# Patient Record
Sex: Female | Born: 1970 | Race: Black or African American | Hispanic: No | Marital: Single | Smoking: Current some day smoker
Health system: Southern US, Community
[De-identification: ages and names within clinical notes are randomized; demographics above are authoritative.]

## PROBLEM LIST (undated history)

## (undated) DIAGNOSIS — Z98891 History of uterine scar from previous surgery: Secondary | ICD-10-CM

## (undated) DIAGNOSIS — Z9851 Tubal ligation status: Secondary | ICD-10-CM

## (undated) DIAGNOSIS — G589 Mononeuropathy, unspecified: Secondary | ICD-10-CM

## (undated) DIAGNOSIS — Z5189 Encounter for other specified aftercare: Secondary | ICD-10-CM

## (undated) DIAGNOSIS — D509 Iron deficiency anemia, unspecified: Secondary | ICD-10-CM

## (undated) DIAGNOSIS — F419 Anxiety disorder, unspecified: Secondary | ICD-10-CM

## (undated) DIAGNOSIS — M19011 Primary osteoarthritis, right shoulder: Secondary | ICD-10-CM

## (undated) DIAGNOSIS — F32A Depression, unspecified: Secondary | ICD-10-CM

## (undated) DIAGNOSIS — F329 Major depressive disorder, single episode, unspecified: Secondary | ICD-10-CM

## (undated) DIAGNOSIS — R51 Headache: Secondary | ICD-10-CM

## (undated) DIAGNOSIS — Z9889 Other specified postprocedural states: Secondary | ICD-10-CM

## (undated) HISTORY — DX: Other specified postprocedural states: Z98.890

## (undated) HISTORY — DX: Primary osteoarthritis, right shoulder: M19.011

## (undated) HISTORY — PX: MRI: SHX5353

## (undated) HISTORY — DX: Headache: R51

## (undated) HISTORY — PX: OTHER SURGICAL HISTORY: SHX169

## (undated) HISTORY — DX: Mononeuropathy, unspecified: G58.9

## (undated) HISTORY — DX: Iron deficiency anemia, unspecified: D50.9

## (undated) HISTORY — PX: SHOULDER SURGERY: SHX246

## (undated) HISTORY — DX: Encounter for other specified aftercare: Z51.89

## (undated) HISTORY — PX: TUBAL LIGATION: SHX77

## (undated) HISTORY — DX: History of uterine scar from previous surgery: Z98.891

## (undated) HISTORY — PX: EYE SURGERY: SHX253

## (undated) HISTORY — DX: Tubal ligation status: Z98.51

---

## 1997-12-28 ENCOUNTER — Emergency Department (HOSPITAL_COMMUNITY): Admission: EM | Admit: 1997-12-28 | Discharge: 1997-12-28 | Payer: Self-pay | Admitting: Emergency Medicine

## 1998-02-11 ENCOUNTER — Emergency Department (HOSPITAL_COMMUNITY): Admission: EM | Admit: 1998-02-11 | Discharge: 1998-02-11 | Payer: Self-pay | Admitting: Emergency Medicine

## 1998-02-12 ENCOUNTER — Inpatient Hospital Stay (HOSPITAL_COMMUNITY): Admission: AD | Admit: 1998-02-12 | Discharge: 1998-02-15 | Payer: Self-pay | Admitting: Emergency Medicine

## 1998-11-24 ENCOUNTER — Encounter: Payer: Self-pay | Admitting: Emergency Medicine

## 1998-11-24 ENCOUNTER — Emergency Department (HOSPITAL_COMMUNITY): Admission: EM | Admit: 1998-11-24 | Discharge: 1998-11-24 | Payer: Self-pay | Admitting: Emergency Medicine

## 1999-07-01 ENCOUNTER — Encounter: Payer: Self-pay | Admitting: Emergency Medicine

## 1999-07-01 ENCOUNTER — Emergency Department (HOSPITAL_COMMUNITY): Admission: EM | Admit: 1999-07-01 | Discharge: 1999-07-01 | Payer: Self-pay | Admitting: Emergency Medicine

## 2000-12-25 ENCOUNTER — Emergency Department (HOSPITAL_COMMUNITY): Admission: EM | Admit: 2000-12-25 | Discharge: 2000-12-25 | Payer: Self-pay | Admitting: Emergency Medicine

## 2000-12-26 ENCOUNTER — Encounter: Payer: Self-pay | Admitting: Emergency Medicine

## 2000-12-26 ENCOUNTER — Emergency Department (HOSPITAL_COMMUNITY): Admission: EM | Admit: 2000-12-26 | Discharge: 2000-12-26 | Payer: Self-pay | Admitting: Emergency Medicine

## 2001-09-22 ENCOUNTER — Inpatient Hospital Stay (HOSPITAL_COMMUNITY): Admission: AD | Admit: 2001-09-22 | Discharge: 2001-09-22 | Payer: Self-pay | Admitting: Obstetrics

## 2002-01-18 ENCOUNTER — Ambulatory Visit (HOSPITAL_COMMUNITY): Admission: RE | Admit: 2002-01-18 | Discharge: 2002-01-18 | Payer: Self-pay | Admitting: Obstetrics

## 2002-01-18 ENCOUNTER — Encounter: Payer: Self-pay | Admitting: Obstetrics

## 2002-03-07 ENCOUNTER — Inpatient Hospital Stay (HOSPITAL_COMMUNITY): Admission: AD | Admit: 2002-03-07 | Discharge: 2002-03-10 | Payer: Self-pay | Admitting: Obstetrics

## 2002-03-07 ENCOUNTER — Encounter (INDEPENDENT_AMBULATORY_CARE_PROVIDER_SITE_OTHER): Payer: Self-pay | Admitting: Specialist

## 2004-01-08 ENCOUNTER — Emergency Department (HOSPITAL_COMMUNITY): Admission: EM | Admit: 2004-01-08 | Discharge: 2004-01-08 | Payer: Self-pay | Admitting: Emergency Medicine

## 2005-09-20 ENCOUNTER — Ambulatory Visit: Payer: Self-pay | Admitting: Family Medicine

## 2005-12-23 ENCOUNTER — Ambulatory Visit: Payer: Self-pay | Admitting: Nurse Practitioner

## 2005-12-26 ENCOUNTER — Ambulatory Visit: Payer: Self-pay | Admitting: Nurse Practitioner

## 2006-07-09 ENCOUNTER — Emergency Department (HOSPITAL_COMMUNITY): Admission: EM | Admit: 2006-07-09 | Discharge: 2006-07-09 | Payer: Self-pay | Admitting: Emergency Medicine

## 2009-07-07 ENCOUNTER — Ambulatory Visit: Payer: Self-pay | Admitting: Internal Medicine

## 2010-02-15 ENCOUNTER — Ambulatory Visit: Payer: Self-pay | Admitting: Internal Medicine

## 2010-02-17 ENCOUNTER — Ambulatory Visit (HOSPITAL_COMMUNITY): Admission: RE | Admit: 2010-02-17 | Discharge: 2010-02-17 | Payer: Self-pay | Admitting: Internal Medicine

## 2010-07-06 ENCOUNTER — Emergency Department (HOSPITAL_COMMUNITY)
Admission: EM | Admit: 2010-07-06 | Discharge: 2010-07-06 | Payer: Self-pay | Source: Home / Self Care | Admitting: Emergency Medicine

## 2010-12-10 NOTE — Discharge Summary (Signed)
   Annette Chang, SALTSMAN                       ACCOUNT NO.:  [id number]   MEDICAL RECORD NO.:  [id number]                   PATIENT TYPE:  INP   LOCATION:  9140                                 FACILITY:  [facility]   PHYSICIAN:  Kathreen Cosier, M.D.           DATE OF BIRTH:  12-15-1970   DATE OF ADMISSION:  03/07/2002  DATE OF DISCHARGE:  03/10/2002                                 DISCHARGE SUMMARY   HISTORY:  The patient is a 40 -year-old gravida 3, para 1-1-0-1 who had a  previous classical C-section and low transverse in the past.  Estimated date  of confinement was 03/25/02. She was admitted contracting cervix 1 cm, 80%  vertex, -3.  She was also treated for depression in the past.  She had a  repeat low transverse cesarean section delivering a 5 pound 10 ounce female  with Apgars 8 and 9.  Tubal ligation was also performed.  On admission her  hemoglobin was 12.1.  Platelets 1743.  Postoperative hemoglobin 11.2.  She  did well and was discharged home on the third postoperative day ambulatory  and a regular diet.   DISCHARGE DIAGNOSIS:  1. Status post previous C-section done at term.  2. Repeat C-section.  3. Tubal ligation.                                               Kathreen Cosier, M.D.    BAM/MEDQ  D:  04/10/2002  T:  04/10/2002  Job:  [id number]

## 2010-12-10 NOTE — Op Note (Signed)
   [name], Annette Chang                         ACCOUNT NO.:  [id number]   MEDICAL RECORD NO.:  [id number]                   PATIENT TYPE:   LOCATION:                                       FACILITY:   PHYSICIAN:  Kathreen Cosier, M.D.           DATE OF BIRTH:   DATE OF PROCEDURE:  03/07/2002  DATE OF DISCHARGE:                                 OPERATIVE REPORT   PREOPERATIVE DIAGNOSES:  Previous classical cesarean section in labor for  repeat cesarean section.   ANESTHESIA:  Spinal.   SURGEON:  Kathreen Cosier, M.D.   PROCEDURE:  After spinal administered patient placed on the operating table  in a supine position.  Abdomen prepped and draped.  Bladder emptied with a  Foley catheter.  A transverse lower uterine incision made through the old  scar, carried down to the rectus fascia.  Fascia cleaned and incised the  length of the incision.  Recti muscles retracted laterally.  Peritoneum  incised longitudinally.  Transverse incision made in the visceroperitoneum  above the bladder and bladder mobilized inferiorly.  The old scar was noted  to be intact.  Transverse incision made in the lower segment.  The fluid was  clear.  The patient delivered a female Apgar 8/9 weighing 5 pounds 10  ounces.  The placenta was anterior, removed manually.  Uterine cavity  cleaned with dry laps.  The uterine incision closed in one layer with  continuous suture of number 1 chromic.  Bladder flap reattached with 2-0  chromic.  The tubes and ovaries were normal.  The right tube was grasped in  the mid portion with a Babcock clamp.  A 0 plain suture placed in the  mesosalpinx below the portion of tube within the clamp.  This was tied and  approximately 1 inch of tube transected.  Procedure done in a similar  fashion on the other side.  Hemostasis satisfactory.  Blood loss 400 cc.  Abdomen closed in layers.  Peritoneum continuous suture of 0 chromic.  Fascia continuous suture of 0 Dexon.  Skin  closed with subcuticular stitch  of 3-0 plain.                                               Kathreen Cosier, M.D.    BAM/MEDQ  D:  05/15/2002  T:  05/15/2002  Job:  [id number]

## 2011-01-07 ENCOUNTER — Other Ambulatory Visit (HOSPITAL_COMMUNITY): Payer: Self-pay | Admitting: Family Medicine

## 2011-01-07 DIAGNOSIS — Z1231 Encounter for screening mammogram for malignant neoplasm of breast: Secondary | ICD-10-CM

## 2011-01-18 ENCOUNTER — Ambulatory Visit (HOSPITAL_COMMUNITY): Payer: Medicaid Other | Attending: Family Medicine

## 2011-12-20 ENCOUNTER — Emergency Department (HOSPITAL_COMMUNITY)
Admission: EM | Admit: 2011-12-20 | Discharge: 2011-12-20 | Disposition: A | Discharge status: Home / Self Care | Payer: Medicaid Other | Attending: Emergency Medicine | Admitting: Emergency Medicine

## 2011-12-20 ENCOUNTER — Encounter (HOSPITAL_COMMUNITY): Payer: Self-pay | Admitting: *Deleted

## 2011-12-20 DIAGNOSIS — R040 Epistaxis: Secondary | ICD-10-CM | POA: Insufficient documentation

## 2011-12-20 DIAGNOSIS — R51 Headache: Secondary | ICD-10-CM

## 2011-12-20 MED ORDER — DIPHENHYDRAMINE HCL 50 MG/ML IJ SOLN
12.5000 mg | Freq: Once | INTRAMUSCULAR | Status: AC
  Administered 2011-12-20: 12.5 mg via INTRAVENOUS
  Filled 2011-12-20: qty 1

## 2011-12-20 MED ORDER — METOCLOPRAMIDE HCL 5 MG/ML IJ SOLN
5.0000 mg | Freq: Once | INTRAMUSCULAR | Status: AC
  Administered 2011-12-20: 5 mg via INTRAVENOUS
  Filled 2011-12-20: qty 2

## 2011-12-20 MED ORDER — SODIUM CHLORIDE 0.9 % IV BOLUS (SEPSIS)
1000.0000 mL | INTRAVENOUS | Status: AC
  Administered 2011-12-20: 1000 mL via INTRAVENOUS

## 2011-12-20 MED ORDER — DEXAMETHASONE SODIUM PHOSPHATE 10 MG/ML IJ SOLN
10.0000 mg | Freq: Once | INTRAMUSCULAR | Status: AC
  Administered 2011-12-20: 10 mg via INTRAVENOUS
  Filled 2011-12-20: qty 1

## 2011-12-20 MED ORDER — ONDANSETRON 8 MG PO TBDP: ORAL_TABLET | ORAL | Status: AC

## 2011-12-20 MED ORDER — BUTALBITAL-APAP-CAFFEINE 50-325-40 MG PO TABS: 1.0000 | ORAL_TABLET | Freq: Four times a day (QID) | ORAL | Status: AC | PRN

## 2011-12-20 MED ORDER — METOCLOPRAMIDE HCL 10 MG PO TABS: 10.0000 mg | ORAL_TABLET | Freq: Four times a day (QID) | ORAL | Status: DC | PRN

## 2011-12-20 NOTE — ED Notes (Signed)
Pt undressed, in gown, on continuous pulse oximetry and blood pressure cuff; family at bedside 

## 2011-12-20 NOTE — ED Provider Notes (Signed)
History     CSN: [id number]  Arrival date & time 12/20/11  1025   None     Chief Complaint  Patient presents with  . Headache  . Epistaxis    (Consider location/radiation/quality/duration/timing/severity/associated sxs/prior treatment) Patient is a 41 y.o. female presenting with headaches and nosebleeds. The history is provided by the patient.  Headache  This is a new problem. Episode onset: 2 weeks ago. The problem occurs constantly. The problem has not changed since onset.The headache is associated with nothing. The pain is located in the frontal region. The quality of the pain is described as throbbing. The pain is moderate. The pain does not radiate. Associated symptoms include vomiting (once). Pertinent negatives include no fever, no shortness of breath and no nausea. Treatments tried: motrin. The treatment provided mild relief.  Epistaxis     History reviewed. No pertinent past medical history.  Past Surgical History  Procedure Date  . Shoulder surgery     No family history on file.  History  Substance Use Topics  . Smoking status: Current Everyday Smoker  . Smokeless tobacco: Not on file  . Alcohol Use: Yes     occ    OB History    Grav Para Term Preterm Abortions TAB SAB Ect Mult Living                  Review of Systems  Constitutional: Positive for chills. Negative for fever and fatigue.       Sweats  HENT: Positive for nosebleeds. Negative for congestion, drooling and neck pain.   Eyes: Negative for pain.  Respiratory: Negative for cough and shortness of breath.   Cardiovascular: Negative for chest pain.  Gastrointestinal: Positive for vomiting (once). Negative for nausea, abdominal pain and diarrhea.  Genitourinary: Negative for dysuria and hematuria.  Musculoskeletal: Negative for back pain and gait problem.  Skin: Negative for color change.  Neurological: Positive for headaches. Negative for dizziness.  Hematological: Negative for adenopathy.    Psychiatric/Behavioral: Negative for behavioral problems.  All other systems reviewed and are negative.    Allergies  Percocet  Home Medications   Current Outpatient Rx  Name Route Sig Dispense Refill  . ACETAMINOPHEN 500 MG PO TABS Oral Take 500 mg by mouth every 6 (six) hours as needed. For headache    . IBUPROFEN 800 MG PO TABS Oral Take 800 mg by mouth every 8 (eight) hours as needed. For headache      BP 124/92  Pulse 75  Temp(Src) 98.5 F (36.9 C) (Oral)  Resp 18  SpO2 98%  Physical Exam  Constitutional: She is oriented to person, place, and time. She appears well-developed and well-nourished.  HENT:  Head: Normocephalic.  Mouth/Throat: No oropharyngeal exudate.  Eyes: Conjunctivae and EOM are normal. Pupils are equal, round, and reactive to light.  Neck: Normal range of motion. Neck supple.       No nuchal rigidity. Negative kernigs/brudzinski signs.  Cardiovascular: Normal rate, regular rhythm, normal heart sounds and intact distal pulses.  Exam reveals no gallop and no friction rub.   No murmur heard. Pulmonary/Chest: Effort normal and breath sounds normal. No respiratory distress. She has no wheezes.  Abdominal: Soft. Bowel sounds are normal. There is no tenderness.  Musculoskeletal: Normal range of motion. She exhibits no edema and no tenderness.  Neurological: She is alert and oriented to person, place, and time. She has normal strength. No cranial nerve deficit or sensory deficit. She displays a negative Romberg sign. Coordination  and gait normal.       Mild dizziness w/ ambulation.  Skin: Skin is warm and dry.  Psychiatric: She has a normal mood and affect. Her behavior is normal.    ED Course  Procedures (including critical care time)  Labs Reviewed - No data to display No results found.   No diagnosis found.    MDM  12:00 PM 41 y.o. female pw HA x 2 weeks and nosebleed 2-3x. Pt notes chills and sweats, but denies fever. Pt has had emesis x1. Pt  AFVSS here, appears well on exam, neurologically intact, no meningeal signs. Will give HA cocktail.   Pt notes that HA has dramatically decreased, now 5/10.  She continues to appear well.  I have discussed the diagnosis/risks/treatment options with the patient and believe the pt to be eligible for discharge home to follow-up with pcp for her HA's. We also discussed returning to the ED immediately if new or worsening sx occur. We discussed the sx which are most concerning (e.g., fever, worsening HA) that necessitate immediate return. Any new prescriptions provided to the patient are listed below.  New Prescriptions   BUTALBITAL-ACETAMINOPHEN-CAFFEINE (FIORICET) 50-325-40 MG PER TABLET    Take 1 tablet by mouth every 6 (six) hours as needed for headache.   METOCLOPRAMIDE (REGLAN) 10 MG TABLET    Take 1 tablet (10 mg total) by mouth every 6 (six) hours as needed (nausea/headache).   ONDANSETRON (ZOFRAN ODT) 8 MG DISINTEGRATING TABLET    8mg  ODT q4 hours prn nausea     Clinical Impression 1. Headache          Purvis Sheffield, MD 12/20/11 1640

## 2011-12-20 NOTE — ED Provider Notes (Signed)
Feels much better, no sudden onset, no fever or trauma, denies focal neuro Sxs.  I saw and evaluated the patient, reviewed the resident's note and I agree with the findings and plan.  Hurman Horn, MD 12/21/11 [phone]

## 2011-12-20 NOTE — ED Notes (Signed)
Pt used phone and reconnected back to monitor, continuous pulse ox and blood pressure.

## 2011-12-20 NOTE — ED Notes (Signed)
Got pt an extra blanket for comfort.

## 2011-12-20 NOTE — Discharge Instructions (Signed)
 RESOURCE GUIDE  Dental Problems  Patients with Medicaid: [facility]                     [facility] [address]                                           [address] Phone:  [phone]                                                  Phone:  [phone]  If unable to pay or uninsured, contact:  [facility] or [facility] to become qualified for the adult dental clinic.  Chronic Pain Problems Contact [facility]  [phone] Patients need to be referred by their primary care doctor.  Insufficient Money for Medicine Contact [employer]:  call "211" or [facility] 271-5999.  No Primary Care Doctor Call [facility]  832-8000 Other agencies that provide inexpensive medical care    [facility]  832-8035    [facility]  [phone]    [facility]  [phone]    [facility] Clinic  [phone]    [facility]  [phone]    [facility]  [phone]  Psychological Services [facility]  [phone] [facility]  [phone] [facility]   800 853-5163 (emergency services 641-4993)  Substance Abuse Resources [facility]  336-882-2125 [facility] 336-784-9470 [facility] 336-285-9073 [facility] 336-845-3988 Residential & Outpatient Substance Abuse Program  800-659-3381  Abuse/Neglect [place] Child Abuse Hotline (336) 641-3795 [place] Child Abuse Hotline 800-378-5315 (After Hours)  Emergency Shelter [facility] (336) 271-5985  M[facility] (336) 275-9566 [facility] (704) 372-4663  MRSA Hotline #:   832-7006    [place] [facility]  [facility] [place]     [employer]                          [facility] [address] [place]                       [address]      [address]                                                  [place]                            [place] Phone:  [phone]                                   Phone:  [phone]                 Phone:  [phone]  [facility] Phone:    [phone]  [place] Child Abuse Hotline (336) 342-1394 (336) 342-3537 (After Hours)   

## 2011-12-20 NOTE — ED Notes (Signed)
Pt is here with headaches for 2 weeks.  Pt having night sweats.  Pt reports nose bleeds with headaches..  Pt reports intermittent dizziness. Pt reports fatigue

## 2012-01-02 ENCOUNTER — Ambulatory Visit (INDEPENDENT_AMBULATORY_CARE_PROVIDER_SITE_OTHER): Payer: Medicaid Other | Admitting: Family Medicine

## 2012-01-02 ENCOUNTER — Encounter: Payer: Self-pay | Admitting: Family Medicine

## 2012-01-02 VITALS — BP 130/84 | HR 69 | Ht 61.0 in | Wt 95.8 lb

## 2012-01-02 DIAGNOSIS — M19011 Primary osteoarthritis, right shoulder: Secondary | ICD-10-CM

## 2012-01-02 DIAGNOSIS — Z9889 Other specified postprocedural states: Secondary | ICD-10-CM

## 2012-01-02 DIAGNOSIS — M19019 Primary osteoarthritis, unspecified shoulder: Secondary | ICD-10-CM

## 2012-01-02 DIAGNOSIS — F329 Major depressive disorder, single episode, unspecified: Secondary | ICD-10-CM

## 2012-01-02 DIAGNOSIS — Z23 Encounter for immunization: Secondary | ICD-10-CM

## 2012-01-02 DIAGNOSIS — R51 Headache: Secondary | ICD-10-CM

## 2012-01-02 DIAGNOSIS — D509 Iron deficiency anemia, unspecified: Secondary | ICD-10-CM

## 2012-01-02 DIAGNOSIS — Z9851 Tubal ligation status: Secondary | ICD-10-CM

## 2012-01-02 DIAGNOSIS — Z98891 History of uterine scar from previous surgery: Secondary | ICD-10-CM

## 2012-01-02 DIAGNOSIS — R519 Headache, unspecified: Secondary | ICD-10-CM | POA: Insufficient documentation

## 2012-01-02 DIAGNOSIS — Z1239 Encounter for other screening for malignant neoplasm of breast: Secondary | ICD-10-CM

## 2012-01-02 DIAGNOSIS — F32A Depression, unspecified: Secondary | ICD-10-CM

## 2012-01-02 DIAGNOSIS — R61 Generalized hyperhidrosis: Secondary | ICD-10-CM

## 2012-01-02 HISTORY — DX: History of uterine scar from previous surgery: Z98.891

## 2012-01-02 HISTORY — DX: Iron deficiency anemia, unspecified: D50.9

## 2012-01-02 HISTORY — DX: Other specified postprocedural states: Z98.890

## 2012-01-02 HISTORY — DX: Primary osteoarthritis, right shoulder: M19.011

## 2012-01-02 HISTORY — DX: Tubal ligation status: Z98.51

## 2012-01-02 HISTORY — DX: Headache: R51

## 2012-01-02 NOTE — Assessment & Plan Note (Signed)
Will discuss this next visit. Recently went to ER because of this and was attributed to HTN, although her pressure was not extremely elevated.

## 2012-01-02 NOTE — Assessment & Plan Note (Signed)
History of surgery, every now and then she gets pain in this area.

## 2012-01-02 NOTE — Progress Notes (Signed)
  Subjective:   Patient ID: Annette Chang, female DOB: [birth year] 41 y.o. MRN: [id number] HPI: New patient C/o Headache  1. Headaches. Course: worsening Synopsis: patient developed a headache two weeks ago that she attributed to HTN. She was seen in the ED at that time and was given pain medication that helped. She has normal pressure today.  Plan is to discuss this problem next visit.   Patient Active Problem List  Diagnoses  . Headache  . H/O rotator cuff surgery  . Arthritis of shoulder region, right  . H/O: C-section  . Iron deficiency anemia  . H/O tubal ligation  . Depression   2. Depression PHQ-9 of 7. Mainly moodiness and excess sleep. She snaps at her children for no reason.  Does not have energy.  No hx of depression.  History  Substance Use Topics  . Smoking status: Current Everyday Smoker  . Smokeless tobacco: Not on file  . Alcohol Use: Yes     occ    Review of Systems: Pertinent items are noted in HPI.  Labs Reviewed: yes Reviewed Chart Review for last notes.     Objective:   Filed Vitals:   01/02/12 1559  BP: 130/84  Pulse: 69  Height: 5\' 1"  (1.549 m)  Weight: 95 lb 12.8 oz (43.455 kg)   Physical Exam: General: aaf, skinny, smiling, pleasant Lungs:  Normal respiratory effort, chest expands symmetrically. Lungs are clear to auscultation, no crackles or wheezes. Heart - Regular rate and rhythm.  No murmurs, gallops or rubs.    Abdomen: soft and non-tender without masses, organomegaly or hernias noted.  No guarding or rebound Extremities:   Non-tender, No cyanosis, edema, or deformity noted. Back - Normal skin, Spine with normal alignment and no deformity.  No tenderness to vertebral process palpation.  Paraspinous muscles are not tender and without spasm.   Range of motion is full at neck and lumbar sacral regions Skin:  Intact without suspicious lesions or rashes Assessment & Plan:

## 2012-01-02 NOTE — Patient Instructions (Signed)
Nice to meet you today.  I will see you again in two weeks to discuss your headaches and to do a PAP smear.

## 2012-01-02 NOTE — Assessment & Plan Note (Signed)
Obtaining a CBC. Patient states she has a history of anemia.

## 2012-01-02 NOTE — Assessment & Plan Note (Signed)
7 on her PHQ - mild depression.  Will follow this. She may benefit from an SSRI

## 2012-01-03 LAB — BASIC METABOLIC PANEL
BUN: 8 mg/dL (ref 6–23)
Calcium: 9.6 mg/dL (ref 8.4–10.5)
Creat: 0.64 mg/dL (ref 0.50–1.10)
Glucose, Bld: 89 mg/dL (ref 70–99)
Potassium: 3.6 mEq/L (ref 3.5–5.3)

## 2012-01-03 LAB — CBC
Platelets: 211 10*3/uL (ref 150–400)
RBC: 4.2 MIL/uL (ref 3.87–5.11)
RDW: 13.9 % (ref 11.5–15.5)
WBC: 4.5 10*3/uL (ref 4.0–10.5)

## 2012-01-03 LAB — TSH: TSH: 1.576 u[IU]/mL (ref 0.350–4.500)

## 2012-01-18 ENCOUNTER — Ambulatory Visit: Payer: Medicaid Other | Admitting: Family Medicine

## 2012-02-06 ENCOUNTER — Ambulatory Visit: Payer: Medicaid Other | Admitting: Family Medicine

## 2012-02-07 ENCOUNTER — Ambulatory Visit: Payer: Medicaid Other

## 2012-03-05 ENCOUNTER — Encounter: Payer: Medicaid Other | Admitting: Family Medicine

## 2012-05-03 ENCOUNTER — Encounter: Payer: Self-pay | Admitting: Family Medicine

## 2012-05-03 ENCOUNTER — Other Ambulatory Visit (HOSPITAL_COMMUNITY)
Admission: RE | Admit: 2012-05-03 | Discharge: 2012-05-03 | Disposition: A | Discharge status: Home / Self Care | Payer: Medicaid Other | Source: Ambulatory Visit | Attending: Family Medicine | Admitting: Family Medicine

## 2012-05-03 ENCOUNTER — Ambulatory Visit (INDEPENDENT_AMBULATORY_CARE_PROVIDER_SITE_OTHER): Payer: Medicaid Other | Admitting: Family Medicine

## 2012-05-03 VITALS — BP 124/76 | HR 70 | Ht 61.0 in | Wt 96.2 lb

## 2012-05-03 DIAGNOSIS — Z124 Encounter for screening for malignant neoplasm of cervix: Secondary | ICD-10-CM

## 2012-05-03 DIAGNOSIS — M21619 Bunion of unspecified foot: Secondary | ICD-10-CM

## 2012-05-03 DIAGNOSIS — Z01419 Encounter for gynecological examination (general) (routine) without abnormal findings: Secondary | ICD-10-CM | POA: Insufficient documentation

## 2012-05-03 DIAGNOSIS — N920 Excessive and frequent menstruation with regular cycle: Secondary | ICD-10-CM

## 2012-05-03 NOTE — Progress Notes (Signed)
  Subjective:     Annette Chang is a 41 y.o. woman who comes in today for a  pap smear only. Her most recent annual exam was about 10 years ago.  She is a new patient and we do not have records of last pap smear.  It was performed when she was pregnant about 10 years ago.  Contraception: tubal ligation  Bunion: located on both feet.  At times, they will become red, swollen, and painful when she wears tight shoes.  Patient starting a new job and would like them shaved down.  Her mother has had the procedure done before.  She can bear weight on it.  Denies any associated fever, chills, N/V.  Review of Systems Per HPI  Objective:    BP 124/76  Pulse 70  Ht 5\' 1"  (1.549 m)  Wt 96 lb 3.2 oz (43.636 kg)  BMI 18.18 kg/m2 Pelvic Exam: cervix normal in appearance, external genitalia normal, no adnexal masses or tenderness, no cervical motion tenderness and vagina normal without discharge. Pap smear obtained.   Assessment:    Screening pap smear.   Plan:   See Problem List.   Follow up in 1 year for annual CPE, or as indicated by Pap results.

## 2012-05-03 NOTE — Assessment & Plan Note (Signed)
Will refer to podiatry for possible removal.  Advised patient to purchase comfortable shoes to keep pressure off bunion and to take NSAIDS as needed for pain/swelling.

## 2012-05-03 NOTE — Assessment & Plan Note (Signed)
Pap performed today. 

## 2012-05-07 ENCOUNTER — Encounter: Payer: Self-pay | Admitting: Obstetrics & Gynecology

## 2012-05-09 ENCOUNTER — Encounter: Payer: Self-pay | Admitting: Family Medicine

## 2012-05-10 ENCOUNTER — Telehealth: Payer: Self-pay | Admitting: Family Medicine

## 2012-05-10 MED ORDER — METRONIDAZOLE 500 MG PO TABS: 500.0000 mg | ORAL_TABLET | Freq: Three times a day (TID) | ORAL | Status: DC

## 2012-05-10 NOTE — Telephone Encounter (Signed)
Pt's pap was normal however she is positive for trichomonas. Will forward to pcp to find out plan for treatment prior to calling pt back.Laureen Ochs, Viann Shove

## 2012-05-10 NOTE — Telephone Encounter (Signed)
Patient is calling for results of Pap Smear.  She said that if she doesn't answer, it is ok to leave the results on her answering machine.

## 2012-05-10 NOTE — Telephone Encounter (Signed)
I sent Flagyl to her pharmacy.  Thanks for letting me know, Tonya.

## 2012-05-14 NOTE — Telephone Encounter (Signed)
Called to inform pt of POS Trichomonas however pt was unavailable so I left a message with Angelique Blonder her neighbors daughter for her to call our office back. (did not tell her of pt's results)    Should pt call our office back please inform her of the following:  She tested POS for Trichomonas. Dr.de Lawson Radar sent in an Rx for Flagyl to her pharmacy. She will need to take the full course of antibiotic  She will need to inform her partner so that they can get tested and treated NO SEX for 7 days.  Laureen Ochs, Viann Shove

## 2012-05-15 NOTE — Telephone Encounter (Signed)
Called pt and spoke with Alona Bene (Pt's neighbor) she stated that her daughter had given her the message to call our office back. She informed me that the pt recently had surgery and was not able to get out of her home. She told me that she will give her the message again and hopefully when someone goes by to check on her they have a cell phone so that pt can call back. I will forward this information to her pcp.Loralee Pacas [place]

## 2012-05-28 ENCOUNTER — Encounter: Payer: Medicaid Other | Admitting: Obstetrics & Gynecology

## 2012-09-03 ENCOUNTER — Ambulatory Visit: Payer: Medicaid Other | Admitting: Family Medicine

## 2012-09-12 ENCOUNTER — Ambulatory Visit: Payer: Medicaid Other | Admitting: Family Medicine

## 2012-10-10 ENCOUNTER — Ambulatory Visit (INDEPENDENT_AMBULATORY_CARE_PROVIDER_SITE_OTHER): Payer: Medicaid Other | Admitting: Family Medicine

## 2012-10-10 ENCOUNTER — Encounter: Payer: Self-pay | Admitting: Family Medicine

## 2012-10-10 VITALS — BP 109/71 | HR 60 | Temp 98.2°F | Ht 61.0 in | Wt 96.1 lb

## 2012-10-10 DIAGNOSIS — Z1239 Encounter for other screening for malignant neoplasm of breast: Secondary | ICD-10-CM

## 2012-10-10 DIAGNOSIS — N63 Unspecified lump in unspecified breast: Secondary | ICD-10-CM

## 2012-10-10 NOTE — Patient Instructions (Addendum)
Please call [facility] and schedule mammogram at your earliest convenience. Continue to do self breast exams in the shower once per month.

## 2012-10-10 NOTE — Addendum Note (Signed)
Addended by: Damita Lack on: 10/10/2012 02:53 PM   Modules accepted: Orders

## 2012-10-10 NOTE — Assessment & Plan Note (Addendum)
Needs new referral for screening mammogram.  Low risk patient.  No palpable mass found on physical exam today.  Patient to call [facility] for appointment.

## 2012-10-10 NOTE — Progress Notes (Signed)
  Subjective:    Patient ID: Annette Chang, female    DOB: 02/20/1971, 42 y.o.   MRN: [id number]  HPI  Patient here for referral for mammogram (screening). She has felt a knot on left breast in the past, but she says it comes and goes. Knot is painful intermittently, mostly when she lays on it at night.  No first degree family hx of breast cancer. She smokes cigarettes infrequently (one cigarette socially on weekends). Menarche: 42 years old; she is a mother of 2 children.  Denies any fever, chills, NS, nausea/vomiting.  Review of Systems Per HPI    Objective:   Physical Exam  Constitutional: She appears well-nourished. No distress.  Pulmonary/Chest: Right breast exhibits no mass, no nipple discharge, no skin change and no tenderness. Left breast exhibits no mass, no nipple discharge, no skin change and no tenderness. Breasts are asymmetrical.  No masses, cysts, or abscess palpated on breast exam today.     Assessment & Plan:

## 2012-10-30 ENCOUNTER — Ambulatory Visit
Admission: RE | Admit: 2012-10-30 | Discharge: 2012-10-30 | Disposition: A | Discharge status: Home / Self Care | Payer: Self-pay | Source: Ambulatory Visit | Attending: Family Medicine | Admitting: Family Medicine

## 2012-10-30 ENCOUNTER — Ambulatory Visit: Payer: Medicaid Other

## 2012-10-30 DIAGNOSIS — N63 Unspecified lump in unspecified breast: Secondary | ICD-10-CM

## 2012-10-30 DIAGNOSIS — Z1239 Encounter for other screening for malignant neoplasm of breast: Secondary | ICD-10-CM

## 2012-10-31 ENCOUNTER — Telehealth (HOSPITAL_COMMUNITY): Payer: Self-pay | Admitting: *Deleted

## 2012-10-31 NOTE — Telephone Encounter (Signed)
Telephoned patient at home # and left message to return call to [facility] 

## 2012-11-05 ENCOUNTER — Other Ambulatory Visit: Payer: Self-pay | Admitting: *Deleted

## 2012-11-05 DIAGNOSIS — N63 Unspecified lump in unspecified breast: Secondary | ICD-10-CM

## 2012-11-06 ENCOUNTER — Encounter (HOSPITAL_COMMUNITY): Payer: Self-pay

## 2012-11-06 ENCOUNTER — Ambulatory Visit (HOSPITAL_COMMUNITY)
Admission: RE | Admit: 2012-11-06 | Discharge: 2012-11-06 | Disposition: A | Discharge status: Home / Self Care | Payer: Self-pay | Source: Ambulatory Visit | Attending: Obstetrics and Gynecology | Admitting: Obstetrics and Gynecology

## 2012-11-06 VITALS — BP 106/64 | Temp 99.0°F | Ht 61.0 in | Wt 95.2 lb

## 2012-11-06 DIAGNOSIS — N644 Mastodynia: Secondary | ICD-10-CM | POA: Insufficient documentation

## 2012-11-06 DIAGNOSIS — Z1239 Encounter for other screening for malignant neoplasm of breast: Secondary | ICD-10-CM

## 2012-11-06 NOTE — Patient Instructions (Addendum)
Taught patient how to perform BSE. Patient did not need a Pap smear today due to last Pap smear was 05/03/2012. Let her know [facility] will cover Pap smears every 3 years unless has a history of abnormal Pap smears.Told patient about free cervical cancer screenings to receive a Pap smear if would like one next year. Referred patient to the [facility] for bilateral diagnostic mammogram and ultrasound. Appointment scheduled for Monday, November 12, 2012 at 1020. Patient aware of appointment and will be there. Patient verbalized understanding.

## 2012-11-06 NOTE — Progress Notes (Signed)
Complaints of bilateral outer breast pain that is great within the left breast. Patient stated pain comes and goes. Patient stated pain is sharp when touches and when lays down. Patient rated pain at a 7 out of 10. Patient complained of lumps in bilateral breasts.  Pap Smear:    Pap smear not completed today. Last Pap smear was 05/03/2012 at [facility] and normal showing trichomonas vaginalis. Per patient has a history of an abnormal Pap smear around 27 years ago that is unsure if and what follow was completed. Last Pap smear result is in EPIC.  Physical exam: Breasts Breasts symmetrical. No skin abnormalities bilateral breasts. No nipple retraction bilateral breasts. No nipple discharge bilateral breasts. No lymphadenopathy. No lumps palpated bilateral breasts.  Complaints of bilateral outer breast pain that is greater in the left breast. Referred patient to the [facility] for bilateral diagnostic mammogram and ultrasound. Appointment scheduled for Monday, November 12, 2012 at 1020.     Pelvic/Bimanual No Pap smear completed today since last Pap smear was 05/03/2012. Pap smear not indicated per [facility] guidelines.

## 2012-11-12 ENCOUNTER — Ambulatory Visit
Admission: RE | Admit: 2012-11-12 | Discharge: 2012-11-12 | Disposition: A | Discharge status: Home / Self Care | Payer: No Typology Code available for payment source | Source: Ambulatory Visit | Attending: Family Medicine | Admitting: Family Medicine

## 2012-11-12 DIAGNOSIS — Z1239 Encounter for other screening for malignant neoplasm of breast: Secondary | ICD-10-CM

## 2012-11-12 DIAGNOSIS — N63 Unspecified lump in unspecified breast: Secondary | ICD-10-CM

## 2012-11-30 ENCOUNTER — Ambulatory Visit: Payer: Self-pay | Admitting: Family Medicine

## 2013-02-04 ENCOUNTER — Telehealth: Payer: Self-pay | Admitting: Family Medicine

## 2013-02-06 NOTE — Telephone Encounter (Signed)
error 

## 2013-02-12 ENCOUNTER — Encounter: Payer: Self-pay | Admitting: Family Medicine

## 2013-02-12 ENCOUNTER — Ambulatory Visit (INDEPENDENT_AMBULATORY_CARE_PROVIDER_SITE_OTHER): Payer: Medicaid Other | Admitting: Family Medicine

## 2013-02-12 VITALS — BP 109/76 | HR 81 | Ht 61.0 in | Wt 93.0 lb

## 2013-02-12 DIAGNOSIS — N644 Mastodynia: Secondary | ICD-10-CM

## 2013-02-12 DIAGNOSIS — N920 Excessive and frequent menstruation with regular cycle: Secondary | ICD-10-CM

## 2013-02-12 NOTE — Assessment & Plan Note (Signed)
Mammogram came back normal so U/S was cancelled. The only advice was follow up with yearly mammogram and consider 3-D mammogram.

## 2013-02-12 NOTE — Progress Notes (Signed)
Patient ID: Annette Chang, female   DOB: 08/20/1970, 42 y.o.   MRN: [id number] [facility] Clare Gandy, MD Phone: [phone]  Subjective:   F/U to mammogram:  She said that she received a letter about her mammogram that stated she has a 60% chance of developing cancer. The nurse called the facility where her mammogram was done and they never sent such a letter. Her mammogram was normal so a ultrasound was cancelled. The only advice they gave was yearly mammogram and considering a 3-D mammogram.   Menorrhagia  She asked for a referral for a partial hysterectomy. She started menses at the age of 34. She has menses monthly that can last from a week to two weeks. She can go through a whole box when at its worst. She has taken ibuprofen for pain but has not tried anything else. Her mother had a partial hysterectomy and her aunts have heavy menses.    ROS--See HPI  Past Medical History Reviewed problem list.  Medications- reviewed and updated Chief complaint-noted  Objective: BP 109/76  Pulse 81  Ht 5\' 1"  (1.549 m)  Wt 42.185 kg (93 lb)  BMI 17.58 kg/m2 Gen: NAD, resting comfortably CV: RRR no murmurs rubs or gallops Lungs: CTAB no crackles, wheeze, rhonchi Skin: warm, dry Neuro: grossly normal, moves all extremities  Assessment/Plan:  Menorrhagia: She left before I came back into the room.  -CBC and Urine pregnancy test - future orders - Try naproxen three times a day, start the NSAID the day before menses is to begin and then take it the first three days of menses. I want to try this for 2-3 cycles  - I also want to her to visit Dr[facility] for possible endometrial biopsy  - If NSAID doesn't work then we can try some birth control or an IUD  - if all this fails then we can refer for surgery.

## 2013-02-12 NOTE — Patient Instructions (Addendum)
Nice to meet you.   I would like to try some NSAIDS before you immediately consider surgery. I want to try naproxen three times a day. I want you to start it the day before the onset of menstruation and take it the first three days of menstruation. I want to try this for at least 3 cycles. If this does not work then I want to try oral contraceptives or an IUD. We can also refer you to Dr. Donnetta H[facility] women's/Gyn clinic in the family medicine office where they can further investigate your heavy bleeding.   Thank you and nice to meet you.

## 2013-02-20 ENCOUNTER — Telehealth: Payer: Self-pay | Admitting: Family Medicine

## 2013-02-20 NOTE — Telephone Encounter (Signed)
Pt is requesting  Status update on her referral for GYN. JW

## 2013-02-20 NOTE — Telephone Encounter (Signed)
In my progress note from her last clinic visit, I laid out the plan in order for her to try different options before being sent directly to GYN for surgery.

## 2013-02-21 NOTE — Telephone Encounter (Signed)
Pt informed of Dr. Jordan Likes plan and scheduled 03/07/13 at 11:30 in colpo clinic.  Pt verbalized understanding.  Haddie Bruhl, Darlyne Russian, CMA

## 2013-03-07 ENCOUNTER — Ambulatory Visit: Payer: Medicaid Other

## 2013-08-09 ENCOUNTER — Ambulatory Visit (INDEPENDENT_AMBULATORY_CARE_PROVIDER_SITE_OTHER): Payer: Medicaid Other | Admitting: Family Medicine

## 2013-08-09 ENCOUNTER — Encounter: Payer: Self-pay | Admitting: Family Medicine

## 2013-08-09 VITALS — BP 110/60 | HR 76 | Temp 98.6°F | Ht 61.0 in | Wt 92.8 lb

## 2013-08-09 DIAGNOSIS — N926 Irregular menstruation, unspecified: Secondary | ICD-10-CM

## 2013-08-09 DIAGNOSIS — R5381 Other malaise: Secondary | ICD-10-CM

## 2013-08-09 DIAGNOSIS — R5383 Other fatigue: Principal | ICD-10-CM

## 2013-08-09 DIAGNOSIS — N920 Excessive and frequent menstruation with regular cycle: Secondary | ICD-10-CM

## 2013-08-09 LAB — POCT GLYCOSYLATED HEMOGLOBIN (HGB A1C): HEMOGLOBIN A1C: 5

## 2013-08-09 LAB — CBC
HEMATOCRIT: 29.8 % — AB (ref 36.0–46.0)
HEMOGLOBIN: 10.1 g/dL — AB (ref 12.0–15.0)
MCH: 28.6 pg (ref 26.0–34.0)
MCHC: 33.9 g/dL (ref 30.0–36.0)
MCV: 84.4 fL (ref 78.0–100.0)
Platelets: 223 10*3/uL (ref 150–400)
RBC: 3.53 MIL/uL — ABNORMAL LOW (ref 3.87–5.11)
RDW: 15 % (ref 11.5–15.5)
WBC: 3.8 10*3/uL — ABNORMAL LOW (ref 4.0–10.5)

## 2013-08-09 LAB — POCT URINE PREGNANCY: PREG TEST UR: NEGATIVE

## 2013-08-09 NOTE — Patient Instructions (Signed)
Thank you for coming in,   I put in for a transvaginal ultrasound and a pelvic ultrasound. These two tests will look for anything causing your irregular bleeding. I will call you with these results as well as the lab results.   We will decide the course based on the results above.    Please feel free to call with any questions or concerns at any time, at [phone]. --Dr. Raeford Razor

## 2013-08-09 NOTE — Progress Notes (Signed)
    Subjective:     Patient ID: Annette Chang, female   DOB: 03/16/1971, 43 y.o.   MRN: [id number]  HPI Ms. Schuler is presenting for an annual check up.   Reports menorrhagia all of her life. She reports having irregular menses. She just finished her last menstrual cycle the day prior to appointment.  She has cycles that last 2-4 weeks. She will use 5-6 pads per day. She has always had these types of menses. She started her menses at 43 year of age. She reports all the females in her family have experienced cycles such as this. As of late she has been feeling more tired. She hasn't tried anything to date and only takes fish oil.  She has no excessive bruising or epistaxis. She does have palpitations and feels an intolerance to heat. The other women in her family have had partial hysterectomies.   Review of Systems All other systems reviewed and otherwise normal.      Objective:   Physical Exam BP 110/60  Pulse 76  Temp(Src) 98.6 F (37 C) (Oral)  Ht 5\' 1"  (1.549 m)  Wt 92 lb 12.8 oz (42.094 kg)  BMI 17.54 kg/m2  LMP 07/25/2013 Gen: NAD, alert, thin, African American female, cooperative with exam CV: RRR, good S1/S2, no murmur Resp: CTABL, no wheezes, non-labored Abd: SNTND, BS present, no guarding or organomegaly Ext: No edema, warm, Cap refill brisk      Assessment:         Plan:

## 2013-08-10 LAB — TSH: TSH: 0.294 u[IU]/mL — ABNORMAL LOW (ref 0.350–4.500)

## 2013-08-11 ENCOUNTER — Telehealth: Payer: Self-pay | Admitting: Family Medicine

## 2013-08-11 DIAGNOSIS — E0590 Thyrotoxicosis, unspecified without thyrotoxic crisis or storm: Secondary | ICD-10-CM

## 2013-08-11 DIAGNOSIS — D649 Anemia, unspecified: Secondary | ICD-10-CM

## 2013-08-11 NOTE — Telephone Encounter (Signed)
Patient had low TSH. Will call and put future lab draws of Free T4 and T3 for further work up of hyperthyroidism.

## 2013-08-12 NOTE — Telephone Encounter (Signed)
Spoke with female. Patient is very sick and was informed of below message

## 2013-08-13 ENCOUNTER — Encounter: Payer: Self-pay | Admitting: Family Medicine

## 2013-08-13 ENCOUNTER — Ambulatory Visit (HOSPITAL_COMMUNITY)
Admission: RE | Admit: 2013-08-13 | Discharge: 2013-08-13 | Disposition: A | Discharge status: Home / Self Care | Payer: Medicaid Other | Source: Ambulatory Visit | Attending: Family Medicine | Admitting: Family Medicine

## 2013-08-13 DIAGNOSIS — N938 Other specified abnormal uterine and vaginal bleeding: Secondary | ICD-10-CM | POA: Insufficient documentation

## 2013-08-13 DIAGNOSIS — N949 Unspecified condition associated with female genital organs and menstrual cycle: Secondary | ICD-10-CM | POA: Insufficient documentation

## 2013-08-13 DIAGNOSIS — N920 Excessive and frequent menstruation with regular cycle: Secondary | ICD-10-CM | POA: Insufficient documentation

## 2013-08-13 DIAGNOSIS — N925 Other specified irregular menstruation: Secondary | ICD-10-CM | POA: Insufficient documentation

## 2013-08-13 DIAGNOSIS — R5381 Other malaise: Secondary | ICD-10-CM | POA: Insufficient documentation

## 2013-08-13 DIAGNOSIS — D251 Intramural leiomyoma of uterus: Secondary | ICD-10-CM | POA: Insufficient documentation

## 2013-08-13 DIAGNOSIS — R5383 Other fatigue: Principal | ICD-10-CM

## 2013-08-13 NOTE — Assessment & Plan Note (Signed)
Has been feeling fatigue as of late. May be due to her menorrhagia.  - CBC, TSH, work up pending lab results.

## 2013-08-13 NOTE — Assessment & Plan Note (Signed)
Will evaluate for excessive bleeding. Transvaginal u/s and pelvic u/s. Appears to have a family pattern. She would like a partial hysterectomy. Will complete evaluation until referral is warranted.

## 2013-08-16 ENCOUNTER — Telehealth: Payer: Self-pay | Admitting: *Deleted

## 2013-08-16 NOTE — Telephone Encounter (Signed)
Message copied by Johny Shears on Fri Aug 16, 2013  8:49 AM ------      Message from: Clearance Coots E      Created: Thu Aug 15, 2013  9:51 PM       Please call Ms. Annette Chang and let her know that her transvaginal ultrasound and pelvic ultrasound were normal. There were no findings to explain her heavy periods.  I will refer her to an OB for further evaluation. Is there anyone that she prefers to go to? ------

## 2013-08-16 NOTE — Telephone Encounter (Signed)
LVM for patient to call back. ?

## 2013-08-19 NOTE — Telephone Encounter (Signed)
Spoke with patient's mother and gave her information. ok'ed by patient in office visit

## 2013-08-22 ENCOUNTER — Ambulatory Visit: Payer: Medicaid Other | Admitting: Family Medicine

## 2013-11-02 ENCOUNTER — Encounter (HOSPITAL_COMMUNITY): Payer: Self-pay | Admitting: *Deleted

## 2013-11-02 ENCOUNTER — Inpatient Hospital Stay (HOSPITAL_COMMUNITY)
Admission: AD | Admit: 2013-11-02 | Discharge: 2013-11-02 | Disposition: A | Discharge status: Home / Self Care | Payer: Medicaid Other | Source: Ambulatory Visit | Attending: Obstetrics & Gynecology | Admitting: Obstetrics & Gynecology

## 2013-11-02 DIAGNOSIS — N390 Urinary tract infection, site not specified: Secondary | ICD-10-CM | POA: Insufficient documentation

## 2013-11-02 DIAGNOSIS — Z9851 Tubal ligation status: Secondary | ICD-10-CM

## 2013-11-02 DIAGNOSIS — N920 Excessive and frequent menstruation with regular cycle: Secondary | ICD-10-CM | POA: Insufficient documentation

## 2013-11-02 HISTORY — DX: Depression, unspecified: F32.A

## 2013-11-02 HISTORY — DX: Major depressive disorder, single episode, unspecified: F32.9

## 2013-11-02 LAB — CBC
HCT: 29.2 % — ABNORMAL LOW (ref 36.0–46.0)
HEMOGLOBIN: 9.9 g/dL — AB (ref 12.0–15.0)
MCH: 28.9 pg (ref 26.0–34.0)
MCHC: 33.9 g/dL (ref 30.0–36.0)
MCV: 85.4 fL (ref 78.0–100.0)
Platelets: 210 10*3/uL (ref 150–400)
RBC: 3.42 MIL/uL — AB (ref 3.87–5.11)
RDW: 14.2 % (ref 11.5–15.5)
WBC: 3.8 10*3/uL — ABNORMAL LOW (ref 4.0–10.5)

## 2013-11-02 LAB — URINE MICROSCOPIC-ADD ON

## 2013-11-02 LAB — URINALYSIS, ROUTINE W REFLEX MICROSCOPIC
BILIRUBIN URINE: NEGATIVE
Glucose, UA: 100 mg/dL — AB
Ketones, ur: 40 mg/dL — AB
NITRITE: POSITIVE — AB
PH: 5 (ref 5.0–8.0)
Protein, ur: >300 mg/dL — AB
SPECIFIC GRAVITY, URINE: 1.02 (ref 1.005–1.030)
Urobilinogen, UA: >8 mg/dL — ABNORMAL HIGH (ref 0.0–1.0)

## 2013-11-02 MED ORDER — MEGESTROL ACETATE 40 MG PO TABS: ORAL_TABLET | ORAL | Status: DC

## 2013-11-02 MED ORDER — SULFAMETHOXAZOLE-TMP DS 800-160 MG PO TABS: 1.0000 | ORAL_TABLET | Freq: Two times a day (BID) | ORAL | Status: DC

## 2013-11-02 NOTE — MAU Note (Addendum)
States she has been bleeding heavily with clots for 2 weeks. States she has a hx of abnormal bleeding and has received blood transfusions. Has appointment with [facility] Tuesday but didn't feel she could wait. States she has not had good follow-up with new doctor. States she was told she had something small on one of her ovaries.

## 2013-11-02 NOTE — MAU Provider Note (Signed)
Chief Complaint: Vaginal Bleeding    SUBJECTIVE HPI: Annette Chang is a 43 y.o. [id number] who presents with heavy vaginal bleeding with clots for 2 weeks. Denies dysuria.  States she has a hx of abnormal bleeding and has received blood transfusions. Patient has an appointment with [facility] Tuesday at 4pm, but didn't feel she could wait. States she has not had good follow-up with new doctor (states did not get a call to go over her results).  Wants a hysterectomy.  Previous ultrasound (1/15) shows a small fibroid (<1cm).   Past Medical History  Diagnosis Date  . 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 H/O rotator cuff surgery 01/02/2012  . H/O tubal ligation 01/02/2012  . H/O: C-section 01/02/2012    3   . Headache(784.0) 01/02/2012  . Iron deficiency anemia 01/02/2012  . H/O: C-section 01/02/2012    3   . Depression    OB History  Gravida Para Term Preterm AB SAB TAB Ectopic Multiple Living  3 3 3       2     # Outcome Date GA Lbr Len/2nd Weight Sex Delivery Anes PTL Lv  3 TRM           2 TRM           1 TRM              Past Surgical History  Procedure Laterality Date  . Shoulder surgery    . Cesarean section      3 previous c sections  . Rotator cuff surgery    . Tubal ligation     History   Social History  . Marital Status: Single    Spouse Name: N/A    Number of Children: N/A  . Years of Education: N/A   Occupational History  . Not on file.   Social History Main Topics  . Smoking status: Never Smoker   . Smokeless tobacco: Never Used  . Alcohol Use: Yes     Comment: weekends  . Drug Use: No  . Sexual Activity: Not Currently    Birth Control/ Protection: Surgical   Other Topics Concern  . Not on file   Social History Narrative  . No narrative on file   No current facility-administered medications on file prior to encounter.   Current Outpatient Prescriptions on File Prior to Encounter  Medication Sig Dispense Refill  . acetaminophen (TYLENOL) 500  MG tablet Take 500 mg by mouth every 6 (six) hours as needed. For headache       Allergies  Allergen Reactions  . Percocet [Oxycodone-Acetaminophen] Itching    ROS: Pertinent items in HPI  OBJECTIVE Blood pressure 119/74, pulse 78, temperature 98.3 F (36.8 C), temperature source Oral, resp. rate 18, height 5\' 1"  (1.549 m), weight 42.094 kg (92 lb 12.8 oz), last menstrual period 10/27/2013. GENERAL: Well-developed, well-nourished female in no acute distress.  HEENT: Normocephalic HEART: normal rate RESP: normal effort ABDOMEN: Soft, non-tender EXTREMITIES: Nontender, no edema NEURO: Alert and oriented SPECULUM EXAM: Vaginal bleeding with tiny visible clot at os,, no active bleeding now; cervix is nulliparous and swollen, NEFG  BIMANUAL:Cervix is non-tender; uterus feels enlarged, no adnexal tenderness or masses  LAB RESULTS Results for orders placed during the hospital encounter of 11/02/13 (from the past 24 hour(s))  URINALYSIS, ROUTINE W REFLEX MICROSCOPIC     Status: Abnormal   Collection Time    11/02/13 10:48 AM      Result Value Ref Range  Color, Urine RED (*) YELLOW   APPearance TURBID (*) CLEAR   Specific Gravity, Urine 1.020  1.005 - 1.030   pH 5.0  5.0 - 8.0   Glucose, UA 100 (*) NEGATIVE mg/dL   Hgb urine dipstick LARGE (*) NEGATIVE   Bilirubin Urine NEGATIVE  NEGATIVE   Ketones, ur 40 (*) NEGATIVE mg/dL   Protein, ur >300 (*) NEGATIVE mg/dL   Urobilinogen, UA >8.0 (*) 0.0 - 1.0 mg/dL   Nitrite POSITIVE (*) NEGATIVE   Leukocytes, UA MODERATE (*) NEGATIVE  URINE MICROSCOPIC-ADD ON     Status: Abnormal   Collection Time    11/02/13 10:48 AM      Result Value Ref Range   Squamous Epithelial / LPF FEW (*) RARE   WBC, UA 3-6  <3 WBC/hpf   RBC / HPF TOO NUMEROUS TO COUNT  <3 RBC/hpf   Bacteria, UA RARE  RARE   Urine-Other MUCOUS PRESENT    CBC     Status: Abnormal   Collection Time    11/02/13 11:12 AM      Result Value Ref Range   WBC 3.8 (*) 4.0 - 10.5  K/uL   RBC 3.42 (*) 3.87 - 5.11 MIL/uL   Hemoglobin 9.9 (*) 12.0 - 15.0 g/dL   HCT 29.2 (*) 36.0 - 46.0 %   MCV 85.4  78.0 - 100.0 fL   MCH 28.9  26.0 - 34.0 pg   MCHC 33.9  30.0 - 36.0 g/dL   RDW 14.2  11.5 - 15.5 %   Platelets 210  150 - 400 K/uL    IMAGING No results found.  MAU COURSE Speculum exam  Bimanual exam  ASSESSMENT:   Menorrhagia UTI  PLAN Discharge home in stable condition Keep next scheduled visit with [facility] on Tuesday     Follow-up Information   Follow up with [facility] In 2 days. (Keep scheduled appt for 5/13 at 4pm)    Contact information:   [address] [place] [address] [phone]       Medication List         acetaminophen 500 MG tablet  Commonly known as:  TYLENOL  Take 500 mg by mouth every 6 (six) hours as needed. For headache     calcium-vitamin D 500-200 MG-UNIT per tablet  Take 1 tablet by mouth daily.     FISH OIL PO  Take 1 capsule by mouth daily.     megestrol 40 MG tablet  Commonly known as:  MEGACE  Take 3/day (at the same time) for 5 days; 2/day for 5 days, then 1/day PO prn bleeding     multivitamin-iron-minerals-folic acid chewable tablet  Chew 1 tablet by mouth daily.     sulfamethoxazole-trimethoprim 800-160 MG per tablet  Commonly known as:  BACTRIM DS  Take 1 tablet by mouth 2 (two) times daily.     vitamin C 500 MG tablet  Commonly known as:  ASCORBIC ACID  Take 500 mg by mouth daily.         Christin Fudge, CNM 11/02/2013  2:44 PM

## 2013-11-02 NOTE — MAU Provider Note (Signed)
Attestation of Attending Supervision of Advanced Practitioner (PA/CNM/NP): Evaluation and management procedures were performed by the Advanced Practitioner under my supervision and collaboration.  I have reviewed the Advanced Practitioner's note and chart, and I agree with the management and plan.  Jarrah Babich, MD, FACOG Attending Obstetrician & Gynecologist Faculty Practice, [facility]  

## 2013-11-04 LAB — POCT PREGNANCY, URINE: Preg Test, Ur: NEGATIVE

## 2013-11-05 ENCOUNTER — Encounter: Payer: Self-pay | Admitting: Family Medicine

## 2013-11-05 ENCOUNTER — Ambulatory Visit (INDEPENDENT_AMBULATORY_CARE_PROVIDER_SITE_OTHER): Payer: Medicaid Other | Admitting: Family Medicine

## 2013-11-05 VITALS — BP 111/63 | HR 81 | Ht 61.0 in | Wt 95.7 lb

## 2013-11-05 DIAGNOSIS — E0590 Thyrotoxicosis, unspecified without thyrotoxic crisis or storm: Secondary | ICD-10-CM

## 2013-11-05 DIAGNOSIS — D649 Anemia, unspecified: Secondary | ICD-10-CM

## 2013-11-05 DIAGNOSIS — N920 Excessive and frequent menstruation with regular cycle: Secondary | ICD-10-CM

## 2013-11-05 NOTE — Patient Instructions (Signed)
Thank you for coming in,   I put in for a referral to OB/Gyn. You should get a call within a week to set up a time.   I will call you with the results of your lab tests today.    Please feel free to call with any questions or concerns at any time, at [phone]. --Dr. Raeford Razor

## 2013-11-06 ENCOUNTER — Encounter: Payer: Self-pay | Admitting: Family Medicine

## 2013-11-06 LAB — T4, FREE: FREE T4: 1.08 ng/dL (ref 0.80–1.80)

## 2013-11-06 LAB — ANEMIA PANEL
%SAT: 3 % — AB (ref 20–55)
ABS Retic: 57.3 10*3/uL (ref 19.0–186.0)
FERRITIN: 8 ng/mL — AB (ref 10–291)
FOLATE: 17.1 ng/mL
Iron: 13 ug/dL — ABNORMAL LOW (ref 42–145)
RBC.: 3.37 MIL/uL — ABNORMAL LOW (ref 3.87–5.11)
Retic Ct Pct: 1.7 % (ref 0.4–2.3)
TIBC: 445 ug/dL (ref 250–470)
UIBC: 432 ug/dL — ABNORMAL HIGH (ref 125–400)
Vitamin B-12: 427 pg/mL (ref 211–911)

## 2013-11-06 LAB — CBC
HCT: 28.1 % — ABNORMAL LOW (ref 36.0–46.0)
Hemoglobin: 9.7 g/dL — ABNORMAL LOW (ref 12.0–15.0)
MCH: 28.6 pg (ref 26.0–34.0)
MCHC: 34.5 g/dL (ref 30.0–36.0)
MCV: 82.9 fL (ref 78.0–100.0)
Platelets: 267 10*3/uL (ref 150–400)
RBC: 3.39 MIL/uL — ABNORMAL LOW (ref 3.87–5.11)
RDW: 14.8 % (ref 11.5–15.5)
WBC: 5.5 10*3/uL (ref 4.0–10.5)

## 2013-11-06 LAB — T3, FREE: T3, Free: 3.2 pg/mL (ref 2.3–4.2)

## 2013-11-06 NOTE — Assessment & Plan Note (Signed)
Currently controlled with megace. Imaging has not shown an indication for her bleeding  - Will refer to [facility]  - Discussed with Dr. McDiarmid.

## 2013-11-06 NOTE — Progress Notes (Signed)
   Subjective:    Patient ID: Annette Chang, female    DOB: [birth year], 43 y.o.   MRN: [id number]  HPI Annette Chang is here for follow up for her excessive bleeding and recently been seen in the MAU at [facility] hospital.  Patient has been followed for her excessive bleeding. She had a transvaginal u/s that showed a small fibroid but no other indication for her bleeding. She was having clots forming and wearing a pad. She went to the MAU and was prescribed Megace. Since starting this medication she hasn't bled. She would still like to have the option of an endometrial ablation or a partial hysterectomy.    Current Outpatient Prescriptions on File Prior to Visit  Medication Sig Dispense Refill  . acetaminophen (TYLENOL) 500 MG tablet Take 500 mg by mouth every 6 (six) hours as needed. For headache      . Calcium Carbonate-Vitamin D (CALCIUM-VITAMIN D) 500-200 MG-UNIT per tablet Take 1 tablet by mouth daily.      . megestrol (MEGACE) 40 MG tablet Take 3/day (at the same time) for 5 days; 2/day for 5 days, then 1/day PO prn bleeding  60 tablet  3  . multivitamin-iron-minerals-folic acid (CENTRUM) chewable tablet Chew 1 tablet by mouth daily.      . Omega-3 Fatty Acids (FISH OIL PO) Take 1 capsule by mouth daily.      Marland Kitchen sulfamethoxazole-trimethoprim (BACTRIM DS) 800-160 MG per tablet Take 1 tablet by mouth 2 (two) times daily.  10 tablet  0  . vitamin C (ASCORBIC ACID) 500 MG tablet Take 500 mg by mouth daily.       No current facility-administered medications on file prior to visit.    Review of Systems See HPI     Objective:   Physical Exam BP 111/63  Pulse 81  Ht 5\' 1"  (1.549 m)  Wt 95 lb 11.2 oz (43.409 kg)  BMI 18.09 kg/m2  LMP 10/27/2013 Gen: NAD, alert, cooperative with exam, well-appearing, African American female  Skin: no rashes, normal turgor  Neuro: no gross deficits.  Psych: good insight, alert and oriented      Assessment & Plan:

## 2013-12-12 ENCOUNTER — Other Ambulatory Visit (HOSPITAL_COMMUNITY)
Admission: RE | Admit: 2013-12-12 | Discharge: 2013-12-12 | Disposition: A | Discharge status: Home / Self Care | Payer: Medicaid Other | Source: Ambulatory Visit | Attending: Obstetrics and Gynecology | Admitting: Obstetrics and Gynecology

## 2013-12-12 ENCOUNTER — Encounter: Payer: Self-pay | Admitting: Obstetrics and Gynecology

## 2013-12-12 ENCOUNTER — Other Ambulatory Visit (HOSPITAL_COMMUNITY)
Admission: RE | Admit: 2013-12-12 | Discharge: 2013-12-12 | Disposition: A | Discharge status: Home / Self Care | Payer: Medicaid Other | Source: Ambulatory Visit | Attending: Family Medicine | Admitting: Family Medicine

## 2013-12-12 ENCOUNTER — Ambulatory Visit (INDEPENDENT_AMBULATORY_CARE_PROVIDER_SITE_OTHER): Payer: Medicaid Other | Admitting: Obstetrics and Gynecology

## 2013-12-12 VITALS — BP 97/64 | HR 90 | Temp 98.4°F | Ht 61.0 in | Wt 95.9 lb

## 2013-12-12 DIAGNOSIS — N920 Excessive and frequent menstruation with regular cycle: Secondary | ICD-10-CM | POA: Insufficient documentation

## 2013-12-12 DIAGNOSIS — Z124 Encounter for screening for malignant neoplasm of cervix: Secondary | ICD-10-CM

## 2013-12-12 DIAGNOSIS — Z01812 Encounter for preprocedural laboratory examination: Secondary | ICD-10-CM

## 2013-12-12 DIAGNOSIS — Z1151 Encounter for screening for human papillomavirus (HPV): Secondary | ICD-10-CM | POA: Insufficient documentation

## 2013-12-12 DIAGNOSIS — Z01419 Encounter for gynecological examination (general) (routine) without abnormal findings: Secondary | ICD-10-CM | POA: Insufficient documentation

## 2013-12-12 LAB — POCT PREGNANCY, URINE: PREG TEST UR: NEGATIVE

## 2013-12-12 NOTE — Progress Notes (Signed)
CC: Menorrhagia    HPI Annette Chang is a 43 y.o. [id number]  who presents with longstanding menorrhagia. She reports regular monthly menses and  heavy flow with clots lasting 2 wks. She has been bleeding more heavily and nearly continuously for the past 4 months. Bleeding stopped after taking Megace prescribed in MAU 11/02/2013. Hgb was 9.9 then. She had pelvic US 08/09/2013 revealing small <1cm fibroid and ES 3 mm. TSH .29, T3 and T4 normal. She would like definitive tx: ablation or preferably hysterectomy.  Last Pap normal 1 year ago. Yearly mammograms normal. PCP at [facility].    Past Medical History  Diagnosis Date  . 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 H/O rotator cuff surgery 01/02/2012  . H/O tubal ligation 01/02/2012  . H/O: C-section 01/02/2012    3   . Headache(784.0) 01/02/2012  . Iron deficiency anemia 01/02/2012  . H/O: C-section 01/02/2012    3   . Depression   Gives hx transfusion for severe anemia  OB History  Gravida Para Term Preterm AB SAB TAB Ectopic Multiple Living  3 3 3       2     # Outcome Date GA Lbr Len/2nd Weight Sex Delivery Anes PTL Lv  3 TRM           2 TRM           1 TRM             C/S x3  Past Surgical History  Procedure Laterality Date  . Shoulder surgery    . Cesarean section      3 previous c sections  . Rotator cuff surgery    . Tubal ligation      History   Social History  . Marital Status: Single    Spouse Name: N/A    Number of Children: N/A  . Years of Education: N/A   Occupational History  . Not on file.   Social History Main Topics  . Smoking status: Never Smoker   . Smokeless tobacco: Never Used  . Alcohol Use: Yes     Comment: weekends  . Drug Use: No  . Sexual Activity: Not Currently    Birth Control/ Protection: Surgical   Other Topics Concern  . Not on file   Social History Narrative  . No narrative on file    Current Outpatient Prescriptions on File Prior to Visit  Medication Sig Dispense Refill   . acetaminophen (TYLENOL) 500 MG tablet Take 500 mg by mouth every 6 (six) hours as needed. For headache      . Calcium Carbonate-Vitamin D (CALCIUM-VITAMIN D) 500-200 MG-UNIT per tablet Take 1 tablet by mouth daily.      . multivitamin-iron-minerals-folic acid (CENTRUM) chewable tablet Chew 1 tablet by mouth daily.      . Omega-3 Fatty Acids (FISH OIL PO) Take 1 capsule by mouth daily.      . vitamin C (ASCORBIC ACID) 500 MG tablet Take 500 mg by mouth daily.       No current facility-administered medications on file prior to visit.    Allergies  Allergen Reactions  . Percocet [Oxycodone-Acetaminophen] Itching    ROS Pertinent items in HPI  PHYSICAL EXAM Filed Vitals:   12/12/13 1256  BP: 97/64  Pulse: 90  Temp: 98.4 F (36.9 C)   General: Well nourished, well developed female in no acute distress Cardiovascular: Normal rate Respiratory: Normal effort Abdomen: Soft, nontender Back: No CVAT Extremities: No edema Neurologic:  Alert and oriented Speculum exam: NEFG; vagina with physiologic discharge, no blood; cervix clean Bimanual exam: cervix closed, no CMT; uterus ULNS; no adnexal tenderness or masses   LAB RESULTS Results for orders placed in visit on 12/12/13 (from the past 24 hour(s))  POCT PREGNANCY, URINE     Status: None   Collection Time    12/12/13  1:13 PM      Result Value Ref Range   Preg Test, Ur NEGATIVE  NEGATIVE     ASSESSMENT  Menorrhaghia with anemia  PLAN Pap and endometrial biopsy done  Procedure note Patient given informed consent, signed copy in the chart, time out was performed. Appropriate time out taken.  The patient was placed in the lithotomy position and the cervix brought into view with sterile speculum.  Portio of cervix cleansed x 2 with betadine swabs. The uterus was sounded for depth of 9cm. A pipelle was introduced to into the uterus, suction created, and an endometrial sample was obtained. All equipment was removed and  accounted for. The patient tolerated the procedure well.    Patient given post procedure instructions. The patient will return in 2 weeks for results.   Lorene Dy, CNM 12/12/2013 1:30 PM

## 2013-12-12 NOTE — Progress Notes (Signed)
Patient reports heavy bleeding and clots for the last 4 months; continuous. Reports soaking 6-7 pads per day and needing to sleep with a pad on the bed at night. Was given megace last month and states it did not stop bleeding. Bleeding stopped last week.  Has not had a pap since 2013. UPT obtained for possible endometrial biopsy.

## 2013-12-12 NOTE — Patient Instructions (Signed)
Dysfunctional Uterine Bleeding Normally, menstrual periods begin between ages 11 to 17 in young women. A normal menstrual cycle/period may begin every 23 days up to 35 days and lasts from 1 to 7 days. Around 12 to 14 days before your menstrual period starts, ovulation (ovary produces an egg) occurs. When counting the time between menstrual periods, count from the first day of bleeding of the previous period to the first day of bleeding of the next period. Dysfunctional (abnormal) uterine bleeding is bleeding that is different from a normal menstrual period. Your periods may come earlier or later than usual. They may be lighter, have blood clots or be heavier. You may have bleeding between periods, or you may skip one period or more. You may have bleeding after sexual intercourse, bleeding after menopause, or no menstrual period. CAUSES   Pregnancy (normal, miscarriage, tubal).  IUDs (intrauterine device, birth control).  Birth control pills.  Hormone treatment.  Menopause.  Infection of the cervix.  Blood clotting problems.  Infection of the inside lining of the uterus.  Endometriosis, inside lining of the uterus growing in the pelvis and other female organs.  Adhesions (scar tissue) inside the uterus.  Obesity or severe weight loss.  Uterine polyps inside the uterus.  Cancer of the vagina, cervix, or uterus.  Ovarian cysts or polycystic ovary syndrome.  Medical problems (diabetes, thyroid disease).  Uterine fibroids (noncancerous tumor).  Problems with your female hormones.  Endometrial hyperplasia, very thick lining and enlarged cells inside of the uterus.  Medicines that interfere with ovulation.  Radiation to the pelvis or abdomen.  Chemotherapy. DIAGNOSIS   Your doctor will discuss the history of your menstrual periods, medicines you are taking, changes in your weight, stress in your life, and any medical problems you may have.  Your doctor will do a physical  and pelvic examination.  Your doctor may want to perform certain tests to make a diagnosis, such as:  Pap test.  Blood tests.  Cultures for infection.  CT scan.  Ultrasound.  Hysteroscopy.  Laparoscopy.  MRI.  Hysterosalpingography.  D and C.  Endometrial biopsy. TREATMENT  Treatment will depend on the cause of the dysfunctional uterine bleeding (DUB). Treatment may include:  Observing your menstrual periods for a couple of months.  Prescribing medicines for medical problems, including:  Antibiotics.  Hormones.  Birth control pills.  Removing an IUD (intrauterine device, birth control).  Surgery:  D and C (scrape and remove tissue from inside the uterus).  Laparoscopy (examine inside the abdomen with a lighted tube).  Uterine ablation (destroy lining of the uterus with electrical current, laser, heat, or freezing).  Hysteroscopy (examine cervix and uterus with a lighted tube).  Hysterectomy (remove the uterus). HOME CARE INSTRUCTIONS   If medicines were prescribed, take exactly as directed. Do not change or switch medicines without consulting your caregiver.  Long term heavy bleeding may result in iron deficiency. Your caregiver may have prescribed iron pills. They help replace the iron that your body lost from heavy bleeding. Take exactly as directed.  Do not take aspirin or medicines that contain aspirin one week before or during your menstrual period. Aspirin may make the bleeding worse.  If you need to change your sanitary pad or tampon more than once every 2 hours, stay in bed with your feet elevated and a cold pack on your lower abdomen. Rest as much as possible, until the bleeding stops or slows down.  Eat well-balanced meals. Eat foods high in iron. Examples   are: °· Leafy green vegetables. °· Whole-grain breads and cereals. °· Eggs. °· Meat. °· Liver. °· Do not try to lose weight until the abnormal bleeding has stopped and your blood iron level is  back to normal. Do not lift more than ten pounds or do strenuous activities when you are bleeding. °· For a couple of months, make note on your calendar, marking the start and ending of your period, and the type of bleeding (light, medium, heavy, spotting, clots or missed periods). This is for your caregiver to better evaluate your problem. °SEEK MEDICAL CARE IF:  °· You develop nausea (feeling sick to your stomach) and vomiting, dizziness, or diarrhea while you are taking your medicine. °· You are getting lightheaded or weak. °· You have any problems that may be related to the medicine you are taking. °· You develop pain with your DUB. °· You want to remove your IUD. °· You want to stop or change your birth control pills or hormones. °· You have any type of abnormal bleeding mentioned above. °· You are over 16 years old and have not had a menstrual period yet. °· You are 43 years old and you are still having menstrual periods. °· You have any of the symptoms mentioned above. °· You develop a rash. °SEEK IMMEDIATE MEDICAL CARE IF:  °· An oral temperature above 102° F (38.9° C) develops. °· You develop chills. °· You are changing your sanitary pad or tampon more than once an hour. °· You develop abdominal pain. °· You pass out or faint. °Document Released: 07/08/2000 Document Revised: 10/03/2011 Document Reviewed: 06/09/2009 °ExitCare® Patient Information ©2014 [employer]. °Endometrial Biopsy °Endometrial biopsy is a procedure in which a tissue sample is taken from inside the uterus. The tissue sample is then looked at under a microscope to see if the tissue is normal or abnormal. The endometrium is the lining of the uterus. This procedure helps determine where you are in your menstrual cycle and how hormone levels are affecting the lining of the uterus. This procedure may also be used to evaluate uterine bleeding or to diagnose endometrial cancer, tuberculosis, polyps, or inflammatory conditions.  °LET YOUR  HEALTH CARE PROVIDER KNOW ABOUT: °· Any allergies you have. °· All medicines you are taking, including vitamins, herbs, eye drops, creams, and over-the-counter medicines. °· Previous problems you or members of your family have had with the use of anesthetics. °· Any blood disorders you have. °· Previous surgeries you have had. °· Medical conditions you have. °· Possibility of pregnancy. °RISKS AND COMPLICATIONS °Generally, this is a safe procedure. However, as with any procedure, complications can occur. Possible complications include: °· Bleeding. °· Pelvic infection. °· Puncture of the uterine wall with the biopsy device (rare). °BEFORE THE PROCEDURE  °· Keep a record of your menstrual cycles as directed by your health care provider. You may need to schedule your procedure for a specific time in your cycle. °· You may want to bring a sanitary pad to wear home after the procedure. °· Arrange for someone to drive you home after the procedure if you will be given a medicine to help you relax (sedative).  °PROCEDURE  °· You may be given a sedative to relax you. °· You will lie on an exam table with your feet and legs supported as in a pelvic exam. °· Your health care provider will insert an instrument (speculum) into your vagina to see your cervix. °· Your cervix will be cleansed with an antiseptic solution.   A medicine (local anesthetic) will be used to numb the cervix. °· A forceps instrument (tenaculum) will be used to hold your cervix steady for the biopsy. °· A thin, rodlike instrument (uterine sound) will be inserted through your cervix to determine the length of your uterus and the location where the biopsy sample will be removed. °· A thin, flexible tube (catheter) will be inserted through your cervix and into the uterus. The catheter is used to collect the biopsy sample from your endometrial tissue. °· The catheter and speculum will then be removed, and the tissue sample will be sent to a lab for  examination. °AFTER THE PROCEDURE °· You will rest in a recovery area until you are ready to go home. °· You may have mild cramping and a small amount of vaginal bleeding for a few days after the procedure. This is normal. °· Make sure you find out how to get your test results. °Document Released: 11/11/2004 Document Revised: 03/13/2013 Document Reviewed: 12/26/2012 °ExitCare® Patient Information ©2014 [employer]. ° °

## 2013-12-19 ENCOUNTER — Encounter: Payer: Self-pay | Admitting: *Deleted

## 2013-12-30 ENCOUNTER — Ambulatory Visit: Payer: Medicaid Other | Admitting: Obstetrics and Gynecology

## 2013-12-31 ENCOUNTER — Telehealth: Payer: Self-pay | Admitting: *Deleted

## 2013-12-31 NOTE — Telephone Encounter (Signed)
Patient walked in to clinic today stating that she didn't come to her appointment yesterday because she felt sick. I gave her the biopsy results and told her to reschedule the appointment so that she can discuss further management. Patient agrees.

## 2014-01-16 ENCOUNTER — Ambulatory Visit (INDEPENDENT_AMBULATORY_CARE_PROVIDER_SITE_OTHER): Payer: Medicaid Other | Admitting: Obstetrics & Gynecology

## 2014-01-16 ENCOUNTER — Encounter: Payer: Self-pay | Admitting: Obstetrics & Gynecology

## 2014-01-16 VITALS — BP 120/81 | HR 75 | Temp 98.9°F | Ht 61.0 in | Wt 95.9 lb

## 2014-01-16 DIAGNOSIS — N920 Excessive and frequent menstruation with regular cycle: Secondary | ICD-10-CM

## 2014-01-16 DIAGNOSIS — N921 Excessive and frequent menstruation with irregular cycle: Secondary | ICD-10-CM

## 2014-01-16 NOTE — Progress Notes (Signed)
   Subjective:    Patient ID: Annette Chang, female    DOB: March 26, 1971, 43 y.o.   MRN: [id number]  HPI  43 yo S AA P2 (64 and 21 yo kids) here to discuss treatment for her menorrhagia which has led to anemia with hbg of 9.7. U/s, EMBX, pap, TSH were all negative. I have offered Novasure and Mirena. We had a thorough discussion with pictures of the two options. She opts for Novasure. I will send [place] an email to schedule this asap.   Review of Systems She has had a BTL.    Objective:   Physical Exam        Assessment & Plan:  As above

## 2014-03-11 ENCOUNTER — Encounter (HOSPITAL_COMMUNITY)
Admission: RE | Disposition: A | Discharge status: Home / Self Care | Payer: Self-pay | Source: Ambulatory Visit | Attending: Obstetrics & Gynecology

## 2014-03-11 ENCOUNTER — Encounter (HOSPITAL_COMMUNITY): Payer: Self-pay | Admitting: Anesthesiology

## 2014-03-11 ENCOUNTER — Encounter (HOSPITAL_COMMUNITY): Payer: Medicaid Other | Admitting: Certified Registered Nurse Anesthetist

## 2014-03-11 ENCOUNTER — Ambulatory Visit (HOSPITAL_COMMUNITY)
Admission: RE | Admit: 2014-03-11 | Discharge: 2014-03-11 | Disposition: A | Discharge status: Home / Self Care | Payer: Medicaid Other | Source: Ambulatory Visit | Attending: Obstetrics & Gynecology | Admitting: Obstetrics & Gynecology

## 2014-03-11 ENCOUNTER — Ambulatory Visit (HOSPITAL_COMMUNITY): Payer: Medicaid Other | Admitting: Certified Registered Nurse Anesthetist

## 2014-03-11 DIAGNOSIS — D509 Iron deficiency anemia, unspecified: Secondary | ICD-10-CM | POA: Insufficient documentation

## 2014-03-11 DIAGNOSIS — F3289 Other specified depressive episodes: Secondary | ICD-10-CM | POA: Diagnosis not present

## 2014-03-11 DIAGNOSIS — N938 Other specified abnormal uterine and vaginal bleeding: Secondary | ICD-10-CM | POA: Insufficient documentation

## 2014-03-11 DIAGNOSIS — F329 Major depressive disorder, single episode, unspecified: Secondary | ICD-10-CM | POA: Insufficient documentation

## 2014-03-11 DIAGNOSIS — D251 Intramural leiomyoma of uterus: Secondary | ICD-10-CM | POA: Diagnosis not present

## 2014-03-11 DIAGNOSIS — N949 Unspecified condition associated with female genital organs and menstrual cycle: Secondary | ICD-10-CM | POA: Diagnosis not present

## 2014-03-11 DIAGNOSIS — Z885 Allergy status to narcotic agent status: Secondary | ICD-10-CM | POA: Insufficient documentation

## 2014-03-11 DIAGNOSIS — N925 Other specified irregular menstruation: Secondary | ICD-10-CM | POA: Diagnosis present

## 2014-03-11 HISTORY — PX: NOVASURE ABLATION: SHX5394

## 2014-03-11 LAB — CBC
HCT: 31.9 % — ABNORMAL LOW (ref 36.0–46.0)
Hemoglobin: 10.1 g/dL — ABNORMAL LOW (ref 12.0–15.0)
MCH: 24.5 pg — ABNORMAL LOW (ref 26.0–34.0)
MCHC: 31.7 g/dL (ref 30.0–36.0)
MCV: 77.4 fL — ABNORMAL LOW (ref 78.0–100.0)
Platelets: 165 K/uL (ref 150–400)
RBC: 4.12 MIL/uL (ref 3.87–5.11)
RDW: 16.6 % — ABNORMAL HIGH (ref 11.5–15.5)
WBC: 3.1 K/uL — ABNORMAL LOW (ref 4.0–10.5)

## 2014-03-11 LAB — PREGNANCY, URINE: PREG TEST UR: NEGATIVE

## 2014-03-11 SURGERY — NOVASURE ABLATION
Anesthesia: General | Site: Vagina

## 2014-03-11 MED ORDER — FENTANYL CITRATE 0.05 MG/ML IJ SOLN
INTRAMUSCULAR | Status: AC
  Filled 2014-03-11: qty 5

## 2014-03-11 MED ORDER — DEXAMETHASONE SODIUM PHOSPHATE 4 MG/ML IJ SOLN
INTRAMUSCULAR | Status: AC
  Filled 2014-03-11: qty 1

## 2014-03-11 MED ORDER — PROPOFOL 10 MG/ML IV EMUL
INTRAVENOUS | Status: AC
Start: 2014-03-11 — End: 2014-03-11
  Filled 2014-03-11: qty 20

## 2014-03-11 MED ORDER — BUPIVACAINE HCL (PF) 0.5 % IJ SOLN
INTRAMUSCULAR | Status: AC
  Filled 2014-03-11: qty 30

## 2014-03-11 MED ORDER — LIDOCAINE HCL (CARDIAC) 20 MG/ML IV SOLN
INTRAVENOUS | Status: AC
  Filled 2014-03-11: qty 5

## 2014-03-11 MED ORDER — DEXAMETHASONE SODIUM PHOSPHATE 10 MG/ML IJ SOLN
INTRAMUSCULAR | Status: DC | PRN
  Administered 2014-03-11: 4 mg via INTRAVENOUS

## 2014-03-11 MED ORDER — MIDAZOLAM HCL 2 MG/2ML IJ SOLN
INTRAMUSCULAR | Status: DC | PRN
  Administered 2014-03-11: 1 mg via INTRAVENOUS

## 2014-03-11 MED ORDER — SCOPOLAMINE 1 MG/3DAYS TD PT72
MEDICATED_PATCH | TRANSDERMAL | Status: AC
  Administered 2014-03-11: 1.5 mg via TRANSDERMAL
  Filled 2014-03-11: qty 1

## 2014-03-11 MED ORDER — MIDAZOLAM HCL 2 MG/2ML IJ SOLN: 1.0000 mg | Freq: Once | INTRAMUSCULAR | Status: DC

## 2014-03-11 MED ORDER — MIDAZOLAM HCL 2 MG/2ML IJ SOLN
INTRAMUSCULAR | Status: AC
  Filled 2014-03-11: qty 2

## 2014-03-11 MED ORDER — PROPOFOL 10 MG/ML IV BOLUS
INTRAVENOUS | Status: DC | PRN
  Administered 2014-03-11: 120 mg via INTRAVENOUS

## 2014-03-11 MED ORDER — KETOROLAC TROMETHAMINE 30 MG/ML IJ SOLN
INTRAMUSCULAR | Status: DC | PRN
  Administered 2014-03-11: 30 mg via INTRAVENOUS

## 2014-03-11 MED ORDER — MIDAZOLAM HCL 5 MG/5ML IJ SOLN
1.0000 mg | Freq: Once | INTRAMUSCULAR | Status: AC
  Administered 2014-03-11: 1 mg via INTRAVENOUS

## 2014-03-11 MED ORDER — IBUPROFEN 600 MG PO TABS: 600.0000 mg | ORAL_TABLET | Freq: Four times a day (QID) | ORAL | Status: DC | PRN

## 2014-03-11 MED ORDER — SCOPOLAMINE 1 MG/3DAYS TD PT72
1.0000 | MEDICATED_PATCH | Freq: Once | TRANSDERMAL | Status: AC
  Administered 2014-03-11: 1 via TRANSDERMAL
  Administered 2014-03-11: 1.5 mg via TRANSDERMAL

## 2014-03-11 MED ORDER — ONDANSETRON HCL 4 MG/2ML IJ SOLN
INTRAMUSCULAR | Status: DC | PRN
  Administered 2014-03-11: 4 mg via INTRAVENOUS

## 2014-03-11 MED ORDER — KETOROLAC TROMETHAMINE 30 MG/ML IJ SOLN
INTRAMUSCULAR | Status: AC
  Filled 2014-03-11: qty 1

## 2014-03-11 MED ORDER — ONDANSETRON HCL 4 MG/2ML IJ SOLN
INTRAMUSCULAR | Status: AC
  Filled 2014-03-11: qty 2

## 2014-03-11 MED ORDER — LIDOCAINE HCL (CARDIAC) 20 MG/ML IV SOLN
INTRAVENOUS | Status: DC | PRN
  Administered 2014-03-11: 50 mg via INTRAVENOUS

## 2014-03-11 MED ORDER — LACTATED RINGERS IV SOLN
INTRAVENOUS | Status: DC
  Administered 2014-03-11 (×2): via INTRAVENOUS

## 2014-03-11 MED ORDER — BUPIVACAINE HCL (PF) 0.5 % IJ SOLN
INTRAMUSCULAR | Status: DC | PRN
  Administered 2014-03-11: 30 mL

## 2014-03-11 MED ORDER — FENTANYL CITRATE 0.05 MG/ML IJ SOLN
INTRAMUSCULAR | Status: DC | PRN
Start: 2014-03-11 — End: 2014-03-11
  Administered 2014-03-11: 100 ug via INTRAVENOUS

## 2014-03-11 SURGICAL SUPPLY — 13 items
ABLATOR ENDOMETRIAL BIPOLAR (ABLATOR) ×3 IMPLANT
CATH ROBINSON RED A/P 16FR (CATHETERS) ×3 IMPLANT
CLOTH BEACON ORANGE TIMEOUT ST (SAFETY) ×3 IMPLANT
GLOVE BIO SURGEON STRL SZ 6.5 (GLOVE) ×2 IMPLANT
GLOVE BIO SURGEONS STRL SZ 6.5 (GLOVE) ×1
GOWN STRL REUS W/TWL LRG LVL3 (GOWN DISPOSABLE) ×6 IMPLANT
NDL SPNL 22GX3.5 QUINCKE BK (NEEDLE) ×1 IMPLANT
NEEDLE SPNL 22GX3.5 QUINCKE BK (NEEDLE) ×3 IMPLANT
PACK VAGINAL MINOR WOMEN LF (CUSTOM PROCEDURE TRAY) ×3 IMPLANT
SYRINGE CONTROL L 12CC (SYRINGE) ×3 IMPLANT
SYRINGE CONTROL LL 12CC (SYRINGE) ×1 IMPLANT
TOWEL OR 17X24 6PK STRL BLUE (TOWEL DISPOSABLE) ×6 IMPLANT
WATER STERILE IRR 1000ML POUR (IV SOLUTION) ×3 IMPLANT

## 2014-03-11 NOTE — Op Note (Signed)
03/11/2014  10:40 AM  PATIENT:  Annette Chang  43 y.o. female  PRE-OPERATIVE DIAGNOSIS:  DUB, anemia  POST-OPERATIVE DIAGNOSIS:  DUB, anemia  PROCEDURE:  Procedure(s): NOVASURE ABLATION (N/A)  SURGEON:  Surgeon(s) and Role:    * Emily Filbert, MD - Primary  PHYSICIAN ASSISTANT:   ASSISTANTS: none   ANESTHESIA:   local and general  EBL:  Total I/O In: 1000 [I.V.:1000] Out: 60 [Urine:60]  BLOOD ADMINISTERED:none  DRAINS: none   LOCAL MEDICATIONS USED:  MARCAINE     SPECIMEN:  No Specimen  DISPOSITION OF SPECIMEN:  N/A  COUNTS:  YES  TOURNIQUET:  * No tourniquets in log *  DICTATION: .Dragon Dictation  PLAN OF CARE: Discharge to home after PACU  PATIENT DISPOSITION:  PACU - hemodynamically stable.   Delay start of Pharmacological VTE agent (>24hrs) due to surgical blood loss or risk of bleeding: not applicable    The risks, benefits, and alternatives of surgery were explained, understood, and accepted. I quoted her a 90% satisfaction rate for the NovaSure endometrial ablation. All questions were answered. She was taken to the operating room and placed in the dorsal lithotomy position. General anesthesia was applied without complication. Her vagina was prepped and draped in the usual sterile fashion.  A bimanual exam revealed a normal size and shaped mobile uterus is anteverted. Her adnexa were non-enlarged. A speculum was placed and the anterior lip of her cervix was grasped with a single-tooth tenaculum. A paracervical block was performed using 30 mL of 0.5% Marcaine. Her uterus sounded to 9 cm. Her cervical length measured 4 cm. This gives her uterine cavity length of 5 cm. The cervix was gently dilated with Hegar dilators to accommodate the NovaSure device. The arms of the device was deployed and the uterine cavity width measured 3.3 cm. The device passed its test. An it ran for 87 seconds. I removed the tenaculum and no bleeding was noted. The NovaSure device was  removed and no bleeding was noted from the endocervix. She was extubated and taken to the recovery room in stable condition. She tolerated the procedure well.

## 2014-03-11 NOTE — Transfer of Care (Signed)
Immediate Anesthesia Transfer of Care Note  Patient: Annette Chang  Procedure(s) Performed: Procedure(s): NOVASURE ABLATION (N/A)  Patient Location: PACU  Anesthesia Type:General  Level of Consciousness: awake, alert  and oriented  Airway & Oxygen Therapy: Patient Spontanous Breathing and Patient connected to nasal cannula oxygen  Post-op Assessment: Report given to PACU RN, Post -op Vital signs reviewed and stable and Patient moving all extremities X 4  Post vital signs: Reviewed and stable  Complications: No apparent anesthesia complications

## 2014-03-11 NOTE — Anesthesia Preprocedure Evaluation (Signed)
Anesthesia Evaluation  Patient identified by MRN, date of birth, ID band Patient awake    Reviewed: Allergy & Precautions, H&P , NPO status , Patient's Chart, lab work & pertinent test results  Airway Mallampati: I TM Distance: >3 FB Neck ROM: Full    Dental no notable dental hx. (+) Teeth Intact   Pulmonary neg pulmonary ROS,  breath sounds clear to auscultation  Pulmonary exam normal       Cardiovascular negative cardio ROS  Rhythm:Regular Rate:Normal     Neuro/Psych  Headaches, PSYCHIATRIC DISORDERS Depression    GI/Hepatic negative GI ROS, Neg liver ROS,   Endo/Other  negative endocrine ROS  Renal/GU negative Renal ROS  negative genitourinary   Musculoskeletal   Abdominal   Peds  Hematology  (+) anemia ,   Anesthesia Other Findings   Reproductive/Obstetrics Menorrhagia                           Anesthesia Physical Anesthesia Plan  ASA: II  Anesthesia Plan: General   Post-op Pain Management:    Induction: Intravenous  Airway Management Planned: LMA  Additional Equipment:   Intra-op Plan:   Post-operative Plan: Extubation in OR  Informed Consent: I have reviewed the patients History and Physical, chart, labs and discussed the procedure including the risks, benefits and alternatives for the proposed anesthesia with the patient or authorized representative who has indicated his/her understanding and acceptance.   Dental advisory given  Plan Discussed with: CRNA, Anesthesiologist and Surgeon  Anesthesia Plan Comments:         Anesthesia Quick Evaluation

## 2014-03-11 NOTE — Anesthesia Postprocedure Evaluation (Signed)
  Anesthesia Post-op Note  Patient: Annette Chang  Procedure(s) Performed: Procedure(s): NOVASURE ABLATION (N/A)  Patient Location: PACU  Anesthesia Type:General  Level of Consciousness: awake, alert  and oriented  Airway and Oxygen Therapy: Patient Spontanous Breathing  Post-op Pain: none  Post-op Assessment: Post-op Vital signs reviewed, Patient's Cardiovascular Status Stable, Respiratory Function Stable, Patent Airway, No signs of Nausea or vomiting and Pain level controlled  Post-op Vital Signs: Reviewed and stable  Last Vitals:  Filed Vitals:   03/11/14 1130  BP: 147/101  Pulse: 50  Temp:   Resp: 12    Complications: No apparent anesthesia complications

## 2014-03-11 NOTE — H&P (Signed)
Annette Chang is an 43 y.o. S AA G3P3 (11,24, and 41 yo deceased child)  female. She is here today for a endometrial ablation due to her symptomatic DUB. Sometimes she will bleed for 2-3 months straight. An EMBX was normal. Her u/s showed a small intramural fibroid.  Pertinent Gynecological History: Menses: flow is excessive with use of 10+ pads or tampons on heaviest days Bleeding: dysfunctional uterine bleeding Contraception: none. She has had a BTL. DES exposure: denies Blood transfusions: "several" Sexually transmitted diseases: no past history Previous GYN Procedures: none  Last mammogram: normal Date: 2015 Last pap: normal Date: 2015    Menstrual History: Menarche age: 5 Patient's last menstrual period was 02/21/2014.    Past Medical History  Diagnosis Date  . 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 H/O rotator cuff surgery 01/02/2012  . H/O tubal ligation 01/02/2012  . H/O: C-section 01/02/2012    3   . Headache(784.0) 01/02/2012  . Iron deficiency anemia 01/02/2012  . H/O: C-section 01/02/2012    3   . Depression     Past Surgical History  Procedure Laterality Date  . Shoulder surgery    . Cesarean section      3 previous c sections  . Rotator cuff surgery    . Tubal ligation      Family History  Problem Relation Age of Onset  . Diabetes Maternal Grandmother   . Diabetes Paternal Grandmother   . Diabetes Paternal Grandfather     Social History:  reports that she has never smoked. She has never used smokeless tobacco. She reports that she drinks alcohol. She reports that she does not use illicit drugs.  Allergies:  Allergies  Allergen Reactions  . Percocet [Oxycodone-Acetaminophen] Itching    Prescriptions prior to admission  Medication Sig Dispense Refill  . Calcium Carbonate-Vitamin D (CALCIUM-VITAMIN D) 500-200 MG-UNIT per tablet Take 1 tablet by mouth daily.      . multivitamin-iron-minerals-folic acid (CENTRUM) chewable tablet Chew 1  tablet by mouth daily.      . naproxen sodium (ANAPROX) 220 MG tablet Take 220 mg by mouth 2 (two) times daily as needed.      . Omega-3 Fatty Acids (FISH OIL PO) Take 1 capsule by mouth daily.      . vitamin C (ASCORBIC ACID) 500 MG tablet Take 500 mg by mouth daily.        ROS Some post coital discomfort She works at [employer]  Blood pressure 118/84, pulse 67, temperature 98.1 F (36.7 C), temperature source Oral, resp. rate 16, height 5\' 1"  (1.549 m), weight 43.092 kg (95 lb), last menstrual period 02/21/2014, SpO2 99.00%. Physical Exam Heart- rrr Lungs- CTAB Abd- benign  Results for orders placed during the hospital encounter of 03/11/14 (from the past 24 hour(s))  PREGNANCY, URINE     Status: None   Collection Time    03/11/14  8:42 AM      Result Value Ref Range   Preg Test, Ur NEGATIVE  NEGATIVE  CBC     Status: Abnormal   Collection Time    03/11/14  8:43 AM      Result Value Ref Range   WBC 3.1 (*) 4.0 - 10.5 K/uL   RBC 4.12  3.87 - 5.11 MIL/uL   Hemoglobin 10.1 (*) 12.0 - 15.0 g/dL   HCT 31.9 (*) 36.0 - 46.0 %   MCV 77.4 (*) 78.0 - 100.0 fL   MCH 24.5 (*) 26.0 - 34.0 pg  MCHC 31.7  30.0 - 36.0 g/dL   RDW 16.6 (*) 11.5 - 15.5 %   Platelets 165  150 - 400 K/uL    No results found.  Assessment/Plan: DUB- plan for endometrial ablation. I quoted her a 90% satisfaction rate.  She understands the risks of surgery, including, but not to infection, bleeding, DVTs, damage to bowel, bladder, ureters. She wishes to proceed.     Carman Essick C. 03/11/2014, 9:23 AM

## 2014-03-11 NOTE — Discharge Instructions (Signed)
Endometrial Ablation Endometrial ablation removes the lining of the uterus (endometrium). It is usually a same-day, outpatient treatment. Ablation helps avoid major surgery, such as surgery to remove the cervix and uterus (hysterectomy). After endometrial ablation, you will have little or no menstrual bleeding and may not be able to have children. However, if you are premenopausal, you will need to use a reliable method of birth control following the procedure because of the small chance that pregnancy can occur. There are different reasons to have this procedure, which include:  Heavy periods.  Bleeding that is causing anemia.  Irregular bleeding.  Bleeding fibroids on the lining inside the uterus if they are smaller than 3 centimeters. This procedure should not be done if:  You want children in the future.  You have severe cramps with your menstrual period.  You have precancerous or cancerous cells in your uterus.  You were recently pregnant.  You have gone through menopause.  You have had major surgery on the uterus, such as a cesarean delivery. LET [facility] CARE PROVIDER KNOW ABOUT:  Any allergies you have.  All medicines you are taking, including vitamins, herbs, eye drops, creams, and over-the-counter medicines.  Previous problems you or members of your family have had with the use of anesthetics.  Any blood disorders you have.  Previous surgeries you have had.  Medical conditions you have. RISKS AND COMPLICATIONS  Generally, this is a safe procedure. However, as with any procedure, complications can occur. Possible complications include:  Perforation of the uterus.  Bleeding.  Infection of the uterus, bladder, or vagina.  Injury to surrounding organs.  An air bubble to the lung (air embolus).  Pregnancy following the procedure.  Failure of the procedure to help the problem, requiring hysterectomy.  Decreased ability to diagnose cancer in the lining of  the uterus. BEFORE THE PROCEDURE  The lining of the uterus must be tested to make sure there is no pre-cancerous or cancer cells present.  An ultrasound may be performed to look at the size of the uterus and to check for abnormalities.  Medicines may be given to thin the lining of the uterus. PROCEDURE  During the procedure, your health care provider will use a tool called a resectoscope to help see inside your uterus. There are different ways to remove the lining of your uterus.   Radiofrequency - This method uses a radiofrequency-alternating electric current to remove the lining of the uterus.  Cryotherapy - This method uses extreme cold to freeze the lining of the uterus.  Heated-Free Liquid - This method uses heated salt (saline) solution to remove the lining of the uterus.  Microwave - This method uses high-energy microwaves to heat up the lining of the uterus to remove it.  Thermal balloon - This method involves inserting a catheter with a balloon tip into the uterus. The balloon tip is filled with heated fluid to remove the lining of the uterus. AFTER THE PROCEDURE  After your procedure, do not have sexual intercourse or insert anything into your vagina until permitted by your health care provider. After the procedure, you may experience:  Cramps.  Vaginal discharge.  Frequent urination. Document Released: 05/20/2004 Document Revised: 03/13/2013 Document Reviewed: 12/12/2012 [facility] Patient Information 2015 [place], [place]. This information is not intended to replace advice given to you by your health care provider. Make sure you discuss any questions you have with your health care provider.  Post Anesthesia Home Care Instructions  Activity: Get plenty of rest for  the remainder of the day. A responsible adult should stay with you for 24 hours following the procedure.  For the next 24 hours, DO NOT: -Drive a car -Paediatric nurse -Drink alcoholic beverages -Take any  medication unless instructed by your physician -Make any legal decisions or sign important papers.  Meals: Start with liquid foods such as gelatin or soup. Progress to regular foods as tolerated. Avoid greasy, spicy, heavy foods. If nausea and/or vomiting occur, drink only clear liquids until the nausea and/or vomiting subsides. Call your physician if vomiting continues.  Special Instructions/Symptoms: Your throat may feel dry or sore from the anesthesia or the breathing tube placed in your throat during surgery. If this causes discomfort, gargle with warm salt water. The discomfort should disappear within 24 hours.

## 2014-03-12 ENCOUNTER — Encounter (HOSPITAL_COMMUNITY): Payer: Self-pay | Admitting: Obstetrics & Gynecology

## 2014-04-29 ENCOUNTER — Ambulatory Visit: Payer: Medicaid Other | Admitting: Family Medicine

## 2014-05-05 ENCOUNTER — Encounter: Payer: Self-pay | Admitting: Obstetrics & Gynecology

## 2014-05-05 ENCOUNTER — Ambulatory Visit (INDEPENDENT_AMBULATORY_CARE_PROVIDER_SITE_OTHER): Payer: Medicaid Other | Admitting: Obstetrics & Gynecology

## 2014-05-05 VITALS — BP 125/88 | HR 73 | Ht 61.0 in | Wt 92.6 lb

## 2014-05-05 DIAGNOSIS — Z9889 Other specified postprocedural states: Secondary | ICD-10-CM

## 2014-05-05 MED ORDER — SULFAMETHOXAZOLE-TMP DS 800-160 MG PO TABS: 1.0000 | ORAL_TABLET | Freq: Two times a day (BID) | ORAL | Status: DC

## 2014-05-05 NOTE — Progress Notes (Signed)
   Subjective:    Patient ID: Annette Chang, female    DOB: [birth year], 43 y.o.   MRN: [id number]  HPI  43 yo lady now about 6 weeks po s/p Novasure endometrial ablation. She has had 1 period since her procedure and it only lasted 3 days and did not require such heavy pads as prior to surgery. She has some suprapubic discomfort which is relieved by Aleve.   Review of Systems     Objective:   Physical Exam        Assessment & Plan:  Suprapubic pain- check u c&s. Bactrim DS for a week RTC 1 year

## 2014-05-26 ENCOUNTER — Encounter: Payer: Self-pay | Admitting: Obstetrics & Gynecology

## 2014-06-17 ENCOUNTER — Ambulatory Visit (HOSPITAL_COMMUNITY): Payer: Medicaid Other

## 2014-06-24 ENCOUNTER — Ambulatory Visit (HOSPITAL_COMMUNITY): Payer: Medicaid Other | Attending: Obstetrics & Gynecology

## 2014-08-02 ENCOUNTER — Emergency Department (HOSPITAL_COMMUNITY)
Admission: EM | Admit: 2014-08-02 | Discharge: 2014-08-02 | Disposition: A | Discharge status: Home / Self Care | Payer: Medicaid Other | Attending: Emergency Medicine | Admitting: Emergency Medicine

## 2014-08-02 ENCOUNTER — Encounter (HOSPITAL_COMMUNITY): Payer: Self-pay | Admitting: Emergency Medicine

## 2014-08-02 ENCOUNTER — Emergency Department (HOSPITAL_COMMUNITY): Payer: Medicaid Other

## 2014-08-02 DIAGNOSIS — Z8659 Personal history of other mental and behavioral disorders: Secondary | ICD-10-CM | POA: Insufficient documentation

## 2014-08-02 DIAGNOSIS — M25519 Pain in unspecified shoulder: Secondary | ICD-10-CM

## 2014-08-02 DIAGNOSIS — Z792 Long term (current) use of antibiotics: Secondary | ICD-10-CM | POA: Diagnosis not present

## 2014-08-02 DIAGNOSIS — Z79899 Other long term (current) drug therapy: Secondary | ICD-10-CM | POA: Diagnosis not present

## 2014-08-02 DIAGNOSIS — M25511 Pain in right shoulder: Secondary | ICD-10-CM | POA: Insufficient documentation

## 2014-08-02 DIAGNOSIS — Z862 Personal history of diseases of the blood and blood-forming organs and certain disorders involving the immune mechanism: Secondary | ICD-10-CM | POA: Insufficient documentation

## 2014-08-02 DIAGNOSIS — Z9851 Tubal ligation status: Secondary | ICD-10-CM | POA: Diagnosis not present

## 2014-08-02 MED ORDER — NAPROXEN 500 MG PO TABS: 500.0000 mg | ORAL_TABLET | Freq: Two times a day (BID) | ORAL | Status: DC

## 2014-08-02 MED ORDER — TRAMADOL HCL 50 MG PO TABS: 50.0000 mg | ORAL_TABLET | Freq: Four times a day (QID) | ORAL | Status: DC | PRN

## 2014-08-02 MED ORDER — OXYCODONE-ACETAMINOPHEN 5-325 MG PO TABS
1.0000 | ORAL_TABLET | Freq: Once | ORAL | Status: AC
  Administered 2014-08-02: 1 via ORAL
  Filled 2014-08-02: qty 1

## 2014-08-02 NOTE — Discharge Instructions (Signed)
Naprosyn for pain. Ultram for severe pain. Keep arm in the sling. Ice. Stretch. If pain continues in several days, follow up with orthopedics specialist.   Shoulder Pain The shoulder is the joint that connects your arms to your body. The bones that form the shoulder joint include the upper arm bone (humerus), the shoulder blade (scapula), and the collarbone (clavicle). The top of the humerus is shaped like a ball and fits into a rather flat socket on the scapula (glenoid cavity). A combination of muscles and strong, fibrous tissues that connect muscles to bones (tendons) support your shoulder joint and hold the ball in the socket. Small, fluid-filled sacs (bursae) are located in different areas of the joint. They act as cushions between the bones and the overlying soft tissues and help reduce friction between the gliding tendons and the bone as you move your arm. Your shoulder joint allows a wide range of motion in your arm. This range of motion allows you to do things like scratch your back or throw a ball. However, this range of motion also makes your shoulder more prone to pain from overuse and injury. Causes of shoulder pain can originate from both injury and overuse and usually can be grouped in the following four categories:  Redness, swelling, and pain (inflammation) of the tendon (tendinitis) or the bursae (bursitis).  Instability, such as a dislocation of the joint.  Inflammation of the joint (arthritis).  Broken bone (fracture). HOME CARE INSTRUCTIONS   Apply ice to the sore area.  Put ice in a plastic bag.  Place a towel between your skin and the bag.  Leave the ice on for 15-20 minutes, 3-4 times per day for the first 2 days, or as directed by your health care provider.  Stop using cold packs if they do not help with the pain.  If you have a shoulder sling or immobilizer, wear it as long as your caregiver instructs. Only remove it to shower or bathe. Move your arm as little as  possible, but keep your hand moving to prevent swelling.  Squeeze a soft ball or foam pad as much as possible to help prevent swelling.  Only take over-the-counter or prescription medicines for pain, discomfort, or fever as directed by your caregiver. SEEK MEDICAL CARE IF:   Your shoulder pain increases, or new pain develops in your arm, hand, or fingers.  Your hand or fingers become cold and numb.  Your pain is not relieved with medicines. SEEK IMMEDIATE MEDICAL CARE IF:   Your arm, hand, or fingers are numb or tingling.  Your arm, hand, or fingers are significantly swollen or turn white or blue. MAKE SURE YOU:   Understand these instructions.  Will watch your condition.  Will get help right away if you are not doing well or get worse. Document Released: 04/20/2005 Document Revised: 11/25/2013 Document Reviewed: 06/25/2011 [facility] Patient Information 2015 [place], [place]. This information is not intended to replace advice given to you by your health care provider. Make sure you discuss any questions you have with your health care provider.

## 2014-08-02 NOTE — ED Notes (Addendum)
Pt reports that rolled over last night and thinks shoulder went out of joint. Pain down neck to finger tips. Fingers and hand tingling. Has ROM with pain. Had shoulder surgery "bone scraping" 15 years ago on right shoulder. Chart shows rotator cuff surgery 2013.

## 2014-08-02 NOTE — ED Notes (Signed)
PA at bedside.

## 2014-08-02 NOTE — ED Notes (Signed)
Pt in xray

## 2014-08-02 NOTE — ED Provider Notes (Signed)
CSN: [id number]     Arrival date & time 08/02/14  0944 History   First MD Initiated Contact with Patient 08/02/14 [phone]     Chief Complaint  Patient presents with  . Shoulder Pain     (Consider location/radiation/quality/duration/timing/severity/associated sxs/prior Treatment) HPI Annette Chang is a 44 y.o. female who presents to emergency department complaining of right shoulder pain. Patient states she rolled over last night while asleep and felt shoulder popped a few times. States this morning when she got up she tried to pick up her grandchild and states she couldn't move her arm. She reports pain in the shoulder. She denies weakness or numbness in her hand. She states pain radiates all the way down her arm. She reports prior shoulder surgery 15 years ago, states "they scraped some bones." Patient states she thinks that the shoulder looks different from the left one and "sinks down" when she stands. She did not take anything for the pain prior to coming in. No other complaints.  Past Medical History  Diagnosis Date  . 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 H/O rotator cuff surgery 01/02/2012  . H/O tubal ligation 01/02/2012  . H/O: C-section 01/02/2012    3   . Headache(784.0) 01/02/2012  . Iron deficiency anemia 01/02/2012  . H/O: C-section 01/02/2012    3   . Depression    Past Surgical History  Procedure Laterality Date  . Shoulder surgery    . Cesarean section      3 previous c sections  . Rotator cuff surgery    . Tubal ligation    . Novasure ablation N/A 03/11/2014    Procedure: NOVASURE ABLATION;  Surgeon: Emily Filbert, MD;  Location: [facility];  Service: Gynecology;  Laterality: N/A;   Family History  Problem Relation Age of Onset  . Diabetes Maternal Grandmother   . Diabetes Paternal Grandmother   . Diabetes Paternal Grandfather    History  Substance Use Topics  . Smoking status: Never Smoker   . Smokeless tobacco: Never Used  . Alcohol Use: Yes   Comment: weekends   OB History    Gravida Para Term Preterm AB TAB SAB Ectopic Multiple Living   3 3 3       2      Review of Systems  Constitutional: Negative for fever and chills.  Respiratory: Negative for cough, chest tightness and shortness of breath.   Cardiovascular: Negative for chest pain, palpitations and leg swelling.  Musculoskeletal: Positive for arthralgias.  Skin: Negative for rash.  Neurological: Negative for dizziness, weakness and headaches.  All other systems reviewed and are negative.     Allergies  Percocet  Home Medications   Prior to Admission medications   Medication Sig Start Date End Date Taking? Authorizing Provider  Calcium Carbonate-Vitamin D (CALCIUM-VITAMIN D) 500-200 MG-UNIT per tablet Take 1 tablet by mouth daily.    Historical Provider, MD  ibuprofen (ADVIL,MOTRIN) 600 MG tablet Take 1 tablet (600 mg total) by mouth every 6 (six) hours as needed. 03/11/14   Emily Filbert, MD  multivitamin-iron-minerals-folic acid (CENTRUM) chewable tablet Chew 1 tablet by mouth daily.    Historical Provider, MD  naproxen sodium (ANAPROX) 220 MG tablet Take 220 mg by mouth 2 (two) times daily as needed.    Historical Provider, MD  Omega-3 Fatty Acids (FISH OIL PO) Take 1 capsule by mouth daily.    Historical Provider, MD  sulfamethoxazole-trimethoprim (BACTRIM DS) 800-160 MG per tablet Take 1 tablet  by mouth 2 (two) times daily. 05/05/14   Emily Filbert, MD  vitamin C (ASCORBIC ACID) 500 MG tablet Take 500 mg by mouth daily.    Historical Provider, MD   BP 135/79 mmHg  Pulse 78  Temp(Src) 98.2 F (36.8 C) (Oral)  Resp 18  SpO2 99% Physical Exam  Constitutional: She appears well-developed and well-nourished. No distress.  HENT:  Head: Normocephalic.  Eyes: Conjunctivae are normal.  Neck: Neck supple.  Cardiovascular: Normal rate, regular rhythm and normal heart sounds.   Pulmonary/Chest: Effort normal and breath sounds normal. No respiratory distress. She has  no wheezes. She has no rales.  Musculoskeletal: She exhibits no edema.  Normal appearing right shoulder. Tender over posterior joint. Pain with shoulder flexion, abduction, internal or external rotation. Grip strength, bicep and trice strength is 5/5. Distal radial pulse intact. Hand is warm.   Neurological: She is alert.  Skin: Skin is warm and dry.  Psychiatric: She has a normal mood and affect. Her behavior is normal.  Nursing note and vitals reviewed.   ED Course  Procedures (including critical care time) Labs Review Labs Reviewed - No data to display  Imaging Review Dg Shoulder Right  08/02/2014   CLINICAL DATA:  Right shoulder pain and limited range of motion.  EXAM: RIGHT SHOULDER - 2+ VIEW  COMPARISON:  07/06/2010.  FINDINGS: Multiple views of the right shoulder demonstrate no acute displaced fracture, subluxation, dislocation, or soft tissue abnormality.  IMPRESSION: No acute radiographic abnormality of the right shoulder.   Electronically Signed   By: Vinnie Langton M.D.   On: 08/02/2014 11:41     EKG Interpretation None      MDM   Final diagnoses:  Shoulder pain    Recent with right shoulder pain after rolling on it at night, now pain with movement. She is neurovascularly intact. X-ray obtained is negative. Will place in sling, home with naproxen and Ultram. Follow up with orthopedic specialist.  Filed Vitals:   08/02/14 0957 08/02/14 1015 08/02/14 1045 08/02/14 1200  BP: 135/79 124/79 108/76 118/89  Pulse: 78 79 72 70  Temp: 98.2 F (36.8 C)     TempSrc: Oral     Resp: 18     SpO2: 99% 99% 98% 99%     Renold Genta, PA-C 08/02/14 [place], MD 08/02/14 1222

## 2014-08-02 NOTE — Progress Notes (Signed)
ED CM spoke with pataient regarding PCP f/u. Patient states, she is a patient at [facility] , She has a f/u appt.1/29 at 3p. Patient has [place] denies any issue obtaining medications. No further ED CM needs identified.

## 2014-08-02 NOTE — ED Notes (Signed)
Pt reports that he pain did improve but has returned since xray moved it. PA aware.

## 2014-08-02 NOTE — ED Notes (Signed)
Pt reports doing heavy lifting with grandson lately.

## 2014-08-02 NOTE — ED Notes (Signed)
PT ambulated with baseline gait; VSS; A&Ox3; no signs of distress; respirations even and unlabored; skin warm and dry; no questions upon discharge.  

## 2014-08-22 ENCOUNTER — Ambulatory Visit (INDEPENDENT_AMBULATORY_CARE_PROVIDER_SITE_OTHER): Payer: Medicaid Other | Admitting: Family Medicine

## 2014-08-22 ENCOUNTER — Encounter: Payer: Self-pay | Admitting: Family Medicine

## 2014-08-22 VITALS — BP 102/67 | HR 87 | Temp 98.1°F | Ht 61.0 in | Wt 91.1 lb

## 2014-08-22 DIAGNOSIS — E0590 Thyrotoxicosis, unspecified without thyrotoxic crisis or storm: Secondary | ICD-10-CM

## 2014-08-22 DIAGNOSIS — M25511 Pain in right shoulder: Secondary | ICD-10-CM

## 2014-08-22 NOTE — Assessment & Plan Note (Addendum)
Has components of rotator cuff syndrome but also abnormal movement of scapula. Hx of rotator cuff surgery that she reports occurred in her 40's.  - naproxen 500 mg BID for 2 weeks then PRN  - tramadol PRN for severe pain  - given home modalities and thera ban  - referral to sports medicine for scapula evaluation and ultrasound evaluation of rotator cuff

## 2014-08-22 NOTE — Patient Instructions (Signed)
Thank you for coming in,   Take two of the 220 mg naproxen two times a day for the next two weeks.   You can tramadol if the pain gets to be too severe.   I will give you some exercises for your shoulder. You can ice your shoulder for 20 minutes, about 4 times a day.    Please feel free to call with any questions or concerns at any time, at [phone]. --Dr. Raeford Razor

## 2014-08-22 NOTE — Progress Notes (Signed)
   Subjective:    Patient ID: Annette Chang, female    DOB: May 01, 1971, 44 y.o.   MRN: [id number]  HPI  Annette Chang is here for right shoulder pain and surgical f/u.  Right shoulder pain: prior shoulder surgery when she was in her 20's. She went to the ED for the right shoulder pain. The pain started 3 weeks ago. She woke up with the pain. Pain radiates down the anterior aspect of the arm. Pain is sharp and 8/10.  Pain is improved with medications. Constant every day and worse with lifting anything. No numbness or tingling in her shoulder. No history of trauma.   Endometrial ablation f/u: she tolerated the procedure well. She isn't bleeding like she previously was. The pain is well controlled with medications. She does has some spotting but no bleeding like she previously had.   Current Outpatient Prescriptions on File Prior to Visit  Medication Sig Dispense Refill  . Calcium Carbonate-Vitamin D (CALCIUM-VITAMIN D) 500-200 MG-UNIT per tablet Take 1 tablet by mouth daily.    Marland Kitchen ibuprofen (ADVIL,MOTRIN) 600 MG tablet Take 1 tablet (600 mg total) by mouth every 6 (six) hours as needed. 30 tablet 1  . multivitamin-iron-minerals-folic acid (CENTRUM) chewable tablet Chew 1 tablet by mouth daily.    . naproxen sodium (ANAPROX) 220 MG tablet Take 220 mg by mouth 2 (two) times daily as needed.    . Omega-3 Fatty Acids (FISH OIL PO) Take 1 capsule by mouth daily.    . traMADol (ULTRAM) 50 MG tablet Take 1 tablet (50 mg total) by mouth every 6 (six) hours as needed. 15 tablet 0  . vitamin C (ASCORBIC ACID) 500 MG tablet Take 500 mg by mouth daily.    . naproxen (NAPROSYN) 500 MG tablet Take 1 tablet (500 mg total) by mouth 2 (two) times daily. (Patient not taking: Reported on 08/22/2014) 30 tablet 0  . sulfamethoxazole-trimethoprim (BACTRIM DS) 800-160 MG per tablet Take 1 tablet by mouth 2 (two) times daily. (Patient not taking: Reported on 08/02/2014) 14 tablet 0   No current facility-administered  medications on file prior to visit.    Health Maintenance: Needs mammogram     Review of Systems See HPI     Objective:   Physical Exam BP 102/67 mmHg  Pulse 87  Temp(Src) 98.1 F (36.7 C) (Oral)  Ht 5\' 1"  (1.549 m)  Wt 91 lb 1.6 oz (41.323 kg)  BMI 17.22 kg/m2 Gen: NAD, alert, cooperative with exam, well-appearing Shoulder: Laterality: right Appearance: right shoulder hangs lower as compared to left.  Tenderness: yes  lateral Range of Motion: Passive abduction: normal Flexion:normal Active abduction: limited to 90 degrees Flexion: limited to 90 degrees Maneuvers: Empty can:pos on right  Internal rotation: normal  External rotation:normal  Hawkin's test: pos Abnormal scapular function observed with passive abduction of arm  No winging of scapula  No painful arc and no drop arm sign. Neurovascularly intact  Strength:  Bicep: 5/5 Grip: 5/5    Assessment & Plan:

## 2014-08-23 LAB — TSH: TSH: 0.416 u[IU]/mL (ref 0.350–4.500)

## 2014-08-25 ENCOUNTER — Encounter: Payer: Self-pay | Admitting: Family Medicine

## 2014-08-26 ENCOUNTER — Other Ambulatory Visit: Payer: Self-pay | Admitting: Family Medicine

## 2014-08-26 DIAGNOSIS — M25511 Pain in right shoulder: Secondary | ICD-10-CM

## 2014-08-26 NOTE — Telephone Encounter (Signed)
Needs refill on tramadol and naproxen

## 2014-08-27 MED ORDER — TRAMADOL HCL 50 MG PO TABS: 50.0000 mg | ORAL_TABLET | Freq: Four times a day (QID) | ORAL | Status: DC | PRN

## 2014-08-27 MED ORDER — NAPROXEN 500 MG PO TABS: 500.0000 mg | ORAL_TABLET | Freq: Two times a day (BID) | ORAL | Status: DC

## 2014-08-27 NOTE — Telephone Encounter (Signed)
LVM for pt to return call to let her know that her Rx's would be up front for her to pick up. Annette Chang, Annette Chang

## 2014-09-01 ENCOUNTER — Ambulatory Visit: Payer: Medicaid Other | Admitting: Sports Medicine

## 2014-10-06 ENCOUNTER — Ambulatory Visit (INDEPENDENT_AMBULATORY_CARE_PROVIDER_SITE_OTHER): Payer: Medicaid Other | Admitting: Sports Medicine

## 2014-10-06 ENCOUNTER — Encounter: Payer: Self-pay | Admitting: Sports Medicine

## 2014-10-06 VITALS — BP 111/75 | HR 75 | Ht 61.0 in | Wt 98.0 lb

## 2014-10-06 DIAGNOSIS — M958 Other specified acquired deformities of musculoskeletal system: Secondary | ICD-10-CM

## 2014-10-06 DIAGNOSIS — M25511 Pain in right shoulder: Secondary | ICD-10-CM

## 2014-10-06 NOTE — Progress Notes (Signed)
  Annette Chang - 44 y.o. female MRN [id number]  Date of birth: May 24, 1971  SUBJECTIVE:  Including CC & ROS.  Annette Chang is a 44 y.o. female presenting as a new patient for right shoulder pain and weakness; she is right handed. Prior history of right shoulder surgery about 25 years ago, unable to state exactly what surgery was for but describes "shaving" and "cleaning bone". She reports good recovery from that surgery and did not have any issues for years. She is unable to specify when this started happening, but has had issue with raising her right arm and pain in her shoulder that will radiate down the arm for at least the past several years, likely longer. She can use her left arm to raise her right arm above her shoulder for tasks like brushing her hair. She denies any history of trauma. She gets pain about every other day that limits her ability to use the arm, primarily in the posterior shoulder but has some in her neck and radiating down into all fingers (no specific ones worse). She says the only thing that makes it worse is continued activity, such as cooking/stirring a pot, where she says after a while her arm "gives out" and she has to rest it. 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   HISTORY: Past Medical, Surgical, Social, and Family History Reviewed & Updated per EMR.   Pertinent Historical Findings include: Distant right shoulder surgery 20-25 years ago  DATA REVIEWED: Right shoulder x-ray Jan 2016  PHYSICAL EXAM:  VS: BP:111/75 mmHg  HR:75bpm  TEMP: ( )  RESP:   HT:5\' 1"  (154.9 cm)   WT:98 lb (44.453 kg)  BMI:18.6 PHYSICAL EXAM: General: NAD, thin Rt shoulder:  INSPECTION: significant winged scapula on inspection. Rt shoulder droop with arms by side at rest.  PALPATION: tender posterior RANGE OF MOTION: limited abduction to about 70 degrees. Rt Scapula does not move well during ROM, during abduction actually rotates inward/medially opposite of  normal STRENGTH: Biceps 5/5 bilaterally, triceps 4+/5 on right, 5/5 on left. Grip strength 5/5 b/l.  SPECIAL TESTS: Empty can +, Hawkins +.  NEUROVASCULAR STATUS: intact. Symmetric biceps reflexes 2+, triceps 1+ bilateral, brachioradialis 1+ bilateral Lt shoulder:  INSPECTION: slight winging of scapula  ASSESSMENT & PLAN: See problem based charting & AVS for pt instructions.

## 2014-10-06 NOTE — Assessment & Plan Note (Addendum)
Concern for nerve injury, unable to determine if c-spine nerve root or more distal. Has winged scapula but additional issues that would not necessarily be explained only by long thoracic nerve injury including lack of mobility of the scapula. No reported history of trauma to explain this. Will get RUE EMG/NCS to eval for nerve damage, further workup pending this result. Follow up to be discussed after EMG result.

## 2014-10-13 ENCOUNTER — Telehealth: Payer: Self-pay | Admitting: *Deleted

## 2014-10-13 DIAGNOSIS — M958 Other specified acquired deformities of musculoskeletal system: Secondary | ICD-10-CM

## 2014-10-13 NOTE — Telephone Encounter (Signed)
-----   Message from Cyd Silence, [place] sent at 10/13/2014 10:36 AM EDT ----- Regarding: FW: phone message Contact: [phone] This is the one that needed the EMG study that draper was speaking about that you were working on ----- Message -----    From: Carolyne Littles    Sent: 10/13/2014   8:41 AM      To: Cyd Silence, CMA Subject: phone message                                  Pt states when she called [facility] they said Dr.Wang cant do anything for her. She wants you to call her.  This is the pt I  sent you a message about early from [facility].

## 2014-10-13 NOTE — Telephone Encounter (Signed)
Pt needs to be seen by a neurologist either Dr. Melton Alar [phone] or Dr. Krista Blue [phone], for her EMG study (see Dr. Alinda Money note in last visit).  Since this pt is [place] [facility] this referral needs to come from her PCP. I will call pt so she is aware of this change and will wait to hear from [facility] about her appt or from the neurologist.

## 2014-10-21 ENCOUNTER — Telehealth: Payer: Self-pay | Admitting: *Deleted

## 2014-10-21 NOTE — Telephone Encounter (Signed)
Spoke with pt to let her know that her referral had been put in and she should hear from [facility] for her appt. Katharina Caper, April D

## 2014-11-06 ENCOUNTER — Encounter: Payer: Self-pay | Admitting: Neurology

## 2014-11-06 ENCOUNTER — Ambulatory Visit (INDEPENDENT_AMBULATORY_CARE_PROVIDER_SITE_OTHER): Payer: Medicaid Other | Admitting: Neurology

## 2014-11-06 ENCOUNTER — Ambulatory Visit (INDEPENDENT_AMBULATORY_CARE_PROVIDER_SITE_OTHER): Payer: Self-pay | Admitting: Neurology

## 2014-11-06 DIAGNOSIS — M958 Other specified acquired deformities of musculoskeletal system: Secondary | ICD-10-CM

## 2014-11-06 DIAGNOSIS — R202 Paresthesia of skin: Secondary | ICD-10-CM

## 2014-11-06 DIAGNOSIS — M25511 Pain in right shoulder: Secondary | ICD-10-CM | POA: Diagnosis not present

## 2014-11-06 NOTE — Progress Notes (Signed)
Please refer to EMG and NCV note.

## 2014-11-06 NOTE — Procedures (Signed)
     HISTORY:  Annette Chang is a 44 year old patient with a two-month history of pain around the left shoulder and scapular area, with some paresthesias into the thumb and index finger on the right hand. The patient is being evaluated for the shoulder weakness.  NERVE CONDUCTION STUDIES:  Nerve conduction studies were performed on the right upper extremity. The distal motor latencies and motor amplitudes for the median and ulnar nerves were within normal limits. The F wave latencies and nerve conduction velocities for these nerves were also normal. The sensory latencies for the median, radial, and ulnar nerves were normal. The lateral antebrachial cutaneous sensory latencies were within normal limits and symmetric one side to the next.   EMG STUDIES:  EMG study was performed on the right upper extremity:  The first dorsal interosseous muscle reveals 2 to 4 K units with full recruitment. No fibrillations or positive waves were noted. The abductor pollicis brevis muscle reveals 2 to 4 K units with full recruitment. No fibrillations or positive waves were noted. The extensor indicis proprius muscle reveals 1 to 3 K units with full recruitment. No fibrillations or positive waves were noted. The pronator teres muscle reveals 2 to 3 K units with full recruitment. No fibrillations or positive waves were noted. The biceps muscle reveals 1 to 2 K units with full recruitment. No fibrillations or positive waves were noted. The triceps muscle reveals 2 to 4 K units with full recruitment. No fibrillations or positive waves were noted. The anterior deltoid muscle reveals 2 to 3 K units with full recruitment. No fibrillations or positive waves were noted. The upper, mid, and lower trapezius muscle was tested revealing 1 to 3 K motor units, with full recruitment. No fibrillations or positive waves were seen. The serratus anterior muscle was tested and revealed 1 to 2 K motor units, decreased recruitment.  1+ fibrillations were seen. The cervical paraspinal muscles were tested at 2 levels. No abnormalities of insertional activity were seen at either level tested. There was fair relaxation.   IMPRESSION:  Nerve conduction studies done on the right upper extremity were unremarkable, without evidence of a peripheral neuropathy. 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 No evidence of a cervical radiculopathy was seen.  Jill Alexanders MD 11/06/2014 11:09 AM  [facility] [address] [place], [place] [address]  Phone [phone] Fax [phone]

## 2014-11-19 ENCOUNTER — Ambulatory Visit: Payer: Medicaid Other | Admitting: Obstetrics & Gynecology

## 2014-12-24 ENCOUNTER — Telehealth: Payer: Self-pay | Admitting: Family Medicine

## 2014-12-24 NOTE — Telephone Encounter (Signed)
Had some tests done in April (electric nerve shock) in April Has never received any word about theim Was told her PCP would be the one to tell her the results

## 2014-12-25 NOTE — Telephone Encounter (Signed)
Spoke with patient about her nerve conduction studies. She isn't having problems with pain but she is a still having her scapula problems. When she is active she feels better and is worse when she isn't moving.   Spoke with Dr. Micheline Chapman and recommended continuing home modalities and f/u with him in 2 weeks.   Spoke with patient this information and she understood. She has f/u schedule for the 13th at 11 am.   Rosemarie Ax, MD PGY-2, [facility] 12/25/2014, 9:02 AM

## 2015-01-05 ENCOUNTER — Ambulatory Visit (INDEPENDENT_AMBULATORY_CARE_PROVIDER_SITE_OTHER): Payer: Medicaid Other | Admitting: Sports Medicine

## 2015-01-05 ENCOUNTER — Encounter: Payer: Self-pay | Admitting: Sports Medicine

## 2015-01-05 VITALS — BP 132/88 | HR 66 | Ht 61.0 in | Wt 95.0 lb

## 2015-01-05 DIAGNOSIS — M958 Other specified acquired deformities of musculoskeletal system: Secondary | ICD-10-CM

## 2015-01-05 NOTE — Progress Notes (Addendum)
Subjective: Annette Chang is a 44 y.o. right-hand dominant female with a PMH of iron deficiency anemia, depression, and right shoulder arthritis, seen today for followup of her right  winged scapula. She was last seen in this clinic on 10/06/2014. 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 She states that she is unable to raise her right arm above her shoulder except by lifting it with the opposite hand, and states that she has occasional shooting pain down her right arm. Moreover, she still notes a visible deformity of her right shoulder when she looks over her shoulder in the mirror; this is unchanged since her last visit. She reports that she has seen one physical therapist but was advised that there was "nothing they could do" for her. Despite her pain and disability she has been able to continue working.  Objective: BP 132/88 mmHg  Pulse 66  Ht 5' 1"  (1.549 m)  Wt 95 lb (43.092 kg)  BMI 17.96 kg/m2  Right shoulder -- Inspection: significant winged scapula on inspection. Right shoulder droop with arms by side at rest.  Palpation: No real tenderness today on palpation. Range of motion: Very limited abduction; unable to abduct actively past about 70 degrees. Scapula does not move well throughout ROM; rotates inward/medially during abduction. Passive range of motion is significant for pain on abduction past 80 degrees. Strength: profoundly diminished strength for abduction. Intact strength through biceps, triceps.  Special tests: Empty can+, Internal rotation lag test -, drop arm test - (although limited to ROM)  We also have her EMG results - EMG was significant for some denervation of the serratus anterior muscle consistent with long thoracic neuropathy, but no cervical radiculopathy and and nerve conduction through the right upper extremity was unremarkable.   Assessment/Plan: Annette Chang is a 44 y.o. female with a PMH of iron deficiency anemia, depression, and right shoulder arthritis, seen for followup of her winged scapula on the right side, with resulting weakness and pain on abduction. Her exam and symptoms are basically unchanged today versus her last visit in March. We emphasized the need for close followup with this clinic so that we can execute a good diagnostic and therapeutic plan. We have referred her to an orthopaedist at [facility] for an appointment later this week and have asked her to follow-up with this clinic after that time. In light of her reported family history of neuropathies, we have ordered CBC, CMP, ESR, CRP, ANA, RF, and Anti-CCP labs to as a basic rheumatologic panel to assess for possible lupus or other diseases as an etiology for her neuropathy. Will follow-up with her after she seens the orthopaedic surgeon at [facility].  Note dictated by Yvone Neu, MS-4  Patient seen and evaluated with the medical student. I agree with the above plan of care. I would like to get the input of Dr. Mardelle Matte in this interesting case. Although she definitely has long thoracic nerve involvement her degree of disability is quite profound. I want to ensure that she has no operative pathology. If Dr. Mardelle Matte agrees that I would consider referral to neurology. I did provide the patient with an AVS today with her diagnosis on it and I've encouraged her to educate herself about this process.

## 2015-01-05 NOTE — Patient Instructions (Signed)
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

## 2015-01-06 ENCOUNTER — Other Ambulatory Visit: Payer: Self-pay | Admitting: Family Medicine

## 2015-01-06 DIAGNOSIS — M25511 Pain in right shoulder: Secondary | ICD-10-CM

## 2015-02-20 ENCOUNTER — Other Ambulatory Visit: Payer: Self-pay | Admitting: *Deleted

## 2015-02-20 ENCOUNTER — Ambulatory Visit (INDEPENDENT_AMBULATORY_CARE_PROVIDER_SITE_OTHER): Payer: Medicaid Other | Admitting: Family Medicine

## 2015-02-20 ENCOUNTER — Encounter: Payer: Self-pay | Admitting: Family Medicine

## 2015-02-20 VITALS — BP 114/91 | HR 65 | Temp 98.1°F | Wt 92.0 lb

## 2015-02-20 DIAGNOSIS — R51 Headache: Secondary | ICD-10-CM

## 2015-02-20 DIAGNOSIS — G4484 Primary exertional headache: Secondary | ICD-10-CM

## 2015-02-20 DIAGNOSIS — M958 Other specified acquired deformities of musculoskeletal system: Secondary | ICD-10-CM

## 2015-02-20 DIAGNOSIS — R519 Headache, unspecified: Secondary | ICD-10-CM | POA: Insufficient documentation

## 2015-02-20 LAB — CBC
HEMATOCRIT: 39.1 % (ref 36.0–46.0)
Hemoglobin: 13.3 g/dL (ref 12.0–15.0)
MCH: 30.7 pg (ref 26.0–34.0)
MCHC: 34 g/dL (ref 30.0–36.0)
MCV: 90.3 fL (ref 78.0–100.0)
MPV: 10 fL (ref 8.6–12.4)
PLATELETS: 197 10*3/uL (ref 150–400)
RBC: 4.33 MIL/uL (ref 3.87–5.11)
RDW: 13.1 % (ref 11.5–15.5)
WBC: 3.1 10*3/uL — ABNORMAL LOW (ref 4.0–10.5)

## 2015-02-20 LAB — RHEUMATOID FACTOR: Rhuematoid fact SerPl-aCnc: <10 IU/mL (ref <=14)

## 2015-02-20 MED ORDER — NAPROXEN SODIUM 220 MG PO TABS: 220.0000 mg | ORAL_TABLET | Freq: Two times a day (BID) | ORAL | Status: DC | PRN

## 2015-02-20 NOTE — Patient Instructions (Signed)
Take frequent breaks to get into the cool and drink water while being outside Return if you notice blurred vision, problems with speaking, change in headaches, or weakness.  General Headache Without Cause A headache is pain or discomfort felt around the head or neck area. The specific cause of a headache may not be found. There are many causes and types of headaches. A few common ones are:  Tension headaches.  Migraine headaches.  Cluster headaches.  Chronic daily headaches. HOME CARE INSTRUCTIONS   Keep all follow-up appointments with your caregiver or any specialist referral.  Only take over-the-counter or prescription medicines for pain or discomfort as directed by your caregiver.  Stay well hydrated!  Lie down in a dark, quiet room when you have a headache.  Keep a headache journal to find out what may trigger your migraine headaches. For example, write down:  What you eat and drink.  How much sleep you get.  Any change to your diet or medicines.  Try massage or other relaxation techniques.  Put ice packs or heat on the head and neck. Use these 3 to 4 times per day for 15 to 20 minutes each time, or as needed.  Limit stress.  Sit up straight, and do not tense your muscles.  Quit smoking if you smoke.  Limit alcohol use.  Decrease the amount of caffeine you drink, or stop drinking caffeine.  Eat and sleep on a regular schedule.  Get 7 to 9 hours of sleep, or as recommended by your caregiver.  Keep lights dim if bright lights bother you and make your headaches worse. SEEK MEDICAL CARE IF:   You have problems with the medicines you were prescribed.  Your medicines are not working.  You have a change from the usual headache.  You have nausea or vomiting. SEEK IMMEDIATE MEDICAL CARE IF:   Your headache becomes severe.  You have a fever.  You have a stiff neck.  You have loss of vision.  You have muscular weakness or loss of muscle control.  You  start losing your balance or have trouble walking.  You feel faint or pass out.  You have severe symptoms that are different from your first symptoms. MAKE SURE YOU:   Understand these instructions.  Will watch your condition.  Will get help right away if you are not doing well or get worse. Document Released: 07/11/2005 Document Revised: 10/03/2011 Document Reviewed: 07/27/2011 [facility] Patient Information 2015 [place], [place]. This information is not intended to replace advice given to you by your health care provider. Make sure you discuss any questions you have with your health care provider.

## 2015-02-20 NOTE — Progress Notes (Signed)
Patient ID: Annette Chang, female   DOB: 03/15/1971, 43 y.o.   MRN: [id number]    Subjective: [id number] HPI: Patient is a 44 y.o. female presenting to [facility] clinic today for headaches.  HEADACHE Headache started 14 days ago, normally occurs around 11-12 (wakes up at 9am, sometimes goes outside in the AM to water the plants and spends about 1 hr outside). When she goes back inside the headache occurs.   Pain is throbbing in nature.  Severity:  7/10  Location: frontal  Medications tried: Sometimes resolves spontaneously, othertimes alleviatde with Ibuprofen or Aleve.  Head trauma: No  Sudden onset: No, gradually comes one when coming inside Previous similar headaches: none Taking blood thinners: none  History of cancer: none  Symptoms Nose congestion stuffiness:  None  Nausea vomiting: nausea but not vomiting Photophobia: No Noise sensitivity: no Double vision or loss of vision: no  Fever: no  Neck Stiffness: no  Trouble walking or speaking: no   Patient thinks cause of headache might be: heat as her headaches occur almost immediate after coming inside. Also notes a headaches "minutes after eating biscuits" or fried eggs- worried her cholesterol is high which could be causing her headaches.   Social History: smokes 1 cig/day.    ROS: All other systems reviewed and are negative.  Past Medical History Patient Active Problem List   Diagnosis Date Noted  . Headache 02/20/2015  . 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 Right shoulder pain 08/22/2014  . DUB (dysfunctional uterine bleeding) 03/11/2014  . Breast cancer screening 10/10/2012  . Screening for cervical cancer 05/03/2012  . H/O rotator cuff surgery 01/02/2012  . 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 H/O: C-section 01/02/2012  . Iron deficiency anemia 01/02/2012  . H/O tubal ligation 01/02/2012  . Depression 01/02/2012    Medications- reviewed and updated Current Outpatient Prescriptions    Medication Sig Dispense Refill  . Calcium Carbonate-Vitamin D (CALCIUM-VITAMIN D) 500-200 MG-UNIT per tablet Take 1 tablet by mouth daily.    Marland Kitchen ibuprofen (ADVIL,MOTRIN) 600 MG tablet Take 1 tablet (600 mg total) by mouth every 6 (six) hours as needed. 30 tablet 1  . multivitamin-iron-minerals-folic acid (CENTRUM) chewable tablet Chew 1 tablet by mouth daily.    . naproxen (NAPROSYN) 500 MG tablet Take 1 tablet (500 mg total) by mouth 2 (two) times daily. 30 tablet 0  . naproxen sodium (ANAPROX) 220 MG tablet Take 1 tablet (220 mg total) by mouth 2 (two) times daily as needed. 30 tablet 0  . Omega-3 Fatty Acids (FISH OIL PO) Take 1 capsule by mouth daily.    Marland Kitchen sulfamethoxazole-trimethoprim (BACTRIM DS) 800-160 MG per tablet Take 1 tablet by mouth 2 (two) times daily. (Patient not taking: Reported on 08/02/2014) 14 tablet 0  . traMADol (ULTRAM) 50 MG tablet Take 1 tablet (50 mg total) by mouth every 6 (six) hours as needed. 15 tablet 0  . vitamin C (ASCORBIC ACID) 500 MG tablet Take 500 mg by mouth daily.     No current facility-administered medications for this visit.    Objective: Office vital signs reviewed. BP 114/91 mmHg  Pulse 65  Temp(Src) 98.1 F (36.7 C) (Oral)  Wt 92 lb (41.731 kg)  LMP 02/13/2015   Physical Examination:  General: Awake, alert, well- nourished, NAD ENMT:  TMs intact, normal light reflex, no erythema, no bulging. Nasal turbinates moist. MMM, Oropharynx clear without erythema or tonsillar exudate/hypertrophy Eyes: Conjunctiva non-injected. PERRL.  Cardio: RRR, no m/r/g noted.  No LE edema.  Pulm: No increased WOB.  CTAB, without wheezes, rhonchi or crackles noted.  MSK: Normal gait and station Skin: dry, intact, no rashes or lesions Neuro: Speech clear without slurring. Smile symmetric, forehead movements symmetric. Tongue/uvula midline. PERRL. EOMI without nystagmus.Symmetric shoulder shrug. Normal/symmetric grip. Unable to assess UE strength due to winged  scapula on R. 5/5 strength in LE b/l. Normal finger to nose. Sensation intact grossly.   Assessment/Plan: Headache Patient presenting with headaches that occur after working outside in the heat most likely 2/2 exertion and dehydration. No allergic type symptoms. No h/o trauma. Often resolves spontaneously and no neurologic deficits noted on exam therefore less concerning for increased ICP (mass vs bleed). No lacrimation concerning for cluster type headaches. Does not sound like migraines as it is bilateral without photophobia. - Dicussed staying hydrated with frequent breaks while outside. -NSAIDs PRN headaches - RTC precautions dicussed: worsening/changing headaches, slurred speech, balance issues, weakness, vision changes. Pt voiced understanding.      No orders of the defined types were placed in this encounter.    Meds ordered this encounter  Medications  . naproxen sodium (ANAPROX) 220 MG tablet    Sig: Take 1 tablet (220 mg total) by mouth 2 (two) times daily as needed.    Dispense:  30 tablet    Refill:  [facility] PGY-2, [place]

## 2015-02-20 NOTE — Assessment & Plan Note (Addendum)
Patient presenting with headaches that occur after working outside in the heat most likely 2/2 exertion and dehydration. No allergic type symptoms. No h/o trauma. Often resolves spontaneously and no neurologic deficits noted on exam therefore less concerning for increased ICP (mass vs bleed). No lacrimation concerning for cluster type headaches. Does not sound like migraines as it is bilateral without photophobia. - Dicussed staying hydrated with frequent breaks while outside. -NSAIDs PRN headaches - RTC precautions dicussed: worsening/changing headaches, slurred speech, balance issues, weakness, vision changes. Pt voiced understanding.

## 2015-02-21 LAB — SEDIMENTATION RATE: Sed Rate: 1 mm/hr (ref 0–20)

## 2015-02-23 LAB — CYCLIC CITRUL PEPTIDE ANTIBODY, IGG: Cyclic Citrullin Peptide Ab: <2 U/mL (ref 0.0–5.0)

## 2015-04-13 ENCOUNTER — Ambulatory Visit (INDEPENDENT_AMBULATORY_CARE_PROVIDER_SITE_OTHER): Payer: Medicaid Other | Admitting: Family Medicine

## 2015-04-13 ENCOUNTER — Other Ambulatory Visit: Payer: Self-pay | Admitting: Family Medicine

## 2015-04-13 ENCOUNTER — Encounter: Payer: Self-pay | Admitting: Family Medicine

## 2015-04-13 ENCOUNTER — Ambulatory Visit (HOSPITAL_COMMUNITY)
Admission: RE | Admit: 2015-04-13 | Discharge: 2015-04-13 | Disposition: A | Discharge status: Home / Self Care | Payer: Medicaid Other | Source: Ambulatory Visit | Attending: Family Medicine | Admitting: Family Medicine

## 2015-04-13 VITALS — BP 122/88 | HR 77 | Temp 98.3°F | Ht 61.0 in | Wt 93.4 lb

## 2015-04-13 DIAGNOSIS — R202 Paresthesia of skin: Secondary | ICD-10-CM

## 2015-04-13 DIAGNOSIS — M958 Other specified acquired deformities of musculoskeletal system: Secondary | ICD-10-CM | POA: Diagnosis not present

## 2015-04-13 DIAGNOSIS — M25511 Pain in right shoulder: Secondary | ICD-10-CM

## 2015-04-13 MED ORDER — TRAMADOL HCL 50 MG PO TABS: 50.0000 mg | ORAL_TABLET | Freq: Four times a day (QID) | ORAL | Status: DC | PRN

## 2015-04-13 NOTE — Progress Notes (Signed)
Subjective:    AYSE MCCARTIN - 44 y.o. female MRN [id number]  Date of birth: 10/16/1970  HPI  Annette Chang is here for winging scapula.  Winging scapula/Paresthesia down her right arm:  Scapula is sticking out even worse now.  She couldn't raise her right arm.  Picked up sausage and dropped it.  Has been doing home exercises but seems to have plateaued .  Feels like her right arm is getting weaker.  Hx or right shoulder surgery.   Not doing anything for work but couldn't keep working due to her symptoms.  Having pain that radiates from her right shoulder girdle to her right hand.  Pain is getting worse  Numbness and tingling started a few weeks ago. This is occurring on the medial aspect of her right arm  She is right handed and these symptoms are causing her distress with daily activities.  Having pain while she is sleeping on it.    Health Maintenance:  Health Maintenance Due  Topic Date Due  . HIV Screening  07/28/1984  . MAMMOGRAM  11/13/2014    -  reports that she has been smoking.  She has never used smokeless tobacco. - Review of Systems: Per HPI. - Past Medical History: Patient Active Problem List   Diagnosis Date Noted  . Paresthesia 04/15/2015  . Headache 02/20/2015  . 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 Right shoulder pain 08/22/2014  . DUB (dysfunctional uterine bleeding) 03/11/2014  . Breast cancer screening 10/10/2012  . Screening for cervical cancer 05/03/2012  . H/O rotator cuff surgery 01/02/2012  . 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 H/O: C-section 01/02/2012  . Iron deficiency anemia 01/02/2012  . H/O tubal ligation 01/02/2012  . Depression 01/02/2012   - Medications: reviewed and updated Current Outpatient Prescriptions  Medication Sig Dispense Refill  . Calcium Carbonate-Vitamin D (CALCIUM-VITAMIN D) 500-200 MG-UNIT per tablet Take 1 tablet by mouth daily.    Marland Kitchen ibuprofen (ADVIL,MOTRIN) 600 MG tablet Take 1 tablet  (600 mg total) by mouth every 6 (six) hours as needed. 30 tablet 1  . multivitamin-iron-minerals-folic acid (CENTRUM) chewable tablet Chew 1 tablet by mouth daily.    . naproxen (NAPROSYN) 500 MG tablet Take 1 tablet (500 mg total) by mouth 2 (two) times daily. 30 tablet 0  . naproxen sodium (ANAPROX) 220 MG tablet Take 1 tablet (220 mg total) by mouth 2 (two) times daily as needed. 30 tablet 0  . Omega-3 Fatty Acids (FISH OIL PO) Take 1 capsule by mouth daily.    Marland Kitchen sulfamethoxazole-trimethoprim (BACTRIM DS) 800-160 MG per tablet Take 1 tablet by mouth 2 (two) times daily. (Patient not taking: Reported on 08/02/2014) 14 tablet 0  . traMADol (ULTRAM) 50 MG tablet Take 1 tablet (50 mg total) by mouth every 6 (six) hours as needed. 30 tablet 0  . vitamin C (ASCORBIC ACID) 500 MG tablet Take 500 mg by mouth daily.     No current facility-administered medications for this visit.     Review of Systems See HPI     Objective:   Physical Exam BP 122/88 mmHg  Pulse 77  Temp(Src) 98.3 F (36.8 C) (Oral)  Ht 5\' 1"  (1.549 m)  Wt 93 lb 6.4 oz (42.366 kg)  BMI 17.66 kg/m2 Gen: NAD, alert, cooperative with exam,  Shoulder: Laterality: right Appearance: scapula with significant winging, no deformities observed in right shoulder  Tenderness: no TTP along bony prominences or of the RTC. No TTP  along trapezius  Range of Motion: Active abduction to 100 degrees. Active flexion to 100 degrees. Passive ROM to same degree as active 2/2 to pain. Normal external rotation b/l. Normal internal rotation  Maneuvers: Empty can: weaker compared to left  Internal rotation: neg External rotation:neg O'Brien's test: neg  Speeds: neg  No painful arc and no drop arm sign. Strength:  Bicep: 5/5 Grip: 5/5 Right hand:  Normal pincer grasp  Normal finger adduction strength  Normal thumb up strength to resistance.     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  Reviewed Dr. Luanna Cole notes and he doesn't believe there is any surgical intervention  He advised to f/u with him in 7 weeks after his initial appt in July  - advised to continue to f/u with Dr. Mardelle Matte for continued monitoring.   Paresthesia Unilateral occuring on the medial aspect of the right arm in the C8-T2 range  Nerve testing in hand was normal and no atrophy observed  Unsure if this is related to her winged scapula or not  EMG done previously only showed long thoracic involvement.  - cervical spine x-ray showing some spurring  - Could consider epidural injections but would need MRI prior to. She could have this discussion with Dr. Mardelle Matte at [facility]  - discussed with Dr. Micheline Chapman and will refer to neurology as to assess any further nerve involvement.

## 2015-04-13 NOTE — Patient Instructions (Addendum)
Thank you for coming in,   I would take tylenol for your pain most of the time and only take the tramadol when your pain is severe.   I will call you with the results from today.   Please bring all of your medications with you to each visit.   Sign up for My Chart to have easy access to your labs results, and communication with your Primary care physician   Please feel free to call with any questions or concerns at any time, at [phone]. --Dr. Raeford Razor

## 2015-04-14 ENCOUNTER — Telehealth: Payer: Self-pay | Admitting: Family Medicine

## 2015-04-14 NOTE — Telephone Encounter (Signed)
Left VM for patient. If she calls back please have her speak with a nurse/CMA and inform that her x-rays showed some degenerative changes. I spoke with Dr. Micheline Chapman and if she would like we can send her to a Neurologist for her continued nerve problems or if she would like to pursue epidurals injections into her back to possibly try to alleviate any paresthesia then we would have to get an MRI first.   If any questions then please take the best time and phone number to call and I will try to call her back.   Rosemarie Ax, MD PGY-3, [facility] 04/14/2015, 1:44 PM

## 2015-04-14 NOTE — Telephone Encounter (Signed)
Spoke with patient and informed her of message from PCP. She would like to proceed with neurology referral but would still like to speak with MD.

## 2015-04-15 DIAGNOSIS — R202 Paresthesia of skin: Secondary | ICD-10-CM | POA: Insufficient documentation

## 2015-04-15 NOTE — Assessment & Plan Note (Signed)
Unilateral occuring on the medial aspect of the right arm in the C8-T2 range  Nerve testing in hand was normal and no atrophy observed  Unsure if this is related to her winged scapula or not  EMG done previously only showed long thoracic involvement.  - cervical spine x-ray showing some spurring  - Could consider epidural injections but would need MRI prior to. She could have this discussion with Dr. Mardelle Matte at [facility]  - discussed with Dr. Micheline Chapman and will refer to neurology as to assess any further nerve involvement.

## 2015-04-15 NOTE — Telephone Encounter (Signed)
Pt calling to check on the status of this request as she is awaiting the provider's call. She states that she can be reached all day today at the number I have provided. If the phone call is made tomorrow, there is a possibility that she won't be available but she says he can leave a message. Please contact pt at the earliest convenience. Thank you, Fonda Kinder, ASA

## 2015-04-15 NOTE — Telephone Encounter (Signed)
Spoke with patient about treatment plan.   Rosemarie Ax, MD PGY-3, [facility] 04/15/2015, 9:07 AM

## 2015-04-15 NOTE — Assessment & Plan Note (Signed)
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  Reviewed Dr. Luanna Cole notes and he doesn't believe there is any surgical intervention  He advised to f/u with him in 7 weeks after his initial appt in July  - advised to continue to f/u with Dr. Mardelle Matte for continued monitoring.

## 2015-04-20 ENCOUNTER — Telehealth: Payer: Self-pay | Admitting: Neurology

## 2015-04-20 NOTE — Telephone Encounter (Signed)
I called patient. The patient is still having a lot of shoulder pain. She has a right long thoracic neuropathy. This generally does not get better, but it is not clear why she is still having some much pain. She is going to get her primary care physician to refer her to our office.

## 2015-04-20 NOTE — Telephone Encounter (Signed)
Patient request call back after 2:00pm if calling back tomorrow Tuesday 04/21/15.

## 2015-04-20 NOTE — Telephone Encounter (Signed)
Phone call from patient [phone]. Has only seen for EMG/Nerve Conduction Study. Patient states her Right Shoulder "has been giving her trouble for a minute now"." Bone sticks out in back", "can't hold arm up to cook and stir". Is aware Dr. Jannifer Franklin is not in the office today, will be back in the office tomorrow.

## 2015-05-04 ENCOUNTER — Ambulatory Visit: Payer: Medicaid Other | Admitting: Neurology

## 2015-05-13 ENCOUNTER — Encounter: Payer: Self-pay | Admitting: Neurology

## 2015-05-13 ENCOUNTER — Ambulatory Visit (INDEPENDENT_AMBULATORY_CARE_PROVIDER_SITE_OTHER): Payer: Medicaid Other | Admitting: Neurology

## 2015-05-13 ENCOUNTER — Other Ambulatory Visit: Payer: Self-pay | Admitting: Family Medicine

## 2015-05-13 VITALS — BP 142/95 | HR 63 | Ht 61.0 in | Wt 93.5 lb

## 2015-05-13 DIAGNOSIS — M958 Other specified acquired deformities of musculoskeletal system: Secondary | ICD-10-CM | POA: Diagnosis not present

## 2015-05-13 DIAGNOSIS — M25511 Pain in right shoulder: Secondary | ICD-10-CM

## 2015-05-13 DIAGNOSIS — R202 Paresthesia of skin: Secondary | ICD-10-CM | POA: Diagnosis not present

## 2015-05-13 NOTE — Patient Instructions (Addendum)
   We will check MRI of the neck.   Paresthesia Paresthesia is an abnormal burning or prickling sensation. This sensation is generally felt in the hands, arms, legs, or feet. However, it may occur in any part of the body. Usually, it is not painful. The feeling may be described as:  Tingling or numbness.  Pins and needles.  Skin crawling.  Buzzing.  Limbs falling asleep.  Itching. Most people experience temporary (transient) paresthesia at some time in their lives. Paresthesia may occur when you breathe too quickly (hyperventilation). It can also occur without any apparent cause. Commonly, paresthesia occurs when pressure is placed on a nerve. The sensation quickly goes away after the pressure is removed. For some people, however, paresthesia is a long-lasting (chronic) condition that is caused by an underlying disorder. If you continue to have paresthesia, you may need further medical evaluation. HOME CARE INSTRUCTIONS Watch your condition for any changes. Taking the following actions may help to lessen any discomfort that you are feeling:  Avoid drinking alcohol.  Try acupuncture or massage to help relieve your symptoms.  Keep all follow-up visits as directed by your health care provider. This is important. SEEK MEDICAL CARE IF:  You continue to have episodes of paresthesia.  Your burning or prickling feeling gets worse when you walk.  You have pain, cramps, or dizziness.  You develop a rash. SEEK IMMEDIATE MEDICAL CARE IF:  You feel weak.  You have trouble walking or moving.  You have problems with speech, understanding, or vision.  You feel confused.  You cannot control your bladder or bowel movements.  You have numbness after an injury.  You faint.   This information is not intended to replace advice given to you by your health care provider. Make sure you discuss any questions you have with your health care provider.   Document Released: 07/01/2002 Document  Revised: 11/25/2014 Document Reviewed: 07/07/2014 Elsevier Interactive Patient Education [employer].

## 2015-05-13 NOTE — Telephone Encounter (Signed)
Pt called and would like a refill on her Tramadol left up front for pick up. Please call and let patient when ready to pick up. jw

## 2015-05-13 NOTE — Progress Notes (Signed)
Reason for visit: Right arm pain and paresthesias  Referring physician: Dr. Duffy Bruce is a 44 y.o. female  History of present illness:  Annette Chang is a 44 year old right-handed black female with a history of onset of winging of the right scapular that began in the spring of 2015. The patient has had persistent winging, and EMG and nerve conduction study evaluation done on 11/06/2014 through this office showed some denervation of the serratus anterior muscle consistent with a long thoracic neuropathy. 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 The patient indicates that she has been seen by orthopedic surgery, she was not felt to be a surgical candidate. The patient indicates that she went through physical therapy in the summer 2016, the pain and discomfort in the right arm worsened significantly. The patient has developed onset of paresthesias going down the right arm over the last 2 months, and the paresthesias worsen when she turns her head to the left. She indicates that she has right-sided neck pain down the right shoulder and down the arm. She denies any symptoms on the left arm or in the lower extremities. She denies any change in balance or difficulty controlling the bowels or the bladder. The patient has not been able to retain gainful employment because she is unable to use her right arm. She works in custodial work. She is sent to this office for an evaluation.  Past Medical History  Diagnosis Date  . 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 H/O rotator cuff surgery 01/02/2012  . H/O tubal ligation 01/02/2012  . H/O: C-section 01/02/2012    3   . Headache(784.0) 01/02/2012  . Iron deficiency anemia 01/02/2012  . H/O: C-section 01/02/2012    3   . Depression     Past Surgical History  Procedure  Laterality Date  . Shoulder surgery    . Cesarean section      3 previous c sections  . Rotator cuff surgery    . Tubal ligation    . Novasure ablation N/A 03/11/2014    Procedure: NOVASURE ABLATION;  Surgeon: Emily Filbert, MD;  Location: [facility];  Service: Gynecology;  Laterality: N/A;    Family History  Problem Relation Age of Onset  . Diabetes Maternal Grandmother   . Diabetes Paternal Grandmother   . Diabetes Paternal Grandfather     Social history:  reports that she has been smoking.  She has never used smokeless tobacco. She reports that she drinks about 8.4 oz of alcohol per week. She reports that she does not use illicit drugs.  Medications:  Prior to Admission medications   Medication Sig Start Date End Date Taking? Authorizing Provider  Calcium Carbonate-Vitamin D (CALCIUM-VITAMIN D) 500-200 MG-UNIT per tablet Take 1 tablet by mouth daily.   Yes Historical Provider, MD  ibuprofen (ADVIL,MOTRIN) 600 MG tablet Take 1 tablet (600 mg total) by mouth every 6 (six) hours as needed. 03/11/14  Yes Emily Filbert, MD  multivitamin-iron-minerals-folic acid (CENTRUM) chewable tablet Chew 1 tablet by mouth daily.   Yes Historical Provider, MD  naproxen (NAPROSYN) 500 MG tablet Take 1 tablet (500 mg total) by mouth 2 (two) times daily. 08/27/14  Yes Rosemarie Ax, MD  naproxen sodium (ANAPROX) 220 MG tablet Take 1 tablet (220 mg total) by mouth 2 (two) times  daily as needed. 02/20/15  Yes Archie Patten, MD  Omega-3 Fatty Acids (FISH OIL PO) Take 1 capsule by mouth daily.   Yes Historical Provider, MD  sulfamethoxazole-trimethoprim (BACTRIM DS) 800-160 MG per tablet Take 1 tablet by mouth 2 (two) times daily. 05/05/14  Yes Myra Marijo Sanes, MD  traMADol (ULTRAM) 50 MG tablet Take 1 tablet (50 mg total) by mouth every 6 (six) hours as needed. 04/13/15  Yes Rosemarie Ax, MD  vitamin C (ASCORBIC ACID) 500 MG tablet Take 500 mg by mouth daily.   Yes Historical Provider, MD      Allergies    Allergen Reactions  . Percocet [Oxycodone-Acetaminophen] Itching    ROS:  Out of a complete 14 system review of symptoms, the patient complains only of the following symptoms, and all other reviewed systems are negative.  Blurred vision Muscle cramps, aching muscles Allergies  Blood pressure 142/95, pulse 63, height 5\' 1"  (1.549 m), weight 93 lb 8 oz (42.411 kg).  Physical Exam  General: The patient is alert and cooperative at the time of the examination.  Eyes: Pupils are equal, round, and reactive to light. Discs are flat bilaterally.  Neck: The neck is supple, no carotid bruits are noted.  Respiratory: The respiratory examination is clear.  Cardiovascular: The cardiovascular examination reveals a regular rate and rhythm, no obvious murmurs or rubs are noted.  Neuromuscular: Range of movement of the cervical spine lacks about 15 of lateral rotation to the left, 10 to the right.  Skin: Extremities are without significant edema.  Neurologic Exam  Mental status: The patient is alert and oriented x 3 at the time of the examination. The patient has apparent normal recent and remote memory, with an apparently normal attention span and concentration ability.  Cranial nerves: Facial symmetry is present. There is good sensation of the face to pinprick and soft touch bilaterally. The strength of the facial muscles and the muscles to head turning and shoulder shrug are normal bilaterally. Speech is well enunciated, no aphasia or dysarthria is noted. Extraocular movements are full, but there is exotropia of the left eye with primary gaze. Visual fields are full. The tongue is midline, and the patient has symmetric elevation of the soft palate. No obvious hearing deficits are noted.  Motor: The motor testing reveals 5 over 5 strength of all 4 extremities, with exception that the patient is unable to abduct the right arm past 80. There is a lot of giveaway weakness with motor testing of  the upper arm strength secondary to pain. Good symmetric motor tone is noted throughout.  Sensory: Sensory testing is intact to pinprick, soft touch, vibration sensation, and position sense on all 4 extremities. No evidence of extinction is noted.  Coordination: Cerebellar testing reveals good finger-nose-finger and heel-to-shin bilaterally.  Gait and station: Gait is normal. Tandem gait is normal. Romberg is negative. No drift is seen.  Reflexes: Deep tendon reflexes are symmetric and normal bilaterally. Toes are downgoing bilaterally.   Assessment/Plan:  1. History of right rotator cuff disease  2. Right long thoracic neuropathy  3. New onset right arm pain, paresthesias  The patient reports onset of significant pain, and paresthesias down the right arm. The patient is able to increase the discomfort by turning her head to the left, suggestive of a cervical radiculopathy. The patient was sent for MRI evaluation of the cervical spine, and therapy will be initiated depending upon the results of the above. Otherwise, the patient will follow-up  in 3 months.  Jill Alexanders MD 05/13/2015 11:36 AM  [facility] [address] [place], [place] [address]  Phone [phone] Fax [phone]

## 2015-05-15 ENCOUNTER — Other Ambulatory Visit: Payer: Medicaid Other

## 2015-05-15 MED ORDER — TRAMADOL HCL 50 MG PO TABS: 50.0000 mg | ORAL_TABLET | Freq: Four times a day (QID) | ORAL | Status: DC | PRN

## 2015-05-15 NOTE — Telephone Encounter (Signed)
Refilled tramadol.   Rosemarie Ax, MD PGY-3, [facility] 05/15/2015, 10:19 AM

## 2015-05-18 NOTE — Telephone Encounter (Signed)
Attempted to call twice, busy signal.  Will try again later. Ailine Hefferan, Salome Spotted

## 2015-05-19 NOTE — Telephone Encounter (Signed)
Called number below. Received a message saying the number has been disconnected. Annette Chang, CMA

## 2015-05-20 ENCOUNTER — Inpatient Hospital Stay: Admission: RE | Admit: 2015-05-20 | Payer: Medicaid Other | Source: Ambulatory Visit

## 2015-05-21 NOTE — Telephone Encounter (Signed)
Attempted to call number below, fast busy signal. Annette Chang, [place]

## 2015-05-22 NOTE — Telephone Encounter (Signed)
Attempted to contact pt to inform her of below. Annette Chang, Annette Chang, Annette Chang

## 2015-05-25 ENCOUNTER — Telehealth: Payer: Self-pay | Admitting: Neurology

## 2015-05-25 MED ORDER — METHOCARBAMOL 500 MG PO TABS: 500.0000 mg | ORAL_TABLET | Freq: Three times a day (TID) | ORAL | Status: DC

## 2015-05-25 MED ORDER — PREDNISONE 10 MG PO TABS: ORAL_TABLET | ORAL | Status: DC

## 2015-05-25 NOTE — Telephone Encounter (Signed)
I called the patient. The pain is worsening over time. I will call in prednisone and robaxin, we will reorder the MRI of the cervical spine in early December.

## 2015-05-25 NOTE — Telephone Encounter (Addendum)
Patient is calling and states her Medicaid will not pay for the MRI she has scheduled.  She is wanting to know if we have been able to get authoration for this. Also, patient states she is having a lot of pain in her arm and neck and needs a medication for pain.  Please call.  Thanks! FAITH, THIS MESSAGE WAS SENT TO YOU IN ERROR AND HAS BEEN REROUTED TO KELBY.  THANKS!

## 2015-05-25 NOTE — Telephone Encounter (Signed)
I called and spoke to the patient's daughter. She will have the patient call me back.

## 2015-05-25 NOTE — Telephone Encounter (Signed)
I called the patient. The pain that she is having is the same type of pain as when she was here. She states her right arm/shoulder hurts too bad for her to lift it or use it. She cannot do typical chores around the house. I explained that according to Dr. Jannifer Franklin' note, therapy would be initiated based on MRI results. I advised the patient that I have sent a message to Seth Bake to check on the status of the MRI request. The patient asked if she could have any pain medication in the meantime. I advised that I would ask Dr. Jannifer Franklin.

## 2015-05-25 NOTE — Telephone Encounter (Signed)
Will await callback from patient (several attempts). Fleeger, Salome Spotted

## 2015-05-25 NOTE — Telephone Encounter (Signed)
Patient returned call

## 2015-06-02 NOTE — Telephone Encounter (Signed)
Patient returned call. Please call back to [phone].

## 2015-06-02 NOTE — Telephone Encounter (Signed)
Patient called to advise that since taking prednisone and robaxin she has broken out with red areas like she's been burned, itches, "has me scratching like crazy". Not sure which of the 2 medications is causing it.

## 2015-06-02 NOTE — Telephone Encounter (Signed)
I called patient. The patient has a rash on prednisone and Robaxin. I would stop the Robaxin at this time. If the rash continues, she is to contact our office.

## 2015-06-30 ENCOUNTER — Telehealth: Payer: Self-pay | Admitting: Neurology

## 2015-06-30 DIAGNOSIS — M958 Other specified acquired deformities of musculoskeletal system: Secondary | ICD-10-CM

## 2015-06-30 NOTE — Telephone Encounter (Signed)
I called patient. We will reorder the MRI of the cervical spine at this time. The patient now indicates that she is having difficulty with both sides

## 2015-07-10 ENCOUNTER — Other Ambulatory Visit: Payer: Self-pay | Admitting: Neurology

## 2015-07-14 ENCOUNTER — Other Ambulatory Visit: Payer: Medicaid Other

## 2015-08-06 ENCOUNTER — Ambulatory Visit (INDEPENDENT_AMBULATORY_CARE_PROVIDER_SITE_OTHER): Payer: Medicaid Other | Admitting: Family Medicine

## 2015-08-06 ENCOUNTER — Encounter: Payer: Self-pay | Admitting: Family Medicine

## 2015-08-06 VITALS — BP 116/78 | HR 86 | Temp 98.6°F | Wt 90.2 lb

## 2015-08-06 DIAGNOSIS — L508 Other urticaria: Secondary | ICD-10-CM | POA: Diagnosis not present

## 2015-08-06 DIAGNOSIS — Z23 Encounter for immunization: Secondary | ICD-10-CM

## 2015-08-06 MED ORDER — CETIRIZINE HCL 10 MG PO TABS: 10.0000 mg | ORAL_TABLET | Freq: Every day | ORAL | Status: DC

## 2015-08-06 NOTE — Patient Instructions (Addendum)
I am sorry you are so itchy.  This problem may go on for months.  Keep thinking about what you might be allergic to.   I will send in a prescription for something for the itching that is safe long term. Avoid scratching, hot and cold. Keep your skin moisturized with non scented lotions.  My favorite is Eucerin. Try a baking soda bath.  1/4 box in a bathtub. Lukewarm bath. Pat the skin dry.

## 2015-08-06 NOTE — Progress Notes (Signed)
   Subjective:    Patient ID: Annette Chang, female    DOB: 01-May-1971, 45 y.o.   MRN: [id number]  HPI Patient has had pruritic, evanescent rash (whelps) for two months.  Originally thought to be due to robaxin Rx, which she had started days before.  She has now tried switching detergents and skin moisturizers without success.  Not taking anything right now.  Skin is dry and she scratches a lot.  No fever.  No family members with the rash.    Review of Systems     Objective:   Physical Exam Widespread urticarial rash.        Assessment & Plan:

## 2015-08-06 NOTE — Assessment & Plan Note (Signed)
Fits best with chronic urticaria.  Daily non sedating antihistamine.  Stop scratching.  No harsh soaps or perfumed moisturizers.  Educated.

## 2015-08-07 ENCOUNTER — Ambulatory Visit
Admission: RE | Admit: 2015-08-07 | Discharge: 2015-08-07 | Disposition: A | Discharge status: Home / Self Care | Payer: Medicaid Other | Source: Ambulatory Visit | Attending: Neurology | Admitting: Neurology

## 2015-08-07 DIAGNOSIS — M958 Other specified acquired deformities of musculoskeletal system: Secondary | ICD-10-CM

## 2015-08-09 ENCOUNTER — Telehealth: Payer: Self-pay | Admitting: Neurology

## 2015-08-09 DIAGNOSIS — M25511 Pain in right shoulder: Secondary | ICD-10-CM

## 2015-08-09 NOTE — Telephone Encounter (Signed)
I called the patient. The MRI of the cervical spine is unremarkable, ? Some straightening of the spine C/W spasm. I will get the patient into PT.  MRI cervical 08/09/15:  IMPRESSION: This MRI of the cervical spine shows prominent anterior osteophytesat C4-C5, C5-C6 and C6-C7. Additionally there is disc bulging at C5-C6. There is no nerve root impingement. The spinal cord appears normal.

## 2015-08-13 ENCOUNTER — Ambulatory Visit: Payer: Medicaid Other | Admitting: Adult Health

## 2015-08-14 ENCOUNTER — Encounter: Payer: Self-pay | Admitting: Adult Health

## 2015-08-19 ENCOUNTER — Ambulatory Visit: Payer: Medicaid Other | Attending: Neurology | Admitting: Rehabilitative and Restorative Service Providers"

## 2015-08-19 DIAGNOSIS — R29898 Other symptoms and signs involving the musculoskeletal system: Secondary | ICD-10-CM | POA: Diagnosis not present

## 2015-08-19 DIAGNOSIS — G609 Hereditary and idiopathic neuropathy, unspecified: Secondary | ICD-10-CM | POA: Insufficient documentation

## 2015-08-19 DIAGNOSIS — M25511 Pain in right shoulder: Secondary | ICD-10-CM | POA: Diagnosis present

## 2015-08-19 NOTE — Patient Instructions (Signed)
FLEXION: Supine (Manual Resistance)    Lie on back, right arm at side. Bend elbow to 90 degrees and then lower it down. *Try not to lift shoulder off the mat* Complete _2__ sets of _5__ repetitions. Perform __2_ sessions per day.  Copyright  VHI. All rights reserved.  Pendulum Circular    Bend forward gently at the waist, leaning on table for support. Rock body in a circular pattern to move arm clockwise _5___ times then counterclockwise _5___ times. Do __2__ sessions per day.  Copyright  VHI. All rights reserved.   Extension (Isometric)    Keep elbow at side. Press into arm rest of chair with 50% of your force.  Hold for 5 seconds and then relax.  Repeat 5 times x 2 sets.  Do 2 sessions per day.  Copyright  VHI. All rights reserved.

## 2015-08-20 ENCOUNTER — Ambulatory Visit (INDEPENDENT_AMBULATORY_CARE_PROVIDER_SITE_OTHER): Payer: Medicaid Other | Admitting: Adult Health

## 2015-08-20 ENCOUNTER — Encounter: Payer: Self-pay | Admitting: Adult Health

## 2015-08-20 VITALS — BP 126/82 | HR 60 | Ht 61.0 in | Wt 100.5 lb

## 2015-08-20 DIAGNOSIS — M958 Other specified acquired deformities of musculoskeletal system: Secondary | ICD-10-CM | POA: Diagnosis not present

## 2015-08-20 NOTE — Progress Notes (Signed)
PATIENT: Annette Chang DOB: 22-Mar-1971  REASON FOR VISIT: follow up HISTORY FROM: patient  HISTORY OF PRESENT ILLNESS: Annette Chang is a 45 year old female with a history of winging of the right scapula. She returns today for follow-up. The patient continues to have difficulty lifting the right arm. She states that she is unable to do basic things at home such as comb her hair. She states that she is right handed and therefore can't complete certain tasks. She states that when she is cooking she has to use 2 hands to hold pots. The patient states that she's not been able to work because she is unable to lift objects or use the right arm much. She states that she continues to have discomfort in the right arm. She describes it as an achy pain. Denies any burning or tingling down the arm. She will occasionally have numbness in the forearm. She's had an MRI of the cervical spine that was relatively unremarkable. Nerve conduction studies with EMG was relatively unremarkable with the exception of denervation of the serratus anterior muscle consistent with a long thoracic neuropathy. The patient has completed physical therapy with sports medicine. She started physical therapy with neuro rehabilitation yesterday. She returns today for an evaluation.  HISTORY 05/13/15: Ms. Zusman is a 45 year old right-handed black female with a history of onset of winging of the right scapular that began in the spring of 2015. The patient has had persistent winging, and EMG and nerve conduction study evaluation done on 11/06/2014 through this office showed some denervation of the serratus anterior muscle consistent with a long thoracic neuropathy. 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 The patient indicates that she has been seen by orthopedic surgery, she was not  felt to be a surgical candidate. The patient indicates that she went through physical therapy in the summer 2016, the pain and discomfort in the right arm worsened significantly. The patient has developed onset of paresthesias going down the right arm over the last 2 months, and the paresthesias worsen when she turns her head to the left. She indicates that she has right-sided neck pain down the right shoulder and down the arm. She denies any symptoms on the left arm or in the lower extremities. She denies any change in balance or difficulty controlling the bowels or the bladder. The patient has not been able to retain gainful employment because she is unable to use her right arm. She works in custodial work. She is sent to this office for an evaluation.   REVIEW OF SYSTEMS: Out of a complete 14 system review of symptoms, the patient complains only of the following symptoms, and all other reviewed systems are negative.  Blurred vision, depression, itching  ALLERGIES: Allergies  Allergen Reactions  . Percocet [Oxycodone-Acetaminophen] Itching  . Robaxin [Methocarbamol]     Rash    HOME MEDICATIONS: Outpatient Prescriptions Prior to Visit  Medication Sig Dispense Refill  . Calcium Carbonate-Vitamin D (CALCIUM-VITAMIN D) 500-200 MG-UNIT per tablet Take 1 tablet by mouth daily.    . cetirizine (ZYRTEC) 10 MG tablet Take 1 tablet (10 mg total) by mouth daily. 30 tablet 11  . multivitamin-iron-minerals-folic acid (CENTRUM) chewable tablet Chew 1 tablet by mouth daily.    . Omega-3 Fatty Acids (FISH OIL PO) Take 1 capsule by mouth daily.    . traMADol (  ULTRAM) 50 MG tablet Take 1 tablet (50 mg total) by mouth every 6 (six) hours as needed. 30 tablet 0  . vitamin C (ASCORBIC ACID) 500 MG tablet Take 500 mg by mouth daily.     No facility-administered medications prior to visit.    PAST MEDICAL HISTORY: Past Medical History  Diagnosis Date  . 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  H/O rotator cuff surgery 01/02/2012  . H/O tubal ligation 01/02/2012  . H/O: C-section 01/02/2012    3   . Headache(784.0) 01/02/2012  . Iron deficiency anemia 01/02/2012  . H/O: C-section 01/02/2012    3   . Depression     PAST SURGICAL HISTORY: Past Surgical History  Procedure Laterality Date  . Shoulder surgery    . Cesarean section      3 previous c sections  . Rotator cuff surgery    . Tubal ligation    . Novasure ablation N/A 03/11/2014    Procedure: NOVASURE ABLATION;  Surgeon: Emily Filbert, MD;  Location: [facility];  Service: Gynecology;  Laterality: N/A;    FAMILY HISTORY: Family History  Problem Relation Age of Onset  . Diabetes Maternal Grandmother   . Diabetes Paternal Grandmother   . Diabetes Paternal Grandfather     SOCIAL HISTORY: Social History   Social History  . Marital Status: Single    Spouse Name: N/A  . Number of Children: 2  . Years of Education: 12   Occupational History  . unemployed    Social History Main Topics  . Smoking status: Current Some Day Smoker  . Smokeless tobacco: Never Used  . Alcohol Use: 8.4 oz/week    14 Glasses of wine, 0 Standard drinks or equivalent per week     Comment: weekends  . Drug Use: No  . Sexual Activity: Not Currently    Birth Control/ Protection: Surgical   Other Topics Concern  . Not on file   Social History Narrative   Patient does not drink caffeine.   Patient is right handed.       PHYSICAL EXAM  Filed Vitals:   08/20/15 1452  BP: 126/82  Pulse: 60  Height: 5\' 1"  (1.549 m)  Weight: 100 lb 8 oz (45.587 kg)   Body mass index is 19 kg/(m^2).  Generalized: Well developed, in no acute distress   Neurological examination  Mentation: Alert oriented to time, place, history taking. Follows all commands speech and language fluent Cranial nerve II-XII: Pupils were equal round reactive to light. Extraocular movements were full, visual field were full on confrontational test. Facial sensation and  strength were normal. Uvula tongue midline. Head turning and shoulder shrug  were normal and symmetric. Motor: The motor testing reveals 5 over 5 strength of all 4 extremities. Patient cannot abduct the arm on the right further than 90. She has a hard time with strength testing due to pain. Good symmetric motor tone is noted throughout.  Sensory: Sensory testing is intact to soft touch on all 4 extremities. No evidence of extinction is noted.  Coordination: Cerebellar testing reveals good finger-nose-finger and heel-to-shin bilaterally.  Gait and station: Gait is normal. Tandem gait is normal. Romberg is negative. No drift is seen.  Reflexes: Deep tendon reflexes are symmetric and normal bilaterally.   DIAGNOSTIC DATA (LABS, IMAGING, TESTING) - I reviewed patient records, labs, notes, testing and imaging myself where available.  Lab Results  Component Value Date   WBC 3.1* 02/20/2015   HGB 13.3 02/20/2015  HCT 39.1 02/20/2015   MCV 90.3 02/20/2015   PLT 197 02/20/2015        ASSESSMENT AND PLAN 45 y.o. year old female  has a past medical history of Arthritis of shoulder region, right (01/02/2012); H/O rotator cuff surgery (01/02/2012); H/O tubal ligation (01/02/2012); H/O: C-section (01/02/2012); [id number]) (01/02/2012); Iron deficiency anemia (01/02/2012); H/O: C-section (01/02/2012); and Depression. here with;  1. Winging of the scapula on the right  The patient continues to have pain and limited range of motion in the right arm. She just recently started physical therapy. I have encouraged the patient to continue with physical therapy. she will continue taking Aleve as needed for discomfort. She will keep her follow-up visit in April with Dr. Jannifer Franklin.    Ward Givens, MSN, NP-C 08/20/2015, 3:12 PM [facility] [address], [place], [place] [address] [phone]

## 2015-08-20 NOTE — Therapy (Signed)
[name] [address] [place], [place], [address] Phone: [phone]   Fax:  [phone]  Physical Therapy Evaluation  Patient Details  Name: Annette Chang MRN: [id number] Date of Birth: [birth year] Referring Provider: Lenor Coffin, MD  Encounter Date: 08/19/2015      PT End of Session - 08/20/15 1823    Visit Number 1   Number of Visits 4   Date for PT Re-Evaluation 10/19/15   Authorization Type m-caid (PT to obtain authorization if able for 3 treatment visits/calendar year)   PT Start Time 46   PT Stop Time 1100   PT Time Calculation (min) 40 min   Activity Tolerance Other (comment)  patient emotionally labile and expresses frustration t/o evaluation due to hindered R UE use      Past Medical History  Diagnosis Date  . 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 H/O rotator cuff surgery 01/02/2012  . H/O tubal ligation 01/02/2012  . H/O: C-section 01/02/2012    3   . Headache(784.0) 01/02/2012  . Iron deficiency anemia 01/02/2012  . H/O: C-section 01/02/2012    3   . Depression     Past Surgical History  Procedure Laterality Date  . Shoulder surgery    . Cesarean section      3 previous c sections  . Rotator cuff surgery    . Tubal ligation    . Novasure ablation N/A 03/11/2014    Procedure: NOVASURE ABLATION;  Surgeon: Emily Filbert, MD;  Location: [facility];  Service: Gynecology;  Laterality: N/A;    There were no vitals filed for this visit.  Visit Diagnosis:  Weakness of right arm  Hereditary and idiopathic peripheral neuropathy  Pain in joint of right shoulder          [facility] PT Assessment - 08/20/15 1742    Assessment   Medical Diagnosis R shoulder and neck pain;R long thoracic neuropathy   Referring Provider Lenor Coffin, MD   Onset Date/Surgical Date 08/09/15   Hand Dominance Right   Prior Therapy summer 2016- without relief   Precautions   Precaution  Comments Poor biomechanics - monitor movement   [place] residence   Living Arrangements Spouse/significant other;Children   Additional Comments Patient uses R arm for cooking tasks like stirring and props her R arm on R knee in order to brush her hair. She uses R arm for stabilizing during ADLs.  She folds clothes and stirls cooking with R arm and demonstrates with poor mechanics using momentum to swing her arm at times.   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  The spine of the scapula is palpated in typical location of upper trap.  She has made postural compensations for R shoulder weakness and uses momentum to swing her arm into positions at times.   ROM / Strength   AROM /  PROM / Strength AROM;PROM;Strength   AROM   Overall AROM  Deficits   Overall AROM Comments Patient can isolate muscle contraction for A/ROM wrist and elbow. She cannot isolate shoulder A/ROM and uses dysfunctional biomechanics that may be leading to worsening muscle guarding.   PROM   Overall PROM  Deficits   Overall PROM Comments Patient painful with right shoulder abduction to 90 degrees with tightness noted proximally at pec major insertion.     Strength   Overall Strength Deficits   Overall Strength Comments R hand patient can open/close into fist, she has at least 3/5 strength in R wrist for flexion/extension and elbow  flexion as she can move against gravity.  She cannot isolate pure shoulder muscle activation and uses her trunk rotation to initiate movement and uses active assist from her L UE.  In sidelying, she cannot depress or adduct her R scapula.  She has severe pain with palpation of parascapular muscles.                    [facility] Adult PT Treatment/Exercise - 08/20/15 1742    Exercises   Exercises Other Exercises   Other Exercises  PT observed R UE in functional activities.  She uses L arm to place R arm for laundry tasks, but shakes out clothes (which puts stress on R shoulder), She uses R arm for stirring tasks and reports she is so fatigued afterwards that she cannot eat for hours.  PT demonstrated strengthening elbow muscles in shoulder supported position.  Demonstrated pendulum (gentle) to allow shoulder girdle muscles to stretch using gravity to relieve tightness.  Also demonstrated elbow flexion strengthening and isometric tricep strengthening.                PT Education - 08/20/15 1821    Education provided Yes   Education Details HEP: isometric triceps, supine elbow flexion isolated, pendulum;  education on using kitchen table for folding laundry to minimize injury to right shoulder.   Person(s) Educated Patient   Methods Explanation;Demonstration;Handout   Comprehension Verbalized understanding;Returned demonstration          PT Short Term Goals - 08/20/15 1824    PT SHORT TERM GOAL #1   Title STGs=LTGs= distributed over 8 weeks due to financial limitations for visits.           PT Long Term Goals - 08/20/15 1825    PT LONG TERM GOAL #1   Title The patient will return demo HEP for R wrist, elbow strengthening, and shoulder stretching.  Target date : 10/19/2015   Baseline No current home program   Time 8   Period Weeks   PT LONG TERM GOAL #2   Title The patient will have 4/5 R elbow flexion for improved use of R UE to stabilize objects during ADLs.    Baseline 3/5 R elbow strength   Time 8   Period Weeks   PT LONG TERM GOAL #3   Title The patient will report pain in R shoulder < or equal to 5/10.   Baseline Pain level severe 10/10- patient emotionally labile due to severe pain each day   Time 8   Period Weeks   PT LONG TERM GOAL #4   Title The patient will return demonstrate proper mechanics during ADLs of cooking and folding laundry to reduce poor mechanical use of shoulder during function.   Baseline Patient has poor mechanics and is swinging her R arm into positions, which may be  causing further shoulder pain/damage.   Time 8   Period Weeks               Plan - 08/20/15 1832    Clinical Impression Statement The patient is a 45 year old female presenting with idiopathic peripheral neuropathy (G60.9).  Today's evaluation emphasized functional use of R UE with home program instruction and ADL modification discussion.  The patient will benefit from PT services due to significant R UE weakness, pain and immobility.  PT treatment for R arm weakness (R29.898) and pain in R shoulder joint (M25.511).     Pt will benefit from skilled therapeutic intervention in order to improve on the following deficits Decreased mobility;Decreased strength;Postural dysfunction;Improper body mechanics;Impaired flexibility;Decreased range of motion;Impaired tone;Pain   Rehab Potential Fair   Clinical Impairments Affecting Rehab Potential chronic nature of long nerve neuropathy   PT Frequency 1x / week   PT Duration 4 weeks  distributed over 8 week timeframe   PT Treatment/Interventions ADLs/Self Care Home Management;Neuromuscular re-education;Therapeutic exercise;Manual techniques;Therapeutic activities;Passive range of motion   PT Next Visit Plan Check HEP, educate on ADL modifications to protect R shoulder, add HEP for neck stretching, strengthening supporting structures   Consulted and Agree with Plan of Care Patient         Problem  List Patient Active Problem List   Diagnosis Date Noted  . Chronic urticaria 08/06/2015  . Paresthesia 04/15/2015  . Headache 02/20/2015  . 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 Right shoulder pain 08/22/2014  . DUB (dysfunctional uterine bleeding) 03/11/2014  . Breast cancer screening 10/10/2012  . Screening for cervical cancer 05/03/2012  . H/O rotator cuff surgery 01/02/2012  . 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 H/O: C-section 01/02/2012  . Iron deficiency anemia 01/02/2012  . H/O tubal ligation 01/02/2012  . Depression 01/02/2012    [place], PT 08/20/2015, 6:39 PM  [facility] [address] [place], [place], [address] Phone: [phone]   Fax:  [phone]  Name: RAHINI STIFTER MRN: [id number] Date of Birth: 1971-05-23

## 2015-08-20 NOTE — Patient Instructions (Signed)
Continue Rehab Keep follow-up in April

## 2015-08-20 NOTE — Progress Notes (Signed)
I have read the note, and I agree with the clinical assessment and plan.  Annette Chang   

## 2015-08-31 ENCOUNTER — Ambulatory Visit: Payer: Medicaid Other | Attending: Neurology | Admitting: Rehabilitative and Restorative Service Providers"

## 2015-08-31 DIAGNOSIS — M25511 Pain in right shoulder: Secondary | ICD-10-CM | POA: Diagnosis present

## 2015-08-31 DIAGNOSIS — R29898 Other symptoms and signs involving the musculoskeletal system: Secondary | ICD-10-CM | POA: Insufficient documentation

## 2015-08-31 DIAGNOSIS — G609 Hereditary and idiopathic neuropathy, unspecified: Secondary | ICD-10-CM

## 2015-08-31 NOTE — Therapy (Signed)
[facility] [address] [place], [place], [address] Phone: [phone]   Fax:  [phone]  Physical Therapy Treatment  Patient Details  Name: Annette Chang MRN: [id number] Date of Birth: [birth year] Referring Provider: Lenor Coffin, MD  Encounter Date: 08/31/2015      PT End of Session - 08/31/15 1025    Visit Number 2   Number of Visits 4   Date for PT Re-Evaluation 10/19/15   Authorization Type m-caid (PT to obtain authorization if able for 3 treatment visits/calendar year)   Authorization Time Period authorization ends 09/17/2015   PT Start Time 0804   PT Stop Time 0846   PT Time Calculation (min) 42 min   Activity Tolerance Patient tolerated treatment well   Behavior During Therapy [facility] for tasks assessed/performed      Past Medical History  Diagnosis Date  . 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 H/O rotator cuff surgery 01/02/2012  . H/O tubal ligation 01/02/2012  . H/O: C-section 01/02/2012    3   . Headache(784.0) 01/02/2012  . Iron deficiency anemia 01/02/2012  . H/O: C-section 01/02/2012    3   . Depression     Past Surgical History  Procedure Laterality Date  . Shoulder surgery    . Cesarean section      3 previous c sections  . Rotator cuff surgery    . Tubal ligation    . Novasure ablation N/A 03/11/2014    Procedure: NOVASURE ABLATION;  Surgeon: Emily Filbert, MD;  Location: [facility];  Service: Gynecology;  Laterality: N/A;    There were no vitals filed for this visit.  Visit Diagnosis:  Pain in joint of right shoulder  Weakness of right arm  Hereditary and idiopathic peripheral neuropathy      Subjective Assessment - 08/31/15 0804    Subjective The patient reports pendulum exercise is going well and helps relieve tightness.  The patient reports she took pain meds prior to therapy.   Patient Stated Goals Reduce pain.   Currently in Pain? Yes   Pain Score 7    Pain  Location Neck   Pain Orientation Right   Pain Descriptors / Indicators Aching   Pain Type Chronic pain            [facility] Adult PT Treatment/Exercise - 08/31/15 1029    Self-Care   Self-Care Other Self-Care Comments   Other Self-Care Comments  Patient demonstrated stirring a pot on the stove and PT made recommendations on keeping elbow at neutral vs abducted during stirring.  Patient and PT discussed folding laundry and patient was able to demonstrate folding technique with shoulder at neutral and elbow flexed.     Exercises   Exercises Shoulder;Elbow   Elbow Exercises   Elbow Flexion AROM;Right;Left;10 reps  seated with elbow supported   Theraband Level (Elbow Extension) Level 1 (Yellow)  Standing with band anchored above door with cues on techniqu   Shoulder Exercises: Seated   External Rotation AROM;Strengthening;Right;5 reps  With elbow supported on arm rest (using towel)   Shoulder Exercises: ROM/Strengthening   Other ROM/Strengthening Exercises Standing IR/ER with cues on keeping elbow against side x 5 reps   Shoulder Exercises: Isometric Strengthening   ADduction --  standing squeezing towel roll into side with elbow flexed      also emphasized performing exercises on both sides to strengthen the left UE in order to have support from left side during ADLs.  PT Education - 08/31/15 1023    Education provided Yes   Education Details Added further HEP: kept pendulum, isometric triceps, elbow flexion, shoulder external rotation, tricep press with theraband.   Educated on minimizing excessive shoulder swinging and discussed ways to modify ADLs.   Person(s) Educated Patient   Methods Explanation;Demonstration;Handout   Comprehension Verbalized understanding;Returned demonstration          PT Short Term Goals - 08/20/15 1824    PT SHORT TERM GOAL #1   Title STGs=LTGs= distributed over 8 weeks due to financial limitations for visits.           PT Long Term  Goals - 08/20/15 1825    PT LONG TERM GOAL #1   Title The patient will return demo HEP for R wrist, elbow strengthening, and shoulder stretching.  Target date : 10/19/2015   Baseline No current home program   Time 8   Period Weeks   PT LONG TERM GOAL #2   Title The patient will have 4/5 R elbow flexion for improved use of R UE to stabilize objects during ADLs.   Baseline 3/5 R elbow strength   Time 8   Period Weeks   PT LONG TERM GOAL #3   Title The patient will report pain in R shoulder < or equal to 5/10.   Baseline Pain level severe 10/10- patient emotionally labile due to severe pain each day   Time 8   Period Weeks   PT LONG TERM GOAL #4   Title The patient will return demonstrate proper mechanics during ADLs of cooking and folding laundry to reduce poor mechanical use of shoulder during function.   Baseline Patient has poor mechanics and is swinging her R arm into positions, which may be causing further shoulder pain/damage.   Time 8   Period Weeks               Plan - 08/31/15 1026    Clinical Impression Statement The patient returns today reporting improved pain as compared to initial evaluation.  Our session today focused on activity and behavioral modification to reduce using momentumm to accomplish movements and working during ADLs with elbow/shoulder at neutral vs abducted.  PT provided HEP update.   PT Next Visit Plan Check HEP/ answer questions, add HEP for neck stretching, strengthening supporting structures   Consulted and Agree with Plan of Care Patient        Problem List Patient Active Problem List   Diagnosis Date Noted  . Chronic urticaria 08/06/2015  . Paresthesia 04/15/2015  . Headache 02/20/2015  . 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 Right shoulder pain 08/22/2014  . DUB (dysfunctional uterine bleeding) 03/11/2014  . Breast cancer screening 10/10/2012  . Screening for cervical cancer 05/03/2012  . H/O rotator cuff surgery 01/02/2012  .  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 H/O: C-section 01/02/2012  . Iron deficiency anemia 01/02/2012  . H/O tubal ligation 01/02/2012  . Depression 01/02/2012    Zaydee Aina, PT 08/31/2015, 10:36 AM  [facility] [address] [place], [place], [address] Phone: [phone]   Fax:  [phone]  Name: Annette Chang MRN: [id number] Date of Birth: 05/02/1971

## 2015-08-31 NOTE — Patient Instructions (Signed)
Pendulum Circular    Bend forward gently at the waist, leaning on table for support. Rock body in a circular pattern to move arm clockwise _5___ times then counterclockwise _5___ times. Do __2__ sessions per day.  Copyright  VHI. All rights reserved.   Extension (Isometric)    Keep elbow at side. Press into arm rest of chair with 50% of your force. Hold for 5 seconds and then relax. Repeat 5 times x 2 sets. Do 2 sessions per day.  Copyright  VHI. All rights reserved.   External Rotation (Resistive Band)    NO BAND!  Only perform this motion. Elbow bent at right angle, and held firmly against side. Pull right arm outward.  Repeat _5-10___ times. Do _2___ sessions per day.  Copyright  VHI. All rights reserved.   AROM: Elbow Flexion / Extension    With right hand palm up, gently bend elbow as far as possible. Then straighten arm as far as possible. Repeat __5-10__ times per set. Do _1___ sets per session. Do _1-2___ sessions per day. BOTH SIDES FOR STRENGTHENING* Copyright  VHI. All rights reserved.   (Home) External Rotation: With Abduction - Sitting    *No weight or pulley* Prop R elbow on arm of the couch and rotate shoulder within a tolerable range of motion. Repeat __5-10__ times per set.  Do __5__ sessions per week.  Copyright  VHI. All rights reserved.  Extension (Resistive Band)    ANCHOR BAND AT TOP OF DOOR use elbow movements only. Straighten elbow, pushing down. Hold __2-3__ seconds. Repeat _5-10___ times. Do __1__ sessions per day.  Copyright  VHI. All rights reserved.   TIPS FOR DAY TO DAY ACTIVITIES: 1) Try to keep your elbow at your side when using it for kitchen activities or laundry.   2) Avoid swinging your arm/shoulder to accomplish movement.  It is much better to be controlled through motion. 3) During exercise, listen to your body for pain.  If it hurts, wait and try again. 4) Work on strengthening both sides during  exercise.  [place], PT1/26/2017 6:43 PM [facility] [address] [place], [place], [address] Phone: [phone] Fax: [phone]

## 2015-09-02 ENCOUNTER — Ambulatory Visit: Payer: Medicaid Other | Admitting: Family Medicine

## 2015-09-10 ENCOUNTER — Ambulatory Visit: Payer: Medicaid Other | Admitting: Family Medicine

## 2015-09-10 ENCOUNTER — Ambulatory Visit: Payer: Medicaid Other | Admitting: Rehabilitative and Restorative Service Providers"

## 2015-09-17 ENCOUNTER — Ambulatory Visit: Payer: Medicaid Other | Admitting: Rehabilitative and Restorative Service Providers"

## 2015-09-17 ENCOUNTER — Encounter: Payer: Self-pay | Admitting: Rehabilitative and Restorative Service Providers"

## 2015-09-17 DIAGNOSIS — R29898 Other symptoms and signs involving the musculoskeletal system: Secondary | ICD-10-CM

## 2015-09-17 DIAGNOSIS — M25511 Pain in right shoulder: Secondary | ICD-10-CM

## 2015-09-17 NOTE — Therapy (Signed)
[facility] [address] [place], [place], [address] Phone: [phone]   Fax:  [phone]  Physical Therapy Treatment  Patient Details  Name: Annette Chang MRN: [id number] Date of Birth: 11-Jul-1971 Referring Provider: Lenor Coffin, MD  Encounter Date: 09/17/2015      PT End of Session - 09/17/15 0906    Visit Number 3   Number of Visits 4   Date for PT Re-Evaluation 10/19/15   Authorization Type m-caid (PT to obtain authorization if able for 3 treatment visits/calendar year)   Authorization Time Period authorization ends 09/17/2015   PT Start Time 0915   PT Stop Time 0955   PT Time Calculation (min) 40 min   Activity Tolerance Patient tolerated treatment well   Behavior During Therapy [facility] for tasks assessed/performed      Past Medical History  Diagnosis Date  . 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 H/O rotator cuff surgery 01/02/2012  . H/O tubal ligation 01/02/2012  . H/O: C-section 01/02/2012    3   . Headache(784.0) 01/02/2012  . Iron deficiency anemia 01/02/2012  . H/O: C-section 01/02/2012    3   . Depression     Past Surgical History  Procedure Laterality Date  . Shoulder surgery    . Cesarean section      3 previous c sections  . Rotator cuff surgery    . Tubal ligation    . Novasure ablation N/A 03/11/2014    Procedure: NOVASURE ABLATION;  Surgeon: Emily Filbert, MD;  Location: [facility];  Service: Gynecology;  Laterality: N/A;    There were no vitals filed for this visit.  Visit Diagnosis:  Weakness of right arm  Pain in joint of right shoulder      Subjective Assessment - 09/17/15 0937    Subjective The patient reports she is doing exercises in the home.  She initially reports that her pain is the same, but later states that she has noted a decrease in pain with performing the prescribed exercises.  She also notes feeling that she is getting stronger in some musculature.   Patient Stated Goals Reduce pain.   Currently in Pain? Yes   Pain Score 7    Pain Location Neck   Pain Orientation Right   Pain Descriptors / Indicators Aching   Pain Type Chronic pain   Pain Onset More than a month ago   Pain Frequency Constant   Aggravating Factors  fatigue, poor use with ADLs   Pain Relieving Factors stretching, rest      THERAPEUTIC EXERCISE: Supine therapeutic exercise emphasizing stabilizing the R scapula using mat + supine for greater proprioception during exercises working on elbow flexion, shoulder IR/ER with minimal manual resistance, tricep press supine, scapular depression supine. Attempted sidelying scapular mobility, which was increased pain and was stopped. Modified current HEP based on patient feedback:  tricep pull down more tolerable in supine where scapula is stabilized against mat surface, performed supine ER (elbow flexed at 90 and shoulder in neutral position).   SELF CARE/HOME MANAGEMENT: PT and patient discussed sleeping positions for comfort, supporting UE during sleeping, positioning for comfort, and use of extremity in ADLs that would not increase pain.        PT Education - 09/17/15 0958    Education provided Yes   Education Details Reviewed HEP, modified to do more in supine for scapular stabilization.   Person(s) Educated Patient   Methods Explanation;Demonstration;Handout   Comprehension  Verbalized understanding;Returned demonstration          PT Short Term Goals - 08/20/15 1824    PT SHORT TERM GOAL #1   Title STGs=LTGs= distributed over 8 weeks due to financial limitations for visits.           PT Long Term Goals - 09/17/15 0959    PT LONG TERM GOAL #1   Title The patient will return demo HEP for R wrist, elbow strengthening, and shoulder stretching.  Target date : 10/19/2015   Baseline Updated HEP.   Time 8   Period Weeks   Status Achieved   PT LONG TERM GOAL #2   Title The patient will have 4/5 R elbow flexion for  improved use of R UE to stabilize objects during ADLs.   Baseline Met with improved biceps strength.   Time 8   Period Weeks   Status Achieved   PT LONG TERM GOAL #3   Title The patient will report pain in R shoulder < or equal to 5/10.   Baseline Varies and creates increased stress related to pain.   Time 8   Period Weeks   Status Not Met   PT LONG TERM GOAL #4   Title The patient will return demonstrate proper mechanics during ADLs of cooking and folding laundry to reduce poor mechanical use of shoulder during function.   Baseline Patient has poor mechanics and is swinging her R arm into positions, which may be causing further shoulder pain/damage.   Time 8   Period Weeks   Status Achieved               Plan - 09/17/15 1000    Clinical Impression Statement The patient met 3/4 LTGs.  She has insurance limitations with m-caid for attending PT and was approved from 2/3-2/23/17 for 3 visits in PT.  She is demonstrating improvement in self management of condition at home with PT's main focus to be on decreasing maladaptive strategies of slinging R UE during ADLs, home strengthening program with scapular stabilized (in supine or sitting with emphasis on depression), and stretching/pendulum to manage tightness.  The patient was provided educational resources/contact information for [place] [facility] at [facility] for continued PT (they offer free clinic Tuesday evenings).  Patient provided verbal consent for PT to contact [facility] clinic supervisor to discuss beneficial interventions for this patient if she chooses to pursue this route.     PT Next Visit Plan Discharge visit today.   Consulted and Agree with Plan of Care Patient        Problem List Patient Active Problem List   Diagnosis Date Noted  . Chronic urticaria 08/06/2015  . Paresthesia 04/15/2015  . Headache 02/20/2015  . 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 Right shoulder pain 08/22/2014  . DUB (dysfunctional uterine  bleeding) 03/11/2014  . Breast cancer screening 10/10/2012  . Screening for cervical cancer 05/03/2012  . H/O rotator cuff surgery 01/02/2012  . 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 H/O: C-section 01/02/2012  . Iron deficiency anemia 01/02/2012  . H/O tubal ligation 01/02/2012  . Depression 01/02/2012    Annette Chang, PT 09/17/2015, 2:19 PM  [facility] [address] [place], [place], [address] Phone: [phone]   Fax:  [phone]  Name: Annette Chang MRN: [id number] Date of Birth: [birth year]

## 2015-09-17 NOTE — Therapy (Signed)
[facility] [address] [place], [place], [address] Phone: [phone]   Fax:  [phone]  Patient Details  Name: Annette Chang MRN: [id number] Date of Birth: Dec 28, 1970 Referring Provider:  No ref. provider found  Encounter Date: 09/17/2015  PHYSICAL THERAPY DISCHARGE SUMMARY  Visits from Start of Care: 3  Current functional level related to goals / functional outcomes:     PT Long Term Goals - 09/17/15 0959    PT LONG TERM GOAL #1   Title The patient will return demo HEP for R wrist, elbow strengthening, and shoulder stretching.  Target date : 10/19/2015   Baseline Updated HEP.   Time 8   Period Weeks   Status Achieved   PT LONG TERM GOAL #2   Title The patient will have 4/5 R elbow flexion for improved use of R UE to stabilize objects during ADLs.   Baseline Met with improved biceps strength.   Time 8   Period Weeks   Status Achieved   PT LONG TERM GOAL #3   Title The patient will report pain in R shoulder < or equal to 5/10.   Baseline Varies and creates increased stress related to pain.   Time 8   Period Weeks   Status Not Met   PT LONG TERM GOAL #4   Title The patient will return demonstrate proper mechanics during ADLs of cooking and folding laundry to reduce poor mechanical use of shoulder during function.   Baseline Patient has poor mechanics and is swinging her R arm into positions, which may be causing further shoulder pain/damage.   Time 8   Period Weeks   Status Achieved        Remaining deficits: Chronic R UE deficits with pain   Education / Equipment: Home program, improved mechanical use of R shoulder during ADLs and functional tasks, resources for community PT clinic at [facility].   Plan: Patient agrees to discharge.  Patient goals were partially met. Patient is being discharged due to financial reasons.  ?????         Lerry Cordrey, PT 09/17/2015, 2:36 PM  [facility] [address] [place], [place], [address] Phone: [phone]   Fax:  [phone]

## 2015-09-17 NOTE — Patient Instructions (Signed)
Depression: ROM (Supine / Side-Lying)    Position (A) Helper: Support left arm. Place hand on top edge of shoulder blade. Motion (B) - Glide shoulder blade toward feet. Repeat __10_ times. Repeat with other arm. Do _2__ sessions per day. Copyright  VHI. All rights reserved.  External Rotation: Isometric (Supine / Sitting)    Position Patient: Keep left elbow at side, bent to 90. Helper: Stabilize elbow. Place hand on outside of forearm. Motion -Cue patient to push hand out to side, wrist straight. -Helper blocks movement at forearm. -Elbow remains in place. Hold _5__ seconds. Relax. Repeat __10_ times. Repeat with other arm. Do _2__ sessions per day.  Copyright  VHI. All rights reserved.  FLEXION: Supine (Manual Resistance)    Lie on back, right arm at side. Bend elbow to 90 degrees and then lower it down. *Try not to lift shoulder off the mat* Complete _2__ sets of _5__ repetitions. Perform __2_ sessions per day.  Copyright  VHI. All rights reserved.  Pendulum Circular    Bend forward gently at the waist, leaning on table for support. Rock body in a circular pattern to move arm clockwise _5___ times then counterclockwise _5___ times. Do __2__ sessions per day.  Copyright  VHI. All rights reserved.   Extension (Isometric)    Do this lying down *with helper to place their hand under your elbow. Keep elbow at side. Press into arm rest of chair with 50% of your force. Hold for 5 seconds and then relax. Repeat 5 times x 2 sets. Do 2 sessions per day.  Copyright  VHI. All rights reserved.     Bend forward gently at the waist, leaning on table for support. Rock body in a circular pattern to move arm clockwise _5___ times then counterclockwise _5___ times. Do __2__ sessions per day.  Copyright  VHI. All rights reserved.    External Rotation (Resistive Band)    NO BAND! Only perform this motion.  LYING DOWN ON YOUR BACK. Elbow bent at right angle, and  held firmly against side. Pull right arm outward.  Repeat _5-10___ times. Do _2___ sessions per day.  Copyright  VHI. All rights reserved.   AROM: Elbow Flexion / Extension    With right hand palm up, gently bend elbow as far as possible. Then straighten arm as far as possible.  DO THIS LYING DOWN. Repeat __5-10__ times per set. Do _1___ sets per session. Do _1-2___ sessions per day. BOTH SIDES FOR STRENGTHENING* Copyright  VHI. All rights reserved.   Extension (Resistive Band)    DO THIS LYING DOWN. If it still hurts, stop doing. use elbow movements only. Straighten elbow, pushing down. Hold __2-3__ seconds. Repeat _5-10___ times. Do __1__ sessions per day.  Copyright  VHI. All rights reserved.   TIPS FOR DAY TO DAY ACTIVITIES: 1) Try to keep your elbow at your side when using it for kitchen activities or laundry.  2) Avoid swinging your arm/shoulder to accomplish movement. It is much better to be controlled through motion. 3) During exercise, listen to your body for pain. If it hurts, wait and try again. 4) Work on strengthening both sides during exercise.   Hasty, PT1/26/2017 6:43 PM [facility] [address] [place], [place], [address] Phone: [phone] Fax: [phone]

## 2015-11-12 ENCOUNTER — Encounter: Payer: Self-pay | Admitting: Neurology

## 2015-11-12 ENCOUNTER — Ambulatory Visit (INDEPENDENT_AMBULATORY_CARE_PROVIDER_SITE_OTHER): Payer: Medicaid Other | Admitting: Neurology

## 2015-11-12 VITALS — BP 105/70 | HR 60 | Ht 61.0 in | Wt 98.0 lb

## 2015-11-12 DIAGNOSIS — M958 Other specified acquired deformities of musculoskeletal system: Secondary | ICD-10-CM | POA: Diagnosis not present

## 2015-11-12 DIAGNOSIS — M5414 Radiculopathy, thoracic region: Secondary | ICD-10-CM | POA: Diagnosis not present

## 2015-11-12 DIAGNOSIS — G589 Mononeuropathy, unspecified: Secondary | ICD-10-CM

## 2015-11-12 HISTORY — DX: Mononeuropathy, unspecified: G58.9

## 2015-11-12 MED ORDER — GABAPENTIN 100 MG PO CAPS: ORAL_CAPSULE | ORAL | Status: DC

## 2015-11-12 NOTE — Progress Notes (Signed)
Reason for visit: Right long thoracic neuropathy  Annette Chang is an 45 y.o. 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 The patient continues to have ongoing discomfort on the shoulders, right greater than left. The patient has a rotator cuff tear on the right, she was not felt to be a surgical candidate. She has discomfort when she tries to lift the arm up, she has neuromuscular discomfort in the right shoulder and crossing over into the left shoulder. MRI of the cervical spine did not show evidence of nerve root or spinal cord compression. The patient has not regained the ability to get the right arm past 90 of abduction. She has difficulty performing day-to-day tasks with the arm because of pain with use of the arm. She has undergone some physical therapy that has been transiently beneficial, she is trying to get therapy set up for a location closer to her home in [place], [place]. The patient has intermittent tingling of the right hand when the spasm in the shoulder on the right gets bad. The patient has developed a chronic rash, etiology is unclear, initially was thought secondary to the Robaxin. She is on Zyrtec on a daily basis for this.  Past Medical History  Diagnosis Date  . 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 H/O rotator cuff surgery 01/02/2012  . H/O tubal ligation 01/02/2012  . H/O: C-section 01/02/2012    3   . Headache(784.0) 01/02/2012  . Iron deficiency anemia 01/02/2012  . H/O: C-section 01/02/2012    3   . Depression   . 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    Right    Past Surgical History  Procedure Laterality Date  . Shoulder surgery    . Cesarean section      3 previous c sections  . Rotator cuff surgery    . Tubal ligation    . Novasure  ablation N/A 03/11/2014    Procedure: NOVASURE ABLATION;  Surgeon: Emily Filbert, MD;  Location: [facility];  Service: Gynecology;  Laterality: N/A;    Family History  Problem Relation Age of Onset  . Diabetes Maternal Grandmother   . Diabetes Paternal Grandmother   . Diabetes Paternal Grandfather     Social history:  reports that she has quit smoking. She has never used smokeless tobacco. She reports that she drinks about 8.4 oz of alcohol per week. She reports that she does not use illicit drugs.    Allergies  Allergen Reactions  . Percocet [Oxycodone-Acetaminophen] Itching  . Robaxin [Methocarbamol]     Rash    Medications:  Prior to Admission medications   Medication Sig Start Date End Date Taking? Authorizing Provider  Calcium Carbonate-Vitamin D (CALCIUM-VITAMIN D) 500-200 MG-UNIT per tablet Take 1 tablet by mouth daily.   Yes Historical Provider, MD  cetirizine (ZYRTEC) 10 MG tablet Take 1 tablet (10 mg total) by mouth daily. 08/06/15  Yes Zenia Resides, MD  multivitamin-iron-minerals-folic acid (CENTRUM) chewable tablet Chew 1 tablet by mouth daily.   Yes Historical Provider, MD  Omega-3 Fatty Acids (FISH OIL PO) Take 1 capsule by mouth daily.   Yes Historical Provider, MD  traMADol (ULTRAM) 50 MG tablet Take 1 tablet (50 mg total) by mouth every 6 (six) hours as needed. 05/15/15  Yes Ysidro Evert  Hinton Dyer, MD  vitamin C (ASCORBIC ACID) 500 MG tablet Take 500 mg by mouth daily.   Yes Historical Provider, MD    ROS:  Out of a complete 14 system review of symptoms, the patient complains only of the following symptoms, and all other reviewed systems are negative.  Achy muscles, neck pain, neck stiffness Depression  Blood pressure 105/70, pulse 60, height 5\' 1"  (1.549 m), weight 98 lb (44.453 kg).  Physical Exam  General: The patient is alert and cooperative at the time of the examination. The patient is quite thin.  Neuromuscular: Range of mood the cervical spine is  full.  Skin: No significant peripheral edema is noted.   Neurologic Exam  Mental status: The patient is alert and oriented x 3 at the time of the examination. The patient has apparent normal recent and remote memory, with an apparently normal attention span and concentration ability.   Cranial nerves: Facial symmetry is present. Speech is normal, no aphasia or dysarthria is noted. Extraocular movements are full. Visual fields are full. The patient has good strength with head turning from one side to the next, and with shoulder shrug bilaterally.  Motor: The patient has good strength in all 4 extremities, with exception that the patient cannot abduct the right arm past 90. With passive movement, she has discomfort when elevating arm past 90.  Sensory examination: Soft touch sensation is symmetric on the face, arms, and legs.  Coordination: The patient has good finger-nose-finger and heel-to-shin bilaterally.  Gait and station: The patient has a normal gait. Tandem gait is normal. Romberg is negative. No drift is seen.  Reflexes: Deep tendon reflexes are symmetric.   Assessment/Plan:  1. Right long thoracic neuropathy  2. Right rotator cuff tear  The patient is having a lot of neuromuscular discomfort in part associated with the long thoracic neuropathy, and with a rotator cuff tear. The patient has not regained good function of the right arm. She will continue physical therapy, I will add gabapentin taking 100 mg 3 times daily for 2 weeks, then 200 mg 3 times daily. She will follow-up in 4 months, sooner if needed.  Jill Alexanders MD 11/12/2015 4:55 PM  [facility] [address] [place], [place] [address]  Phone [phone] Fax [phone]

## 2016-01-01 ENCOUNTER — Ambulatory Visit: Payer: Medicaid Other | Admitting: Family Medicine

## 2016-01-18 ENCOUNTER — Ambulatory Visit (INDEPENDENT_AMBULATORY_CARE_PROVIDER_SITE_OTHER): Payer: Medicaid Other | Admitting: Family Medicine

## 2016-01-18 ENCOUNTER — Encounter: Payer: Self-pay | Admitting: Family Medicine

## 2016-01-18 VITALS — BP 107/86 | HR 84 | Temp 98.4°F | Ht 61.0 in | Wt 93.4 lb

## 2016-01-18 DIAGNOSIS — N644 Mastodynia: Secondary | ICD-10-CM | POA: Diagnosis present

## 2016-01-18 NOTE — Assessment & Plan Note (Signed)
Possible to be related to cyclic breast pain.  Is s/p ablation and it seems to correlate to her cycle.  Recent mammogram was normal.  She does have a winged scapula on the right but this doesn't appear to be MSK in nature based on exam.  - advised tight fitting brassiere - ibuprofen for pain PRN

## 2016-01-18 NOTE — Patient Instructions (Signed)
Thank you for coming in,   You can take ibuprofen for pain or wear a tight fitting bra.    Please bring all of your medications with you to each visit.   Health maintenance items that are due.  Health Maintenance  Topic Date Due  . HIV Screening  07/28/1984  . Flu Shot  02/23/2016  . Pap Smear  12/12/2016  . Mammogram  08/24/2017  . Tetanus Vaccine  01/01/2022     Sign up for My Chart to have easy access to your labs results, and communication with your Primary care physician   Please feel free to call with any questions or concerns at any time, at [phone]. --Dr. Raeford Razor

## 2016-01-18 NOTE — Progress Notes (Signed)
   Subjective:    Annette Chang - 45 y.o. female MRN [id number]  Date of birth: 20-Jun-1971  CC breast pain   HPI  Annette Chang is here for breast pain.  Breast pain:  Having pain when her breasts are touched.  Intermittent in nature.  Mammogram has shown no breast cancer performed in January  She doesn't have a cycle anymore.  Occurs more around the end of the month when her cycle may occur.  She takes pain medication from her neurologist for her winged scapula.  There isn't discharge.    PMH: Winged scapula, iron deficiency anemia,  SH: no alcohol or tobacco  FH: DM2,   Health Maintenance:  Health Maintenance Due  Topic Date Due  . HIV Screening  07/28/1984    Review of Systems See HPI     Objective:   Physical Exam BP 107/86 mmHg  Pulse 84  Temp(Src) 98.4 F (36.9 C) (Oral)  Ht 5\' 1"  (1.549 m)  Wt 93 lb 6.4 oz (42.366 kg)  BMI 17.66 kg/m2 Gen: NAD, alert, cooperative with exam,  Breast: normal inspection bilaterally, no nipple discharge with expression bilaterally, mildly tender to palpation in all quadrants bilaterally, no LAD, no pain with chest wall palpation  Skin: no rashes, normal turgor   Psych: alert and oriented    Assessment & Plan:   Breast pain Possible to be related to cyclic breast pain.  Is s/p ablation and it seems to correlate to her cycle.  Recent mammogram was normal.  She does have a winged scapula on the right but this doesn't appear to be MSK in nature based on exam.  - advised tight fitting brassiere - ibuprofen for pain PRN

## 2016-02-09 ENCOUNTER — Emergency Department (HOSPITAL_COMMUNITY)
Admission: EM | Admit: 2016-02-09 | Discharge: 2016-02-09 | Disposition: A | Discharge status: Home / Self Care | Payer: Medicaid Other | Attending: Emergency Medicine | Admitting: Emergency Medicine

## 2016-02-09 ENCOUNTER — Encounter (HOSPITAL_COMMUNITY): Payer: Self-pay | Admitting: Emergency Medicine

## 2016-02-09 DIAGNOSIS — R519 Headache, unspecified: Secondary | ICD-10-CM

## 2016-02-09 DIAGNOSIS — Z87891 Personal history of nicotine dependence: Secondary | ICD-10-CM | POA: Diagnosis not present

## 2016-02-09 DIAGNOSIS — Z79899 Other long term (current) drug therapy: Secondary | ICD-10-CM | POA: Insufficient documentation

## 2016-02-09 DIAGNOSIS — R51 Headache: Secondary | ICD-10-CM | POA: Diagnosis present

## 2016-02-09 LAB — I-STAT CHEM 8, ED
BUN: 7 mg/dL (ref 6–20)
CALCIUM ION: 1.13 mmol/L (ref 1.13–1.30)
CREATININE: 0.6 mg/dL (ref 0.44–1.00)
Chloride: 105 mmol/L (ref 101–111)
Glucose, Bld: 83 mg/dL (ref 65–99)
HEMATOCRIT: 39 % (ref 36.0–46.0)
HEMOGLOBIN: 13.3 g/dL (ref 12.0–15.0)
Potassium: 3.8 mmol/L (ref 3.5–5.1)
SODIUM: 138 mmol/L (ref 135–145)
TCO2: 22 mmol/L (ref 0–100)

## 2016-02-09 MED ORDER — NAPROXEN 500 MG PO TABS: ORAL_TABLET | ORAL | Status: DC

## 2016-02-09 MED ORDER — KETOROLAC TROMETHAMINE 60 MG/2ML IM SOLN
60.0000 mg | Freq: Once | INTRAMUSCULAR | Status: AC
  Administered 2016-02-09: 60 mg via INTRAMUSCULAR
  Filled 2016-02-09: qty 2

## 2016-02-09 NOTE — Discharge Instructions (Signed)
Annette Chang,  Your exam was reassuring. It is also reassuring that pain improved with medications. Non-steroidal anti-inflammatory medications (like naproxen that I have prescribed) can cause rebound headaches if taken regularly. Please take naproxen only as needed for headache.   General Headache Without Cause A headache is pain or discomfort felt around the head or neck area. There are many causes and types of headaches. In some cases, the cause may not be found.  HOME CARE  Managing Pain  Take over-the-counter and prescription medicines only as told by your doctor.  Lie down in a dark, quiet room when you have a headache.  If directed, apply ice to the head and neck area:  Put ice in a plastic bag.  Place a towel between your skin and the bag.  Leave the ice on for 20 minutes, 2-3 times per day.  Use a heating pad or hot shower to apply heat to the head and neck area as told by your doctor.  Keep lights dim if bright lights bother you or make your headaches worse. Eating and Drinking  Eat meals on a regular schedule.  Lessen how much alcohol you drink.  Lessen how much caffeine you drink, or stop drinking caffeine. General Instructions  Keep all follow-up visits as told by your doctor. This is important.  Keep a journal to find out if certain things bring on headaches. For example, write down:  What you eat and drink.  How much sleep you get.  Any change to your diet or medicines.  Relax by getting a massage or doing other relaxing activities.  Lessen stress.  Sit up straight. Do not tighten (tense) your muscles.  Do not use tobacco products. This includes cigarettes, chewing tobacco, or e-cigarettes. If you need help quitting, ask your doctor.  Exercise regularly as told by your doctor.  Get enough sleep. This often means 7-9 hours of sleep. GET HELP IF:  Your symptoms are not helped by medicine.  You have a headache that feels different than the other  headaches.  You feel sick to your stomach (nauseous) or you throw up (vomit).  You have a fever. GET HELP RIGHT AWAY IF:   Your headache becomes really bad.  You keep throwing up.  You have a stiff neck.  You have trouble seeing.  You have trouble speaking.  You have pain in the eye or ear.  Your muscles are weak or you lose muscle control.  You lose your balance or have trouble walking.  You feel like you will pass out (faint) or you pass out.  You have confusion.   This information is not intended to replace advice given to you by your health care provider. Make sure you discuss any questions you have with your health care provider.   Document Released: 04/19/2008 Document Revised: 04/01/2015 Document Reviewed: 11/03/2014 Elsevier Interactive Patient Education [employer].

## 2016-02-09 NOTE — ED Notes (Signed)
Pt sts hit head on dash several days ago; pt sts HA and htn noted; pt sts taking meds

## 2016-02-09 NOTE — ED Provider Notes (Signed)
CSN: [id number]     Arrival date & time 02/09/16  [id number] History   First MD Initiated Contact with Patient 02/09/16 1013     Chief Complaint  Patient presents with  . Headache   (Consider location/radiation/quality/duration/timing/severity/associated sxs/prior Treatment) HPI   Annette Chang is a 45-y/o female who presents with headache x 3 days. It started Saturday night and has been fairly constant since then. She says she hit her head on a car dashboard Saturday when a friend started the car accidentally before she was all the way in. It started in the front of her head but moved to "all over." She took ibuprofen last night and pain improved but was still present when she woke up this morning. She notes R maxillary facial tenderness and occasional R blurry vision. Of note, she usually wears glasses but has not been able to afford to get a new pair.   Past Medical History  Diagnosis Date  . 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 H/O rotator cuff surgery 01/02/2012  . H/O tubal ligation 01/02/2012  . H/O: C-section 01/02/2012    3   . Headache(784.0) 01/02/2012  . Iron deficiency anemia 01/02/2012  . H/O: C-section 01/02/2012    3   . Depression   . 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 Shoulder surgery    . Cesarean section      3 previous c sections  . Rotator cuff surgery    . Tubal ligation    . Novasure ablation N/A 03/11/2014    Procedure: NOVASURE ABLATION;  Surgeon: Emily Filbert, MD;  Location: [facility];  Service: Gynecology;  Laterality: N/A;   Family History  Problem Relation Age of Onset  . Diabetes Maternal Grandmother   . Diabetes Paternal Grandmother   . Diabetes Paternal Grandfather    Social History  Substance Use Topics  . Smoking status: Former Research scientist (life sciences)  . Smokeless tobacco: Never Used  . Alcohol Use: 8.4 oz/week    14 Glasses of wine, 0 Standard drinks or equivalent per week     Comment:  weekends   OB History    Gravida Para Term Preterm AB TAB SAB Ectopic Multiple Living   3 3 3       2      Review of Systems  Constitutional: Negative for fever and chills.  HENT: Negative for congestion.   Eyes: Negative for pain.  Respiratory: Negative for cough.   Gastrointestinal: Negative for nausea and vomiting.  Musculoskeletal: Negative for neck pain.  Skin: Negative for rash and wound.  Neurological: Positive for headaches. Negative for speech difficulty and weakness.    Allergies  Percocet and Robaxin  Home Medications   Prior to Admission medications   Medication Sig Start Date End Date Taking? Authorizing Provider  acetaminophen (TYLENOL) 325 MG tablet Take 650 mg by mouth every 6 (six) hours as needed for moderate pain or headache.   Yes Historical Provider, MD  cetirizine (ZYRTEC) 10 MG tablet Take 1 tablet (10 mg total) by mouth daily. 08/06/15  Yes Zenia Resides, MD  gabapentin (NEURONTIN) 100 MG capsule One capsule three times a day for 2 weeks, then take 2 capsules three times a day Patient taking differently: Take 200 mg by mouth 3 (three) times daily. One capsule three times a day for 2 weeks, then take 2 capsules three times a day 11/12/15  Yes Kathrynn Ducking,  MD  naproxen sodium (ANAPROX) 220 MG tablet Take 220 mg by mouth 2 (two) times daily with a meal.   Yes Historical Provider, MD  naproxen (NAPROSYN) 500 MG tablet Take 1 tablet with meal up to 2 times daily as needed for headache. 02/09/16   Rogue Bussing, MD   BP 128/88 mmHg  Pulse 66  Temp(Src) 98.5 F (36.9 C) (Oral)  Resp 18  SpO2 100% Physical Exam  Constitutional: She is oriented to person, place, and time. She appears well-developed and well-nourished.  HENT:  Head: Normocephalic and atraumatic.  Nose: Nose normal.  Mouth/Throat: Oropharynx is clear and moist.  No maxillary or frontal sinus tenderness to percussion.  Eyes: Conjunctivae and EOM are normal. Pupils are equal,  round, and reactive to light.  Neck: Normal range of motion. Neck supple.  Cardiovascular: Normal rate, regular rhythm and normal heart sounds.   No murmur heard. Pulmonary/Chest: Effort normal and breath sounds normal.  Abdominal: Soft. Bowel sounds are normal. She exhibits no distension. There is no tenderness. There is no rebound and no guarding.  Musculoskeletal: Normal range of motion.  Tenderness to palpation over left AC joint. Winging of right scapula (chronic).   Neurological: She is alert and oriented to person, place, and time. No cranial nerve deficit. Coordination normal.  Visual acuity 20/25 on L and 20/30 on R.  Skin: Skin is warm and dry.  Nursing note and vitals reviewed.   ED Course  Procedures (including critical care time) Labs Review Labs Reviewed  I-STAT CHEM 8, ED   Imaging Review No results found. I have personally reviewed and evaluated these images and lab results as part of my medical decision-making.   EKG Interpretation None      MDM   Final diagnoses:  Acute nonintractable headache, unspecified headache type   Pain improved with IM toradol. Patient was prescribed naproxen to use as needed and advised to follow-up with PCP if symptoms continued. Information on NSAID rebound headaches given.  Olene Floss, MD [facility], PGY-2    [facility], MD 02/10/16 2135  Elnora Morrison, MD 02/12/16 2351

## 2016-02-09 NOTE — ED Notes (Signed)
MD at bedside. 

## 2016-03-15 ENCOUNTER — Ambulatory Visit: Payer: Medicaid Other | Admitting: Neurology

## 2016-03-24 ENCOUNTER — Telehealth: Payer: Self-pay | Admitting: Neurology

## 2016-03-24 ENCOUNTER — Telehealth: Payer: Self-pay | Admitting: *Deleted

## 2016-03-24 ENCOUNTER — Ambulatory Visit: Payer: Medicaid Other | Admitting: Neurology

## 2016-03-24 NOTE — Telephone Encounter (Signed)
This patient has no showed 3 times within the last 12 months, one on 05/04/2015, 19th of January 2017, and on 03/24/2016.  If you go by office policy, this patient should be discharged from the office.

## 2016-03-24 NOTE — Telephone Encounter (Signed)
No showed follow up appointment. 

## 2016-03-24 NOTE — Telephone Encounter (Signed)
This patient did not show for a revisit appointment today. 

## 2016-03-24 NOTE — Telephone Encounter (Signed)
Pt called at 9:12 today to advise she just woke up and missed her appt this morning at 7:30. I advised her this was the 3rd no show or c/a within 24hrs and the RN would need to call her back to let her know if the appt can be r/s. The pt understood

## 2016-03-25 ENCOUNTER — Encounter: Payer: Self-pay | Admitting: Neurology

## 2016-03-29 ENCOUNTER — Inpatient Hospital Stay (HOSPITAL_COMMUNITY)
Admission: AD | Admit: 2016-03-29 | Discharge: 2016-04-01 | DRG: 885 | Disposition: A | Discharge status: Home / Self Care | Payer: Medicaid Other | Source: Intra-hospital | Attending: Psychiatry | Admitting: Psychiatry

## 2016-03-29 ENCOUNTER — Emergency Department (HOSPITAL_COMMUNITY)
Admission: EM | Admit: 2016-03-29 | Discharge: 2016-03-29 | Disposition: A | Discharge status: Other Acute Inpatient Hospital | Payer: Medicaid Other | Attending: Emergency Medicine | Admitting: Emergency Medicine

## 2016-03-29 ENCOUNTER — Encounter (HOSPITAL_COMMUNITY): Payer: Self-pay | Admitting: Emergency Medicine

## 2016-03-29 ENCOUNTER — Encounter (HOSPITAL_COMMUNITY): Payer: Self-pay | Admitting: *Deleted

## 2016-03-29 DIAGNOSIS — Z87891 Personal history of nicotine dependence: Secondary | ICD-10-CM | POA: Diagnosis not present

## 2016-03-29 DIAGNOSIS — G4700 Insomnia, unspecified: Secondary | ICD-10-CM | POA: Diagnosis present

## 2016-03-29 DIAGNOSIS — F329 Major depressive disorder, single episode, unspecified: Secondary | ICD-10-CM | POA: Diagnosis not present

## 2016-03-29 DIAGNOSIS — F4323 Adjustment disorder with mixed anxiety and depressed mood: Secondary | ICD-10-CM | POA: Diagnosis not present

## 2016-03-29 DIAGNOSIS — R45851 Suicidal ideations: Secondary | ICD-10-CM

## 2016-03-29 DIAGNOSIS — Z79899 Other long term (current) drug therapy: Secondary | ICD-10-CM | POA: Diagnosis not present

## 2016-03-29 DIAGNOSIS — F339 Major depressive disorder, recurrent, unspecified: Principal | ICD-10-CM | POA: Diagnosis present

## 2016-03-29 DIAGNOSIS — F32A Depression, unspecified: Secondary | ICD-10-CM

## 2016-03-29 DIAGNOSIS — R51 Headache: Secondary | ICD-10-CM

## 2016-03-29 DIAGNOSIS — F419 Anxiety disorder, unspecified: Secondary | ICD-10-CM

## 2016-03-29 DIAGNOSIS — R519 Headache, unspecified: Secondary | ICD-10-CM

## 2016-03-29 DIAGNOSIS — F332 Major depressive disorder, recurrent severe without psychotic features: Secondary | ICD-10-CM | POA: Diagnosis not present

## 2016-03-29 DIAGNOSIS — F418 Other specified anxiety disorders: Secondary | ICD-10-CM | POA: Insufficient documentation

## 2016-03-29 LAB — COMPREHENSIVE METABOLIC PANEL
ALBUMIN: 4.1 g/dL (ref 3.5–5.0)
ALK PHOS: 48 U/L (ref 38–126)
ALT: 18 U/L (ref 14–54)
ANION GAP: 7 (ref 5–15)
AST: 25 U/L (ref 15–41)
BILIRUBIN TOTAL: 0.5 mg/dL (ref 0.3–1.2)
BUN: <5 mg/dL — ABNORMAL LOW (ref 6–20)
CALCIUM: 8.9 mg/dL (ref 8.9–10.3)
CO2: 23 mmol/L (ref 22–32)
Chloride: 107 mmol/L (ref 101–111)
Creatinine, Ser: 0.62 mg/dL (ref 0.44–1.00)
GLUCOSE: 90 mg/dL (ref 65–99)
POTASSIUM: 3.5 mmol/L (ref 3.5–5.1)
Sodium: 137 mmol/L (ref 135–145)
TOTAL PROTEIN: 7.1 g/dL (ref 6.5–8.1)

## 2016-03-29 LAB — ETHANOL

## 2016-03-29 LAB — CBC
HEMATOCRIT: 39.1 % (ref 36.0–46.0)
Hemoglobin: 13.1 g/dL (ref 12.0–15.0)
MCH: 30.3 pg (ref 26.0–34.0)
MCHC: 33.5 g/dL (ref 30.0–36.0)
MCV: 90.5 fL (ref 78.0–100.0)
PLATELETS: 158 10*3/uL (ref 150–400)
RBC: 4.32 MIL/uL (ref 3.87–5.11)
RDW: 12.7 % (ref 11.5–15.5)
WBC: 4 10*3/uL (ref 4.0–10.5)

## 2016-03-29 LAB — SALICYLATE LEVEL

## 2016-03-29 LAB — RAPID URINE DRUG SCREEN, HOSP PERFORMED
Amphetamines: NOT DETECTED
BARBITURATES: NOT DETECTED
BENZODIAZEPINES: NOT DETECTED
COCAINE: POSITIVE — AB
OPIATES: NOT DETECTED
Tetrahydrocannabinol: POSITIVE — AB

## 2016-03-29 LAB — ACETAMINOPHEN LEVEL

## 2016-03-29 MED ORDER — LORAZEPAM 1 MG PO TABS
1.0000 mg | ORAL_TABLET | Freq: Once | ORAL | Status: AC
  Administered 2016-03-29: 1 mg via ORAL
  Filled 2016-03-29: qty 1

## 2016-03-29 MED ORDER — MAGNESIUM HYDROXIDE 400 MG/5ML PO SUSP: 30.0000 mL | Freq: Every day | ORAL | Status: DC | PRN

## 2016-03-29 MED ORDER — ALUM & MAG HYDROXIDE-SIMETH 200-200-20 MG/5ML PO SUSP: 30.0000 mL | ORAL | Status: DC | PRN

## 2016-03-29 MED ORDER — HYDROXYZINE HCL 25 MG PO TABS
25.0000 mg | ORAL_TABLET | Freq: Four times a day (QID) | ORAL | Status: DC | PRN
  Administered 2016-03-29: 25 mg via ORAL
  Filled 2016-03-29: qty 1

## 2016-03-29 MED ORDER — TRAZODONE HCL 50 MG PO TABS: 50.0000 mg | ORAL_TABLET | Freq: Every evening | ORAL | Status: DC | PRN

## 2016-03-29 MED ORDER — KETOROLAC TROMETHAMINE 30 MG/ML IJ SOLN: 30.0000 mg | Freq: Once | INTRAMUSCULAR | Status: DC

## 2016-03-29 MED ORDER — KETOROLAC TROMETHAMINE 60 MG/2ML IM SOLN
30.0000 mg | Freq: Once | INTRAMUSCULAR | Status: AC
  Administered 2016-03-29: 30 mg via INTRAMUSCULAR
  Filled 2016-03-29: qty 2

## 2016-03-29 NOTE — ED Notes (Signed)
[facility] Called for Transport.

## 2016-03-29 NOTE — ED Provider Notes (Signed)
[facility] DEPT Provider Note   CSN: [id number] Arrival date & time: 03/29/16  [id number]     History   Chief Complaint Chief Complaint  Patient presents with  . Hypertension  . Headache  . Facial Pain  . Medical Clearance    HPI   Annette Chang is a 45 y.o. female hx of depression presenting with headache. Per patient she has been under a lot of stress lately and has recently been having a lot of headaches. States she has had a headache which started last night around 9 pm, indicates headache is bilateral frontal headache. She took Ibuprofen and vinegar and then fell asleep. Patient then woke up this morning at 5 am with the same headache again.States headache is severe( 9/10).Denies this being a thunderclap headache/worse headache of her life. Endorses Photophobia. Denies phonophobia. Patient with previous hx of elevated blood pressure and headache. No hx of migraines. No inciting event. No vision changes. No hx of trauma.  No birth control changes, recent tubal ligation. Indicates having a couple of glasses of wine last night. Denies any other drug use.  Patient indicates being very depressed lately from all the life stressors, specifically financial. She endorse passive suicidal ideations, denies any active suicidal ideation. She denies having a plan to hurt herself. She denies taking any medications to try to hurt herself.       Past Medical History:  Diagnosis Date  . 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 Depression   . H/O rotator cuff surgery 01/02/2012  . H/O tubal ligation 01/02/2012  . H/O: C-section 01/02/2012   3   . H/O: C-section 01/02/2012   3   . Headache(784.0) 01/02/2012  . Iron deficiency anemia 01/02/2012  . 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 Breast pain 01/18/2016  . 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 Chronic urticaria 08/06/2015  . Paresthesia 04/15/2015  . Headache  02/20/2015  . 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 Right shoulder pain 08/22/2014  . DUB (dysfunctional uterine bleeding) 03/11/2014  . Breast cancer screening 10/10/2012  . Screening for cervical cancer 05/03/2012  . H/O rotator cuff surgery 01/02/2012  . 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 H/O: C-section 01/02/2012  . Iron deficiency anemia 01/02/2012  . H/O tubal ligation 01/02/2012  . Depression 01/02/2012    Past Surgical History:  Procedure Laterality Date  . CESAREAN SECTION     3 previous c sections  . NOVASURE ABLATION N/A 03/11/2014   Procedure: NOVASURE ABLATION;  Surgeon: Emily Filbert, MD;  Location: [facility];  Service: Gynecology;  Laterality: N/A;  . rotator cuff surgery    . SHOULDER SURGERY    . TUBAL LIGATION      OB History    Gravida Para Term Preterm AB Living   3 3 3     2    SAB TAB Ectopic Multiple Live Births                   Home Medications    Prior to Admission medications   Medication Sig Start Date End Date Taking? Authorizing Provider  acetaminophen (TYLENOL) 325 MG tablet Take 650 mg by mouth every 6 (six) hours as needed for moderate pain or headache.   Yes Historical Provider, MD  cetirizine (ZYRTEC) 10 MG tablet Take 1 tablet (10 mg total) by mouth daily. 08/06/15  Yes Gwyndolyn Saxon  A Hensel, MD  gabapentin (NEURONTIN) 100 MG capsule One capsule three times a day for 2 weeks, then take 2 capsules three times a day Patient taking differently: Take 200 mg by mouth 3 (three) times daily. One capsule three times a day for 2 weeks, then take 2 capsules three times a day 11/12/15  Yes Kathrynn Ducking, MD  naproxen (NAPROSYN) 500 MG tablet Take 1 tablet with meal up to 2 times daily as needed for headache. Patient taking differently: Take 500 mg by mouth 2 (two) times daily with a meal. Take 1 tablet with meal up to 2 times daily as needed for headache. 02/09/16  Yes Hillary Corinda Gubler, MD    Family History Family History    Problem Relation Age of Onset  . Diabetes Maternal Grandmother   . Diabetes Paternal Grandmother   . Diabetes Paternal Grandfather     Social History Social History  Substance Use Topics  . Smoking status: Former Research scientist (life sciences)  . Smokeless tobacco: Never Used  . Alcohol use 8.4 oz/week    14 Glasses of wine per week     Comment: weekends     Allergies   Percocet [oxycodone-acetaminophen] and Robaxin [methocarbamol]   Review of Systems Review of Systems  HENT: Negative for congestion and sinus pressure.   Eyes: Negative for visual disturbance.  Respiratory: Negative for shortness of breath.   Cardiovascular: Negative for chest pain and palpitations.  Gastrointestinal: Negative for nausea and vomiting.  Musculoskeletal: Negative for neck pain and neck stiffness.  Neurological: Positive for headaches. Negative for weakness and numbness.  Psychiatric/Behavioral: Positive for agitation and dysphoric mood.     Physical Exam Updated Vital Signs BP 121/92 (BP Location: Right Arm)   Pulse 74   Temp 98.1 F (36.7 C) (Oral)   Resp 16   SpO2 100%   Physical Exam   ED Treatments / Results  Labs (all labs ordered are listed, but only abnormal results are displayed) Labs Reviewed  COMPREHENSIVE METABOLIC PANEL - Abnormal; Notable for the following:       Result Value   BUN <5 (*)    All other components within normal limits  ACETAMINOPHEN LEVEL - Abnormal; Notable for the following:    Acetaminophen (Tylenol), Serum <10 (*)    All other components within normal limits  URINE RAPID DRUG SCREEN, HOSP PERFORMED - Abnormal; Notable for the following:    Cocaine POSITIVE (*)    Tetrahydrocannabinol POSITIVE (*)    All other components within normal limits  ETHANOL  SALICYLATE LEVEL  CBC    EKG  EKG Interpretation None       Radiology No results found.  Procedures Procedures (including critical care time)  Medications Ordered in ED Medications  ketorolac  (TORADOL) injection 30 mg (30 mg Intramuscular Given 03/29/16 0925)  LORazepam (ATIVAN) tablet 1 mg (1 mg Oral Given 03/29/16 0925)    Initial Impression / Assessment and Plan / ED Course  I have reviewed the triage vital signs and the nursing notes.  Pertinent labs & imaging results that were available during my care of the patient were reviewed by me and considered in my medical decision making (see chart for details). Presenting with tension headache of less than 24 hour duration. No red flags for headache. Provide Toradol for headache.  Patient positive for cocaine, likely possibly source of headache.Patient indicates severe depression, anxiety,  and passive suicidal ideation. Behavioral health consulted. Ativan for anxiety. Behavioral medicine indicated inpatient psychiatry. Patient was  transferred to POD C.   Clinical Course     Final Clinical Impressions(s) / ED Diagnoses   Final diagnoses:  None    New Prescriptions New Prescriptions   No medications on file     Verla Bryngelson Cletis Media, MD 03/29/16 Fellows, MD 03/29/16 [phone]

## 2016-03-29 NOTE — ED Notes (Signed)
Pt changed to purple scrubs. Room secured. Wires removed. Pt belongings bagged and labeled. This tech explained to Pt hospital procedure regarding SI.

## 2016-03-29 NOTE — ED Triage Notes (Signed)
Pt from home with c/o high blood pressure last night after neighbor took it and told her it was "high."  Pt states she's had a headache since last night as well.  Pt in NAD, A&O.

## 2016-03-29 NOTE — ED Notes (Signed)
MD at bedside. 

## 2016-03-29 NOTE — ED Notes (Signed)
Attempted Report BH.

## 2016-03-29 NOTE — BH Assessment (Addendum)
Tele Assessment Note   Annette Chang is a 45 y.o. female who presents voluntarily to [facility] for SI w/ no plan. Pt indicates that she's been having SI "for a minute". Pt states that she has been "stressed to the max" and doesn't feel safe at home alone with these suicidal thoughts. Pt shares that she's been having visions of herself in a casket. Pt identifies many triggers leading to her stress and subsequent SI, including her and SO being laid off, not having money to pay the bills and not being able to get assistance. Pt reports minimal sleep and admits to overdosing on her rx meds (given after rotator cuff surgery) to go to sleep. Pt indicates supportive family, but expresses discomfort in seeking help from them as to not be a burden to them. Pt reports AH of a "really low voice" telling her "don't go". Pt reports experiencing these AH @ 3xs/month. Pt did not appear to be responding to any internal stimuli during assessment. Pt was not confident that she could contract for safety outside of the hospital. Pt denies ever having an IP mental health hospitalization.  Diagnosis: MDD, single episode, severe, with psychotic features  Past Medical History:  Past Medical History:  Diagnosis Date  . 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 Depression   . H/O rotator cuff surgery 01/02/2012  . H/O tubal ligation 01/02/2012  . H/O: C-section 01/02/2012   3   . H/O: C-section 01/02/2012   3   . Headache(784.0) 01/02/2012  . Iron deficiency anemia 01/02/2012  . 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 CESAREAN SECTION     3 previous c sections  . NOVASURE ABLATION N/A 03/11/2014   Procedure: NOVASURE ABLATION;  Surgeon: Emily Filbert, MD;  Location: [facility];  Service: Gynecology;  Laterality: N/A;  . rotator cuff surgery    . SHOULDER SURGERY    . TUBAL LIGATION      Family History:  Family History  Problem Relation Age of Onset  .  Diabetes Maternal Grandmother   . Diabetes Paternal Grandmother   . Diabetes Paternal Grandfather     Social History:  reports that she has quit smoking. She has never used smokeless tobacco. She reports that she drinks about 8.4 oz of alcohol per week . She reports that she does not use drugs.  Additional Social History:  Alcohol / Drug Use Pain Medications: see PTA meds Prescriptions: see PTA meds Over the Counter: see PTA meds History of alcohol / drug use?: Yes (pt admits to ongoing use of marijuana only, but also tested positive for cocaine.) Substance #1 Name of Substance 1: Marijuana 1 - Age of First Use: been using "forever" 1 - Frequency: 2-3xs/week 1 - Duration: ongoing  CIWA: CIWA-Ar BP: 121/92 Pulse Rate: 74 COWS:    PATIENT STRENGTHS: (choose at least two) Average or above average intelligence Capable of independent living Motivation for treatment/growth Supportive family/friends  Allergies:  Allergies  Allergen Reactions  . Percocet [Oxycodone-Acetaminophen] Itching  . Robaxin [Methocarbamol]     Rash    Home Medications:  (Not in a hospital admission)  OB/GYN Status:  No LMP recorded. Patient has had an ablation.  General Assessment Data Location of Assessment: [facility] ED TTS Assessment: In system Is this a Tele or Face-to-Face Assessment?: Tele Assessment Is this an Initial Assessment or a Re-assessment for this encounter?: Initial Assessment Marital status: Single  Is patient pregnant?: No Pregnancy Status: No Living Arrangements: Spouse/significant other, Children Can pt return to current living arrangement?: Yes Admission Status: Voluntary Is patient capable of signing voluntary admission?: Yes Referral Source: Self/Family/Friend Insurance type: Medicaid     Crisis Care Plan Living Arrangements: Spouse/significant other, Children Name of Psychiatrist: none Name of Therapist: none  Education Status Is patient currently in school?:  No  Risk to self with the past 6 months Suicidal Ideation: Yes-Currently Present Has patient been a risk to self within the past 6 months prior to admission? : No Suicidal Intent: No Has patient had any suicidal intent within the past 6 months prior to admission? : No Is patient at risk for suicide?: Yes Suicidal Plan?: No Has patient had any suicidal plan within the past 6 months prior to admission? : No Access to Means: No What has been your use of drugs/alcohol within the last 12 months?: see above Previous Attempts/Gestures: Yes How many times?: 1 Triggers for Past Attempts: Unknown Intentional Self Injurious Behavior: None Family Suicide History: No Recent stressful life event(s): Financial Problems, Job Loss Persecutory voices/beliefs?: No Depression: Yes Depression Symptoms: Insomnia, Fatigue, Feeling worthless/self pity Substance abuse history and/or treatment for substance abuse?: No Suicide prevention information given to non-admitted patients: Not applicable  Risk to Others within the past 6 months Homicidal Ideation: No Does patient have any lifetime risk of violence toward others beyond the six months prior to admission? : No Thoughts of Harm to Others: No Current Homicidal Intent: No Current Homicidal Plan: No Access to Homicidal Means: No History of harm to others?: No Assessment of Violence: None Noted Does patient have access to weapons?: No Criminal Charges Pending?: No Does patient have a court date: No Is patient on probation?: No  Psychosis Hallucinations: Auditory Delusions: None noted  Mental Status Report Appearance/Hygiene: Unremarkable Eye Contact: Good Motor Activity: Unremarkable Speech: Logical/coherent Level of Consciousness: Alert Mood: Depressed Affect: Appropriate to circumstance Anxiety Level: None Thought Processes: Coherent, Relevant Judgement: Partial Orientation: Person, Time, Place, Situation Obsessive Compulsive  Thoughts/Behaviors: None  Cognitive Functioning Concentration: Normal Memory: Recent Intact, Remote Intact IQ: Average Insight: see judgement above Impulse Control: Unable to Assess Appetite: Good Sleep: Decreased Total Hours of Sleep: 2 Vegetative Symptoms: None  ADLScreening [facility] Assessment Services) Patient's cognitive ability adequate to safely complete daily activities?: Yes Patient able to express need for assistance with ADLs?: Yes Independently performs ADLs?: Yes (appropriate for developmental age)  Prior Inpatient Therapy Prior Inpatient Therapy: No  Prior Outpatient Therapy Prior Outpatient Therapy: Yes Prior Therapy Dates: when patient was 5-66 years old Prior Therapy Facilty/Provider(s): unknown Reason for Treatment: SI Does patient have an ACCT team?: No Does patient have Intensive In-House Services?  : No Does patient have [facility] services? : No Does patient have P4CC services?: No  ADL Screening (condition at time of admission) Patient's cognitive ability adequate to safely complete daily activities?: Yes Is the patient deaf or have difficulty hearing?: No Does the patient have difficulty seeing, even when wearing glasses/contacts?: No Does the patient have difficulty concentrating, remembering, or making decisions?: No Patient able to express need for assistance with ADLs?: Yes Does the patient have difficulty dressing or bathing?: No Independently performs ADLs?: Yes (appropriate for developmental age) Does the patient have difficulty walking or climbing stairs?: No Weakness of Legs: None Weakness of Arms/Hands: Right (rotator cuff tear)  Home Assistive Devices/Equipment Home Assistive Devices/Equipment: None    Abuse/Neglect Assessment (Assessment to be complete while patient is alone) Physical  Abuse: Denies Verbal Abuse: Denies Sexual Abuse: Denies Exploitation of patient/patient's resources: Denies Self-Neglect: Denies Values /  Beliefs Cultural Requests During Hospitalization: None Spiritual Requests During Hospitalization: None Consults Spiritual Care Consult Needed: No Social Work Consult Needed: No Regulatory affairs officer (For Healthcare) Does patient have an advance directive?: No Would patient like information on creating an advanced directive?: No - patient declined information    Additional Information 1:1 In Past 12 Months?: No CIRT Risk: No Elopement Risk: No Does patient have medical clearance?: Yes     Disposition:  Disposition Initial Assessment Completed for this Encounter: Yes (consulted with Elmarie Shiley, NP) Disposition of Patient: Inpatient treatment program Type of inpatient treatment program: Adult (TTS to seek placement)  Rexene Edison 03/29/2016 11:14 AM

## 2016-03-29 NOTE — Progress Notes (Signed)
Patient did not attend wrap up group. 

## 2016-03-29 NOTE — Progress Notes (Signed)
Pt accepted to [facility] 407-2, attending Dr. Parke Poisson. Report can be called at [phone], and pt can arrive 13:00 per [facility]. Voluntary consent once signed by pt can be faxed to [phone].   Sharren Bridge, MSW, LCSW Clinical Social Work, Disposition  03/29/2016 [phone]

## 2016-03-29 NOTE — Tx Team (Signed)
Initial Treatment Plan 03/29/2016 4:48 PM Genni Renato Shin [id number]    PATIENT STRESSORS: Financial difficulties Occupational concerns Substance abuse   PATIENT STRENGTHS: Average or above average intelligence Communication skills Religious Affiliation Supportive family/friends   PATIENT IDENTIFIED PROBLEMS: At risk for suicide  Depression  "Trying to stay calm"  "Cope with stress"               DISCHARGE CRITERIA:  Ability to meet basic life and health needs Improved stabilization in mood, thinking, and/or behavior Motivation to continue treatment in a less acute level of care Need for constant or close observation no longer present Reduction of life-threatening or endangering symptoms to within safe limits  PRELIMINARY DISCHARGE PLAN: Outpatient therapy Return to previous living arrangement  PATIENT/FAMILY INVOLVEMENT: This treatment plan has been presented to and reviewed with the patient, Annette Chang.  The patient and family have been given the opportunity to ask questions and make suggestions.  Dustin Flock, RN 03/29/2016, 4:48 PM

## 2016-03-29 NOTE — Progress Notes (Signed)
Admission Note:  45 year old female who presents voluntary, in no acute distress, for the treatment of SI and Depression. Patient appears flat, depressed, and anxious. Patient was calm and cooperative with admission process. Patient presents with passive SI "Sometimes. Yes".  Patient contracts for safety upon admission. Patient denies AVH.  Prior to admission, patient was SI with a plan to "take pills".  Patient reports multiple stressors to include "recently got laid off, bills piling up, it's school time and my daughter needs things".  Patient verbalizes "I'm just tired and ready to give up". "I'm stressed out".  Patient has complaints of a headache with sensitivity to light.  Patient reports poor sleep stating "I toss and turn in the middle of the night".  Patient currently lives with her fiance and daughter (17 years old) and identifies "family" as her support system.  While at [facility], patient would like to work on "trying to stay calm" and "cope with stress".  Skin was assessed and found to be clear of any abnormal marks.   Patient searched and no contraband found, POC and unit policies explained and understanding verbalized. Consents obtained. Food and fluids offered and accepted. Patient had no additional questions or concerns.

## 2016-03-30 DIAGNOSIS — R45851 Suicidal ideations: Secondary | ICD-10-CM

## 2016-03-30 DIAGNOSIS — F4323 Adjustment disorder with mixed anxiety and depressed mood: Secondary | ICD-10-CM

## 2016-03-30 MED ORDER — ENSURE ENLIVE PO LIQD
237.0000 mL | Freq: Two times a day (BID) | ORAL | Status: DC
  Administered 2016-03-30 – 2016-04-01 (×4): 237 mL via ORAL

## 2016-03-30 MED ORDER — MIRTAZAPINE 7.5 MG PO TABS
7.5000 mg | ORAL_TABLET | Freq: Every day | ORAL | Status: DC
  Administered 2016-03-30 – 2016-03-31 (×2): 7.5 mg via ORAL
  Filled 2016-03-30 (×5): qty 1

## 2016-03-30 NOTE — BHH Suicide Risk Assessment (Signed)
[facility] Admission Suicide Risk Assessment   Nursing information obtained from:  Patient Demographic factors:  Unemployed Current Mental Status:  Suicidal ideation indicated by patient, Self-harm thoughts Loss Factors:  Financial problems / change in socioeconomic status Historical Factors:  NA Risk Reduction Factors:  Responsible for children under 46 years of age, Sense of responsibility to family, Living with another person, especially a relative, Positive social support  Total Time spent with patient: 45 minutes Principal Problem: Suicidal ideations Diagnosis:   Patient Active Problem List   Diagnosis Date Noted  . Suicidal ideations [R45.851] 03/30/2016  . MDD (major depressive disorder) ([place]) [F32.9] 03/29/2016  . Breast pain [N64.4] 01/18/2016  . 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 Chronic urticaria [L50.8] 08/06/2015  . Paresthesia [R20.2] 04/15/2015  . Headache [R51] 02/20/2015  . 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 Right shoulder pain [M25.511] 08/22/2014  . DUB (dysfunctional uterine bleeding) [N93.8] 03/11/2014  . Breast cancer screening [Z12.39] 10/10/2012  . Screening for cervical cancer [Z12.4] 05/03/2012  . H/O rotator cuff surgery [Z98.890] 01/02/2012  . 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 H/O: C-section [id number] 01/02/2012  . Iron deficiency anemia [D50.9] 01/02/2012  . H/O tubal ligation [Z98.51] 01/02/2012  . Depression [F32.9] 01/02/2012     Continued Clinical Symptoms:  Alcohol Use Disorder Identification Test Final Score (AUDIT): 0 The "Alcohol Use Disorders Identification Test", Guidelines for Use in Primary Care, Second Edition.  World Pharmacologist [facility]). Score between 0-7:  no or low risk or alcohol related problems. Score between 8-15:  moderate risk of alcohol related problems. Score between 16-19:  high risk of alcohol related problems. Score 20 or above:  warrants further diagnostic  evaluation for alcohol dependence and treatment.   CLINICAL FACTORS:  45 year old female, recently overwhelmed by financial difficulties and after her employer closed the business and moved away without paying her . Presented to ED with headache, now resolved, and made vague suicidal statements of seeing herself in a casket . Today feeling better , denies active SI at this time.      Psychiatric Specialty Exam: Physical Exam  ROS  Blood pressure 119/86, pulse 87, temperature 98.5 F (36.9 C), temperature source Oral, resp. rate 16, height 5\' 1"  (1.549 m), weight 90 lb (40.8 kg), SpO2 100 %.Body mass index is 17.01 kg/m.   see admit note MSE    COGNITIVE FEATURES THAT CONTRIBUTE TO RISK:  Closed-mindedness and Loss of executive function    SUICIDE RISK:   Mild:  Suicidal ideation of limited frequency, intensity, duration, and specificity.  There are no identifiable plans, no associated intent, mild dysphoria and related symptoms, good self-control (both objective and subjective assessment), few other risk factors, and identifiable protective factors, including available and accessible social support.   PLAN OF CARE:  Inpatient admission as above   I certify that inpatient services furnished can reasonably be expected to improve the patient's condition.  Neita Garnet, MD 03/30/2016, 4:37 PM

## 2016-03-30 NOTE — Progress Notes (Signed)
Adult Psychoeducational Group Note  Date:  03/30/2016 Time:  11:14 PM  Group Topic/Focus:  Wrap-Up Group:   The focus of this group is to help patients review their daily goal of treatment and discuss progress on daily workbooks.   Participation Level:  Active  Participation Quality:  Appropriate  Affect:  Appropriate  Cognitive:  Alert, Appropriate and Oriented  Insight: Appropriate  Engagement in Group:  Engaged  Modes of Intervention:  Discussion  Additional Comments:  Patient attended wrap-up group and said her day was a 8. Her goal for today was to attended all groups and participate in them and she did.  Her coping skills are medication and reading.  Mollie Rossano W Anayely Constantine 0000000, 11:14 PM
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
## 2016-03-30 NOTE — Progress Notes (Signed)
Recreation Therapy Notes  Date: 03/30/16 Time: 0930 Location: 300 Hall Group Room  Group Topic: Stress Management  Goal Area(s) Addresses:  Patient will verbalize importance of using healthy stress management.  Patient will identify positive emotions associated with healthy stress management.   Behavioral Response: Engaged  Intervention: Stress Management  Activity :  Progressive Muscle Relaxation.  LRT introduced the technique of progressive muscle relaxation to patients.  LRT read script so patients to engage in technique.  Patients were to sit in a comfortable position and follow along as LRT read script.  Education:  Stress Management, Discharge Planning.   Education Outcome: Acknowledges edcuation/In group clarification offered/Needs additional education  Clinical Observations/Feedback: Pt attended group.    Victorino Sparrow, LRT/CTRS         Victorino Sparrow A 03/30/2016 12:22 PM

## 2016-03-30 NOTE — BHH Group Notes (Signed)
[place] LCSW Group Therapy 03/30/2016 1:15 PM  Type of Therapy: Group Therapy- Emotion Regulation  Pt was present for part of the group discussion but did not participate. Pt left early and did not return.   Peri Maris, [place] 03/30/2016 2:52 PM
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
## 2016-03-30 NOTE — Progress Notes (Signed)
Pt has spent most of the evening in her room.  She reports that she is still feeling depressed and helpless with her life situation, but pt denies SI/HI/AVH at this time.  She presents as sad and depressed.  She says that she has difficulty sleeping, so writer informed pt of the prn medications available to her tonight for anxiety and sleep.  Pt chose to take the Vistaril as she says she is not used to taking much medication.  Support and encouragement offered.  Pt did not attend evening group.  She did come to the dayroom for a short time before receiving her prn med.  Discharge plans are in process.  Safety maintained with q15 minute checks.

## 2016-03-30 NOTE — Progress Notes (Signed)
Nursing Note 03/30/2016 [id number]  Data Patient being monitored inpatient for anxiety and SI with no plan.  Reports sleeping fair with PRN sleep med.  Rates depression 7/10, hopelessness 7/10, and anxiety 6/10. Affect bright mood "happy."  Denies HI, SI, AVH.  Patient spoke about how she has a job lined up and she is feeling good. Was requesting to know when she would discharge because she said the supervisor could hold the position as long as the supervisor knew when the patient could start.  Attending groups, spending free time in day area, interacting appropriately with staff and peers.   Action Spoke with patient 1:1, nurse offered support to patient throughout shift.  Spoke with treatment team about patient's concerns.  Continues to be monitored on 15 minute checks for safety.  Response Remains safe and appropriate on unit.

## 2016-03-30 NOTE — Tx Team (Signed)
Interdisciplinary Treatment and Diagnostic Plan Update  03/30/2016 Time of Session: 8:41 AM  Annette Chang MRN: [id number]  Principal Diagnosis: Suicidal ideations  Secondary Diagnoses: Active Problems:   MDD (major depressive disorder) (HCC)   Current Medications:  Current Facility-Administered Medications  Medication Dose Route Frequency Provider Last Rate Last Dose  . alum & mag hydroxide-simeth (MAALOX/MYLANTA) 200-200-20 MG/5ML suspension 30 mL  30 mL Oral Q4H PRN Myer Peer Cobos, MD      . feeding supplement (ENSURE ENLIVE) (ENSURE ENLIVE) liquid 237 mL  237 mL Oral BID BM Myer Peer Cobos, MD   237 mL at 03/31/16 0930  . hydrOXYzine (ATARAX/VISTARIL) tablet 25 mg  25 mg Oral Q6H PRN Jenne Campus, MD   25 mg at 03/29/16 2215  . magnesium hydroxide (MILK OF MAGNESIA) suspension 30 mL  30 mL Oral Daily PRN Jenne Campus, MD      . mirtazapine (REMERON) tablet 7.5 mg  7.5 mg Oral QHS Myer Peer Cobos, MD   7.5 mg at 03/30/16 2126    PTA Medications: Prescriptions Prior to Admission  Medication Sig Dispense Refill Last Dose  . acetaminophen (TYLENOL) 325 MG tablet Take 650 mg by mouth every 6 (six) hours as needed for moderate pain or headache.   PRN  . cetirizine (ZYRTEC) 10 MG tablet Take 1 tablet (10 mg total) by mouth daily. 30 tablet 11 03/28/2016 at Unknown time  . gabapentin (NEURONTIN) 100 MG capsule One capsule three times a day for 2 weeks, then take 2 capsules three times a day (Patient taking differently: Take 200 mg by mouth 3 (three) times daily. One capsule three times a day for 2 weeks, then take 2 capsules three times a day) 180 capsule 4 03/28/2016 at Unknown time  . naproxen (NAPROSYN) 500 MG tablet Take 1 tablet with meal up to 2 times daily as needed for headache. (Patient taking differently: Take 500 mg by mouth 2 (two) times daily with a meal. Take 1 tablet with meal up to 2 times daily as needed for headache.) 20 tablet 0 03/28/2016 at Unknown time     Treatment Modalities: Medication Management, Group therapy, Case management,  1 to 1 session with clinician, Psychoeducation, Recreational therapy.   Physician Treatment Plan for Primary Diagnosis: Suicidal ideations Long Term Goal(s): Improvement in symptoms so as ready for discharge  Short Term Goals: Ability to verbalize feelings will improve and Ability to identify and develop effective coping behaviors will improve  Medication Management: Evaluate patient's response, side effects, and tolerance of medication regimen.  Therapeutic Interventions: 1 to 1 sessions, Unit Group sessions and Medication administration.  Evaluation of Outcomes: Not Met  Physician Treatment Plan for Secondary Diagnosis: Active Problems:   MDD (major depressive disorder) ([place])   Long Term Goal(s): Improvement in symptoms so as ready for discharge  Short Term Goals: Ability to identify and develop effective coping behaviors will improve  Medication Management: Evaluate patient's response, side effects, and tolerance of medication regimen.  Therapeutic Interventions: 1 to 1 sessions, Unit Group sessions and Medication administration.  Evaluation of Outcomes: Not Met   RN Treatment Plan for Primary Diagnosis: Suicidal ideations Long Term Goal(s): Knowledge of disease and therapeutic regimen to maintain health will improve  Short Term Goals: Ability to verbalize feelings will improve and Ability to identify and develop effective coping behaviors will improve  Medication Management: RN will administer medications as ordered by provider, will assess and evaluate patient's response and provide education to patient for  prescribed medication. RN will report any adverse and/or side effects to prescribing provider.  Therapeutic Interventions: 1 on 1 counseling sessions, Psychoeducation, Medication administration, Evaluate responses to treatment, Monitor vital signs and CBGs as ordered, Perform/monitor CIWA,  COWS, AIMS and Fall Risk screenings as ordered, Perform wound care treatments as ordered.  Evaluation of Outcomes: Not Met   LCSW Treatment Plan for Primary Diagnosis: Suicidal ideations Long Term Goal(s): Safe transition to appropriate next level of care at discharge, Engage patient in therapeutic group addressing interpersonal concerns.  Short Term Goals: Engage patient in aftercare planning with referrals and resources, Increase emotional regulation, Identify triggers associated with mental health/substance abuse issues and Increase skills for wellness and recovery  Therapeutic Interventions: Assess for all discharge needs, 1 to 1 time with Social worker, Explore available resources and support systems, Assess for adequacy in community support network, Educate family and significant other(s) on suicide prevention, Complete Psychosocial Assessment, Interpersonal group therapy.  Evaluation of Outcomes: Not Met   Progress in Treatment: Attending groups: Pt is new to milieu, continuing to assess  Participating in groups: Pt is new to milieu, continuing to assess  Taking medication as prescribed: Yes, MD continues to assess for medication changes as needed Toleration medication: Yes, no side effects reported at this time Family/Significant other contact made: No, CSW assessing for appropriate contact Patient understands diagnosis: Continuing to assess Discussing patient identified problems/goals with staff: Yes Medical problems stabilized or resolved: Yes Denies suicidal/homicidal ideation: Yes, currently Issues/concerns per patient self-inventory: None Other: N/A  New problem(s) identified: None identified at this time.   New Short Term/Long Term Goal(s): None identified at this time.   Discharge Plan or Barriers: Pt will return home and follow-up with outpatient services.   Reason for Continuation of Hospitalization: Depression Hallucinations Medication stabilization Suicidal  ideation   Estimated Length of Stay: 2-3 days  Attendees: Patient: 03/30/2016  8:41 AM  Physician: Dr. Parke Poisson 03/30/2016  8:41 AM  Nursing: Eulogio Bear, Gaylan Gerold 03/30/2016  8:41 AM  RN Care Manager: Lars Pinks, RN 03/30/2016  8:41 AM  Social Worker: Peri Maris, LCSW; Erasmo Downer Drinkard, LCSW 03/30/2016  8:41 AM  Recreational Therapist:  03/30/2016  8:41 AM  Other: Lindell Spar, NP; Samuel Jester, NP 03/30/2016  8:41 AM  Other:  03/30/2016  8:41 AM  Other: 03/30/2016  8:41 AM    Scribe for Treatment Team: Bo Mcclintock, LCSW 03/30/2016 8:41 AM

## 2016-03-31 DIAGNOSIS — R45851 Suicidal ideations: Secondary | ICD-10-CM

## 2016-03-31 DIAGNOSIS — F329 Major depressive disorder, single episode, unspecified: Secondary | ICD-10-CM

## 2016-03-31 LAB — TSH: TSH: 0.87 u[IU]/mL (ref 0.350–4.500)

## 2016-03-31 NOTE — BHH Group Notes (Signed)
Adult Psychoeducational Group Note  Date:  03/31/2016 Time:  0900-0930  Group Topic/Focus:  Nurse education group: Leisure and lifestyle changes.  First patient's identified one gift, one stressor, and one leisure activity.  We discussed the importance of leisure and scheduling leisure activities to provide work/life balance.  Participation Level:  Active  Participation Quality:  Appropriate and Attentive  Affect:  Bright  Cognitive:  Alert and Appropriate  Insight: Appropriate  Engagement in Group:  Engaged  Modes of Intervention:  Activity, Clarification, Education and Rapport Building  Additional Comments:  Patient attended group for entire time, listed a gift, stressor, and a leisure activity.

## 2016-03-31 NOTE — Progress Notes (Signed)
Nursing Note 03/31/2016 [id number]  Data Patient being monitored inpatient for increased anxiety and SI with no plan. Reports sleeping good without PRN sleep med.  Rates depression 0/10, hopelessness 0/10, and anxiety 0/10. Affect bright mood euthymic.  Denies HI, SI, AVH.   Reports good appetite, eating well, taking ensure supplement.  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   Action Spoke with patient 1:1, nurse offered support to patient throughout shift.  Continues to be monitored on 15 minute checks for safety.  Response Remains safe and appropriate on unit.

## 2016-03-31 NOTE — Progress Notes (Signed)
Nutrition Education Note  Pt attended group focusing on general, healthful nutrition education.  RD emphasized the importance of eating regular meals and snacks throughout the day. Consuming sugar-free beverages and incorporating fruits and vegetables into diet when possible. Provided examples of healthy snacks. Patient encouraged to leave group with a goal to improve nutrition/healthy eating.   Diet Order: Diet regular Room service appropriate? Yes; Fluid consistency: Thin Pt is also offered choice of unit snacks mid-morning and mid-afternoon.  Pt is eating as desired.   If additional nutrition issues arise, please consult RD.    Jarome Matin, MS, RD, LDN Inpatient Clinical Dietitian Pager # [phone] After hours/weekend pager # [phone]

## 2016-03-31 NOTE — BHH Group Notes (Signed)
[facility] Mental Health Association Group Therapy 03/31/2016 1:15pm  Type of Therapy: Mental Health Association Presentation  Participation Level: Active  Participation Quality: Attentive  Affect: Appropriate  Cognitive: Oriented  Insight: Developing/Improving  Engagement in Therapy: Engaged  Modes of Intervention: Discussion, Education and Socialization  Summary of Progress/Problems: Mental Health Association ([place]) Speaker came to talk about his personal journey with substance abuse and addiction. The pt processed ways by which to relate to the speaker. [place] speaker provided handouts and educational information pertaining to groups and services offered by the [facility]. Pt was engaged in speaker's presentation and was receptive to resources provided.    Peri Maris, LCSWA 03/31/2016 1:42 PM

## 2016-03-31 NOTE — Progress Notes (Signed)
[facility] MD Progress Note  03/31/2016 3:14 PM Annette Chang  MRN:  [id number] Subjective:  "I lost my job.  I may have a job.  God is good."  Objective:  Patient sates that she came in after having suicidal ideations from losing a job.  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  She denied SI HI and AVH.  She reports that she is tolerating her anti depressants.  She is looking forward to a job that her father help to line up of or her.  No disruptive behaviors on the unit.    Principal Problem: Suicidal ideations Diagnosis:   Patient Active Problem List   Diagnosis Date Noted  . Suicidal ideations [R45.851] 03/30/2016  . MDD (major depressive disorder) ([place]) [F32.9] 03/29/2016  . Breast pain [N64.4] 01/18/2016  . 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 Chronic urticaria [L50.8] 08/06/2015  . Paresthesia [R20.2] 04/15/2015  . Headache [R51] 02/20/2015  . 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 Right shoulder pain [M25.511] 08/22/2014  . DUB (dysfunctional uterine bleeding) [N93.8] 03/11/2014  . Breast cancer screening [Z12.39] 10/10/2012  . Screening for cervical cancer [Z12.4] 05/03/2012  . H/O rotator cuff surgery [Z98.890] 01/02/2012  . 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 H/O: C-section [id number] 01/02/2012  . Iron deficiency anemia [D50.9] 01/02/2012  . H/O tubal ligation [Z98.51] 01/02/2012  . Depression [F32.9] 01/02/2012   Total Time spent with patient: 30 minutes  Past Psychiatric History: see HPI  Past Medical History:  Past Medical History:  Diagnosis Date  . 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 Depression   . H/O rotator cuff surgery 01/02/2012  . H/O tubal ligation 01/02/2012  . H/O: C-section 01/02/2012   3   . H/O: C-section 01/02/2012   3   . Headache(784.0) 01/02/2012  . Iron deficiency anemia 01/02/2012  . 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 CESAREAN SECTION     3 previous c sections  . NOVASURE ABLATION N/A 03/11/2014   Procedure: NOVASURE ABLATION;  Surgeon: Emily Filbert, MD;  Location: [facility];  Service: Gynecology;  Laterality: N/A;  . rotator cuff surgery    . SHOULDER SURGERY    . TUBAL LIGATION     Family History:  Family History  Problem Relation Age of Onset  . Diabetes Maternal Grandmother   . Diabetes Paternal Grandmother   . Diabetes Paternal Grandfather    Family Psychiatric  History: see HPI Social History:  History  Alcohol Use  . 8.4 oz/week  . 14 Glasses of wine per week    Comment: weekends     History  Drug Use No    Social History   Social History  . Marital status: Single    Spouse name: N/A  . Number of children: 2  . Years of education: 12   Occupational History  . unemployed    Social History Main Topics  . Smoking status: Former Research scientist (life sciences)  . Smokeless tobacco: Never Used  . Alcohol use 8.4 oz/week    14 Glasses of wine per week     Comment: weekends  . Drug use: No  . Sexual activity: Not Currently    Birth control/ protection: Surgical   Other Topics Concern  . None   Social History Narrative   Patient does not drink caffeine.   Patient is right  handed.    Additional Social History:   Sleep: Good  Appetite:  Good  Current Medications: Current Facility-Administered Medications  Medication Dose Route Frequency Provider Last Rate Last Dose  . alum & mag hydroxide-simeth (MAALOX/MYLANTA) 200-200-20 MG/5ML suspension 30 mL  30 mL Oral Q4H PRN Myer Peer Cobos, MD      . feeding supplement (ENSURE ENLIVE) (ENSURE ENLIVE) liquid 237 mL  237 mL Oral BID BM Myer Peer Cobos, MD   237 mL at 03/31/16 1430  . hydrOXYzine (ATARAX/VISTARIL) tablet 25 mg  25 mg Oral Q6H PRN Jenne Campus, MD   25 mg at 03/29/16 2215  . magnesium hydroxide (MILK OF MAGNESIA) suspension 30 mL  30 mL Oral Daily PRN Jenne Campus, MD      . mirtazapine (REMERON)  tablet 7.5 mg  7.5 mg Oral QHS Jenne Campus, MD   7.5 mg at 03/30/16 2126    Lab Results:  Results for orders placed or performed during the hospital encounter of 03/29/16 (from the past 48 hour(s))  TSH     Status: None   Collection Time: 03/31/16  6:12 AM  Result Value Ref Range   TSH 0.870 0.350 - 4.500 uIU/mL    Comment: Performed at [facility]    Blood Alcohol level:  Lab Results  Component Value Date   [facility] <5 0000000    Metabolic Disorder Labs: Lab Results  Component Value Date   HGBA1C 5.0 08/09/2013   No results found for: PROLACTIN No results found for: CHOL, TRIG, HDL, CHOLHDL, VLDL, LDLCALC  Physical Findings: AIMS: Facial and Oral Movements Muscles of Facial Expression: None, normal Lips and Perioral Area: None, normal Jaw: None, normal Tongue: None, normal,Extremity Movements Upper (arms, wrists, hands, fingers): None, normal Lower (legs, knees, ankles, toes): None, normal, Trunk Movements Neck, shoulders, hips: None, normal, Overall Severity Severity of abnormal movements (highest score from questions above): None, normal Incapacitation due to abnormal movements: None, normal Patient's awareness of abnormal movements (rate only patient's report): No Awareness, Dental Status Current problems with teeth and/or dentures?: No Does patient usually wear dentures?: No  CIWA:    COWS:     Musculoskeletal: Strength & Muscle Tone: within normal limits Gait & Station: normal Patient leans: N/A  Psychiatric Specialty Exam: Physical Exam  Nursing note and vitals reviewed.   ROS  Blood pressure 106/82, pulse 73, temperature 98.2 F (36.8 C), temperature source Oral, resp. rate 16, height 5\' 1"  (1.549 m), weight 40.8 kg (90 lb), SpO2 100 %.Body mass index is 17.01 kg/m.  General Appearance: Neat  Eye Contact:  Good  Speech:  Clear and Coherent  Volume:  Normal  Mood:  Euthymic  Affect:  Appropriate and Congruent  Thought Process:   Coherent  Orientation:  Full (Time, Place, and Person)  Thought Content:  Rumination  Suicidal Thoughts:  No  Homicidal Thoughts:  No  Memory:  Immediate;   Good Recent;   Good Remote;   Good  Judgement:  Good  Insight:  Good  Psychomotor Activity:  Normal  Concentration:  Concentration: Fair and Attention Span: Fair  Recall:  [employer] of Knowledge:  Fair  Language:  Fair  Akathisia:  Negative  Handed:  Right  AIMS (if indicated):     Assets:  Desire for Improvement Physical Health Resilience Social Support  ADL's:  Intact  Cognition:  WNL  Sleep:  Number of Hours: 6.5    Treatment Plan Summary: Review of chart, vital  signs, medications, and notes.  1-Individual and group therapy  2-Medication management for depression and anxiety: Medications reviewed with patient.  3-Coping skills for depression, anxiety  4-Continue crisis stabilization and management  5-Address health issues--monitoring vital signs, stable  6-Treatment plan in progress to prevent relapse of depression and anxiety  Janett Labella, NP [facility] 03/31/2016, 3:14 PM   Agree with NP progress note as above  Gabriel Earing, MD

## 2016-03-31 NOTE — Progress Notes (Signed)
Patient did attend the evening karaoke group. Pt was engaged, supportive, and participated by singing a song.

## 2016-03-31 NOTE — Progress Notes (Signed)
   D: Pt was appeared to be excited as she informed the writer that she's scheduled for discharge tomorrow. Stated, she's to report to work at [employer] tomorrow evening. Pt states she plans to go to groups during the day. Pt has no questions or concerns.    A:  Support and encouragement was offered. 15 min checks continued for safety.  R: Pt remains safe.

## 2016-04-01 DIAGNOSIS — F332 Major depressive disorder, recurrent severe without psychotic features: Secondary | ICD-10-CM

## 2016-04-01 MED ORDER — MIRTAZAPINE 7.5 MG PO TABS: 7.5000 mg | ORAL_TABLET | Freq: Every day | ORAL | 0 refills | Status: DC

## 2016-04-01 MED ORDER — HYDROXYZINE HCL 25 MG PO TABS: 25.0000 mg | ORAL_TABLET | Freq: Four times a day (QID) | ORAL | 0 refills | Status: DC | PRN

## 2016-04-01 NOTE — BHH Suicide Risk Assessment (Signed)
[facility] Discharge Suicide Risk Assessment   Principal Problem: Suicidal ideations Discharge Diagnoses:  Patient Active Problem List   Diagnosis Date Noted  . Suicidal ideations [R45.851] 03/30/2016  . MDD (major depressive disorder) ([place]) [F32.9] 03/29/2016  . Breast pain [N64.4] 01/18/2016  . 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 Chronic urticaria [L50.8] 08/06/2015  . Paresthesia [R20.2] 04/15/2015  . Headache [R51] 02/20/2015  . 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 Right shoulder pain [M25.511] 08/22/2014  . DUB (dysfunctional uterine bleeding) [N93.8] 03/11/2014  . Breast cancer screening [Z12.39] 10/10/2012  . Screening for cervical cancer [Z12.4] 05/03/2012  . H/O rotator cuff surgery [Z98.890] 01/02/2012  . 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 H/O: C-section [id number] 01/02/2012  . Iron deficiency anemia [D50.9] 01/02/2012  . H/O tubal ligation [Z98.51] 01/02/2012  . Depression [F32.9] 01/02/2012    Total Time spent with patient: 30 minutes  Musculoskeletal: Strength & Muscle Tone: within normal limits Gait & Station: normal Patient leans: N/A  Psychiatric Specialty Exam: ROS  Blood pressure 118/84, pulse 66, temperature 98.1 F (36.7 C), temperature source Oral, resp. rate 16, height 5\' 1"  (1.549 m), weight 90 lb (40.8 kg), SpO2 100 %.Body mass index is 17.01 kg/m.  General Appearance: Well Groomed  Eye Contact::  Good  Speech:  Normal Rate409  Volume:  Normal  Mood:  improved, denies depression today, presents euthymic   Affect:  Appropriate and Full Range  Thought Process:  Linear  Orientation:  Full (Time, Place, and Person)  Thought Content:  denies hallucinations, no delusions,not internally preoccupied   Suicidal Thoughts:  No denies any suicidal or self injurious ideations   Homicidal Thoughts:  No denies any homicidal or violent ideations   Memory:  recent and remote grossly intact  Judgement:  Other:   improved   Insight:  improved  Psychomotor Activity:  Normal  Concentration:  Good  Recall:  Good  Fund of Knowledge:Good  Language: Good  Akathisia:  Negative  Handed:  Right  AIMS (if indicated):     Assets:  Communication Skills Desire for Improvement Social Support  Sleep:  Number of Hours: 5  Cognition: WNL  ADL's:  Intact   Mental Status Per Nursing Assessment::   On Admission:  Suicidal ideation indicated by patient, Self-harm thoughts  Demographic Factors:  45 year old female , lives with boyfriend and child   Loss Factors: States 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 without paying her what was owed, financial difficulties   Historical Factors: Depression, no prior psychiatric admissions, no history of suicide attempts   Risk Reduction Factors:   Responsible for children under 75 years of age, Sense of responsibility to family, Living with another person, especially a relative, Positive social support and Positive coping skills or problem solving skills  Continued Clinical Symptoms:  At this time patient is improved compared to admission - presents alert, attentive, well related, mood improved ,and currently euthymic, with a full range of affect, no thought disorder, no suicidal or self injurious ideations, no homicidal or violent ideations, no hallucinations, no delusions, future oriented, plans to start a new job this weekend. Denies medication side effects   Cognitive Features That Contribute To Risk:  No gross cognitive deficits noted upon discharge. Is alert , attentive, and oriented x 3   Suicide Risk:  Mild:  Suicidal ideation of limited frequency, intensity, duration, and specificity.  There are no identifiable plans, no associated intent, mild dysphoria and related  symptoms, good self-control (both objective and subjective assessment), few other risk factors, and identifiable protective factors, including available and accessible social  support.  Follow-up Information    [facility] .   Specialty:  Professional Counselor Why:  Please use the walk-in clinic for your initial assessment for medication management and therapy: Monday-Friday 8am-12pm Contact information: [place] [address] [phone]           Plan Of Care/Follow-up recommendations:  Activity:  as tolerated  Diet:  Regular  Tests:  NA Other:  See below   Patient is leaving in good spirits, returning home. Follow up as above .   Neita Garnet, MD 04/01/2016, 11:10 AM

## 2016-04-01 NOTE — Progress Notes (Signed)
  [facility] Adult Case Management Discharge Plan :  Will you be returning to the same living situation after discharge:  Yes,  Pt returning home At discharge, do you have transportation home?: Yes,  Pt husband to pick up Do you have the ability to pay for your medications: Yes,  Pt provided with prescriptions  Release of information consent forms completed and in the chart;  Patient's signature needed at discharge.  Patient to Follow up at: Follow-up Information    [facility] .   Specialty:  Professional Counselor Why:  Please use the walk-in clinic for your initial assessment for medication management and therapy: Monday-Friday 8am-12pm Contact information: [place] [address] [phone]           Next level of care provider has access to [place] and Suicide Prevention discussed: Yes,  with friend; see SPE note  Have you used any form of tobacco in the last 30 days? (Cigarettes, Smokeless Tobacco, Cigars, and/or Pipes): No  Has patient been referred to the Quitline?: N/A patient is not a smoker  Patient has been referred for addiction treatment: Yes  Annette Chang Annette Chang 04/01/2016, 10:04 AM

## 2016-04-01 NOTE — Progress Notes (Addendum)
Recreation Therapy Notes  Date: 04/01/16 Time: 0930 Location: [address] Dayroom  Group Topic: Stress Management  Goal Area(s) Addresses:  Patient will verbalize importance of using healthy stress management.  Patient will identify positive emotions associated with healthy stress management.   Intervention: Stress Management  Activity :  Guided Imagery.  LRT introduced patients to the technique of guided imagery.  LRT read a script to allow patients to participate in the technique.  Patients were to listen and follow along as LRT read script.  Education:  Stress Management, Discharge Planning.   Education Outcome: Acknowledges edcuation/In group clarification offered/Needs additional education  Clinical Observations/Feedback:  Pt did not attend group.   Victorino Sparrow, LRT/CTRS         Ria Comment, Jonice Cerra A 04/01/2016 11:59 AM

## 2016-04-01 NOTE — [facility] Suicide Risk Assessment (Signed)
[facility] INPATIENT:  Family/Significant Other Suicide Prevention Education  Suicide Prevention Education:  Education Completed; Dub Amis, Pt's friend [phone], has been identified by the patient as the family member/significant other with whom the patient will be residing, and identified as the person(s) who will aid the patient in the event of a mental health crisis (suicidal ideations/suicide attempt).  With written consent from the patient, the family member/significant other has been provided the following suicide prevention education, prior to the and/or following the discharge of the patient.  The suicide prevention education provided includes the following:  Suicide risk factors  Suicide prevention and interventions  National Suicide Hotline telephone number  [facility] assessment telephone number  [facility] Emergency Assistance Ward and/or Residential Mobile Crisis Unit telephone number  Request made of family/significant other to:  Remove weapons (e.g., guns, rifles, knives), all items previously/currently identified as safety concern.    Remove drugs/medications (over-the-counter, prescriptions, illicit drugs), all items previously/currently identified as a safety concern.  The family member/significant other verbalizes understanding of the suicide prevention education information provided.  The family member/significant other agrees to remove the items of safety concern listed above.  Bo Mcclintock 04/01/2016, 10:03 AM

## 2016-04-01 NOTE — Tx Team (Signed)
Interdisciplinary Treatment and Diagnostic Plan Update  04/01/2016 Time of Session: 9:59 AM  Annette Chang MRN: [id number]  Principal Diagnosis: Suicidal ideations  Secondary Diagnoses: Principal Problem:   Suicidal ideations Active Problems:   MDD (major depressive disorder) (HCC)   Current Medications:  Current Facility-Administered Medications  Medication Dose Route Frequency Provider Last Rate Last Dose  . alum & mag hydroxide-simeth (MAALOX/MYLANTA) 200-200-20 MG/5ML suspension 30 mL  30 mL Oral Q4H PRN Myer Peer Cobos, MD      . feeding supplement (ENSURE ENLIVE) (ENSURE ENLIVE) liquid 237 mL  237 mL Oral BID BM Myer Peer Cobos, MD   237 mL at 03/31/16 1430  . hydrOXYzine (ATARAX/VISTARIL) tablet 25 mg  25 mg Oral Q6H PRN Jenne Campus, MD   25 mg at 03/29/16 2215  . magnesium hydroxide (MILK OF MAGNESIA) suspension 30 mL  30 mL Oral Daily PRN Jenne Campus, MD      . mirtazapine (REMERON) tablet 7.5 mg  7.5 mg Oral QHS Myer Peer Cobos, MD   7.5 mg at 03/31/16 2137    PTA Medications: Prescriptions Prior to Admission  Medication Sig Dispense Refill Last Dose  . acetaminophen (TYLENOL) 325 MG tablet Take 650 mg by mouth every 6 (six) hours as needed for moderate pain or headache.   PRN  . cetirizine (ZYRTEC) 10 MG tablet Take 1 tablet (10 mg total) by mouth daily. 30 tablet 11 03/28/2016 at Unknown time  . gabapentin (NEURONTIN) 100 MG capsule One capsule three times a day for 2 weeks, then take 2 capsules three times a day (Patient taking differently: Take 200 mg by mouth 3 (three) times daily. One capsule three times a day for 2 weeks, then take 2 capsules three times a day) 180 capsule 4 03/28/2016 at Unknown time  . naproxen (NAPROSYN) 500 MG tablet Take 1 tablet with meal up to 2 times daily as needed for headache. (Patient taking differently: Take 500 mg by mouth 2 (two) times daily with a meal. Take 1 tablet with meal up to 2 times daily as needed for headache.) 20  tablet 0 03/28/2016 at Unknown time    Treatment Modalities: Medication Management, Group therapy, Case management,  1 to 1 session with clinician, Psychoeducation, Recreational therapy.   Physician Treatment Plan for Primary Diagnosis: Suicidal ideations Long Term Goal(s): Improvement in symptoms so as ready for discharge  Short Term Goals: Ability to verbalize feelings will improve and Ability to identify and develop effective coping behaviors will improve  Medication Management: Evaluate patient's response, side effects, and tolerance of medication regimen.  Therapeutic Interventions: 1 to 1 sessions, Unit Group sessions and Medication administration.  Evaluation of Outcomes: Adequate for Discharge  Physician Treatment Plan for Secondary Diagnosis: Principal Problem:   Suicidal ideations Active Problems:   MDD (major depressive disorder) ([place])   Long Term Goal(s): Improvement in symptoms so as ready for discharge  Short Term Goals: Ability to identify and develop effective coping behaviors will improve  Medication Management: Evaluate patient's response, side effects, and tolerance of medication regimen.  Therapeutic Interventions: 1 to 1 sessions, Unit Group sessions and Medication administration.  Evaluation of Outcomes: Adequate for Discharge   RN Treatment Plan for Primary Diagnosis: Suicidal ideations Long Term Goal(s): Knowledge of disease and therapeutic regimen to maintain health will improve  Short Term Goals: Ability to verbalize feelings will improve and Ability to identify and develop effective coping behaviors will improve  Medication Management: RN will administer medications as ordered  by provider, will assess and evaluate patient's response and provide education to patient for prescribed medication. RN will report any adverse and/or side effects to prescribing provider.  Therapeutic Interventions: 1 on 1 counseling sessions, Psychoeducation, Medication  administration, Evaluate responses to treatment, Monitor vital signs and CBGs as ordered, Perform/monitor CIWA, COWS, AIMS and Fall Risk screenings as ordered, Perform wound care treatments as ordered.  Evaluation of Outcomes: Adequate for Discharge   LCSW Treatment Plan for Primary Diagnosis: Suicidal ideations Long Term Goal(s): Safe transition to appropriate next level of care at discharge, Engage patient in therapeutic group addressing interpersonal concerns.  Short Term Goals: Engage patient in aftercare planning with referrals and resources, Increase emotional regulation, Identify triggers associated with mental health/substance abuse issues and Increase skills for wellness and recovery  Therapeutic Interventions: Assess for all discharge needs, 1 to 1 time with Social worker, Explore available resources and support systems, Assess for adequacy in community support network, Educate family and significant other(s) on suicide prevention, Complete Psychosocial Assessment, Interpersonal group therapy.  Evaluation of Outcomes: Adequate for Discharge   Progress in Treatment: Attending groups: Yes Participating in groups: Minimally Taking medication as prescribed: Yes, MD continues to assess for medication changes as needed Toleration medication: Yes, no side effects reported at this time Family/Significant other contact made: Yes with friend Patient understands diagnosis: Yes AEB by seeking help with depression Discussing patient identified problems/goals with staff: Yes Medical problems stabilized or resolved: Yes Denies suicidal/homicidal ideation: Yes, currently Issues/concerns per patient self-inventory: None Other: N/A  New problem(s) identified: None identified at this time.   New Short Term/Long Term Goal(s): None identified at this time.   Discharge Plan or Barriers: Pt will return home and follow-up with outpatient services.   Reason for Continuation of  Hospitalization: Depression Hallucinations Medication stabilization Suicidal ideation   Estimated Length of Stay: 0 days  Attendees: Patient: 04/01/2016  9:59 AM  Physician: Dr. Parke Poisson 04/01/2016  9:59 AM  Nursing: Eulogio Bear, Gaylan Gerold 04/01/2016  9:59 AM  RN Care Manager: Lars Pinks, RN 04/01/2016  9:59 AM  Social Worker: Peri Maris, LCSW 04/01/2016  9:59 AM  Recreational Therapist:  04/01/2016  9:59 AM  Other: Lindell [facility], NP; Samuel Jester, NP 04/01/2016  9:59 AM  Other:  04/01/2016  9:59 AM  Other: 04/01/2016  9:59 AM    Scribe for Treatment Team: Bo Mcclintock, LCSW 04/01/2016 9:59 AM

## 2016-04-01 NOTE — Progress Notes (Signed)
Annette Chang is readied for disharge now " and to begin work".Marland Kitchenaortic stenosis soon as she gets discharged.. She explains that she has to go to one side of town.[place] Kitchen an ee badge and then go to another side of town  and begin her work!She is very excited to have a new job! She completes her daily assessment and on it she writes she deneis SI today and she rates her depression, hopelessness and anxiety " 00/0/0", repsectively. She is given DC instructions ( AVS, SRA and transition form) and then all belongings are returned to her as well. She is escorted to the building entrance and dc'd. c of suicde risk assessment included.

## 2016-04-01 NOTE — Discharge Summary (Signed)
Physician Discharge Summary Note  Patient:  Annette Chang is an 45 y.o., female MRN:  [id number] DOB:  1971/06/12 Patient phone:  [phone] (home)  Patient address:   [address] [place] [address],  Total Time spent with patient: Greater than 30 minutes  Date of Admission:  03/29/2016  Date of Discharge: 04-01-16  Reason for Admission: Suicidal ideations.  Principal Problem: Major depressive disorder, recurrent episodes.  Discharge Diagnoses: Patient Active Problem List   Diagnosis Date Noted  . MDD (major depressive disorder) ([place]) [F32.9] 03/29/2016    Priority: High  . Suicidal ideations [R45.851] 03/30/2016  . Breast pain [N64.4] 01/18/2016  . 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 Chronic urticaria [L50.8] 08/06/2015  . Paresthesia [R20.2] 04/15/2015  . Headache [R51] 02/20/2015  . 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 Right shoulder pain [M25.511] 08/22/2014  . DUB (dysfunctional uterine bleeding) [N93.8] 03/11/2014  . Breast cancer screening [Z12.39] 10/10/2012  . Screening for cervical cancer [Z12.4] 05/03/2012  . H/O rotator cuff surgery [Z98.890] 01/02/2012  . 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 H/O: C-section [id number] 01/02/2012  . Iron deficiency anemia [D50.9] 01/02/2012  . H/O tubal ligation [Z98.51] 01/02/2012  . Depression [F32.9] 01/02/2012   Past Psychiatric History: Major depressive disorder, recurrent episodes.  Past Medical History:  Past Medical History:  Diagnosis Date  . 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 Depression   . H/O rotator cuff surgery 01/02/2012  . H/O tubal ligation 01/02/2012  . H/O: C-section 01/02/2012   3   . H/O: C-section 01/02/2012   3   . Headache(784.0) 01/02/2012  . Iron deficiency anemia 01/02/2012  . 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 CESAREAN SECTION     3 previous c sections   . NOVASURE ABLATION N/A 03/11/2014   Procedure: NOVASURE ABLATION;  Surgeon: Emily Filbert, MD;  Location: [facility];  Service: Gynecology;  Laterality: N/A;  . rotator cuff surgery    . SHOULDER SURGERY    . TUBAL LIGATION     Family History:  Family History  Problem Relation Age of Onset  . Diabetes Maternal Grandmother   . Diabetes Paternal Grandmother   . Diabetes Paternal Grandfather    Family Psychiatric  History: See H&P  Social History:  History  Alcohol Use  . 8.4 oz/week  . 14 Glasses of wine per week    Comment: weekends     History  Drug Use No    Social History   Social History  . Marital status: Single    Spouse name: N/A  . Number of children: 2  . Years of education: 12   Occupational History  . unemployed    Social History Main Topics  . Smoking status: Former Research scientist (life sciences)  . Smokeless tobacco: Never Used  . Alcohol use 8.4 oz/week    14 Glasses of wine per week     Comment: weekends  . Drug use: No  . Sexual activity: Not Currently    Birth control/ protection: Surgical   Other Topics Concern  . None   Social History Narrative   Patient does not drink caffeine.   Patient is right handed.    Hospital Course: 45 year old female, presented to ED due to depression, feeling severely stressed and overwhelmed. She developed a severe headache, and initially went to ED for headache , but also reported  worsening mood symptoms. At this time denies suicidal ideations but in ED reported she was having visions of herself in a casket. Of note, states her headache is much better today. She states she has been under a lot of stress recently - states that she had been working at a [employer] and the man who employed her closed the business and left without paying her. States she has been facing significant financial stressors as a result of this .  Annette Chang was admitted to the hospital with her UDS test reports showing positive THC & Cocaine. However, her  reason for admission was worsening symptoms of depression requiring mood stabilization treatment. She reported on admission how she was envisioning herself in a casket. After evaluation of her symptoms, Annette Chang was started on medication regimen for her presenting symptoms. Her medication regimen included; Hydroxyzine 25 mg prn for anxiety & Mirtazapine 7.5 mg for insomnia. She was enrolled & participated in the group counseling sessions being offered and held on this unit, she learned coping skills that should help him cope better and maintain mood stability after discharge. She presented no other significant pre-existing health issues that required treatment and or monitoring. Annette Chang tolerated her treatment regimen without any significant adverse effects and or reactions reported.  During the course of her treatment, Annette Chang's symptoms were evaluated on daily basis by a clinical provider to ascertain her symptoms are responding to her treatment regimen. This is evidenced by her reports of decreasing symptoms, improved mood & presentation of good affect. She is currently being discharged to continue psychiatric treatment & medication on outpatient basis as noted below. She is provided with all the pertinent information required to make this appointment without problems.   On this day of hospital discharge, Annette Chang is in much improved condition than upon admission. She contracted for her safety & felt more in control of her mood. Her symptoms were reported as significantly decreased or resolved completely. Upon discharge, she denied SI/HI and voiced no AVH. She is instructed & motivated to continue taking medications with a goal of continued improvement in mental health. She was picked up by her husband. She left [facility] in no apparent distress with all belongings.  Physical Findings: AIMS: Facial and Oral Movements Muscles of Facial Expression: None, normal Lips and Perioral Area: None, normal Jaw: None,  normal Tongue: None, normal,Extremity Movements Upper (arms, wrists, hands, fingers): None, normal Lower (legs, knees, ankles, toes): None, normal, Trunk Movements Neck, shoulders, hips: None, normal, Overall Severity Severity of abnormal movements (highest score from questions above): None, normal Incapacitation due to abnormal movements: None, normal Patient's awareness of abnormal movements (rate only patient's report): No Awareness, Dental Status Current problems with teeth and/or dentures?: No Does patient usually wear dentures?: No  CIWA:    COWS:     Musculoskeletal: Strength & Muscle Tone: within normal limits Gait & Station: normal Patient leans: N/A  Psychiatric Specialty Exam: Physical Exam  Constitutional: She appears well-developed.  HENT:  Head: Normocephalic.  Eyes: Pupils are equal, round, and reactive to light.  Neck: Normal range of motion.  Cardiovascular: Normal rate.   Respiratory: Effort normal.  GI: Soft.  Genitourinary:  Genitourinary Comments: Denies any issues in this area  Musculoskeletal: Normal range of motion.  Neurological: She is alert.  Skin: Skin is warm and dry.    Review of Systems  Constitutional: Negative.   HENT: Negative.   Eyes: Negative.   Respiratory: Negative.   Cardiovascular: Negative.   Gastrointestinal: Negative.  Genitourinary: Negative.   Musculoskeletal: Negative.   Skin: Negative.   Neurological: Negative.   Endo/Heme/Allergies: Negative.   Psychiatric/Behavioral: Positive for depression (Stable). Negative for hallucinations, memory loss, substance abuse and suicidal ideas. The patient has insomnia (Stable). The patient is not nervous/anxious.     Blood pressure 118/84, pulse 66, temperature 98.1 F (36.7 C), temperature source Oral, resp. rate 16, height 5\' 1"  (1.549 m), weight 40.8 kg (90 lb), SpO2 100 %.Body mass index is 17.01 kg/m.  See Md's SRA   Have you used any form of tobacco in the last 30 days?  (Cigarettes, Smokeless Tobacco, Cigars, and/or Pipes): No  Has this patient used any form of tobacco in the last 30 days? (Cigarettes, Smokeless Tobacco, Cigars, and/or Pipes): No  Blood Alcohol level:  Lab Results  Component Value Date   ETH <5 0000000   Metabolic Disorder Labs:  Lab Results  Component Value Date   HGBA1C 5.0 08/09/2013   No results found for: PROLACTIN No results found for: CHOL, TRIG, HDL, CHOLHDL, VLDL, LDLCALC  See Psychiatric Specialty Exam and Suicide Risk Assessment completed by Attending Physician prior to discharge.  Discharge destination:  Home  Is patient on multiple antipsychotic therapies at discharge:  No   Has Patient had three or more failed trials of antipsychotic monotherapy by history:  No  Recommended Plan for Multiple Antipsychotic Therapies: NA    Medication List    STOP taking these medications   acetaminophen 325 MG tablet Commonly known as:  TYLENOL   cetirizine 10 MG tablet Commonly known as:  ZYRTEC   gabapentin 100 MG capsule Commonly known as:  NEURONTIN   naproxen 500 MG tablet Commonly known as:  NAPROSYN     TAKE these medications     Indication  hydrOXYzine 25 MG tablet Commonly known as:  ATARAX/VISTARIL Take 1 tablet (25 mg total) by mouth every 6 (six) hours as needed for anxiety.  Indication:  Anxiety   mirtazapine 7.5 MG tablet Commonly known as:  REMERON Take 1 tablet (7.5 mg total) by mouth at bedtime. For sleep  Indication:  Trouble Sleeping      Follow-up Information    [facility] .   Specialty:  Professional Counselor Why:  Please use the walk-in clinic for your initial assessment for medication management and therapy: Monday-Friday 8am-12pm Contact information: [place] [phone]          Follow-up recommendations:  Activity:  As tolerated Diet: As recommended by your primary care doctor. Keep all scheduled follow-up  appointments as recommended.  Comments: Patient is instructed prior to discharge to: Take all medications as prescribed by his/her mental healthcare provider. Report any adverse effects and or reactions from the medicines to his/her outpatient provider promptly. Patient has been instructed & cautioned: To not engage in alcohol and or illegal drug use while on prescription medicines. In the event of worsening symptoms, patient is instructed to call the crisis hotline, 911 and or go to the nearest ED for appropriate evaluation and treatment of symptoms. To follow-up with his/her primary care provider for your other medical issues, concerns and or health care needs.   Signed: Encarnacion Slates, NP, PMHNP, FNP-BC 04/01/2016, 10:21 AM   Patient seen, Suicide Assessment Completed.  Disposition Plan Reviewed

## 2016-05-29 NOTE — Progress Notes (Signed)
Subjective:    Patient ID: Annette Chang , female   DOB: 1971/04/03 , 45 y.o..   MRN: [id number]  HPI  Annette Chang is here for  Chief Complaint  Patient presents with  . Follow-up   Right shoulder numbness:  Patient states that her shoulder is still bothering her. She has numbness that starts in her shoulder and radiates down her arm. Hx of rotator cuff surgery that she reports occurred in her 80's.  She has a hx of right winged scapula. She has been previously seen by neurology and sports medicine. Patient would like another referral to neurology at this time. She saw them in January 2017 but has not been back. Her right shoulder also has pain, mostly when she stirs a pot with her right hand. She does feel like her right arm is weaker than the left.   Review of Systems: Per HPI.   Past Medical History: Patient Active Problem List   Diagnosis Date Noted  . Suicidal ideations 03/30/2016  . MDD (major depressive disorder) 03/29/2016  . Breast pain 01/18/2016  . 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 Chronic urticaria 08/06/2015  . Paresthesia 04/15/2015  . Headache 02/20/2015  . 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 Right shoulder pain 08/22/2014  . DUB (dysfunctional uterine bleeding) 03/11/2014  . H/O rotator cuff surgery 01/02/2012  . 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 Iron deficiency anemia 01/02/2012  . Depression 01/02/2012    Medications: reviewed and updated Current Outpatient Prescriptions  Medication Sig Dispense Refill  . gabapentin (NEURONTIN) 100 MG capsule Take 100 mg by mouth 3 (three) times daily.    . naproxen (NAPROSYN) 500 MG tablet Take 500 mg by mouth 2 (two) times daily as needed.    . hydrOXYzine (ATARAX/VISTARIL) 25 MG tablet Take 1 tablet (25 mg total) by mouth every 6 (six) hours as needed for anxiety. 60 tablet 0  . mirtazapine (REMERON) 7.5 MG tablet Take 1 tablet (7.5 mg total) by mouth at bedtime. For sleep 30  tablet 0   No current facility-administered medications for this visit.     Social Hx:  reports that she has quit smoking. She has never used smokeless tobacco.   Objective:   BP 118/90   Pulse 63   Temp 97.9 F (36.6 C) (Oral)   Ht 5\' 1"  (1.549 m)   Wt 97 lb 6.4 oz (44.2 kg)   BMI 18.40 kg/m  Physical Exam  Gen: NAD, alert, cooperative with exam, thin appearing HEENT: NCAT, PERRL, clear conjunctiva, oropharynx clear, supple neck Cardiac: Regular rate and rhythm, normal S1/S2, no murmur, no edema, capillary refill brisk  Respiratory: Clear to auscultation bilaterally, no wheezes, non-labored breathing Gastrointestinal: soft, non tender, non distended, bowel sounds present Skin: no rashes, normal turgor  Neurological: Alert and oriented x 3, sensation intact throughout, 2+ reflexes in bilateral bicepts, 5/5 grip strength bilaterally  MSK:  Right Shoulder: Inspection revealing right winged scapula posterior to shoulder Palpation is normal with no tenderness over AC joint or bicipital groove. ROM restricted, unable to actively abduct past 90 degrees. Difficulty putting hand behind back due to pain. Normal internal and external rotation.  Rotator cuff strength limited assessment due to pain  Pain with empty can testing Speeds normal  Left Shoulder: Inspection reveals no abnormalities, atrophy or asymmetry. Palpation is normal with no tenderness over AC joint or bicipital groove. ROM is full in all planes. Rotator cuff strength normal throughout.  Psych: good insight, normal mood and affect   Assessment & Plan:  Paresthesia Paresthesia and pain of right shoulder with radiation down arm (C5-T1 distribution) with grossly normal sensation on exam. 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 Had previous EMG showing long thoracic nerve involvement. Patient would like to see neurology again, last saw in January.  -Will place referral  to neurology again -Continue Gabapentin and Tylenol -Would likely benefit from more physical therapy in the future   Smitty Cords, MD [facility], PGY-2

## 2016-05-30 ENCOUNTER — Ambulatory Visit (INDEPENDENT_AMBULATORY_CARE_PROVIDER_SITE_OTHER): Payer: Medicaid Other | Admitting: Family Medicine

## 2016-05-30 ENCOUNTER — Encounter: Payer: Self-pay | Admitting: Family Medicine

## 2016-05-30 VITALS — BP 118/90 | HR 63 | Temp 97.9°F | Ht 61.0 in | Wt 97.4 lb

## 2016-05-30 DIAGNOSIS — R202 Paresthesia of skin: Secondary | ICD-10-CM

## 2016-05-30 DIAGNOSIS — G8929 Other chronic pain: Secondary | ICD-10-CM

## 2016-05-30 DIAGNOSIS — M958 Other specified acquired deformities of musculoskeletal system: Secondary | ICD-10-CM | POA: Diagnosis not present

## 2016-05-30 DIAGNOSIS — M25511 Pain in right shoulder: Secondary | ICD-10-CM

## 2016-05-30 NOTE — Patient Instructions (Signed)
Thank you for coming in today, it was so nice to see you! Today we talked about:    Shoulder numbness and pain: I have placed another referral to neurology for you. Someone will call you to schedule that appointment.   Bring in all your medications or supplements to each appointment for review.   If you have any questions or concerns, please do not hesitate to call the office at [phone]. You can also message me directly via MyChart.   Sincerely,  Smitty Cords, MD

## 2016-05-30 NOTE — Assessment & Plan Note (Signed)
Paresthesia and pain of right shoulder with radiation down arm (C5-T1 distribution) with grossly normal sensation on exam. 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 Had previous EMG showing long thoracic nerve involvement. Patient would like to see neurology again, last saw in January.  -Will place referral to neurology again -Continue Gabapentin and Tylenol -Would likely benefit from more physical therapy in the future

## 2016-06-02 ENCOUNTER — Ambulatory Visit: Payer: Self-pay | Admitting: Obstetrics & Gynecology

## 2016-07-07 ENCOUNTER — Ambulatory Visit: Payer: Self-pay | Admitting: Obstetrics & Gynecology

## 2016-07-12 ENCOUNTER — Ambulatory Visit: Payer: Self-pay | Admitting: Family Medicine

## 2016-07-15 ENCOUNTER — Ambulatory Visit (INDEPENDENT_AMBULATORY_CARE_PROVIDER_SITE_OTHER): Payer: Medicaid Other | Admitting: Obstetrics & Gynecology

## 2016-07-15 VITALS — BP 122/88 | HR 63 | Ht 61.0 in | Wt 92.0 lb

## 2016-07-15 DIAGNOSIS — R6882 Decreased libido: Secondary | ICD-10-CM

## 2016-07-15 DIAGNOSIS — N898 Other specified noninflammatory disorders of vagina: Secondary | ICD-10-CM | POA: Diagnosis not present

## 2016-07-15 NOTE — Progress Notes (Signed)
   Subjective:    Patient ID: Annette Chang, female    DOB: 09-06-1970, 45 y.o.   MRN: [id number]  HPI 45 yo M AA here to discuss her decreased libido. She has sex only about 1 time per month. She is not taking any meds that would affect her libido, she reports that her relationship is ok with her husband. She is being seen at [facility].  She reports some occasional dryness with sex and sometimes uses a lubricant.   Review of Systems She had an ablation several years ago and has not bled since then.    Objective:   Physical Exam Pleasant [id number] Breathing, conversing, and ambulating normally Vulva/vagina- well- estrogenized Discharge may have some yeast (wet prep sent)       Assessment & Plan:  Decreased libido- trial of testosterone ointment 3 times per week Await wet prep RTC 3 months

## 2016-07-16 LAB — WET PREP, GENITAL
TRICH WET PREP: NONE SEEN
WBC, Wet Prep HPF POC: NONE SEEN
YEAST WET PREP: NONE SEEN

## 2016-08-09 ENCOUNTER — Telehealth: Payer: Self-pay | Admitting: Family Medicine

## 2016-08-09 DIAGNOSIS — G589 Mononeuropathy, unspecified: Secondary | ICD-10-CM

## 2016-08-09 DIAGNOSIS — Z0000 Encounter for general adult medical examination without abnormal findings: Secondary | ICD-10-CM

## 2016-08-09 NOTE — Telephone Encounter (Signed)
Needs new referral sent to neurologist.  Was dismissed from [facility].  [facility].  Please advise

## 2016-08-09 NOTE — Telephone Encounter (Signed)
Referral placed.

## 2016-08-11 ENCOUNTER — Ambulatory Visit: Payer: Self-pay

## 2016-08-16 ENCOUNTER — Ambulatory Visit: Payer: Self-pay | Admitting: Family Medicine

## 2016-08-16 ENCOUNTER — Telehealth: Payer: Self-pay | Admitting: Family Medicine

## 2016-08-16 NOTE — Telephone Encounter (Signed)
Patient came in today requesting referral to have mammogram done and new referral to neurologist. Stated she was terminated from Dr Jannifer Franklin office.  Please call patient to follow up : [phone]

## 2016-08-17 NOTE — Telephone Encounter (Signed)
Referral for mammogram and neurology already placed on 08/09/16. Called patient, no answer. Left voicemail.   Smitty Cords, MD [place], PGY-2

## 2016-08-24 ENCOUNTER — Ambulatory Visit (INDEPENDENT_AMBULATORY_CARE_PROVIDER_SITE_OTHER): Payer: Medicaid Other | Admitting: Licensed Clinical Social Worker

## 2016-08-24 ENCOUNTER — Ambulatory Visit (INDEPENDENT_AMBULATORY_CARE_PROVIDER_SITE_OTHER): Payer: Medicaid Other | Admitting: Family Medicine

## 2016-08-24 ENCOUNTER — Encounter: Payer: Self-pay | Admitting: Licensed Clinical Social Worker

## 2016-08-24 VITALS — BP 121/82 | HR 62 | Temp 97.8°F | Ht 61.0 in | Wt 97.8 lb

## 2016-08-24 DIAGNOSIS — F3341 Major depressive disorder, recurrent, in partial remission: Secondary | ICD-10-CM

## 2016-08-24 DIAGNOSIS — M75101 Unspecified rotator cuff tear or rupture of right shoulder, not specified as traumatic: Secondary | ICD-10-CM

## 2016-08-24 DIAGNOSIS — F320 Major depressive disorder, single episode, mild: Secondary | ICD-10-CM

## 2016-08-24 DIAGNOSIS — M25511 Pain in right shoulder: Secondary | ICD-10-CM

## 2016-08-24 DIAGNOSIS — G8929 Other chronic pain: Secondary | ICD-10-CM

## 2016-08-24 MED ORDER — MIRTAZAPINE 7.5 MG PO TABS: 7.5000 mg | ORAL_TABLET | Freq: Every day | ORAL | 3 refills | Status: DC

## 2016-08-24 MED ORDER — HYDROXYZINE HCL 25 MG PO TABS: 25.0000 mg | ORAL_TABLET | Freq: Four times a day (QID) | ORAL | 1 refills | Status: DC | PRN

## 2016-08-24 NOTE — Progress Notes (Signed)
  SUBJECTIVE: Annette Chang is a 46 y.o. female  referred by Dr. Gerlean Ren for:  anxiety and depression. Discussed services offered by [facility], patient appreciative of support offered, verbal consent received.  Pt. reports the following symptoms/concerns depression, fatigue and negative thouhts and crying.:    Duration of problem: September of 2017 Previous treatment/HX: [facility] inpatient Sept 2017 passive SI with no plan, and major depressive disorder.  Per patient no history of therapy in the past.  Assessments administered: PHQ-9 and GAD score of 4 and 5 indication of mild anxiety and depression Depression screen [facility] 2/9 08/24/2016 01/18/2016 08/06/2015  Decreased Interest 1 0 0  Down, Depressed, Hopeless 1 0 0  PHQ - 2 Score 2 0 0  Altered sleeping 0 - -  Tired, decreased energy 1 - -  Change in appetite 0 - -  Feeling bad or failure about yourself  1 - -  Trouble concentrating 0 - -  Moving slowly or fidgety/restless 0 - -  Suicidal thoughts (No Data) - -  PHQ-9 Score 4 - -  Difficult doing work/chores Extremely dIfficult - -  patient indicated sometime on Suicidal thoughts but no thoughts today and no plan, review of safety plan, no prior attempt.  Has support system GAD 7 : Generalized Anxiety Score 08/24/2016  Nervous, Anxious, on Edge 1  Control/stop worrying 1  Worry too much - different things 1  Trouble relaxing 1  Restless 0  Easily annoyed or irritable 1  Afraid - awful might happen 0  Total GAD 7 Score 5  Anxiety Difficulty Not difficult at all   LIFE CONTEXT:  Family & Social: lives with husband of 30 yrs and 16 year old daughter.   School/ Work: unable to wk due to shoulder pain, husband is employed; experiencing financial stressors  Self-Care: no difficulty with sleep,  Life changes: patient is unable to work,  shoulder pain limiting her activity, out of medication for about 1 month What is important to pt (values): patient wants to feel better about herself. THE  FOLLOW WAS DISCUSSED:Current stressors, past, current and new coping skills, treatment plan and goals.    GOALS ADDRESSED:  1. Managing symptoms of depression 2. Getting connected to community services  ASSESSMENT/ RECOMMENDATION :  Pt currently experiencing mild depression. Symptoms exacerbated by psychosocial financial stressors and limitations with her shoulder.   Pt may benefit from and is in agreement to receive further assessment and therapeutic interventions with [facility].  Patient was referred to [facility] when discharged from [facility].  [facility] will assist patient in getting connected to community resources.  Modalities utilized: Solution Focused psycho-education on Behavior Activation, Reflective listening and Relaxed breathing.      Referral to [facility] provider: Beverly Sessions    PLAN: 1. Patient will F/U with LCSW : on Friday after she goes to [facility] 2. LCSW will F/U with patient in next week if she does not call  [phone] 3. Behavioral Health meds: Hydroxyine  4. Patient will work on the following behavioral recommendations: Relaxed breathing and Behavioral Activation 5. Scale of 1-10, how likely are you to follow plan and implement Interventions: [place], LCSW Licensed Clinical Social Worker [place]   [phone] 10:23 AM

## 2016-08-24 NOTE — Assessment & Plan Note (Signed)
Reports right rotator cuff tear, previously evaluated by sports medicine and neurology. Sports Medicine referred to Orthopedics, but she states she never went to their office for further evaluation. Would like orthopedic evaluation to see if there are any other options for her pain. Referral placed to Orthopedics.

## 2016-08-24 NOTE — Patient Instructions (Addendum)
Thank you so much for coming to visit today! I have refilled your Mirtazapine and Remeron.  I have placed a referral to Orthopedics for your right shoulder. We will have you see Behavioral Health today. Please follow up with them. Follow up with your PCP in one month to see how the medication is doing--you may need to be titrated up!  Dr. Gerlean Ren

## 2016-08-24 NOTE — Progress Notes (Signed)
Subjective:     Patient ID: Annette Chang, female   DOB: 1971/03/31, 46 y.o.   MRN: [id number]  HPI Annette Chang is a 46yo female presenting today for depression. History of depression. States she has been discharged from [facility], who has previously been managing her depression. Requests to establish with Behavioral Health here. Was previously taking hydroxyzine 25 mg 1 tablet every 6 hours as needed for anxiety and mirtazapine 7.5 mg 1 tablet at bedtime, however she's been out of these medications for approximately 1 month. Less refilled medications today. States most of her depression centers around her stress concerning medical bills and her right shoulder pain; she is unable to work secondary to her right shoulder pain. She also has a young daughter who is currently in private school and she is struggling to afford tuition. Was previously hospitalized for suicidal ideation September 5 to September 8. Denies suicidal ideations today. Reports her depression seems stable to her, noting is about a 5 out of 10 in severity. Does report feeling lonely and believes this is the cause of most of her depression. Denies problems with sleep or eating. Known THC and Cocaine use.  Review of Systems Per HPI    Objective:   Physical Exam  Constitutional: She appears well-developed and well-nourished. No distress.  Cardiovascular: Normal rate and regular rhythm.   No murmur heard. Abdominal: Soft. She exhibits no distension. There is no tenderness.  Musculoskeletal: She exhibits no edema.  Decreased passive ROM of right shoulder. Winging of right scapula. Decreased strength of right rotator cuff.  Psychiatric: She has a normal mood and affect. Her behavior is normal.      Assessment and Plan:     Right shoulder pain Reports right rotator cuff tear, previously evaluated by sports medicine and neurology. Sports Medicine referred to Orthopedics, but she states she never went to their office for further  evaluation. Would like orthopedic evaluation to see if there are any other options for her pain. Referral placed to Orthopedics.   MDD (major depressive disorder) GAD 7 Score of 5, PHQ9 Score of 4. Denies SI and HI. Hydroxyzine and Mirtazapine refilled. [place] to see today--please see their note. Follow up with PCP in one month or sooner if needed.

## 2016-08-24 NOTE — Assessment & Plan Note (Signed)
GAD 7 Score of 5, PHQ9 Score of 4. Denies SI and HI. Hydroxyzine and Mirtazapine refilled. [facility] to see today--please see their note. Follow up with PCP in one month or sooner if needed.

## 2016-08-31 ENCOUNTER — Telehealth: Payer: Self-pay | Admitting: Licensed Clinical Social Worker

## 2016-08-31 NOTE — Progress Notes (Signed)
Follow up call to patient.  Patient reports she went to [facility] but was unable to see anyone due to going in at 10:00.  Discussed other options and referrals for therapy, patient having goods days and bad days and interventions used during visit with LCSW that are helping with managing her systems.  Patient only wants to go to [facility] at this time.  She continues to utilize relaxed breathing and states this is helping.  Plan:  1. Patient will go to [facility] at 8:00 this week 2. Patient will continue with relaxed breathing and behavioral activation 3. Patient will follow up with LCSW after her appointment 4. LCSW will follow up with patient next week if she does not call.  Casimer Lanius, LCSW Licensed Clinical Social Worker [place]   [phone] 10:46 AM

## 2016-09-08 ENCOUNTER — Telehealth: Payer: Self-pay | Admitting: Licensed Clinical Social Worker

## 2016-09-08 NOTE — Progress Notes (Addendum)
Integrated Care Follow up call.  Patient reports she is doing well. Completed intake appointment with Mr. Monia Pouch at [facility].  She will continue with one on one appointments with him and can also attend group sessions 3 times a week.  Patient is excited about making this step and looking forward to attending the group sessions.  Patient will follow up with LCSW if needed.  Update provided to PCP.   Casimer Lanius, LCSW Licensed Clinical Social Worker [place]   [phone] 11:09 AM

## 2016-09-09 ENCOUNTER — Ambulatory Visit (INDEPENDENT_AMBULATORY_CARE_PROVIDER_SITE_OTHER): Payer: Medicaid Other

## 2016-09-09 ENCOUNTER — Encounter (INDEPENDENT_AMBULATORY_CARE_PROVIDER_SITE_OTHER): Payer: Self-pay | Admitting: Orthopedic Surgery

## 2016-09-09 ENCOUNTER — Ambulatory Visit (INDEPENDENT_AMBULATORY_CARE_PROVIDER_SITE_OTHER): Payer: Medicaid Other | Admitting: Orthopedic Surgery

## 2016-09-09 DIAGNOSIS — M19011 Primary osteoarthritis, right shoulder: Secondary | ICD-10-CM

## 2016-09-10 NOTE — Progress Notes (Signed)
Office Visit Note   Patient: Annette Chang           Date of Birth: 29-Jul-1970           MRN: [id number] Visit Date: 09/09/2016 Requested by: Byron Sandborn, [place] [address] PCP: Carlyle Dolly, MD  Subjective: Chief Complaint  Patient presents with  . Right Shoulder - Pain    HPI patient is a 46 year old female with right shoulder pain of 2 year duration.  Almost are reviewed.  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  She also states that she had open surgery many years ago to get the "bone scraped".  She has seen sports medicine physician and done physical therapy but it is too incapacitating week painful for her.  She cannot braid her child tear.  Difficult for her to raise her arm above shoulder level.  She's had a cervical spine MRI of her neck which was normal.  She is currently taking Neurontin and naproxen for her symptoms.              Review of Systems All systems reviewed are negative as they relate to the chief complaint within the history of present illness.  Patient denies  fevers or chills.    Assessment & Plan: Visit Diagnoses:  1. Arthritis of shoulder region, right     Plan: Impression is right shoulder pain likely from a combination of long thoracic nerve palsy and subsequent scapular winging combined with possible rotator cuff tear.  It's been 2 years since she was diagnosed with a long thoracic nerve palsy and I think it's unlikely that it will recover.  She may need muscle transfer surgery to address that problem.  In regards to the shoulder rotator cuff that we'll need to be evaluated with repeat MRI arthrogram.  She does have significant functional limitation from this scapular winging problem.  I'll see her back after that study.  Follow-Up Instructions: Return for after MRI.   Orders:  Orders Placed This Encounter  Procedures  . XR  Shoulder Right  . DL FLUORO GUIDED NEEDLE PLC ASPIRATION / INJECTTION/LOC  . MR SHOULDER RIGHT W CONTRAST   No orders of the defined types were placed in this encounter.     Procedures: No procedures performed   Clinical Data: No additional findings.  Objective: Vital Signs: There were no vitals taken for this visit.  Physical Exam   Constitutional: Patient appears well-developed HEENT:  Head: Normocephalic Eyes:EOM  show strabismus which is long-standing per patient Neck: Normal range of motion Cardiovascular: Normal rate Pulmonary/chest: Effort normal Neurologic: Patient is alert Skin: Skin is warm Psychiatric: Patient has normal mood and affect    Ortho Exam orthopedic exam demonstrates good cervical spine range of motion 5 out of 5 grip EPL FPL interosseous wrist flexion-extension biceps triceps and deltoid strength.  She has no definite paresthesias C5 T1.  She only has about 70 of active abduction and less than 90 of forward flexion on the right-hand side.  Her rotator cuff strength seems to be pretty reasonable to infraspinatus supraspinatus and subscap muscle testing.  No other masses lymph adenopathy or skin changes noted in the shoulder girdle region.  She has significant scapular winging with forward flexion consistent with her known history of long thoracic nerve palsy  Specialty Comments:  No specialty comments available.  Imaging: No results found.  PMFS History: Patient Active Problem List   Diagnosis Date Noted  . Suicidal ideations 03/30/2016  . MDD (major depressive disorder) 03/29/2016  . Breast pain 01/18/2016  . 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 Chronic urticaria 08/06/2015  . Paresthesia 04/15/2015  . Headache 02/20/2015  . 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 Right shoulder pain 08/22/2014  . DUB (dysfunctional uterine bleeding) 03/11/2014  . H/O rotator cuff surgery 01/02/2012  . Arthritis of shoulder region, right  01/02/2012  . Iron deficiency anemia 01/02/2012  . Depression 01/02/2012   Past Medical History:  Diagnosis Date  . 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 Depression   . H/O rotator cuff surgery 01/02/2012  . H/O tubal ligation 01/02/2012  . H/O: C-section 01/02/2012   3   . H/O: C-section 01/02/2012   3   . Headache(784.0) 01/02/2012  . Iron deficiency anemia 01/02/2012  . 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 Diabetes Maternal Grandmother   . Diabetes Paternal Grandmother   . Diabetes Paternal Grandfather     Past Surgical History:  Procedure Laterality Date  . CESAREAN SECTION     3 previous c sections  . NOVASURE ABLATION N/A 03/11/2014   Procedure: NOVASURE ABLATION;  Surgeon: Emily Filbert, MD;  Location: [facility];  Service: Gynecology;  Laterality: N/A;  . rotator cuff surgery    . SHOULDER SURGERY    . TUBAL LIGATION     Social History   Occupational History  . unemployed    Social History Main Topics  . Smoking status: Former Research scientist (life sciences)  . Smokeless tobacco: Never Used  . Alcohol use 8.4 oz/week    14 Glasses of wine per week     Comment: weekends  . Drug use: No  . Sexual activity: Not Currently    Birth control/ protection: Surgical

## 2016-09-13 ENCOUNTER — Other Ambulatory Visit: Payer: Self-pay | Admitting: Family Medicine

## 2016-09-13 DIAGNOSIS — Z1231 Encounter for screening mammogram for malignant neoplasm of breast: Secondary | ICD-10-CM

## 2016-09-27 ENCOUNTER — Ambulatory Visit: Payer: Self-pay

## 2016-09-29 ENCOUNTER — Ambulatory Visit
Admission: RE | Admit: 2016-09-29 | Discharge: 2016-09-29 | Disposition: A | Discharge status: Home / Self Care | Payer: Medicaid Other | Source: Ambulatory Visit | Attending: Orthopedic Surgery | Admitting: Orthopedic Surgery

## 2016-09-29 DIAGNOSIS — M19011 Primary osteoarthritis, right shoulder: Secondary | ICD-10-CM

## 2016-09-29 MED ORDER — IOPAMIDOL (ISOVUE-M 200) INJECTION 41%
15.0000 mL | Freq: Once | INTRAMUSCULAR | Status: AC
  Administered 2016-09-29: 15 mL via INTRA_ARTICULAR

## 2016-10-26 ENCOUNTER — Ambulatory Visit (INDEPENDENT_AMBULATORY_CARE_PROVIDER_SITE_OTHER): Payer: Medicaid Other | Admitting: Orthopedic Surgery

## 2016-11-16 ENCOUNTER — Encounter (INDEPENDENT_AMBULATORY_CARE_PROVIDER_SITE_OTHER): Payer: Self-pay | Admitting: Orthopedic Surgery

## 2016-11-16 ENCOUNTER — Telehealth (INDEPENDENT_AMBULATORY_CARE_PROVIDER_SITE_OTHER): Payer: Self-pay | Admitting: Orthopedic Surgery

## 2016-11-16 ENCOUNTER — Ambulatory Visit (INDEPENDENT_AMBULATORY_CARE_PROVIDER_SITE_OTHER): Payer: Medicaid Other | Admitting: Orthopedic Surgery

## 2016-11-16 ENCOUNTER — Other Ambulatory Visit (INDEPENDENT_AMBULATORY_CARE_PROVIDER_SITE_OTHER): Payer: Self-pay | Admitting: Orthopedic Surgery

## 2016-11-16 DIAGNOSIS — M2180 Other specified acquired deformities of unspecified limb: Secondary | ICD-10-CM

## 2016-11-16 DIAGNOSIS — S248XXS Injury of other specified nerves of thorax, sequela: Secondary | ICD-10-CM

## 2016-11-16 DIAGNOSIS — M25511 Pain in right shoulder: Secondary | ICD-10-CM

## 2016-11-16 NOTE — Telephone Encounter (Signed)
Left message with pts neighbor to have pt to call me when she gets home. Neighbor stated pt receives all her calls at the neighbors house.

## 2016-11-16 NOTE — Progress Notes (Signed)
Office Visit Note   Patient: Annette Chang           Date of Birth: 27-Jan-1971           MRN: [id number] Visit Date: 11/16/2016 Requested by: Carlyle Dolly, MD [place], [place] [address] PCP: Carlyle Dolly, MD  Subjective: Chief Complaint  Patient presents with  . Right Shoulder - Follow-up    HPI: Annette Chang is a 46 year old patient with right shoulder pain.  Since of [place] she's had an MRI scan.  She has about a 2-3 year history of long-standing thoracic nerve palsy giving her scapular winging.  This is made her shoulder function significantly less functional than he needs to be for activities of daily living.  She has tried rehabilitation but still reports a lot of functional difficulty with everyday activities.  She has not been able to work because of the way the shoulder is.  MRI scan does not show any rotator cuff pathology.              ROS: All systems reviewed are negative as they relate to the chief complaint within the history of present illness.  Patient denies  fevers or chills.   Assessment & Plan: Visit Diagnoses:  1. Traumatic long thoracic nerve palsy, sequela   2. Right shoulder pain, unspecified chronicity     Plan: Impression is scapular winging from serratus anterior palsy.  It is been at least 2 years since this started and no evidence of recovery is present.  She has functional deficiencies in the right shoulder due to lack of stable platform.  I think at this time it is indicated for pectoralis major transfer to the inferior border of the scapula.  Risks and benefits of the surgery discussed included but limited to infection or vessel damage incomplete healing and complete functional recovery of shoulder motion.  Patient understands the risks benefits and wishes to proceed.  All questions answered.  Follow-Up Instructions: No Follow-up on file.   Orders:  No orders of the defined types were placed in this encounter.  No orders of  the defined types were placed in this encounter.     Procedures: No procedures performed   Clinical Data: No additional findings.  Objective: Vital Signs: There were no vitals taken for this visit.  Physical Exam:   Constitutional: Patient appears well-developed HEENT:  Head: Normocephalic Eyes:EOM are normal Neck: Normal range of motion Cardiovascular: Normal rate Pulmonary/chest: Effort normal Neurologic: Patient is alert Skin: Skin is warm Psychiatric: Patient has normal mood and affect    Ortho Exam: Orthopedic exam demonstrates good cervical spine range of motion intact motor sensory function to the right hand.  The patient has significant scapular winging and only about around 90 of forward flexion and abduction.  Her scapular winging gets worse with any type of forward flexion.  Specialty Comments:  No specialty comments available.  Imaging: No results found.   PMFS History: Patient Active Problem List   Diagnosis Date Noted  . Suicidal ideations 03/30/2016  . MDD (major depressive disorder) 03/29/2016  . Breast pain 01/18/2016  . 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 Chronic urticaria 08/06/2015  . Paresthesia 04/15/2015  . Headache 02/20/2015  . 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 Right shoulder pain 08/22/2014  . DUB (dysfunctional uterine bleeding) 03/11/2014  . H/O rotator cuff surgery 01/02/2012  . 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 Iron deficiency anemia 01/02/2012  .  Depression 01/02/2012   Past Medical History:  Diagnosis Date  . 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 Depression   . H/O rotator cuff surgery 01/02/2012  . H/O tubal ligation 01/02/2012  . H/O: C-section 01/02/2012   3   . H/O: C-section 01/02/2012   3   . Headache(784.0) 01/02/2012  . Iron deficiency anemia 01/02/2012  . 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 Diabetes Maternal  Grandmother   . Diabetes Paternal Grandmother   . Diabetes Paternal Grandfather     Past Surgical History:  Procedure Laterality Date  . CESAREAN SECTION     3 previous c sections  . NOVASURE ABLATION N/A 03/11/2014   Procedure: NOVASURE ABLATION;  Surgeon: Emily Filbert, MD;  Location: [facility];  Service: Gynecology;  Laterality: N/A;  . rotator cuff surgery    . SHOULDER SURGERY    . TUBAL LIGATION     Social History   Occupational History  . unemployed    Social History Main Topics  . Smoking status: Former Research scientist (life sciences)  . Smokeless tobacco: Never Used  . Alcohol use 8.4 oz/week    14 Glasses of wine per week     Comment: weekends  . Drug use: No  . Sexual activity: Not Currently    Birth control/ protection: Surgical

## 2016-12-05 ENCOUNTER — Ambulatory Visit: Payer: Self-pay

## 2016-12-05 DIAGNOSIS — Z1231 Encounter for screening mammogram for malignant neoplasm of breast: Secondary | ICD-10-CM | POA: Diagnosis not present

## 2016-12-29 ENCOUNTER — Telehealth (INDEPENDENT_AMBULATORY_CARE_PROVIDER_SITE_OTHER): Payer: Self-pay | Admitting: Orthopedic Surgery

## 2016-12-29 NOTE — Telephone Encounter (Signed)
[place] for letter? Please advise. Thanks.

## 2016-12-29 NOTE — Telephone Encounter (Signed)
Patient called asked if Dr Marlou Sa would write a letter to the property manager stating she is having surgery so that he can lower her locks to the front and back door to her apartment. The letter should be addressed to [place]. The number to contact patient is [phone]

## 2016-12-30 ENCOUNTER — Encounter: Payer: Self-pay | Admitting: Family Medicine

## 2016-12-30 NOTE — Pre-Procedure Instructions (Signed)
    Annette Chang  12/30/2016      [id number], [place] [address] Phone: [phone] Fax: [phone]    Your procedure is scheduled on 01/10/17.  Report to [facility] Admitting at 9 A.M.  Call this number if you have problems the morning of surgery:  [phone]   Remember:  Do not eat food or drink liquids after midnight.  Take these medicines the morning of surgery with A SIP OF WATER --neurontin   Do not wear jewelry, make-up or nail polish.  Do not wear lotions, powders, or perfumes, or deoderant.  Do not shave 48 hours prior to surgery.  Men may shave face and neck.  Do not bring valuables to the hospital.  [facility] is not responsible for any belongings or valuables.  Contacts, dentures or bridgework may not be worn into surgery.  Leave your suitcase in the car.  After surgery it may be brought to your room.  For patients admitted to the hospital, discharge time will be determined by your treatment team.  Patients discharged the day of surgery will not be allowed to drive home.   Name and phone number of your driver:    Special instructions:  Do not take any aspirin,anti-inflammatories,vitamins,or herbal supplements 5-7 days prior to surgery.  Please read over the following fact sheets that you were given.

## 2016-12-30 NOTE — Telephone Encounter (Signed)
Okay for letter but her left arm will be functional.  You may want to phrase the letter in terms of consider lowering the locks.  Thanks

## 2016-12-30 NOTE — Telephone Encounter (Signed)
I have letter completed for patient but I have not been successful at getting through to anyone at [facility]. I cant get an answer to get a fax number. IC patient and LM for her to call me back to see if she had a name/contact person that I could reach out to so I could get letter faxed.

## 2017-01-02 ENCOUNTER — Encounter (HOSPITAL_COMMUNITY): Payer: Self-pay

## 2017-01-02 ENCOUNTER — Encounter (HOSPITAL_COMMUNITY)
Admission: RE | Admit: 2017-01-02 | Discharge: 2017-01-02 | Disposition: A | Discharge status: Home / Self Care | Payer: Medicaid Other | Source: Ambulatory Visit | Attending: Orthopedic Surgery | Admitting: Orthopedic Surgery

## 2017-01-02 DIAGNOSIS — Z01818 Encounter for other preprocedural examination: Secondary | ICD-10-CM | POA: Diagnosis present

## 2017-01-02 HISTORY — DX: Anxiety disorder, unspecified: F41.9

## 2017-01-02 LAB — BASIC METABOLIC PANEL
ANION GAP: 7 (ref 5–15)
BUN: 10 mg/dL (ref 6–20)
CHLORIDE: 105 mmol/L (ref 101–111)
CO2: 22 mmol/L (ref 22–32)
Calcium: 9.2 mg/dL (ref 8.9–10.3)
Creatinine, Ser: 0.63 mg/dL (ref 0.44–1.00)
GFR calc Af Amer: >60 mL/min (ref >60)
Glucose, Bld: 93 mg/dL (ref 65–99)
POTASSIUM: 3.5 mmol/L (ref 3.5–5.1)
SODIUM: 134 mmol/L — AB (ref 135–145)

## 2017-01-02 LAB — CBC
HCT: 38.7 % (ref 36.0–46.0)
HEMOGLOBIN: 13.4 g/dL (ref 12.0–15.0)
MCH: 31.2 pg (ref 26.0–34.0)
MCHC: 34.6 g/dL (ref 30.0–36.0)
MCV: 90.2 fL (ref 78.0–100.0)
PLATELETS: 150 10*3/uL (ref 150–400)
RBC: 4.29 MIL/uL (ref 3.87–5.11)
RDW: 12.8 % (ref 11.5–15.5)
WBC: 4.1 10*3/uL (ref 4.0–10.5)

## 2017-01-02 LAB — HCG, SERUM, QUALITATIVE: PREG SERUM: NEGATIVE

## 2017-01-09 MED ORDER — CEFAZOLIN SODIUM-DEXTROSE 2-4 GM/100ML-% IV SOLN
2.0000 g | INTRAVENOUS | Status: AC
  Administered 2017-01-10 (×2): 2 g via INTRAVENOUS
  Filled 2017-01-09: qty 100

## 2017-01-10 ENCOUNTER — Encounter (HOSPITAL_COMMUNITY): Payer: Self-pay | Admitting: *Deleted

## 2017-01-10 ENCOUNTER — Ambulatory Visit (HOSPITAL_COMMUNITY): Payer: Medicaid Other | Admitting: Certified Registered"

## 2017-01-10 ENCOUNTER — Observation Stay (HOSPITAL_COMMUNITY)
Admission: RE | Admit: 2017-01-10 | Discharge: 2017-01-12 | Disposition: A | Discharge status: Home / Self Care | Payer: Medicaid Other | Source: Ambulatory Visit | Attending: Orthopedic Surgery | Admitting: Orthopedic Surgery

## 2017-01-10 ENCOUNTER — Encounter (HOSPITAL_COMMUNITY)
Admission: RE | Disposition: A | Discharge status: Home / Self Care | Payer: Self-pay | Source: Ambulatory Visit | Attending: Orthopedic Surgery

## 2017-01-10 DIAGNOSIS — Z885 Allergy status to narcotic agent status: Secondary | ICD-10-CM | POA: Insufficient documentation

## 2017-01-10 DIAGNOSIS — M958 Other specified acquired deformities of musculoskeletal system: Secondary | ICD-10-CM | POA: Diagnosis present

## 2017-01-10 DIAGNOSIS — Z888 Allergy status to other drugs, medicaments and biological substances status: Secondary | ICD-10-CM | POA: Diagnosis not present

## 2017-01-10 DIAGNOSIS — F419 Anxiety disorder, unspecified: Secondary | ICD-10-CM | POA: Insufficient documentation

## 2017-01-10 DIAGNOSIS — F17200 Nicotine dependence, unspecified, uncomplicated: Secondary | ICD-10-CM | POA: Insufficient documentation

## 2017-01-10 DIAGNOSIS — M2180 Other specified acquired deformities of unspecified limb: Secondary | ICD-10-CM

## 2017-01-10 DIAGNOSIS — M19011 Primary osteoarthritis, right shoulder: Secondary | ICD-10-CM | POA: Diagnosis not present

## 2017-01-10 DIAGNOSIS — G589 Mononeuropathy, unspecified: Secondary | ICD-10-CM | POA: Diagnosis present

## 2017-01-10 DIAGNOSIS — R51 Headache: Secondary | ICD-10-CM | POA: Diagnosis not present

## 2017-01-10 DIAGNOSIS — Z79899 Other long term (current) drug therapy: Secondary | ICD-10-CM | POA: Insufficient documentation

## 2017-01-10 DIAGNOSIS — F329 Major depressive disorder, single episode, unspecified: Secondary | ICD-10-CM | POA: Diagnosis not present

## 2017-01-10 DIAGNOSIS — G543 Thoracic root disorders, not elsewhere classified: Secondary | ICD-10-CM | POA: Diagnosis not present

## 2017-01-10 DIAGNOSIS — Z833 Family history of diabetes mellitus: Secondary | ICD-10-CM | POA: Insufficient documentation

## 2017-01-10 DIAGNOSIS — M21821 Other specified acquired deformities of right upper arm: Secondary | ICD-10-CM

## 2017-01-10 DIAGNOSIS — Z9851 Tubal ligation status: Secondary | ICD-10-CM | POA: Insufficient documentation

## 2017-01-10 HISTORY — PX: PECTORALIS TENDON REPAIR: SHX6510

## 2017-01-10 HISTORY — PX: MUSCLE REPAIR: SHX867

## 2017-01-10 SURGERY — REPAIR, TENDON, PECTORALIS
Anesthesia: General | Site: Shoulder | Laterality: Right

## 2017-01-10 MED ORDER — PHENOL 1.4 % MT LIQD: 1.0000 | OROMUCOSAL | Status: DC | PRN

## 2017-01-10 MED ORDER — PROPOFOL 10 MG/ML IV BOLUS
INTRAVENOUS | Status: AC
  Filled 2017-01-10: qty 20

## 2017-01-10 MED ORDER — HYDROXYZINE HCL 25 MG PO TABS
25.0000 mg | ORAL_TABLET | Freq: Every day | ORAL | Status: DC | PRN
  Administered 2017-01-10: 25 mg via ORAL
  Filled 2017-01-10: qty 1

## 2017-01-10 MED ORDER — LACTATED RINGERS IV SOLN
INTRAVENOUS | Status: DC | PRN
  Administered 2017-01-10 (×2): via INTRAVENOUS

## 2017-01-10 MED ORDER — MENTHOL 3 MG MT LOZG: 1.0000 | LOZENGE | OROMUCOSAL | Status: DC | PRN

## 2017-01-10 MED ORDER — METOCLOPRAMIDE HCL 5 MG/ML IJ SOLN
5.0000 mg | Freq: Three times a day (TID) | INTRAMUSCULAR | Status: DC | PRN
  Administered 2017-01-10: 10 mg via INTRAVENOUS
  Filled 2017-01-10: qty 2

## 2017-01-10 MED ORDER — ONDANSETRON HCL 4 MG/2ML IJ SOLN
INTRAMUSCULAR | Status: AC
  Filled 2017-01-10: qty 2

## 2017-01-10 MED ORDER — 0.9 % SODIUM CHLORIDE (POUR BTL) OPTIME
TOPICAL | Status: DC | PRN
  Administered 2017-01-10 (×3): 1000 mL

## 2017-01-10 MED ORDER — DEXAMETHASONE SODIUM PHOSPHATE 10 MG/ML IJ SOLN
INTRAMUSCULAR | Status: AC
  Filled 2017-01-10: qty 1

## 2017-01-10 MED ORDER — MORPHINE SULFATE (PF) 4 MG/ML IV SOLN
INTRAVENOUS | Status: AC
  Filled 2017-01-10: qty 1

## 2017-01-10 MED ORDER — ONDANSETRON HCL 4 MG/2ML IJ SOLN
INTRAMUSCULAR | Status: DC | PRN
  Administered 2017-01-10: 4 mg via INTRAVENOUS

## 2017-01-10 MED ORDER — NAPROXEN SODIUM 275 MG PO TABS
275.0000 mg | ORAL_TABLET | Freq: Two times a day (BID) | ORAL | Status: DC | PRN
  Filled 2017-01-10: qty 1

## 2017-01-10 MED ORDER — SCOPOLAMINE 1 MG/3DAYS TD PT72
MEDICATED_PATCH | TRANSDERMAL | Status: AC
  Filled 2017-01-10: qty 1

## 2017-01-10 MED ORDER — HYDROMORPHONE HCL 1 MG/ML IJ SOLN
0.2500 mg | INTRAMUSCULAR | Status: DC | PRN
  Administered 2017-01-10 (×2): 0.25 mg via INTRAVENOUS
  Administered 2017-01-10 (×2): 0.5 mg via INTRAVENOUS

## 2017-01-10 MED ORDER — SUGAMMADEX SODIUM 200 MG/2ML IV SOLN
INTRAVENOUS | Status: DC | PRN
  Administered 2017-01-10: 89 mg via INTRAVENOUS

## 2017-01-10 MED ORDER — MIDAZOLAM HCL 5 MG/5ML IJ SOLN
INTRAMUSCULAR | Status: DC | PRN
  Administered 2017-01-10: 2 mg via INTRAVENOUS

## 2017-01-10 MED ORDER — ROCURONIUM BROMIDE 10 MG/ML (PF) SYRINGE
PREFILLED_SYRINGE | INTRAVENOUS | Status: AC
  Filled 2017-01-10: qty 5

## 2017-01-10 MED ORDER — SUGAMMADEX SODIUM 200 MG/2ML IV SOLN
INTRAVENOUS | Status: AC
  Filled 2017-01-10: qty 2

## 2017-01-10 MED ORDER — HYDROMORPHONE HCL 1 MG/ML IJ SOLN
0.5000 mg | INTRAMUSCULAR | Status: DC | PRN
  Administered 2017-01-10 – 2017-01-11 (×4): 0.5 mg via INTRAVENOUS
  Filled 2017-01-10 (×4): qty 1

## 2017-01-10 MED ORDER — PHENYLEPHRINE 40 MCG/ML (10ML) SYRINGE FOR IV PUSH (FOR BLOOD PRESSURE SUPPORT)
PREFILLED_SYRINGE | INTRAVENOUS | Status: AC
  Filled 2017-01-10: qty 10

## 2017-01-10 MED ORDER — CEFAZOLIN SODIUM 1 G IJ SOLR
INTRAMUSCULAR | Status: AC
  Filled 2017-01-10: qty 20

## 2017-01-10 MED ORDER — MORPHINE SULFATE (PF) 4 MG/ML IV SOLN
INTRAVENOUS | Status: DC | PRN
  Administered 2017-01-10: 4 mg via SUBCUTANEOUS

## 2017-01-10 MED ORDER — VITAMIN B-12 100 MCG PO TABS
50.0000 ug | ORAL_TABLET | Freq: Every day | ORAL | Status: DC
Start: 2017-01-11 — End: 2017-01-12
  Administered 2017-01-11 – 2017-01-12 (×2): 50 ug via ORAL
  Filled 2017-01-10 (×2): qty 1

## 2017-01-10 MED ORDER — PHENYLEPHRINE 40 MCG/ML (10ML) SYRINGE FOR IV PUSH (FOR BLOOD PRESSURE SUPPORT)
PREFILLED_SYRINGE | INTRAVENOUS | Status: DC | PRN
  Administered 2017-01-10: 40 ug via INTRAVENOUS
  Administered 2017-01-10 (×2): 80 ug via INTRAVENOUS
  Administered 2017-01-10: 40 ug via INTRAVENOUS

## 2017-01-10 MED ORDER — MIRTAZAPINE 15 MG PO TABS
7.5000 mg | ORAL_TABLET | Freq: Every evening | ORAL | Status: DC | PRN
  Administered 2017-01-11: 7.5 mg via ORAL
  Filled 2017-01-10 (×2): qty 1

## 2017-01-10 MED ORDER — CLONIDINE HCL (ANALGESIA) 100 MCG/ML EP SOLN
150.0000 ug | EPIDURAL | Status: AC
  Administered 2017-01-10: 50 ug via INTRA_ARTICULAR
  Filled 2017-01-10: qty 1.5

## 2017-01-10 MED ORDER — PROMETHAZINE HCL 25 MG/ML IJ SOLN: 6.2500 mg | INTRAMUSCULAR | Status: DC | PRN

## 2017-01-10 MED ORDER — CHLORHEXIDINE GLUCONATE 4 % EX LIQD: 60.0000 mL | Freq: Once | CUTANEOUS | Status: DC

## 2017-01-10 MED ORDER — ROCURONIUM BROMIDE 10 MG/ML (PF) SYRINGE
PREFILLED_SYRINGE | INTRAVENOUS | Status: DC | PRN
  Administered 2017-01-10: 10 mg via INTRAVENOUS
  Administered 2017-01-10 (×2): 20 mg via INTRAVENOUS
  Administered 2017-01-10: 50 mg via INTRAVENOUS

## 2017-01-10 MED ORDER — ONDANSETRON HCL 4 MG/2ML IJ SOLN
4.0000 mg | Freq: Four times a day (QID) | INTRAMUSCULAR | Status: DC | PRN
  Administered 2017-01-10 – 2017-01-11 (×3): 4 mg via INTRAVENOUS
  Filled 2017-01-10 (×3): qty 2

## 2017-01-10 MED ORDER — MIDAZOLAM HCL 2 MG/2ML IJ SOLN
INTRAMUSCULAR | Status: AC
  Filled 2017-01-10: qty 2

## 2017-01-10 MED ORDER — LIDOCAINE 2% (20 MG/ML) 5 ML SYRINGE
INTRAMUSCULAR | Status: DC | PRN
Start: 2017-01-10 — End: 2017-01-10
  Administered 2017-01-10: 60 mg via INTRAVENOUS

## 2017-01-10 MED ORDER — PROPOFOL 10 MG/ML IV BOLUS
INTRAVENOUS | Status: DC | PRN
  Administered 2017-01-10: 150 mg via INTRAVENOUS

## 2017-01-10 MED ORDER — LIDOCAINE 2% (20 MG/ML) 5 ML SYRINGE
INTRAMUSCULAR | Status: AC
  Filled 2017-01-10: qty 5

## 2017-01-10 MED ORDER — FENTANYL CITRATE (PF) 250 MCG/5ML IJ SOLN
INTRAMUSCULAR | Status: AC
  Filled 2017-01-10: qty 5

## 2017-01-10 MED ORDER — HYDROMORPHONE HCL 1 MG/ML IJ SOLN
INTRAMUSCULAR | Status: AC
  Administered 2017-01-10: 0.5 mg via INTRAVENOUS
  Filled 2017-01-10: qty 0.5

## 2017-01-10 MED ORDER — BUPIVACAINE HCL (PF) 0.25 % IJ SOLN
INTRAMUSCULAR | Status: DC | PRN
  Administered 2017-01-10: 1 mL

## 2017-01-10 MED ORDER — BUPIVACAINE HCL (PF) 0.25 % IJ SOLN
INTRAMUSCULAR | Status: AC
  Filled 2017-01-10: qty 30

## 2017-01-10 MED ORDER — SCOPOLAMINE 1 MG/3DAYS TD PT72
MEDICATED_PATCH | TRANSDERMAL | Status: DC | PRN
  Administered 2017-01-10: 1 via TRANSDERMAL

## 2017-01-10 MED ORDER — HYDROCODONE-ACETAMINOPHEN 10-325 MG PO TABS
1.0000 | ORAL_TABLET | ORAL | Status: DC | PRN
  Administered 2017-01-10 – 2017-01-12 (×8): 1 via ORAL
  Filled 2017-01-10 (×8): qty 1

## 2017-01-10 MED ORDER — METOCLOPRAMIDE HCL 5 MG PO TABS: 5.0000 mg | ORAL_TABLET | Freq: Three times a day (TID) | ORAL | Status: DC | PRN

## 2017-01-10 MED ORDER — BUPIVACAINE-EPINEPHRINE (PF) 0.5% -1:200000 IJ SOLN
INTRAMUSCULAR | Status: AC
  Filled 2017-01-10: qty 1.8

## 2017-01-10 MED ORDER — HYDROMORPHONE HCL 1 MG/ML IJ SOLN
INTRAMUSCULAR | Status: AC
  Administered 2017-01-10: 0.25 mg via INTRAVENOUS
  Filled 2017-01-10: qty 0.5

## 2017-01-10 MED ORDER — FENTANYL CITRATE (PF) 250 MCG/5ML IJ SOLN
INTRAMUSCULAR | Status: DC | PRN
  Administered 2017-01-10 (×5): 50 ug via INTRAVENOUS

## 2017-01-10 MED ORDER — ONDANSETRON HCL 4 MG PO TABS
4.0000 mg | ORAL_TABLET | Freq: Four times a day (QID) | ORAL | Status: DC | PRN
  Administered 2017-01-12: 4 mg via ORAL
  Filled 2017-01-10: qty 1

## 2017-01-10 MED ORDER — ASPIRIN EC 325 MG PO TBEC
325.0000 mg | DELAYED_RELEASE_TABLET | Freq: Every day | ORAL | Status: DC
  Administered 2017-01-10 – 2017-01-12 (×3): 325 mg via ORAL
  Filled 2017-01-10 (×3): qty 1

## 2017-01-10 MED ORDER — ACETAMINOPHEN 325 MG PO TABS: 650.0000 mg | ORAL_TABLET | Freq: Four times a day (QID) | ORAL | Status: DC | PRN

## 2017-01-10 MED ORDER — GABAPENTIN 100 MG PO CAPS
100.0000 mg | ORAL_CAPSULE | Freq: Two times a day (BID) | ORAL | Status: DC
  Administered 2017-01-10 – 2017-01-12 (×4): 100 mg via ORAL
  Filled 2017-01-10 (×4): qty 1

## 2017-01-10 MED ORDER — TRANEXAMIC ACID 1000 MG/10ML IV SOLN
1000.0000 mg | INTRAVENOUS | Status: AC
  Administered 2017-01-10: 1000 mg via INTRAVENOUS
  Filled 2017-01-10: qty 10

## 2017-01-10 MED ORDER — CEFAZOLIN SODIUM-DEXTROSE 1-4 GM/50ML-% IV SOLN
1.0000 g | Freq: Four times a day (QID) | INTRAVENOUS | Status: AC
  Administered 2017-01-10 (×2): 1 g via INTRAVENOUS
  Filled 2017-01-10 (×2): qty 50

## 2017-01-10 MED ORDER — ACETAMINOPHEN 650 MG RE SUPP: 650.0000 mg | Freq: Four times a day (QID) | RECTAL | Status: DC | PRN

## 2017-01-10 MED ORDER — CALCIUM-VITAMIN D-VITAMIN K 500-500-40 MG-UNT-MCG PO CHEW: 1.0000 | CHEWABLE_TABLET | Freq: Every day | ORAL | Status: DC

## 2017-01-10 MED ORDER — DEXAMETHASONE SODIUM PHOSPHATE 10 MG/ML IJ SOLN
INTRAMUSCULAR | Status: DC | PRN
  Administered 2017-01-10: 10 mg via INTRAVENOUS

## 2017-01-10 SURGICAL SUPPLY — 66 items
APL SKNCLS STERI-STRIP NONHPOA (GAUZE/BANDAGES/DRESSINGS) ×1
BENZOIN TINCTURE PRP APPL 2/3 (GAUZE/BANDAGES/DRESSINGS) ×2 IMPLANT
BLADE LONG MED 31X9 (MISCELLANEOUS) ×1 IMPLANT
BLADE SURG 15 STRL LF DISP TIS (BLADE) IMPLANT
BLADE SURG 15 STRL SS (BLADE)
BUR RND FLUTED 2.5 (BURR) ×1 IMPLANT
CLSR STERI-STRIP ANTIMIC 1/2X4 (GAUZE/BANDAGES/DRESSINGS) ×1 IMPLANT
COVER SURGICAL LIGHT HANDLE (MISCELLANEOUS) ×2 IMPLANT
DECANTER SPIKE VIAL GLASS SM (MISCELLANEOUS) IMPLANT
DRAIN PENROSE 1/4X12 LTX STRL (WOUND CARE) ×2 IMPLANT
DRAPE IMP U-DRAPE 54X76 (DRAPES) ×2 IMPLANT
DRAPE INCISE IOBAN 66X45 STRL (DRAPES) ×2 IMPLANT
DRAPE ORTHO SPLIT 77X108 STRL (DRAPES) ×2
DRAPE SURG ORHT 6 SPLT 77X108 (DRAPES) ×1 IMPLANT
DRAPE U-SHAPE 47X51 STRL (DRAPES) ×4 IMPLANT
DRSG AQUACEL AG ADV 3.5X 6 (GAUZE/BANDAGES/DRESSINGS) ×1 IMPLANT
DRSG PAD ABDOMINAL 8X10 ST (GAUZE/BANDAGES/DRESSINGS) ×1 IMPLANT
DURAPREP 26ML APPLICATOR (WOUND CARE) ×2 IMPLANT
ELECT CAUTERY BLADE 6.4 (BLADE) ×3 IMPLANT
ELECT REM PT RETURN 9FT ADLT (ELECTROSURGICAL) ×2
ELECTRODE REM PT RTRN 9FT ADLT (ELECTROSURGICAL) ×1 IMPLANT
GAUZE SPONGE 4X4 12PLY STRL (GAUZE/BANDAGES/DRESSINGS) ×1 IMPLANT
GAUZE XEROFORM 1X8 LF (GAUZE/BANDAGES/DRESSINGS) ×1 IMPLANT
GLOVE BIOGEL PI IND STRL 8 (GLOVE) ×1 IMPLANT
GLOVE BIOGEL PI INDICATOR 8 (GLOVE) ×1
GLOVE SURG ORTHO 8.0 STRL STRW (GLOVE) ×2 IMPLANT
GOWN STRL REUS W/ TWL LRG LVL3 (GOWN DISPOSABLE) ×2 IMPLANT
GOWN STRL REUS W/TWL LRG LVL3 (GOWN DISPOSABLE) ×4
IMPL KIT LG PECW/BUTTON 3PK (Orthopedic Implant) IMPLANT
IMPLANT KIT LG PECW/BUTTON 3PK (Orthopedic Implant) ×2 IMPLANT
KIT BASIN OR (CUSTOM PROCEDURE TRAY) ×2 IMPLANT
KIT ROOM TURNOVER OR (KITS) ×2 IMPLANT
MANIFOLD NEPTUNE II (INSTRUMENTS) ×2 IMPLANT
NDL 1/2 CIR CATGUT .05X1.09 (NEEDLE) IMPLANT
NDL HYPO 25GX1X1/2 BEV (NEEDLE) ×1 IMPLANT
NEEDLE 1/2 CIR CATGUT .05X1.09 (NEEDLE) IMPLANT
NEEDLE HYPO 25GX1X1/2 BEV (NEEDLE) ×2 IMPLANT
NS IRRIG 1000ML POUR BTL (IV SOLUTION) ×2 IMPLANT
PACK SHOULDER (CUSTOM PROCEDURE TRAY) ×2 IMPLANT
PACK UNIVERSAL I (CUSTOM PROCEDURE TRAY) ×2 IMPLANT
PAD ARMBOARD 7.5X6 YLW CONV (MISCELLANEOUS) ×4 IMPLANT
PASSER SUT SWANSON 36MM LOOP (INSTRUMENTS) IMPLANT
PENCIL BUTTON HOLSTER BLD 10FT (ELECTRODE) ×1 IMPLANT
SPONGE LAP 18X18 X RAY DECT (DISPOSABLE) ×2 IMPLANT
SPONGE LAP 4X18 X RAY DECT (DISPOSABLE) ×4 IMPLANT
STAPLER VISISTAT 35W (STAPLE) ×2 IMPLANT
SUCTION FRAZIER HANDLE 10FR (MISCELLANEOUS) ×1
SUCTION TUBE FRAZIER 10FR DISP (MISCELLANEOUS) ×1 IMPLANT
SUT 2 FIBERLOOP 20 STRT BLUE (SUTURE) ×2
SUT FIBERWIRE #2 38 T-5 BLUE (SUTURE) ×10
SUT MNCRL AB 3-0 PS2 18 (SUTURE) ×2 IMPLANT
SUT VIC AB 0 CT1 27 (SUTURE) ×4
SUT VIC AB 0 CT1 27XBRD ANBCTR (SUTURE) ×1 IMPLANT
SUT VIC AB 0 CT1 27XBRD ANTBC (SUTURE) IMPLANT
SUT VIC AB 1 CTX 27 (SUTURE) ×2 IMPLANT
SUT VIC AB 2-0 CT1 27 (SUTURE) ×6
SUT VIC AB 2-0 CT1 TAPERPNT 27 (SUTURE) ×1 IMPLANT
SUTURE 2 FIBERLOOP 20 STRT BLU (SUTURE) IMPLANT
SUTURE FIBERWR #2 38 T-5 BLUE (SUTURE) ×3 IMPLANT
SUTURE TAPE 1.3 40 TPR END (SUTURE) IMPLANT
SUTURETAPE 1.3 40 TPR END (SUTURE) ×2
SYR CONTROL 10ML LL (SYRINGE) ×2 IMPLANT
TOWEL OR 17X24 6PK STRL BLUE (TOWEL DISPOSABLE) ×2 IMPLANT
TOWEL OR 17X26 10 PK STRL BLUE (TOWEL DISPOSABLE) ×2 IMPLANT
TRAY FOLEY W/METER SILVER 16FR (SET/KITS/TRAYS/PACK) IMPLANT
WATER STERILE IRR 1000ML POUR (IV SOLUTION) ×2 IMPLANT

## 2017-01-10 NOTE — Anesthesia Procedure Notes (Signed)
Procedure Name: Intubation Date/Time: 01/10/2017 7:51 AM Performed by: Freddie Breech Pre-anesthesia Checklist: Patient identified, Emergency Drugs available, Suction available and Patient being monitored Patient Re-evaluated:Patient Re-evaluated prior to inductionOxygen Delivery Method: Circle System Utilized Preoxygenation: Pre-oxygenation with 100% oxygen Intubation Type: IV induction Ventilation: Mask ventilation without difficulty Laryngoscope Size: Mac and 3 Grade View: Grade I Tube type: Oral Tube size: 7.0 mm Number of attempts: 1 Airway Equipment and Method: Stylet and Oral airway Placement Confirmation: ETT inserted through vocal cords under direct vision,  positive ETCO2 and breath sounds checked- equal and bilateral Secured at: 21 cm Tube secured with: Tape Dental Injury: Teeth and Oropharynx as per pre-operative assessment

## 2017-01-10 NOTE — Anesthesia Preprocedure Evaluation (Addendum)
Anesthesia Evaluation  Patient identified by MRN, date of birth, ID band Patient awake    Reviewed: Allergy & Precautions, NPO status , Patient's Chart, lab work & pertinent test results  History of Anesthesia Complications Negative for: history of anesthetic complications  Airway Mallampati: I       Dental  (+) Teeth Intact   Pulmonary Current Smoker,    breath sounds clear to auscultation       Cardiovascular negative cardio ROS   Rhythm:Regular Rate:Normal     Neuro/Psych  Neuromuscular disease    GI/Hepatic negative GI ROS, Neg liver ROS,   Endo/Other    Renal/GU negative Renal ROS     Musculoskeletal  (+) Arthritis ,   Abdominal   Peds  Hematology   Anesthesia Other Findings   Reproductive/Obstetrics                            Anesthesia Physical Anesthesia Plan  ASA: II  Anesthesia Plan: General   Post-op Pain Management:    Induction: Intravenous  PONV Risk Score and Plan: 3 and Ondansetron, Dexamethasone, Propofol and Midazolam  Airway Management Planned: Oral ETT  Additional Equipment:   Intra-op Plan:   Post-operative Plan: Extubation in OR  Informed Consent: I have reviewed the patients History and Physical, chart, labs and discussed the procedure including the risks, benefits and alternatives for the proposed anesthesia with the patient or authorized representative who has indicated his/her understanding and acceptance.   Dental advisory given  Plan Discussed with: CRNA  Anesthesia Plan Comments:        Anesthesia Quick Evaluation

## 2017-01-10 NOTE — Anesthesia Postprocedure Evaluation (Signed)
Anesthesia Post Note  Patient: Annette Chang  Procedure(s) Performed: Procedure(s) (LRB): RIGHT PECTORAL MAJOR TO SCAPULA MUSCLE TRANSFER (Right)     Patient location during evaluation: PACU Anesthesia Type: General Level of consciousness: awake and alert Pain management: pain level controlled Vital Signs Assessment: post-procedure vital signs reviewed and stable Respiratory status: spontaneous breathing, nonlabored ventilation and respiratory function stable Cardiovascular status: blood pressure returned to baseline and stable Postop Assessment: no signs of nausea or vomiting Anesthetic complications: no    Last Vitals:  Vitals:   01/10/17 1254 01/10/17 1255  BP: (!) 142/86   Pulse: 74   Resp: 17   Temp:  36.6 C    Last Pain:  Vitals:   01/10/17 1255  TempSrc:   PainSc: Asleep                 Takoda Janowiak,W. EDMOND

## 2017-01-10 NOTE — H&P (Signed)
Annette Chang is an 46 y.o. female.   Chief Complaint: Right shoulder pain HPI: Patient is a 46 year old female with right shoulder pain.  She describes a two-year history of scapular winging and shoulder dysfunction and pain.  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  There is been no spontaneous resolution of this problem.  She presents now for operative management of scapular winging due to serratus anterior dysfunction.  Past Medical History:  Diagnosis Date  . Anxiety   . 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 Depression   . H/O rotator cuff surgery 01/02/2012  . H/O tubal ligation 01/02/2012  . H/O: C-section 01/02/2012   3   . H/O: C-section 01/02/2012   3   . Headache(784.0) 01/02/2012  . Iron deficiency anemia 01/02/2012  . 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 CESAREAN SECTION     3 previous c sections  . NOVASURE ABLATION N/A 03/11/2014   Procedure: NOVASURE ABLATION;  Surgeon: Emily Filbert, MD;  Location: [facility];  Service: Gynecology;  Laterality: N/A;  . rotator cuff surgery    . SHOULDER SURGERY    . TUBAL LIGATION      Family History  Problem Relation Age of Onset  . Diabetes Maternal Grandmother   . Diabetes Paternal Grandmother   . Diabetes Paternal Grandfather    Social History:  reports that she has been smoking.  She has smoked for the past 5.00 years. She has never used smokeless tobacco. She reports that she drinks about 8.4 oz of alcohol per week . She reports that she does not use drugs.  Allergies:  Allergies  Allergen Reactions  . Percocet [Oxycodone-Acetaminophen] Itching  . Robaxin [Methocarbamol]     Rash    Medications Prior to Admission  Medication Sig Dispense Refill  . caffeine 200 MG TABS tablet Take 200 mg by mouth daily.    . Calcium-Vitamin D-Vitamin K 500-500-40 MG-UNT-MCG CHEW  Chew 1 tablet by mouth daily.    Marland Kitchen gabapentin (NEURONTIN) 100 MG capsule Take 100 mg by mouth 2 (two) times daily.     . hydrOXYzine (ATARAX/VISTARIL) 25 MG tablet Take 1 tablet (25 mg total) by mouth every 6 (six) hours as needed for anxiety. (Patient taking differently: Take 25 mg by mouth daily as needed for anxiety. ) 60 tablet 1  . mirtazapine (REMERON) 7.5 MG tablet Take 1 tablet (7.5 mg total) by mouth at bedtime. For sleep (Patient taking differently: Take 7.5 mg by mouth at bedtime as needed (sleep). ) 90 tablet 3  . naproxen sodium (ANAPROX) 220 MG tablet Take 220 mg by mouth 2 (two) times daily as needed (pain).    . vitamin B-12 (CYANOCOBALAMIN) 50 MCG tablet Take 50 mcg by mouth daily.      No results found for this or any previous visit (from the past 48 hour(s)). No results found.  Review of Systems  Musculoskeletal: Positive for joint pain.  All other systems reviewed and are negative.   Blood pressure 131/87, pulse (!) 52, temperature 98 F (36.7 C), temperature source Oral, resp. rate 18, weight 98 lb (44.5 kg), SpO2 100 %. Physical Exam  Constitutional: She appears well-developed.  HENT:  Head: Normocephalic.  Eyes: Pupils are equal, round, and reactive to light.  Neck: Normal range of motion.  Cardiovascular: Normal rate.   Respiratory: Effort  normal.  Neurological: She is alert.  Skin: Skin is warm.  Psychiatric: She has a normal mood and affect.   Examination of the right shoulder demonstrates reasonable rotator cuff strength but significant restriction of forward flexion and abduction due to scapular dyskinesia.  There is scapular winging associated with attempts at forward flexion.  Motor sensory function to the hand is intact.  Neck range of motion is normal.  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  This is affecting her activities of daily living as well as her ability to  hold unemployment.  She has limited forward flexion and significant pain with any attempts at using the shoulder for meaningful work.  Due to the duration of injury in the likelihood that this will not return spontaneously the plan is for pectoralis major tendon transfer to the scapula.  Risks and benefits discussed including but limited to infection or vessel damage incomplete restoration of complete shoulder function as well as potential failure and stretching of the repair.  Patient understands.  All questions answered.  Anderson Malta, MD 01/10/2017, 7:29 AM

## 2017-01-10 NOTE — Brief Op Note (Signed)
01/10/2017  11:55 AM  PATIENT:  Annette Chang  46 y.o. female  PRE-OPERATIVE DIAGNOSIS:  RIGHT SCAPULAR WINGING  POST-OPERATIVE DIAGNOSIS:  RIGHT SCAPULAR WINGING  PROCEDURE:  Procedure(s): RIGHT PECTORAL MAJOR TO SCAPULA MUSCLE TRANSFER  SURGEON:  Surgeon(s): Meredith Pel, MD  ASSISTANT: April green rnfa  ANESTHESIA:   general  EBL: 75 ml    Total I/O In: 1000 [I.V.:1000] Out: 1100 [Urine:1000; Blood:100]  BLOOD ADMINISTERED: none  DRAINS: none   LOCAL MEDICATIONS USED:  Marcaine mso4 clonidine  SPECIMEN:  No Specimen  COUNTS:  YES  TOURNIQUET:  * No tourniquets in log *  DICTATION: .Other Dictation: Dictation Number [id number]  PLAN OF CARE: Admit for overnight observation  PATIENT DISPOSITION:  PACU - hemodynamically stable

## 2017-01-10 NOTE — Transfer of Care (Signed)
Immediate Anesthesia Transfer of Care Note  Patient: Annette Chang  Procedure(s) Performed: Procedure(s): RIGHT PECTORAL MAJOR TO SCAPULA MUSCLE TRANSFER (Right)  Patient Location: PACU  Anesthesia Type:General  Level of Consciousness: drowsy and patient cooperative  Airway & Oxygen Therapy: Patient Spontanous Breathing and Patient connected to face mask oxygen  Post-op Assessment: Report given to RN and Post -op Vital signs reviewed and stable  Post vital signs: Reviewed and stable  Last Vitals:  Vitals:   01/10/17 0545  BP: 131/87  Pulse: (!) 52  Resp: 18  Temp: 36.7 C    Last Pain:  Vitals:   01/10/17 0545  TempSrc: Oral      Patients Stated Pain Goal: 3 (46/04/79 9872)  Complications: No apparent anesthesia complications

## 2017-01-11 ENCOUNTER — Encounter (HOSPITAL_COMMUNITY): Payer: Self-pay | Admitting: Orthopedic Surgery

## 2017-01-11 DIAGNOSIS — M958 Other specified acquired deformities of musculoskeletal system: Secondary | ICD-10-CM | POA: Diagnosis not present

## 2017-01-11 NOTE — Op Note (Signed)
Annette Chang, Annette Chang             ACCOUNT NO.:  [id number]  MEDICAL RECORD NO.:  [id number]  LOCATION:                                FACILITY:  [facility]  PHYSICIAN:  Anderson Malta, M.D.         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  PROCEDURE:  Right pectoralis major sternal head transfer to inferior lateral border of the scapula on the right-hand side.  SURGEON:  Anderson Malta, M.D.  ASSISTANT:  April Green, RNFA.  INDICATIONS:  Annette Chang is a 46 year old patient, 2 year history of right scapular winging, which has been refractory to nonoperative management. She presents now for operative management after explanation of risks and benefits.  PROCEDURE IN DETAIL:  The patient was brought to the operating room where general anesthetic was induced.  Preoperative antibiotics administered.  Time-out was called.  The patient was placed in lateral position with the head in neutral position and the right arm up.  Right arm, hand, axilla prescrubbed with alcohol and Betadine, allowed to air dry, prepped with DuraPrep solution and draped in a sterile manner. Annette Chang was used to cover the entire operative field.  At this time, a time-out was called.  Incision was made from the coracoid process down to the axillary crease.  Skin and subcutaneous tissue were sharply divided.  The cephalic vein was identified and mobilized laterally along the deltoid.  The pectoralis tendon was then identified and its attachment site was visualized on the humerus.  Medially, blunt dissection was performed to separate the clavicular head, which was superior, but inserted anterior and distal on the humerus from the sternal head, which was inferior but attached on the posterior  aspect of the humeral attachment.  Through blunt dissection, the clavicular head was dissected free and then detached from its humeral attachment.  It was tagged with #5 FiberTape for later reattachment using Endobutton. At this time, the sternal head was detached with a flake of bone measuring about 2.5 cm by 4 mm.  This was the attachment of the sternal head.  This was also tagged with a #2 FiberTape suture which was placed through drill holes in the bone fragment.  Blunt mobilization was then done both circumferentially around the sternal head for mobilization. At this time then, attention was then directed posteriorly.  The inferior lateral border of the scapula was identified.  About a 6-cm incision was made.  Skin and subcutaneous tissue were sharply divided. On the superior surface, the latissimus dorsi and teres minor attachments were partially released from the inferior third of the lateral border of the scapula.  The attention was then directed towards the undersurface of the scapula and with great care, part of the serratus anterior attachment was then released.  Using blunt dissection and with the arm abducted, a path was created along the distal aspect of the chest wall to the anterior incision.  A large Kelly clamp was then passed.  Care  was taken at this time to avoid any type of entrapment of the neurovascular structures which were superior to the passage and not on the chest wall.  The chest wall was visualized both from the anterior and posterior incision.  The tendon was then passed with its bone plug and delivered.  The scapula was then reduced and through 4 drill holes placed in the lateral border of the scapula at its inferior third.  The bone plug was then attached with the tendon to the inferior border of the scapula.  A good flush contact against prepared bony surface was achieved.  #2 FiberWire sutures were then placed around the construct through small drill  holes in order to facilitate and maintain reduction. A good tension on the pec tendon was noted.  At this time, thorough irrigation was performed.  The soft tissue was then reapproximated over the construct.  A small bur was used to smooth out any edges of the bone piece distally.  This was closed using #1 Vicryl suture followed by interrupted inverted 0 Vicryl suture, 2-0 Vicryl suture, and 3-0 Monocryl.  It should be noted that the numbing medicine was placed into the incision.  The anterior incision was then checked.  The clavicular head was then placed back into the proximal humeral shaft using Endobutton.  This gave very secure fixation into the previously detached humeral shaft attachment of the sternal head.  Following this, thorough irrigation was performed.  Good tension was observed on the construct. No nerve entrapment was noted.  At this time, incision was closed by reapproximating the deltopectoral split using 0 Vicryl suture, followed by interrupted inverted 2-0 Vicryl suture, and 3-0 Monocryl.  Dressings were applied.  The patient tolerated the procedure well without immediate complication.  Transferred to the recovery room in stable condition.  Shoulder sling was applied.     Anderson Malta, M.D.   ______________________________ Darnell Level. Alphonzo Severance, M.D.    GSD/MEDQ  D:  01/10/2017  T:  01/10/2017  Job:  [id number]

## 2017-01-11 NOTE — Evaluation (Signed)
Occupational Therapy Evaluation Patient Details Name: Annette Chang MRN: [id number] DOB: 02-13-1971 Today's Date: 01/11/2017    History of Present Illness 46 year old female with right shoulder pain. She describes a two-year history of scapular winging and shoulder dysfunction and pain. 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 Underwent surgery involving right pectoralis major sternal head transfer to lateral border of the scaula on 01/10/17. PMH including anxiety, depressiong, arthritis, rotator cuff surgery (2013), and anemia.    Clinical Impression   PTA, pt was living with her husband and daughter and was requiring assistance with UB dressing and bathing due to decreased functional movement of RUE; prior to RUE injury pt was independent. Currently, pt requires Min-Mod A for ADLs and supervision for functional mobility. Provided education on UB dressing, UB bathing, donning/doffing sling, sling wear and positioning, edema management, and RUE exercises including elbow, wrist, and hand; pt demonstrated understanding. Provided handout to increase carry over at home. Recommend dc home once medically stable per physician. Will continue to follow acutely to educate husband/family and increase pt safety and independence with UB ADLs and exercises.     Follow Up Recommendations  Home health OT;Supervision/Assistance - 24 hour    Equipment Recommendations  3 in 1 bedside commode    Recommendations for Other Services       Precautions / Restrictions Precautions Precautions: Fall;Shoulder Type of Shoulder Precautions: No PROM or AROM of shoulder. WFL of elbow, wrist, and hand Shoulder Interventions: Shoulder sling/immobilizer;At all times Precaution Booklet Issued: Yes (comment) Precaution Comments: Provided shoulder handout to provided writting information with adjustments to fit pt situation Required Braces  or Orthoses: Sling Restrictions Weight Bearing Restrictions: Yes RUE Weight Bearing: Non weight bearing      Mobility Bed Mobility Overal bed mobility: Needs Assistance Bed Mobility: Supine to Sit     Supine to sit: Supervision;HOB elevated     General bed mobility comments: Pt with use of bed rails to pull up (using L hand). supervision for safety and Min VCs to assist with sequencing  Transfers Overall transfer level: Modified independent               General transfer comment: increased time    Balance Overall balance assessment: No apparent balance deficits (not formally assessed)                                         ADL either performed or assessed with clinical judgement   ADL Overall ADL's : Needs assistance/impaired Eating/Feeding: Set up;Sitting   Grooming: Set up;Sitting   Upper Body Bathing: Moderate assistance;Sitting Upper Body Bathing Details (indicate cue type and reason): Educated on UB bathing with compensatory techniques to maintain precautions Lower Body Bathing: Minimal assistance;Sit to/from stand   Upper Body Dressing : Moderate assistance;Sitting Upper Body Dressing Details (indicate cue type and reason): Educated on UB dressing with compensatory techniques to maintain precautions. Pt verbalized understanding. Will need further practice. Additionaly education for donning/doffing sling and positioning Lower Body Dressing: Minimal assistance;Sit to/from stand   Toilet Transfer: Doctor, hospital Details (indicate cue type and reason): Simulated to recliner         Functional mobility during ADLs: Supervision/safety;Min guard General ADL Comments: Pt with limited functional performance due to immobilization and pain. Pt motiviated to participate in therapy and learn ADLs.  Vision         Perception     Praxis      Pertinent Vitals/Pain Pain Assessment: 0-10 Pain Score: 9  Pain  Location: R arm Pain Descriptors / Indicators: Constant;Grimacing;Guarding Pain Intervention(s): Monitored during session     Hand Dominance Right   Extremity/Trunk Assessment Upper Extremity Assessment Upper Extremity Assessment: RUE deficits/detail RUE Deficits / Details: No ROM of R shoulder. Pt with WFL of elbow, wrist, and hand.  RUE: Unable to fully assess due to immobilization RUE Sensation:  (Intact) RUE Coordination: decreased fine motor;decreased gross motor   Lower Extremity Assessment Lower Extremity Assessment: Overall WFL for tasks assessed   Cervical / Trunk Assessment Cervical / Trunk Assessment: Normal   Communication Communication Communication: No difficulties   Cognition Arousal/Alertness: Awake/alert Behavior During Therapy: WFL for tasks assessed/performed Overall Cognitive Status: Within Functional Limits for tasks assessed                                     General Comments       Exercises Exercises: Shoulder Shoulder Exercises Elbow Flexion: AROM;Right;10 reps;Seated Elbow Extension: AROM;Right;10 reps;Seated Wrist Flexion: AROM;Right;10 reps;Seated Wrist Extension: AROM;Right;10 reps;Seated Digit Composite Flexion: AROM;Right;10 reps;Seated Composite Extension: AROM;Right;10 reps;Seated Neck Flexion: AROM;5 reps;Seated Neck Extension: AROM;5 reps;Seated Neck Lateral Flexion - Right: AROM;5 reps;Seated Neck Lateral Flexion - Left: AROM;5 reps;Seated   Shoulder Instructions Shoulder Instructions Donning/doffing shirt without moving shoulder: Moderate assistance;Patient able to independently direct caregiver Method for sponge bathing under operated UE: Minimal assistance Donning/doffing sling/immobilizer: Moderate assistance Correct positioning of sling/immobilizer: Moderate assistance ROM for elbow, wrist and digits of operated UE: Patient able to independently direct caregiver;Min-guard Positioning of UE while sleeping:  Minimal assistance    Home Living Family/patient expects to be discharged to:: Private residence Living Arrangements: Spouse/significant other;Children (Daughter) Available Help at Discharge: Family;Available PRN/intermittently Type of Home: Apartment Home Access: Level entry     Home Layout: One level     Bathroom Shower/Tub: Tub/shower unit;Curtain   Biochemist, clinical: Standard     Home Equipment: None          Prior Functioning/Environment Level of Independence: Needs assistance  Gait / Transfers Assistance Needed: No DME for walking; "A little slow, but I'm fine" ADL's / Homemaking Assistance Needed: Daughter helpign with getting dressed and supervised bathing for safety.    Comments: After the nerve palsy         OT Problem List: Decreased strength;Decreased range of motion;Decreased activity tolerance;Decreased safety awareness;Decreased knowledge of use of DME or AE;Decreased knowledge of precautions;Impaired UE functional use;Pain      OT Treatment/Interventions: Self-care/ADL training;Therapeutic exercise;Energy conservation;DME and/or AE instruction;Therapeutic activities;Patient/family education    OT Goals(Current goals can be found in the care plan section) Acute Rehab OT Goals Patient Stated Goal: Return to PLOF OT Goal Formulation: With patient Time For Goal Achievement: 01/25/17 Potential to Achieve Goals: Good ADL Goals Pt Will Perform Upper Body Bathing: with set-up;with supervision;with caregiver independent in assisting;sitting Pt Will Perform Upper Body Dressing: with supervision;with set-up;with caregiver independent in assisting;sitting Pt Will Perform Lower Body Dressing: with set-up;with supervision;sit to/from stand;with caregiver independent in assisting Pt Will Perform Tub/Shower Transfer: Tub transfer;3 in 1;ambulating;with min guard assist Pt/caregiver will Perform Home Exercise Program: Right Upper extremity;With Supervision  OT Frequency:  Min 2X/week   Barriers to D/C: Decreased caregiver support  Pt stating that she is unsure if they can  arrange 24       Co-evaluation              AM-PAC PT "6 Clicks" Daily Activity     Outcome Measure Help from another person eating meals?: None Help from another person taking care of personal grooming?: A Little Help from another person toileting, which includes using toliet, bedpan, or urinal?: A Little Help from another person bathing (including washing, rinsing, drying)?: A Lot Help from another person to put on and taking off regular upper body clothing?: A Lot Help from another person to put on and taking off regular lower body clothing?: A Little 6 Click Score: 17   End of Session Equipment Utilized During Treatment: Other (comment) (Sling) Nurse Communication: Mobility status;Precautions;Weight bearing status  Activity Tolerance: Patient tolerated treatment well;Patient limited by pain Patient left: in chair;with call bell/phone within reach  OT Visit Diagnosis: Pain;Muscle weakness (generalized) (M62.81) Pain - Right/Left: Right Pain - part of body: Shoulder;Arm                Time: 3343-5686 OT Time Calculation (min): 30 min Charges:  OT General Charges $OT Visit: 1 Procedure OT Evaluation $OT Eval Low Complexity: 1 Procedure OT Treatments $Self Care/Home Management : 8-22 mins G-Codes: OT G-codes **NOT FOR INPATIENT CLASS** Functional Assessment Tool Used: Clinical judgement Functional Limitation: Self care Self Care Current Status (H6837): At least 40 percent but less than 60 percent impaired, limited or restricted Self Care Goal Status (G9021): At least 1 percent but less than 20 percent impaired, limited or restricted   [place], OTR/L Acute Rehab Pager: [phone] Office: [place] 01/11/2017, 11:01 AM

## 2017-01-11 NOTE — Progress Notes (Signed)
Pt stable Pain expected Right hand motor sensory intact rad pulse 2/4 Plan ot today Dc am

## 2017-01-11 NOTE — Plan of Care (Signed)
Problem: Pain Managment: Goal: General experience of comfort will improve Educated patient on the use on pain scale and calling for medications. Patient voices understanding.

## 2017-01-12 ENCOUNTER — Telehealth (INDEPENDENT_AMBULATORY_CARE_PROVIDER_SITE_OTHER): Payer: Self-pay | Admitting: Orthopedic Surgery

## 2017-01-12 DIAGNOSIS — G589 Mononeuropathy, unspecified: Secondary | ICD-10-CM | POA: Diagnosis not present

## 2017-01-12 DIAGNOSIS — M958 Other specified acquired deformities of musculoskeletal system: Secondary | ICD-10-CM | POA: Diagnosis not present

## 2017-01-12 MED ORDER — HYDROCODONE-ACETAMINOPHEN 10-325 MG PO TABS: 1.0000 | ORAL_TABLET | ORAL | 0 refills | Status: DC | PRN

## 2017-01-12 MED ORDER — ONDANSETRON HCL 4 MG PO TABS: 4.0000 mg | ORAL_TABLET | Freq: Four times a day (QID) | ORAL | 0 refills | Status: DC | PRN

## 2017-01-12 NOTE — Care Management Note (Signed)
Case Management Note  Patient Details  Name: HAIVYN ORAVEC MRN: [id number] Date of Birth: Jul 30, 1970  Subjective/Objective:                    Action/Plan:   Expected Discharge Date:  01/12/17               Expected Discharge Plan:  Home/Self Care  In-House Referral:     Discharge planning Services  CM Consult  Post Acute Care Choice:  Durable Medical Equipment Choice offered to:     DME Arranged:  3-N-1 DME Agency:  [place]:    [facility] Agency:     Status of Service:  Completed, signed off  If discussed at [place] of Stay Meetings, dates discussed:    Additional Comments:  Marilu Favre, RN 01/12/2017, 11:46 AM

## 2017-01-12 NOTE — Progress Notes (Signed)
Pt stable Motor sensory function to hand ok on right Plan dc today - ot went well

## 2017-01-12 NOTE — Telephone Encounter (Signed)
IC s/w pharmacist. Insurance will cover 7 day supply, I will work on PA for medication

## 2017-01-12 NOTE — Plan of Care (Signed)
Problem: Safety: Goal: Ability to remain free from injury will improve Outcome: Progressing No falls during this admission. Call bell within reach. Bed in low and locked position. Patient alert and oriented. Clean and clear environment maintained. 3/4 siderails in place. Nonskid footwear being utilized. Patient verbalized understanding of safety instruction.  Problem: Pain Managment: Goal: General experience of comfort will improve Outcome: Progressing Pain is being managed with PO PRN pain medication at this time. Vital signs are stable. No facial grimacing or moaning evident.

## 2017-01-12 NOTE — Plan of Care (Signed)
Problem: Pain Managment: Goal: General experience of comfort will improve Outcome: Progressing Pt's pain is well controlled with prn pain medication.

## 2017-01-12 NOTE — Progress Notes (Signed)
Occupational Therapy Treatment Patient Details Name: Annette Chang MRN: [id number] DOB: 09/23/1970 Today's Date: 01/12/2017    History of present illness 46 year old female with right shoulder pain. She describes a two-year history of scapular winging and shoulder dysfunction and pain. 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 Underwent surgery involving right pectoralis major sternal head transfer to lateral border of the scaula on 01/10/17. PMH including anxiety, depressiong, arthritis, rotator cuff surgery (2013), and anemia.    OT comments  Pt progressing towards established goals. Reviewed UB ADLs, sling wear, positioning, and exercises; pt demonstrated understanding. Pt performed UB dressing with Mod A. Educated husband on UB dressing, sling wear, and use of 3N1 for toilet and tub transfers. Continue recommend dc home with HHOT to increase pt safety and independence with ADLs. Pt planning to dc today; answered all pt and family questions.    Follow Up Recommendations  Home health OT;Supervision/Assistance - 24 hour ; May progress to outpatient per MD.    Equipment Recommendations  3 in 1 bedside commode    Recommendations for Other Services      Precautions / Restrictions Precautions Precautions: Fall;Shoulder Type of Shoulder Precautions: No PROM or AROM of shoulder. WFL of elbow, wrist, and hand Shoulder Interventions: Shoulder sling/immobilizer;At all times Precaution Booklet Issued: Yes (comment) Precaution Comments: Provided shoulder handout to provided writting information with adjustments to fit pt situation Required Braces or Orthoses: Sling Restrictions Weight Bearing Restrictions: Yes RUE Weight Bearing: Non weight bearing       Mobility Bed Mobility Overal bed mobility: Needs Assistance Bed Mobility: Supine to Sit;Sit to Supine     Supine to sit: Supervision;HOB  elevated Sit to supine: Supervision;HOB elevated   General bed mobility comments: Pt with use of bed rails to pull up (using L hand). supervision for safety and Min VCs to assist with sequencing  Transfers Overall transfer level: Modified independent               General transfer comment: increased time    Balance Overall balance assessment: No apparent balance deficits (not formally assessed)                                         ADL either performed or assessed with clinical judgement   ADL Overall ADL's : Needs assistance/impaired           Upper Body Bathing Details (indicate cue type and reason): Reviewed UB bathing with compensatory techniques to maintain precautions     Upper Body Dressing : Moderate assistance;Sitting Upper Body Dressing Details (indicate cue type and reason): Educated husband on UB dressing and using compensatory techniques to maintain precautions. Pt donned night gown with Mod A to manage RUE. Pt able to verbalize technique and able to verbalize sequencing to husband.  Also, educated husband for donning/doffing sling and positioning - wife able to correct him for proper positioning Lower Body Dressing: With caregiver independent assisting;Sit to/from stand Lower Body Dressing Details (indicate cue type and reason): Husband A with donning/doffing socks and underwear   Toilet Transfer Details (indicate cue type and reason): Educated husband on using 3N1       Tub/Shower Transfer Details (indicate cue type and reason): Educated pt on 3N1 for tub seat Functional mobility during ADLs: Min guard;Supervision/safety General ADL Comments: Reviewed all education for UB ADLs,  sling positioning, sling donning/doffing, excercises, and precautions     Vision       Perception     Praxis      Cognition Arousal/Alertness: Awake/alert Behavior During Therapy: WFL for tasks assessed/performed Overall Cognitive Status: Within  Functional Limits for tasks assessed                                          Exercises Exercises: Shoulder Shoulder Exercises Elbow Flexion: AROM;Right;10 reps;Seated Elbow Extension: AROM;Right;10 reps;Seated Wrist Flexion: AROM;Right;10 reps;Seated Wrist Extension: AROM;Right;10 reps;Seated Digit Composite Flexion: AROM;Right;10 reps;Seated Composite Extension: AROM;Right;10 reps;Seated Neck Flexion: AROM;5 reps;Seated Neck Extension: AROM;5 reps;Seated Neck Lateral Flexion - Right: AROM;5 reps;Seated Neck Lateral Flexion - Left: AROM;5 reps;Seated   Shoulder Instructions Shoulder Instructions Donning/doffing shirt without moving shoulder: Moderate assistance;Patient able to independently direct caregiver Method for sponge bathing under operated UE: Minimal assistance Donning/doffing sling/immobilizer: Moderate assistance Correct positioning of sling/immobilizer: Moderate assistance ROM for elbow, wrist and digits of operated UE: Patient able to independently direct caregiver;Min-guard Positioning of UE while sleeping: Minimal assistance     General Comments Husband and best friend present for session    Pertinent Vitals/ Pain       Pain Assessment: Faces Faces Pain Scale: Hurts even more Pain Location: R arm Pain Descriptors / Indicators: Constant;Grimacing;Guarding Pain Intervention(s): Monitored during session;Repositioned;Ice applied  Home Living                                          Prior Functioning/Environment              Frequency  Min 2X/week        Progress Toward Goals  OT Goals(current goals can now be found in the care plan section)  Progress towards OT goals: Progressing toward goals  Acute Rehab OT Goals Patient Stated Goal: Return to PLOF OT Goal Formulation: With patient Time For Goal Achievement: 01/25/17 Potential to Achieve Goals: Good ADL Goals Pt Will Perform Upper Body Bathing: with  set-up;with supervision;with caregiver independent in assisting;sitting Pt Will Perform Upper Body Dressing: with supervision;with set-up;with caregiver independent in assisting;sitting Pt Will Perform Lower Body Dressing: with set-up;with supervision;sit to/from stand;with caregiver independent in assisting Pt Will Perform Tub/Shower Transfer: Tub transfer;3 in 1;ambulating;with min guard assist Pt/caregiver will Perform Home Exercise Program: Right Upper extremity;With Supervision  Plan Discharge plan remains appropriate    Co-evaluation                 AM-PAC PT "6 Clicks" Daily Activity     Outcome Measure   Help from another person eating meals?: None Help from another person taking care of personal grooming?: A Little Help from another person toileting, which includes using toliet, bedpan, or urinal?: A Little Help from another person bathing (including washing, rinsing, drying)?: A Lot Help from another person to put on and taking off regular upper body clothing?: A Lot Help from another person to put on and taking off regular lower body clothing?: A Little 6 Click Score: 17    End of Session Equipment Utilized During Treatment: Other (comment) (Sling)  OT Visit Diagnosis: Pain;Muscle weakness (generalized) (M62.81) Pain - Right/Left: Right Pain - part of body: Shoulder;Arm   Activity Tolerance Patient tolerated treatment well;Patient limited by pain   Patient  Left in chair;with call bell/phone within reach   Nurse Communication Mobility status;Precautions;Weight bearing status    Functional Assessment Tool Used: Clinical judgement Functional Limitation: Self care Self Care Current Status (V2023): At least 20 percent but less than 40 percent impaired, limited or restricted Self Care Goal Status (X4356): At least 1 percent but less than 20 percent impaired, limited or restricted   Time: 8616-8372 OT Time Calculation (min): 22 min  Charges: OT G-codes **NOT FOR  INPATIENT CLASS** Functional Assessment Tool Used: Clinical judgement Functional Limitation: Self care Self Care Current Status (B0211): At least 20 percent but less than 40 percent impaired, limited or restricted Self Care Goal Status (D5520): At least 1 percent but less than 20 percent impaired, limited or restricted OT General Charges $OT Visit: 1 Procedure OT Treatments $Self Care/Home Management : 8-22 mins  [place], OTR/L Acute Rehab Pager: [phone] Office: [place] 01/12/2017, 11:33 AM

## 2017-01-12 NOTE — Telephone Encounter (Signed)
Patient called advised the pharmacy need prior auth before they can fill the Rx. Patient said medicaid is not approving filling the Rx. Patient said she has been home 3 hrs without any pain  medication. The number to contact patient is [phone]

## 2017-01-16 ENCOUNTER — Encounter (INDEPENDENT_AMBULATORY_CARE_PROVIDER_SITE_OTHER): Payer: Self-pay | Admitting: Orthopedic Surgery

## 2017-01-16 ENCOUNTER — Ambulatory Visit (INDEPENDENT_AMBULATORY_CARE_PROVIDER_SITE_OTHER): Payer: Medicaid Other | Admitting: Orthopedic Surgery

## 2017-01-16 DIAGNOSIS — M958 Other specified acquired deformities of musculoskeletal system: Secondary | ICD-10-CM

## 2017-01-17 NOTE — Discharge Summary (Signed)
Physician Discharge Summary  Patient ID: [name] MRN: [id number] DOB/AGE: 47-Sep-1972 47 y.o.  Admit date: 01/10/2017 Discharge date: 01/17/2017  Admission Diagnoses:  Active Problems:   Nerve palsy   Discharge Diagnoses:  Same  Surgeries: Procedure(s): RIGHT PECTORAL MAJOR TO SCAPULA MUSCLE TRANSFER on 01/10/2017   Consultants:   Discharged Condition: Stable  Hospital Course: Annette Chang is an 46 y.o. 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  They were brought to the operating room on 01/10/2017 and underwent the above named procedures.  Patient tolerated the procedure well but had the expected amount of postoperative pain.  She was mobilized with physical therapy for internal/external rotation only.  She will follow-up with me in a week for clinical recheck.  Continue with sling immobilization.  Pain medicine and muscle relaxers provided.   Antibiotics given:  Anti-infectives    Start     Dose/Rate Route Frequency Ordered Stop   01/10/17 1600  ceFAZolin (ANCEF) IVPB 1 g/50 mL premix     1 g 100 mL/hr over 30 Minutes Intravenous Every 6 hours 01/10/17 1317 01/10/17 2230   01/10/17 0715  ceFAZolin (ANCEF) IVPB 2g/100 mL premix     2 g 200 mL/hr over 30 Minutes Intravenous On call to O.R. 01/09/17 1334 01/10/17 1145    .  Recent vital signs:  Vitals:   01/11/17 2100 01/12/17 0601  BP: 129/87 113/68  Pulse: 67 61  Resp: 16 16  Temp: 98.9 F (37.2 C) 99 F (37.2 C)    Recent laboratory studies:  Results for orders placed or performed during the hospital encounter of 01/02/17  CBC  Result Value Ref Range   WBC 4.1 4.0 - 10.5 K/uL   RBC 4.29 3.87 - 5.11 MIL/uL   Hemoglobin 13.4 12.0 - 15.0 g/dL   HCT 38.7 36.0 - 46.0 %   MCV 90.2 78.0 - 100.0 fL   MCH 31.2 26.0 - 34.0 pg   MCHC 34.6 30.0 - 36.0 g/dL   RDW 12.8 11.5 - 15.5  %   Platelets 150 150 - 400 K/uL  Basic metabolic panel  Result Value Ref Range   Sodium 134 (L) 135 - 145 mmol/L   Potassium 3.5 3.5 - 5.1 mmol/L   Chloride 105 101 - 111 mmol/L   CO2 22 22 - 32 mmol/L   Glucose, Bld 93 65 - 99 mg/dL   BUN 10 6 - 20 mg/dL   Creatinine, Ser 0.63 0.44 - 1.00 mg/dL   Calcium 9.2 8.9 - 10.3 mg/dL   GFR calc non Af Amer >60 >60 mL/min   GFR calc Af Amer >60 >60 mL/min   Anion gap 7 5 - 15  hCG, serum, qualitative  Result Value Ref Range   Preg, Serum NEGATIVE NEGATIVE    Discharge Medications:   Allergies as of 01/12/2017      Reactions   Percocet [oxycodone-acetaminophen] Itching   Robaxin [methocarbamol]    Rash      Medication List    STOP taking these medications   mirtazapine 7.5 MG tablet Commonly known as:  REMERON     TAKE these medications   caffeine 200 MG Tabs tablet Take 200 mg by mouth daily.   Calcium-Vitamin D-Vitamin K 500-500-40 MG-UNT-MCG Chew Chew 1 tablet by mouth daily.   gabapentin 100 MG capsule Commonly known as:  NEURONTIN Take 100 mg by  mouth 2 (two) times daily.   HYDROcodone-acetaminophen 10-325 MG tablet Commonly known as:  NORCO Take 1 tablet by mouth every 4 (four) hours as needed (breakthrough pain).   hydrOXYzine 25 MG tablet Commonly known as:  ATARAX/VISTARIL Take 1 tablet (25 mg total) by mouth every 6 (six) hours as needed for anxiety. What changed:  when to take this   naproxen sodium 220 MG tablet Commonly known as:  ANAPROX Take 220 mg by mouth 2 (two) times daily as needed (pain).   ondansetron 4 MG tablet Commonly known as:  ZOFRAN Take 1 tablet (4 mg total) by mouth every 6 (six) hours as needed for nausea.   vitamin B-12 50 MCG tablet Commonly known as:  CYANOCOBALAMIN Take 50 mcg by mouth daily.       Diagnostic Studies: No results found.  Disposition: 01-Home or Self Care  Discharge Instructions    Call MD / Call 911    Complete by:  As directed    If you experience  chest pain or shortness of breath, CALL 911 and be transported to the hospital emergency room.  If you develope a fever above 101 F, pus (white drainage) or increased drainage or redness at the wound, or calf pain, call your surgeon's office.   Constipation Prevention    Complete by:  As directed    Drink plenty of fluids.  Prune juice may be helpful.  You may use a stool softener, such as Colace (over the counter) 100 mg twice a day.  Use MiraLax (over the counter) for constipation as needed.   Diet - low sodium heart healthy    Complete by:  As directed    Discharge instructions    Complete by:  As directed    Ok to shower Ok to come out of sling for elbow range of motion No lifting right arm Return next week   Increase activity slowly as tolerated    Complete by:  As directed          Signed: Anderson Malta 01/17/2017, 12:07 PM

## 2017-01-18 NOTE — Progress Notes (Signed)
Post-Op Visit Note   Patient: Annette Chang           Date of Birth: Dec 07, 1970           MRN: [id number] Visit Date: 01/16/2017 PCP: Carlyle Dolly, MD   Assessment & Plan:  Chief Complaint:  Chief Complaint  Patient presents with  . Right Shoulder - Routine Post Op   Visit Diagnoses:  1. Winged scapula of right side     Plan: Per millimeter is a patient who is now about 6 a week out from impact to scapula transfer.  On exam incisions are good.  She has predictable pain and loss of motion.  At this time I like to start her with internal/external rotation.  I don't want to go to fourth external rotation because of that pectoral repair but I think it is stable enough that we can start to get the shoulder moving.  I'll see her back in about 2 weeks for clinical recheck and check on her pain.  She did take magnesium citrate for constipation.  Follow-Up Instructions: Return in about 2 weeks (around 01/30/2017).   Orders:  Orders Placed This Encounter  Procedures  . Ambulatory referral to Physical Therapy   No orders of the defined types were placed in this encounter.   Imaging: No results found.  PMFS History: Patient Active Problem List   Diagnosis Date Noted  . 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 Suicidal ideations 03/30/2016  . MDD (major depressive disorder) 03/29/2016  . Breast pain 01/18/2016  . 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 Chronic urticaria 08/06/2015  . Paresthesia 04/15/2015  . Headache 02/20/2015  . 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 Right shoulder pain 08/22/2014  . DUB (dysfunctional uterine bleeding) 03/11/2014  . H/O rotator cuff surgery 01/02/2012  . 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 Iron deficiency anemia 01/02/2012  . Depression 01/02/2012   Past Medical History:  Diagnosis Date  . Anxiety   . 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 Depression   . H/O rotator cuff surgery 01/02/2012  . H/O tubal  ligation 01/02/2012  . H/O: C-section 01/02/2012   3   . H/O: C-section 01/02/2012   3   . Headache(784.0) 01/02/2012  . Iron deficiency anemia 01/02/2012  . 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 Diabetes Maternal Grandmother   . Diabetes Paternal Grandmother   . Diabetes Paternal Grandfather     Past Surgical History:  Procedure Laterality Date  . CESAREAN SECTION     3 previous c sections  . MUSCLE REPAIR Right 01/10/2017   RIGHT PECTORAL MAJOR TO SCAPULA MUSCLE TRANSFER (Right  . NOVASURE ABLATION N/A 03/11/2014   Procedure: NOVASURE ABLATION;  Surgeon: Emily Filbert, MD;  Location: [facility];  Service: Gynecology;  Laterality: N/A;  . PECTORALIS TENDON REPAIR Right 01/10/2017   Procedure: RIGHT PECTORAL MAJOR TO SCAPULA MUSCLE TRANSFER;  Surgeon: Meredith Pel, MD;  Location: [facility];  Service: Orthopedics;  Laterality: Right;  . rotator cuff surgery    . SHOULDER SURGERY    . TUBAL LIGATION     Social History   Occupational History  . unemployed    Social History Main Topics  . Smoking status: Current Some Day Smoker    Years: 5.00    Types: Cigarettes  . Smokeless tobacco: Never Used  . Alcohol use 8.4 oz/week  14 Glasses of wine per week     Comment: weekends  . Drug use: Yes     Comment: pt stated no,past lab was + for pot,cocaine  . Sexual activity: Not Currently    Birth control/ protection: Surgical

## 2017-02-01 ENCOUNTER — Ambulatory Visit (INDEPENDENT_AMBULATORY_CARE_PROVIDER_SITE_OTHER): Payer: Self-pay | Admitting: Family Medicine

## 2017-02-01 ENCOUNTER — Encounter: Payer: Self-pay | Admitting: Family Medicine

## 2017-02-01 ENCOUNTER — Encounter: Payer: Self-pay | Admitting: Licensed Clinical Social Worker

## 2017-02-01 DIAGNOSIS — M958 Other specified acquired deformities of musculoskeletal system: Secondary | ICD-10-CM

## 2017-02-01 NOTE — Progress Notes (Signed)
   Subjective:    Patient ID: Annette Chang , female   DOB: April 05, 1971 , 46 y.o..   MRN: [id number]  HPI  Khamila L Zendejas is here for:  1. Follow up after surgery: Patient had a right pectoral major to scapula muscle transfer for management of scapular winging due to serratus anterior dysfunction on 6/19, she was discharged on 6/21. Patient notes that since the surgery she has been feeling okay. She continues to wear a sling on her right arm. She still has 2 healing incisions on her right shoulder. Denies any fevers, chills, poor appetite, nausea, vomiting, diarrhea.  Review of Systems: Per HPI.   Past Medical History: Patient Active Problem List   Diagnosis Date Noted  . 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 Suicidal ideations 03/30/2016  . MDD (major depressive disorder) 03/29/2016  . Breast pain 01/18/2016  . 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 Chronic urticaria 08/06/2015  . Paresthesia 04/15/2015  . Headache 02/20/2015  . 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 Right shoulder pain 08/22/2014  . DUB (dysfunctional uterine bleeding) 03/11/2014  . H/O rotator cuff surgery 01/02/2012  . 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 Iron deficiency anemia 01/02/2012  . Depression 01/02/2012    Medications: reviewed   Social Hx:  reports that she has been smoking Cigarettes.  She has smoked for the past 5.00 years. She has never used smokeless tobacco.   Objective:   BP 128/82   Pulse 65   Temp 98.1 F (36.7 C) (Oral)   Ht 5\' 1"  (1.549 m)   Wt 99 lb 3.2 oz (45 kg)   SpO2 99%   BMI 18.74 kg/m  Physical Exam  Gen: NAD, alert, cooperative with exam, well-appearing Right shoulder: Right arm placed in sling. 2 well-healing incisions on the anterior and posterior aspect of the right shoulder, no surrounding erythema or fluctuance. No drainage from the incisions.  Assessment & Plan:  Winged scapula of right side Status post surgery for scapular winging. Patient doing  well after surgery. Encouraged her to follow-up with her orthopedic surgeon when appropriate. We'll follow up for other chronic conditions as needed.   Smitty Cords, MD [place], PGY-3

## 2017-02-01 NOTE — Progress Notes (Signed)
Patient in to see PCP and requested to meet with LCSW, she is looking for resources that will assist with paying her electric bill. Patient reports she is doing well. Continues to take psychotropic medication and implementing relaxed breathing.  She has discontinued therapy at [facility]. LCSW discussed with patient how bills were paid in the past and reviewed community resources options.  Patient appreciative of information and resources.   LIFE CONTEXT:  Family & Social: lives with her 24 year old daughter, has family that is Occupational hygienist Work: unable to work, lives in [place] has 0 rent  Life changes: recent surgery unable to work.  Intervention: Solution-Focused Strategies, Supportive Counseling, Link to [employer], Reflective listening,     PLAN:  1. Patient will connect with resources provided and contact family/ friends for assistance with her bills 2. Patient will call LCSW as needed.  Casimer Lanius, LCSW Licensed Clinical Social Worker [facility]   [phone] 2:01 PM

## 2017-02-06 ENCOUNTER — Telehealth (INDEPENDENT_AMBULATORY_CARE_PROVIDER_SITE_OTHER): Payer: Self-pay | Admitting: Orthopedic Surgery

## 2017-02-06 MED ORDER — CYCLOBENZAPRINE HCL 5 MG PO TABS: 10.0000 mg | ORAL_TABLET | Freq: Three times a day (TID) | ORAL | 0 refills | Status: DC | PRN

## 2017-02-06 MED ORDER — HYDROCODONE-ACETAMINOPHEN 5-325 MG PO TABS: 1.0000 | ORAL_TABLET | Freq: Three times a day (TID) | ORAL | 0 refills | Status: DC | PRN

## 2017-02-06 NOTE — Telephone Encounter (Signed)
Please advise. Thanks.  

## 2017-02-06 NOTE — Assessment & Plan Note (Signed)
Status post surgery for scapular winging. Patient doing well after surgery. Encouraged her to follow-up with her orthopedic surgeon when appropriate. We'll follow up for other chronic conditions as needed.

## 2017-02-06 NOTE — Telephone Encounter (Signed)
IC advised could pick up at front desk.  

## 2017-02-06 NOTE — Telephone Encounter (Signed)
y

## 2017-02-06 NOTE — Addendum Note (Signed)
Addended byLaurann Montana on: 02/06/2017 03:58 PM   Modules accepted: Orders

## 2017-02-06 NOTE — Telephone Encounter (Signed)
Patient called asking for a refill on muscle relaxers and pain medication. CB # [phone]

## 2017-02-08 ENCOUNTER — Telehealth (INDEPENDENT_AMBULATORY_CARE_PROVIDER_SITE_OTHER): Payer: Self-pay | Admitting: Orthopedic Surgery

## 2017-02-08 NOTE — Telephone Encounter (Signed)
Patient called asking for a new sling, the one she has is falling apart. CB # [phone]

## 2017-02-08 NOTE — Telephone Encounter (Signed)
Tried calling patient. LM for her advising needs to come in to be fitted.  She needs to make nurse only appt to be fitted for sling. Please make sure that someone is aware if patient is put on nurse schedule for sling.

## 2017-02-22 ENCOUNTER — Ambulatory Visit: Payer: Self-pay | Admitting: Occupational Therapy

## 2017-03-01 ENCOUNTER — Encounter: Payer: Self-pay | Admitting: Physical Therapy

## 2017-03-01 ENCOUNTER — Ambulatory Visit: Payer: Medicaid Other | Attending: Orthopedic Surgery | Admitting: Physical Therapy

## 2017-03-01 DIAGNOSIS — M6281 Muscle weakness (generalized): Secondary | ICD-10-CM | POA: Diagnosis present

## 2017-03-01 DIAGNOSIS — M25611 Stiffness of right shoulder, not elsewhere classified: Secondary | ICD-10-CM

## 2017-03-01 DIAGNOSIS — G8929 Other chronic pain: Secondary | ICD-10-CM

## 2017-03-01 DIAGNOSIS — M25511 Pain in right shoulder: Secondary | ICD-10-CM | POA: Insufficient documentation

## 2017-03-01 DIAGNOSIS — M958 Other specified acquired deformities of musculoskeletal system: Secondary | ICD-10-CM | POA: Diagnosis present

## 2017-03-01 NOTE — Therapy (Signed)
[place], [place], [address] Phone: [phone]   Fax:  [phone]  Physical Therapy Evaluation  Patient Details  Name: Annette Chang MRN: [id number] Date of Birth: March 04, 1971 Referring Provider: Meredith Pel MD  Encounter Date: 03/01/2017      PT End of Session - 03/01/17 1424    Visit Number 1   Number of Visits 4   Date for PT Re-Evaluation 04/26/17   Authorization Type MCD   PT Start Time 1330   PT Stop Time 1416   PT Time Calculation (min) 46 min   Activity Tolerance Patient tolerated treatment well   Behavior During Therapy [facility] for tasks assessed/performed      Past Medical History:  Diagnosis Date  . Anxiety   . 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 Depression   . H/O rotator cuff surgery 01/02/2012  . H/O tubal ligation 01/02/2012  . H/O: C-section 01/02/2012   3   . H/O: C-section 01/02/2012   3   . Headache(784.0) 01/02/2012  . Iron deficiency anemia 01/02/2012  . 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 CESAREAN SECTION     3 previous c sections  . MUSCLE REPAIR Right 01/10/2017   RIGHT PECTORAL MAJOR TO SCAPULA MUSCLE TRANSFER (Right  . NOVASURE ABLATION N/A 03/11/2014   Procedure: NOVASURE ABLATION;  Surgeon: Emily Filbert, MD;  Location: [facility];  Service: Gynecology;  Laterality: N/A;  . PECTORALIS TENDON REPAIR Right 01/10/2017   Procedure: RIGHT PECTORAL MAJOR TO SCAPULA MUSCLE TRANSFER;  Surgeon: Meredith Pel, MD;  Location: [place];  Service: Orthopedics;  Laterality: Right;  . rotator cuff surgery    . SHOULDER SURGERY    . TUBAL LIGATION      There were no vitals filed for this visit.       Subjective Assessment - 03/01/17 1341    Subjective pt is a 46 y.o F s/p pec major tendon transfer to a scapula muscle on 01/09/2017. pt arrived with her sling but wasn't wearing it. She continues  do her exercise at home. she reports continued pain inthe shoulder mostly with alittle referral to the outside of the arm.  some N/T in the fingers since the surgery. Since the surgery she reports pain has gotten alot better.    Limitations Lifting;House hold activities  dressing   How long can you sit comfortably? 15-20 min   How long can you stand comfortably? 15-20 min   How long can you walk comfortably? 15-20 min   Diagnostic tests MRI on 09/29/2016   Patient Stated Goals to get back to using the R arm, decrease pain.    Currently in Pain? Yes   Pain Score 7   last took pain medication at 10am, at worst 8/10   Pain Location Shoulder   Pain Orientation Right   Pain Descriptors / Indicators Aching;Throbbing;Tightness   Pain Type Surgical pain   Pain Radiating Towards N/T in the fingers   Pain Onset More than a month ago   Pain Frequency Constant   Aggravating Factors  lifting, movement, running, joggin   Pain Relieving Factors hot pack, cold, mediation   Effect of Pain on Daily Activities limited mobility, strength,             [facility] PT Assessment - 03/01/17 1325      Assessment   Medical Diagnosis scapular winging  s/p pec major tendon to scapular muscle transfer   Referring Provider Meredith Pel MD   Onset Date/Surgical Date --  surgery on 01/10/2017   Hand Dominance Right   Next MD Visit --  make one after PT has finished   Prior Therapy yes      Precautions   Precaution Comments no lifting, no stretching,     Restrictions   Weight Bearing Restrictions Yes   RUE Weight Bearing Non weight bearing     Balance Screen   Has the patient fallen in the past 6 months No   Has the patient had a decrease in activity level because of a fear of falling?  No   Is the patient reluctant to leave their home because of a fear of falling?  No     Home Environment   Living Environment Private residence   Living Arrangements Spouse/significant other;Children   Available  Help at Discharge Family;Available PRN/intermittently   Type of Home Apartment   Home Access Stairs to enter   Entrance Stairs-Number of Steps 6   Entrance Stairs-Rails Left   Home Layout One level   Home Equipment Toilet riser     Prior Function   Level of Independence Independent with basic ADLs;Other (comment)   Vocation Unemployed   Leisure cook,      Cognition   Overall Cognitive Status Within Functional Limits for tasks assessed     Observation/Other Assessments   Observations incisions are dry, and clean and appear free from infection   Focus on Therapeutic Outcomes (FOTO)  71% limited  predicted 42% limited     Posture/Postural Control   Posture/Postural Control Postural limitations   Postural Limitations Rounded Shoulders;Forward head   Posture Comments bil mild wing scapulas     ROM / Strength   AROM / PROM / Strength AROM;Strength;PROM     AROM   AROM Assessment Site Shoulder   Right/Left Shoulder Right;Left   Right Shoulder Extension 45 Degrees   Right Shoulder Flexion 22 Degrees   Right Shoulder ABduction 34 Degrees   Right Shoulder Internal Rotation 60 Degrees  in neutral, to stomach   Right Shoulder External Rotation 26 Degrees  in neutral     PROM   PROM Assessment Site Shoulder   Right/Left Shoulder Right   Right Shoulder Flexion 68 Degrees   Right Shoulder ABduction 48 Degrees   Right Shoulder Internal Rotation 50 Degrees  45 degrees of abduction   Right Shoulder External Rotation 45 Degrees  45 degrees of abduction     Strength   Strength Assessment Site Shoulder   Right/Left Shoulder Right   Right Shoulder Flexion 3-/5  pain during testing   Right Shoulder Extension 3-/5  pain during testing   Right Shoulder ABduction 3-/5  pain during testing   Right Shoulder Internal Rotation 3-/5  pain during testing   Right Shoulder External Rotation 3-/5  pain during testing     Palpation   Palpation comment TTP upper trap/ levator scap  tightness on the R, R rhomboid spams            Objective measurements completed on examination: See above findings.                  PT Education - 03/01/17 1414    Education provided Yes   Education Details evaluation findings, POC, goals, HEP with proper form, anatomy of area involved, how to don/ doff sling appropriately   Person(s) Educated Patient  Methods Explanation;Verbal cues;Handout   Comprehension Verbalized understanding;Verbal cues required          PT Short Term Goals - 03/01/17 1727      PT SHORT TERM GOAL #1   Title pt to be I with inital HEP    Baseline no previous HEP   Time 3   Period Weeks   Status New   Target Date 03/22/17           PT Long Term Goals - 03/01/17 1727      PT LONG TERM GOAL #1   Title pt to increase R shoulder AROM flexion/ abduction to >/= 90 degrees and IER to >/= 40 degrees with </= 4/10 pain for functional mobility required for ADLs with </= 2/10 pain   Baseline flexion 22 degrees / abduction 34 degrees,  ER 26 degrees   Time 6   Period Weeks   Status New   Target Date 04/12/17     PT LONG TERM GOAL #2   Title increase strength in R shoulder to >/= 3+ with </= 2/10 pain during testing for shoulder stability and assist with lifting and carrying activities    Baseline 3-/5 in all planes   Time 6   Period Weeks   Status New   Target Date 04/12/17     PT LONG TERM GOAL #3   Title pt will increase FOTO score to </= 48% limited to demo improvement in function   Baseline 71% lmited   Time 6   Period Weeks   Status New   Target Date 04/12/17     PT LONG TERM GOAL #4   Title pt will be I with all HEP given as of last visit    Baseline no previous HEP   Time 6   Period Weeks   Status New   Target Date 04/12/17                Plan - 03/01/17 1425    Clinical Impression Statement pt presents to [facility] s/p pec tendon transfer to scapula muscles on 01/09/2017. She demonstrates limited R shoulder  mobility and strength. TTP in R upper trap/ levator scapulae and sub-occipitals. She would benefit from physical therapy to decrease R shoulder pain, improve mobility, increase strength and maximize function by addressing the deficits listed below.   CPT code [id number]   History and Personal Factors relevant to plan of care: hx or pec surgery, rotator cuff surgery, hx of anxiety, hx of nerve disease   Clinical Presentation Evolving   Clinical Presentation due to: limited shoulder mobility, muscle spasm, weakness, abnormal posture   Clinical Decision Making Moderate   Rehab Potential Good   PT Frequency Biweekly   PT Duration 6 weeks   PT Treatment/Interventions ADLs/Self Care Home Management;Electrical Stimulation;Iontophoresis 4mg /ml Dexamethasone;Cryotherapy;Moist Heat;Ultrasound   PT Next Visit Plan review HEP and update PRN, scapular mobs, GHJ mobs, scapular stability exercises, soft tissue work   PT Home Exercise Plan table slides flexion/ abduction, scapular retraction, upper trap stretch,    Consulted and Agree with Plan of Care Patient      Patient will benefit from skilled therapeutic intervention in order to improve the following deficits and impairments:  Pain, Improper body mechanics, Postural dysfunction, Decreased strength, Decreased range of motion, Impaired tone, Decreased endurance, Decreased activity tolerance, Increased fascial restricitons  Visit Diagnosis: Chronic right shoulder pain - Plan: PT plan of care cert/re-cert  Winged scapula of right side - Plan: PT plan of care  cert/re-cert  Muscle weakness (generalized) - Plan: PT plan of care cert/re-cert  Stiffness of right shoulder, not elsewhere classified - Plan: PT plan of care cert/re-cert     Problem List Patient Active Problem List   Diagnosis Date Noted  . 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 Suicidal ideations 03/30/2016  . MDD (major depressive disorder) 03/29/2016  . Breast pain 01/18/2016  . 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 Chronic urticaria 08/06/2015  . Paresthesia 04/15/2015  . Headache 02/20/2015  . 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 Right shoulder pain 08/22/2014  . DUB (dysfunctional uterine bleeding) 03/11/2014  . H/O rotator cuff surgery 01/02/2012  . 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 Iron deficiency anemia 01/02/2012  . Depression 01/02/2012   Starr Lake PT, DPT, LAT, ATC  03/01/17  5:41 PM      [facility] [address] [place], [place], [address] Phone: [phone]   Fax:  [phone]  Name: KARENNA ROMANOFF MRN: [id number] Date of Birth: November 05, 1970

## 2017-03-13 ENCOUNTER — Other Ambulatory Visit: Payer: Self-pay | Admitting: Orthopedic Surgery

## 2017-03-15 ENCOUNTER — Ambulatory Visit: Payer: Medicaid Other | Admitting: Physical Therapy

## 2017-03-15 ENCOUNTER — Other Ambulatory Visit: Payer: Self-pay | Admitting: Neurology

## 2017-03-16 ENCOUNTER — Other Ambulatory Visit: Payer: Self-pay | Admitting: Orthopedic Surgery

## 2017-03-16 NOTE — Telephone Encounter (Signed)
Okay to refill 1 time then the other doctor will have to do it

## 2017-03-16 NOTE — Telephone Encounter (Signed)
Do you want to refill this? Does not look like you initially prescribed for patient. Please advise. Thanks.

## 2017-03-29 ENCOUNTER — Ambulatory Visit: Payer: Medicaid Other | Attending: Orthopedic Surgery | Admitting: Physical Therapy

## 2017-03-29 ENCOUNTER — Encounter: Payer: Self-pay | Admitting: Physical Therapy

## 2017-03-29 DIAGNOSIS — M25511 Pain in right shoulder: Secondary | ICD-10-CM | POA: Insufficient documentation

## 2017-03-29 DIAGNOSIS — M6281 Muscle weakness (generalized): Secondary | ICD-10-CM

## 2017-03-29 DIAGNOSIS — M958 Other specified acquired deformities of musculoskeletal system: Secondary | ICD-10-CM | POA: Insufficient documentation

## 2017-03-29 DIAGNOSIS — R29898 Other symptoms and signs involving the musculoskeletal system: Secondary | ICD-10-CM | POA: Diagnosis present

## 2017-03-29 DIAGNOSIS — M25611 Stiffness of right shoulder, not elsewhere classified: Secondary | ICD-10-CM | POA: Insufficient documentation

## 2017-03-29 DIAGNOSIS — G8929 Other chronic pain: Secondary | ICD-10-CM | POA: Diagnosis present

## 2017-03-29 NOTE — Therapy (Signed)
[place], [place], [address] Phone: [phone]   Fax:  [phone]  Physical Therapy Treatment  Patient Details  Name: Annette Chang MRN: [id number] Date of Birth: 1970/10/08 Referring Provider: Meredith Pel MD  Encounter Date: 03/29/2017      PT End of Session - 03/29/17 1104    Visit Number 2   Number of Visits 4   Date for PT Re-Evaluation 04/26/17   PT Start Time 0950  pt arrived at 9:14 but wasn't checked shown checked in   PT Stop Time 1028  extended to get full session   PT Time Calculation (min) 38 min   Activity Tolerance Patient tolerated treatment well   Behavior During Therapy [facility] for tasks assessed/performed      Past Medical History:  Diagnosis Date  . Anxiety   . 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 Depression   . H/O rotator cuff surgery 01/02/2012  . H/O tubal ligation 01/02/2012  . H/O: C-section 01/02/2012   3   . H/O: C-section 01/02/2012   3   . Headache(784.0) 01/02/2012  . Iron deficiency anemia 01/02/2012  . 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 CESAREAN SECTION     3 previous c sections  . MUSCLE REPAIR Right 01/10/2017   RIGHT PECTORAL MAJOR TO SCAPULA MUSCLE TRANSFER (Right  . NOVASURE ABLATION N/A 03/11/2014   Procedure: NOVASURE ABLATION;  Surgeon: Emily Filbert, MD;  Location: [facility];  Service: Gynecology;  Laterality: N/A;  . PECTORALIS TENDON REPAIR Right 01/10/2017   Procedure: RIGHT PECTORAL MAJOR TO SCAPULA MUSCLE TRANSFER;  Surgeon: Meredith Pel, MD;  Location: [place];  Service: Orthopedics;  Laterality: Right;  . rotator cuff surgery    . SHOULDER SURGERY    . TUBAL LIGATION      There were no vitals filed for this visit.      Subjective Assessment - 03/29/17 0951    Subjective "some pain occasionally, things are getting better. I've been doing the exercising  every day about twice a day"    Currently in Pain? Yes   Pain Score 6    Pain Location Shoulder   Pain Orientation Right   Pain Descriptors / Indicators Sharp;Burning   Pain Type Surgical pain   Pain Onset More than a month ago   Pain Frequency Constant   Aggravating Factors  lifting, movement, running,    Pain Relieving Factors medication                         [facility] Adult PT Treatment/Exercise - 03/29/17 0956      Shoulder Exercises: Seated   Extension 10 reps;Both     Shoulder Exercises: Pulleys   Flexion 2 minutes     Shoulder Exercises: Isometric Strengthening   Flexion 5X5"   Extension 5X5"   External Rotation 5X5"   Internal Rotation 5X5"   ABduction 5X5"   ADduction 5X5"     Manual Therapy   Manual Therapy Passive ROM;Joint mobilization   Manual therapy comments cross friction massage over incision   Joint Mobilization grade 3 in anterior/ posterior with distraction   Passive ROM shoulder in all planes with intermittent distraction                   PT Short Term Goals - 03/29/17 1108  PT SHORT TERM GOAL #1   Title pt to be I with inital HEP    Baseline I with inital HEP   Time 3   Period Weeks   Status Achieved           PT Long Term Goals - 03/01/17 1727      PT LONG TERM GOAL #1   Title pt to increase R shoulder AROM flexion/ abduction to >/= 90 degrees and IER to >/= 40 degrees with </= 4/10 pain for functional mobility required for ADLs with </= 2/10 pain   Baseline flexion 22 degrees / abduction 34 degrees,  ER 26 degrees   Time 6   Period Weeks   Status New   Target Date 04/12/17     PT LONG TERM GOAL #2   Title increase strength in R shoulder to >/= 3+ with </= 2/10 pain during testing for shoulder stability and assist with lifting and carrying activities    Baseline 3-/5 in all planes   Time 6   Period Weeks   Status New   Target Date 04/12/17     PT LONG TERM GOAL #3   Title pt will increase FOTO  score to </= 48% limited to demo improvement in function   Baseline 71% lmited   Time 6   Period Weeks   Status New   Target Date 04/12/17     PT LONG TERM GOAL #4   Title pt will be I with all HEP given as of last visit    Baseline no previous HEP   Time 6   Period Weeks   Status New   Target Date 04/12/17               Plan - 03/29/17 1105    Clinical Impression Statement pt reports she has been consistent with HEP. she demonstrates improvement in shoulder PROM but continues to have soreness around the incision. continued PROM and shoulder mobs to promote mobility. educated and performed desensitization techniques to decrease pain.  began isometrics today which she required cues to ease into. she declined modalities post session.    PT Next Visit Plan review HEP and update PRN, scapular mobs, GHJ mobs, scapular stability exercises, soft tissue work   PT Home Exercise Plan table slides flexion/ abduction, scapular retraction, upper trap stretch,    Consulted and Agree with Plan of Care Patient      Patient will benefit from skilled therapeutic intervention in order to improve the following deficits and impairments:  Pain, Improper body mechanics, Postural dysfunction, Decreased strength, Decreased range of motion, Impaired tone, Decreased endurance, Decreased activity tolerance, Increased fascial restricitons  Visit Diagnosis: Winged scapula of right side  Chronic right shoulder pain  Muscle weakness (generalized)  Stiffness of right shoulder, not elsewhere classified  Weakness of right arm  Pain in joint of right shoulder     Problem List Patient Active Problem List   Diagnosis Date Noted  . 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 Suicidal ideations 03/30/2016  . MDD (major depressive disorder) 03/29/2016  . Breast pain 01/18/2016  . 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 Chronic urticaria 08/06/2015  . Paresthesia 04/15/2015  . Headache 02/20/2015  . Winged scapula  of right side 10/06/2014  . Right shoulder pain 08/22/2014  . DUB (dysfunctional uterine bleeding) 03/11/2014  . H/O rotator cuff surgery 01/02/2012  . 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 Iron deficiency anemia 01/02/2012  . Depression 01/02/2012   Danyka Merlin  Ericka Pontiff, DPT, LAT, ATC  03/29/17  11:11 AM      [facility] [address] [place], [place], [address] Phone: [phone]   Fax:  [phone]  Name: Annette Chang MRN: [id number] Date of Birth: 02/19/1971

## 2017-04-04 ENCOUNTER — Encounter: Payer: Self-pay | Admitting: Physical Therapy

## 2017-04-04 ENCOUNTER — Ambulatory Visit: Payer: Medicaid Other | Admitting: Physical Therapy

## 2017-04-04 DIAGNOSIS — M6281 Muscle weakness (generalized): Secondary | ICD-10-CM

## 2017-04-04 DIAGNOSIS — M25511 Pain in right shoulder: Secondary | ICD-10-CM

## 2017-04-04 DIAGNOSIS — M958 Other specified acquired deformities of musculoskeletal system: Secondary | ICD-10-CM | POA: Diagnosis not present

## 2017-04-04 DIAGNOSIS — G8929 Other chronic pain: Secondary | ICD-10-CM

## 2017-04-04 DIAGNOSIS — M25611 Stiffness of right shoulder, not elsewhere classified: Secondary | ICD-10-CM

## 2017-04-04 DIAGNOSIS — R29898 Other symptoms and signs involving the musculoskeletal system: Secondary | ICD-10-CM

## 2017-04-04 NOTE — Therapy (Addendum)
[place], [place], [address] Phone: [phone]   Fax:  [phone]  Physical Therapy Treatment  Patient Details  Name: Annette Chang MRN: [id number] Date of Birth: 1970-12-02 Referring Provider: Meredith Pel MD  Encounter Date: 04/04/2017      PT End of Session - 04/04/17 1017    Visit Number 3   Number of Visits 4   Date for PT Re-Evaluation 04/26/17   Authorization Type MCD   PT Start Time 0930   PT Stop Time 1020   PT Time Calculation (min) 50 min   Activity Tolerance Patient tolerated treatment well   Behavior During Therapy [facility] for tasks assessed/performed      Past Medical History:  Diagnosis Date  . Anxiety   . 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 Depression   . H/O rotator cuff surgery 01/02/2012  . H/O tubal ligation 01/02/2012  . H/O: C-section 01/02/2012   3   . H/O: C-section 01/02/2012   3   . Headache(784.0) 01/02/2012  . Iron deficiency anemia 01/02/2012  . 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 CESAREAN SECTION     3 previous c sections  . MUSCLE REPAIR Right 01/10/2017   RIGHT PECTORAL MAJOR TO SCAPULA MUSCLE TRANSFER (Right  . NOVASURE ABLATION N/A 03/11/2014   Procedure: NOVASURE ABLATION;  Surgeon: Emily Filbert, MD;  Location: [facility];  Service: Gynecology;  Laterality: N/A;  . PECTORALIS TENDON REPAIR Right 01/10/2017   Procedure: RIGHT PECTORAL MAJOR TO SCAPULA MUSCLE TRANSFER;  Surgeon: Meredith Pel, MD;  Location: [place];  Service: Orthopedics;  Laterality: Right;  . rotator cuff surgery    . SHOULDER SURGERY    . TUBAL LIGATION      There were no vitals filed for this visit.      Subjective Assessment - 04/04/17 0935    Subjective "i am still feeling sore, some days I feel like I am making progress then other days I feel like I am regressing"    Currently in Pain? Yes   Pain Score 6    Pain Orientation Right   Pain Descriptors / Indicators Aching;Sore   Pain Type Surgical pain   Pain Onset More than a month ago   Pain Frequency Constant            [facility] PT Assessment - 04/04/17 0001      AROM   Right Shoulder Flexion 90 Degrees  AAROM with dowel rod in supine                     [facility] Adult PT Treatment/Exercise - 04/04/17 0939      Shoulder Exercises: Seated   Other Seated Exercises protraction with UE ranger 2 x 12  1 set with scapular assist for protraction   Other Seated Exercises horizontal abduction/ adduction 2 x 10 with UE ranger     Shoulder Exercises: Pulleys   Flexion 3 minutes   ABduction 3 minutes     Shoulder Exercises: ROM/Strengthening   Other ROM/Strengthening Exercises standing wand flexion/ abduction 2 x 10   Other ROM/Strengthening Exercises supeine wand flexion 2 x 10  AAROM     Modalities   Modalities Moist Heat     Moist Heat Therapy   Number Minutes Moist Heat 10 Minutes   Moist Heat Location Shoulder  r in supine  Manual Therapy   Manual Therapy Scapular mobilization;Joint mobilization   Manual therapy comments cross friction massage over incision   Joint Mobilization grade 3 in anterior/ posterior with distraction, distal clavicle inferior/ posterior mobs grade 2-3   Scapular Mobilization protraction and upward assist with shoulder mobility                PT Education - 04/04/17 1017    Education provided Yes   Education Details updated HEP for shoulder AAROM.    Person(s) Educated Patient   Methods Explanation;Verbal cues   Comprehension Verbalized understanding;Verbal cues required          PT Short Term Goals - 03/29/17 1108      PT SHORT TERM GOAL #1   Title pt to be I with inital HEP    Baseline I with inital HEP   Time 3   Period Weeks   Status Achieved           PT Long Term Goals - 03/01/17 1727      PT LONG TERM GOAL #1   Title pt to increase R  shoulder AROM flexion/ abduction to >/= 90 degrees and IER to >/= 40 degrees with </= 4/10 pain for functional mobility required for ADLs with </= 2/10 pain   Baseline flexion 22 degrees / abduction 34 degrees,  ER 26 degrees   Time 6   Period Weeks   Status New   Target Date 04/12/17     PT LONG TERM GOAL #2   Title increase strength in R shoulder to >/= 3+ with </= 2/10 pain during testing for shoulder stability and assist with lifting and carrying activities    Baseline 3-/5 in all planes   Time 6   Period Weeks   Status New   Target Date 04/12/17     PT LONG TERM GOAL #3   Title pt will increase FOTO score to </= 48% limited to demo improvement in function   Baseline 71% lmited   Time 6   Period Weeks   Status New   Target Date 04/12/17     PT LONG TERM GOAL #4   Title pt will be I with all HEP given as of last visit    Baseline no previous HEP   Time 6   Period Weeks   Status New   Target Date 04/12/17               Plan - 04/04/17 1018    Clinical Impression Statement pt continues to report pain and tightness in the R shoulder. cointinued with AAROM exercises and utilized scapular assist with UE ranger which she performed well with minimal cues for slow controlled movement. MHP post session for pain today.    PT Next Visit Plan update HEP, ROM, goals, D/C   PT Home Exercise Plan table slides flexion/ abduction, scapular retraction, upper trap stretch, AAROM flexion/ abduction, supine flexio with wand   Consulted and Agree with Plan of Care Patient      Patient will benefit from skilled therapeutic intervention in order to improve the following deficits and impairments:  Pain, Improper body mechanics, Postural dysfunction, Decreased strength, Decreased range of motion, Impaired tone, Decreased endurance, Decreased activity tolerance, Increased fascial restricitons  Visit Diagnosis: Winged scapula of right side  Chronic right shoulder pain  Muscle weakness  (generalized)  Stiffness of right shoulder, not elsewhere classified  Weakness of right arm  Pain in joint of right shoulder  Problem List Patient Active Problem List   Diagnosis Date Noted  . 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 Suicidal ideations 03/30/2016  . MDD (major depressive disorder) 03/29/2016  . Breast pain 01/18/2016  . 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 Chronic urticaria 08/06/2015  . Paresthesia 04/15/2015  . Headache 02/20/2015  . 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 Right shoulder pain 08/22/2014  . DUB (dysfunctional uterine bleeding) 03/11/2014  . H/O rotator cuff surgery 01/02/2012  . 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 Iron deficiency anemia 01/02/2012  . Depression 01/02/2012   Starr Lake PT, DPT, LAT, ATC  04/04/17  10:24 AM      [facility] [address] [place], [place], [address] Phone: [phone]   Fax:  [phone]  Name: AMILLYA CHAVIRA MRN: [id number] Date of Birth: 03/19/1971

## 2017-04-12 ENCOUNTER — Encounter: Payer: Self-pay | Admitting: Physical Therapy

## 2017-04-12 ENCOUNTER — Ambulatory Visit: Payer: Medicaid Other | Admitting: Physical Therapy

## 2017-04-12 DIAGNOSIS — M25511 Pain in right shoulder: Secondary | ICD-10-CM

## 2017-04-12 DIAGNOSIS — M6281 Muscle weakness (generalized): Secondary | ICD-10-CM

## 2017-04-12 DIAGNOSIS — G8929 Other chronic pain: Secondary | ICD-10-CM

## 2017-04-12 DIAGNOSIS — M958 Other specified acquired deformities of musculoskeletal system: Secondary | ICD-10-CM

## 2017-04-12 DIAGNOSIS — M25611 Stiffness of right shoulder, not elsewhere classified: Secondary | ICD-10-CM

## 2017-04-12 DIAGNOSIS — R29898 Other symptoms and signs involving the musculoskeletal system: Secondary | ICD-10-CM
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
## 2017-04-13 ENCOUNTER — Ambulatory Visit (INDEPENDENT_AMBULATORY_CARE_PROVIDER_SITE_OTHER): Payer: Medicaid Other | Admitting: Orthopedic Surgery

## 2017-04-13 ENCOUNTER — Encounter (INDEPENDENT_AMBULATORY_CARE_PROVIDER_SITE_OTHER): Payer: Self-pay | Admitting: Orthopedic Surgery

## 2017-04-13 DIAGNOSIS — M958 Other specified acquired deformities of musculoskeletal system: Secondary | ICD-10-CM

## 2017-04-13 NOTE — Progress Notes (Signed)
Post-Op Visit Note   Patient: Annette Chang           Date of Birth: 10/06/1970           MRN: [id number] Visit Date: 04/13/2017 PCP: Carlyle Dolly, MD   Assessment & Plan:  Chief Complaint:  Chief Complaint  Patient presents with  . Right Shoulder - Follow-up   Visit Diagnoses: No diagnosis found.  Plan: per millimeter is a 46 year old patient 3 months out active scapular transfer for scapular winging.  Her pain is improved.  She still is having some functional limitations.  On examination there has been some recurrence of the scapular winging.  All incisions are intact.  Plan at this time is to continue with physical therapy.  I'll check her back in about 3 months.  The non-utilizedhalf of the  pectoralis major tendon has been salvaged.  It remains intact anteriorly..  Follow-Up Instructions: Return in about 3 months (around 07/13/2017).   Orders:  No orders of the defined types were placed in this encounter.  No orders of the defined types were placed in this encounter.   Imaging: No results found.  PMFS History: Patient Active Problem List   Diagnosis Date Noted  . 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 Suicidal ideations 03/30/2016  . MDD (major depressive disorder) 03/29/2016  . Breast pain 01/18/2016  . 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 Chronic urticaria 08/06/2015  . Paresthesia 04/15/2015  . Headache 02/20/2015  . 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 Right shoulder pain 08/22/2014  . DUB (dysfunctional uterine bleeding) 03/11/2014  . H/O rotator cuff surgery 01/02/2012  . 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 Iron deficiency anemia 01/02/2012  . Depression 01/02/2012   Past Medical History:  Diagnosis Date  . Anxiety   . 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 Depression   . H/O rotator cuff surgery 01/02/2012  . H/O tubal ligation 01/02/2012  . H/O: C-section 01/02/2012   3   . H/O: C-section 01/02/2012   3   .  Headache(784.0) 01/02/2012  . Iron deficiency anemia 01/02/2012  . 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 Diabetes Maternal Grandmother   . Diabetes Paternal Grandmother   . Diabetes Paternal Grandfather     Past Surgical History:  Procedure Laterality Date  . CESAREAN SECTION     3 previous c sections  . MUSCLE REPAIR Right 01/10/2017   RIGHT PECTORAL MAJOR TO SCAPULA MUSCLE TRANSFER (Right  . NOVASURE ABLATION N/A 03/11/2014   Procedure: NOVASURE ABLATION;  Surgeon: Emily Filbert, MD;  Location: [facility];  Service: Gynecology;  Laterality: N/A;  . PECTORALIS TENDON REPAIR Right 01/10/2017   Procedure: RIGHT PECTORAL MAJOR TO SCAPULA MUSCLE TRANSFER;  Surgeon: Meredith Pel, MD;  Location: [facility];  Service: Orthopedics;  Laterality: Right;  . rotator cuff surgery    . SHOULDER SURGERY    . TUBAL LIGATION     Social History   Occupational History  . unemployed    Social History Main Topics  . Smoking status: Current Some Day Smoker    Years: 5.00    Types: Cigarettes  . Smokeless tobacco: Never Used  . Alcohol use 8.4 oz/week    14 Glasses of wine per week     Comment: weekends  . 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 Sexual activity: Not Currently    Birth control/ protection: Surgical

## 2017-04-14 ENCOUNTER — Telehealth (INDEPENDENT_AMBULATORY_CARE_PROVIDER_SITE_OTHER): Payer: Self-pay

## 2017-04-14 NOTE — Telephone Encounter (Signed)
[facility] called needing demographics for patient to go with referral that we submitted to them. I faxed these demographics to her [phone]

## 2017-04-19 ENCOUNTER — Ambulatory Visit: Payer: Medicaid Other | Admitting: Physical Therapy

## 2017-05-23 ENCOUNTER — Telehealth (INDEPENDENT_AMBULATORY_CARE_PROVIDER_SITE_OTHER): Payer: Self-pay | Admitting: Orthopedic Surgery

## 2017-05-23 NOTE — Telephone Encounter (Signed)
Harjit, [place]  04/18/1971   Pt wanted to know if Dr.[name] could write a note so that she can receive some financial assistance to help her with her bills.  Pt stated she is having troubles paying her because she is till out of work.   Med refill Hydrocodone-acetaminophen (Norco/Vicodin) 5-325 Mg tablet

## 2017-05-23 NOTE — Telephone Encounter (Signed)
Ok for note 

## 2017-05-23 NOTE — Telephone Encounter (Signed)
y

## 2017-05-23 NOTE — Telephone Encounter (Signed)
Ok to refill hydrocodone? 

## 2017-05-23 NOTE — Telephone Encounter (Signed)
Please advise[facility] for note? [facility] for refill? She is 4 months out from scapular transfer for scapular winging. Last seen 04/13/17.

## 2017-05-24 ENCOUNTER — Other Ambulatory Visit (INDEPENDENT_AMBULATORY_CARE_PROVIDER_SITE_OTHER): Payer: Self-pay

## 2017-05-24 MED ORDER — HYDROCODONE-ACETAMINOPHEN 5-325 MG PO TABS: 1.0000 | ORAL_TABLET | Freq: Three times a day (TID) | ORAL | 0 refills | Status: DC | PRN

## 2017-05-24 MED ORDER — CYCLOBENZAPRINE HCL 5 MG PO TABS: 5.0000 mg | ORAL_TABLET | Freq: Three times a day (TID) | ORAL | 0 refills | Status: DC | PRN

## 2017-05-24 NOTE — Telephone Encounter (Signed)
IC advised could pick up at front desk.  

## 2017-05-24 NOTE — Addendum Note (Signed)
Addended byLaurann Montana on: 05/24/2017 08:47 AM   Modules accepted: Orders

## 2017-06-01 ENCOUNTER — Telehealth (INDEPENDENT_AMBULATORY_CARE_PROVIDER_SITE_OTHER): Payer: Self-pay | Admitting: Orthopedic Surgery

## 2017-06-01 NOTE — Telephone Encounter (Signed)
[facility] on [address]

## 2017-06-01 NOTE — Telephone Encounter (Signed)
Will submit prior auth for patient.

## 2017-07-30 NOTE — Progress Notes (Deleted)
Subjective:    Patient ID: Annette Chang , female   DOB: 1970-09-14 , 47 y.o..   MRN: [id number]  HPI  Karem Farha Pinera is a 47 yo F with PMH of DUB, tubal ligation and C-Section here for No chief complaint on file.   1.ABDOMINAL PAIN  Location: *** Onset: ***  Radiation: *** Severity: *** Quality: *** Duration: *** Better with: *** Worse with: ***   Symptoms Nausea/Vomiting: {YES/NO/WILD CARDS:18581}  Diarrhea: {YES/NO/WILD CARDS:18581}  Constipation: {YES/NO/WILD CARDS:18581}  Melena/BRBPR: {YES/NO/WILD CARDS:18581}  Hematemesis: {YES/NO/WILD HUDJS:97026}  Anorexia: {YES/NO/WILD CARDS:18581}  Fever/Chills: {YES/NO/WILD CARDS:18581}  Dysuria: {YES/NO/WILD CARDS:18581}  Rash: {YES/NO/WILD CARDS:18581}  Wt loss: {YES/NO/WILD CARDS:18581}  EtOH use: {YES/NO/WILD CARDS:18581}  NSAIDs/ASA: {YES/NO/WILD CARDS:18581}  LMP: *** Vaginal bleeding: {YES/NO/WILD VZCHY:85027}  STD risk/hx: {YES/NO/WILD XAJOI:78676}   Past Surgeries: C-Section and BTL  Review of Systems: Per HPI  Health Maintenance Due  Topic Date Due  . HIV Screening  07/28/1984  . PAP SMEAR  12/12/2016  . INFLUENZA VACCINE  02/22/2017    Past Medical History: Patient Active Problem List   Diagnosis Date Noted  . 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 Suicidal ideations 03/30/2016  . MDD (major depressive disorder) 03/29/2016  . Breast pain 01/18/2016  . 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 Chronic urticaria 08/06/2015  . Paresthesia 04/15/2015  . Headache 02/20/2015  . 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 Right shoulder pain 08/22/2014  . DUB (dysfunctional uterine bleeding) 03/11/2014  . H/O rotator cuff surgery 01/02/2012  . 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 Iron deficiency anemia 01/02/2012  . Depression 01/02/2012    Medications: reviewed and updated Current Outpatient Medications  Medication Sig Dispense Refill  . caffeine 200 MG TABS tablet Take 200 mg by mouth daily.      . Calcium-Vitamin D-Vitamin K 500-500-40 MG-UNT-MCG CHEW Chew 1 tablet by mouth daily.    . cyclobenzaprine (FLEXERIL) 5 MG tablet Take 1 tablet (5 mg total) by mouth 3 (three) times daily as needed for muscle spasms. 30 tablet 0  . gabapentin (NEURONTIN) 100 MG capsule Take 100 mg by mouth 2 (two) times daily.     Marland Kitchen gabapentin (NEURONTIN) 100 MG capsule take 1 capsule by mouth three times a day for 2 WEEKS THEN TAKE 2 CAPSULES 3 TIMES DAILY 180 capsule 4  . HYDROcodone-acetaminophen (NORCO) 10-325 MG tablet Take 1 tablet by mouth every 4 (four) hours as needed (breakthrough pain). 30 tablet 0  . HYDROcodone-acetaminophen (NORCO/VICODIN) 5-325 MG tablet Take 1 tablet by mouth every 8 (eight) hours as needed for moderate pain. 30 tablet 0  . hydrOXYzine (ATARAX/VISTARIL) 25 MG tablet Take 1 tablet (25 mg total) by mouth every 6 (six) hours as needed for anxiety. (Patient taking differently: Take 25 mg by mouth daily as needed for anxiety. ) 60 tablet 1  . naproxen sodium (ANAPROX) 220 MG tablet Take 220 mg by mouth 2 (two) times daily as needed (pain).    . ondansetron (ZOFRAN) 4 MG tablet Take 1 tablet (4 mg total) by mouth every 6 (six) hours as needed for nausea. 20 tablet 0  . vitamin B-12 (CYANOCOBALAMIN) 50 MCG tablet Take 50 mcg by mouth daily.     No current facility-administered medications for this visit.     Social Hx:  reports that she has been smoking cigarettes.  She has smoked for the past 5.00 years. she has never used smokeless tobacco.   Objective:   There were no vitals  taken for this visit. Physical Exam  Gen: NAD, alert, cooperative with exam, well-appearing HEENT: NCAT, PERRL, clear conjunctiva, oropharynx clear, supple neck Cardiac: Regular rate and rhythm, normal S1/S2, no murmur, no edema, capillary refill brisk  Respiratory: Clear to auscultation bilaterally, no wheezes, non-labored breathing Gastrointestinal: soft, non tender, non distended, bowel sounds  present Skin: no rashes, normal turgor  Neurological: no gross deficits.  Psych: good insight, normal mood and affect  Assessment & Plan:  No problem-specific Assessment & Plan notes found for this encounter.  No orders of the defined types were placed in this encounter.  No orders of the defined types were placed in this encounter.   Smitty Cords, MD [place], PGY-3

## 2017-07-31 ENCOUNTER — Ambulatory Visit: Payer: Self-pay | Admitting: Family Medicine

## 2017-08-24 ENCOUNTER — Encounter (INDEPENDENT_AMBULATORY_CARE_PROVIDER_SITE_OTHER): Payer: Self-pay | Admitting: Orthopedic Surgery

## 2017-08-24 ENCOUNTER — Ambulatory Visit (INDEPENDENT_AMBULATORY_CARE_PROVIDER_SITE_OTHER): Payer: Medicaid Other | Admitting: Orthopedic Surgery

## 2017-08-24 ENCOUNTER — Ambulatory Visit (INDEPENDENT_AMBULATORY_CARE_PROVIDER_SITE_OTHER): Payer: Medicaid Other

## 2017-08-24 DIAGNOSIS — G8929 Other chronic pain: Secondary | ICD-10-CM

## 2017-08-24 DIAGNOSIS — M25511 Pain in right shoulder: Secondary | ICD-10-CM

## 2017-08-24 NOTE — Progress Notes (Signed)
Office Visit Note   Patient: Annette Chang           Date of Birth: 07-14-1971           MRN: [id number] Visit Date: 08/24/2017 Requested by: Carlyle Dolly, MD [place], [place] [address] PCP: Carlyle Dolly, MD  Subjective: Chief Complaint  Patient presents with  . 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  Surgery done 01/10/2017.  Over the past 2 weeks she reports increasing pain.  She has been back to work for the past 2 months but she is not doing any lifting.  Denies any numbness and tingling in the arm.              ROS: All systems reviewed are negative as they relate to the chief complaint within the history of present illness.  Patient denies  fevers or chills.   Assessment & Plan: Visit Diagnoses:  1. Chronic right shoulder pain     Plan: Impression is stretching versus dislodgment of the pectoral muscle attachment to the inferior pole of the scapula.  Radiographs do confirm enthesopathic changes at the inferior pole of the scapula.  I think the bone fragment is also visible at the location where the scapula should be.  This is a difficult situation in terms of whether or not to revise this or have her live with it.  I think a revision would potentially necessitate augmentation and or transscapular drilling to anchor this piece in a way that it stays in place.  Revision situation may not be able to mobilize this piece to the degree necessary for necessary treatment.  Plan is to obtain CT scan just to confirm that this is the bony fragments with the pack attached.  If it is then we can consider some other type of augmentation and possible revision surgery.  Follow-Up Instructions: Return in about 4 months (around 12/22/2017).   Orders:  Orders Placed This Encounter  Procedures  . XR Shoulder Right   No orders of the defined types were  placed in this encounter.     Procedures: No procedures performed   Clinical Data: No additional findings.  Objective: Vital Signs: There were no vitals taken for this visit.  Physical Exam:   Constitutional: Patient appears well-developed HEENT:  Head: Normocephalic Eyes:EOM are normal Neck: Normal range of motion Cardiovascular: Normal rate Pulmonary/chest: Effort normal Neurologic: Patient is alert Skin: Skin is warm Psychiatric: Patient has normal mood and affect    Ortho Exam: .  Breathing orthopedic exam demonstrates diminished active forward flexion and abduction with scapular winging noted on the right.  She does have rotational abnormality of the scapula with forward flexion of the arm.  Specialty Comments:  No specialty comments available.  Imaging: Xr Shoulder Right  Result Date: 08/24/2017 AP axillary outlet right shoulder reviewed.  Endobutton fixation noted in the region of the pectoralis attachment.  There are enthesopathic changes at the inferior aspect of the scapula.  There has been some rotation of the scapula consistent with scapular winging.  Visualized glenohumeral joint normal with no fracture or dislocation.    PMFS History: Patient Active Problem List   Diagnosis Date Noted  . 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 Suicidal ideations 03/30/2016  . MDD (major depressive disorder) 03/29/2016  . Breast pain 01/18/2016  . 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 Chronic  urticaria 08/06/2015  . Paresthesia 04/15/2015  . Headache 02/20/2015  . 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 Right shoulder pain 08/22/2014  . DUB (dysfunctional uterine bleeding) 03/11/2014  . H/O rotator cuff surgery 01/02/2012  . 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 Iron deficiency anemia 01/02/2012  . Depression 01/02/2012   Past Medical History:  Diagnosis Date  . Anxiety   . 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 Depression   . H/O rotator  cuff surgery 01/02/2012  . H/O tubal ligation 01/02/2012  . H/O: C-section 01/02/2012   3   . H/O: C-section 01/02/2012   3   . Headache(784.0) 01/02/2012  . Iron deficiency anemia 01/02/2012  . 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 Diabetes Maternal Grandmother   . Diabetes Paternal Grandmother   . Diabetes Paternal Grandfather     Past Surgical History:  Procedure Laterality Date  . CESAREAN SECTION     3 previous c sections  . MUSCLE REPAIR Right 01/10/2017   RIGHT PECTORAL MAJOR TO SCAPULA MUSCLE TRANSFER (Right  . NOVASURE ABLATION N/A 03/11/2014   Procedure: NOVASURE ABLATION;  Surgeon: Emily Filbert, MD;  Location: [facility];  Service: Gynecology;  Laterality: N/A;  . PECTORALIS TENDON REPAIR Right 01/10/2017   Procedure: RIGHT PECTORAL MAJOR TO SCAPULA MUSCLE TRANSFER;  Surgeon: Meredith Pel, MD;  Location: [facility];  Service: Orthopedics;  Laterality: Right;  . rotator cuff surgery    . SHOULDER SURGERY    . TUBAL LIGATION     Social History   Occupational History  . Occupation: unemployed  Tobacco Use  . Smoking status: Current Some Day Smoker    Years: 5.00    Types: Cigarettes  . Smokeless tobacco: Never Used  Substance and Sexual Activity  . Alcohol use: Yes    Alcohol/week: 8.4 oz    Types: 14 Glasses of wine per week    Comment: weekends  . 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 Sexual activity: Not Currently    Birth control/protection: Surgical

## 2017-08-24 NOTE — Addendum Note (Signed)
Addended byLaurann Montana on: 08/24/2017 12:14 PM   Modules accepted: Orders

## 2017-08-30 ENCOUNTER — Telehealth (INDEPENDENT_AMBULATORY_CARE_PROVIDER_SITE_OTHER): Payer: Self-pay | Admitting: Orthopedic Surgery

## 2017-08-30 MED ORDER — HYDROCODONE-ACETAMINOPHEN 5-325 MG PO TABS: 1.0000 | ORAL_TABLET | Freq: Two times a day (BID) | ORAL | 0 refills | Status: DC | PRN

## 2017-08-30 NOTE — Telephone Encounter (Signed)
Patient requesting RX refill on Hydrocodone. Also she is asking about Dr. Marlou Sa researching to see what they can do about her shoulder, they had discussed this at her last appt? Please advise # [phone]

## 2017-08-30 NOTE — Telephone Encounter (Signed)
y

## 2017-08-30 NOTE — Telephone Encounter (Signed)
rx put up front for patient to pick up. I LM for patient advising about the rx and reminder that she would ve called about the CT scan.

## 2017-08-30 NOTE — Telephone Encounter (Signed)
I have let her know we are ordering the scan but she is asking for refill on norco.

## 2017-09-05 ENCOUNTER — Ambulatory Visit: Payer: Self-pay | Admitting: Family Medicine

## 2017-09-15 ENCOUNTER — Ambulatory Visit
Admission: RE | Admit: 2017-09-15 | Discharge: 2017-09-15 | Disposition: A | Discharge status: Home / Self Care | Payer: Medicaid Other | Source: Ambulatory Visit | Attending: Orthopedic Surgery | Admitting: Orthopedic Surgery

## 2017-09-15 DIAGNOSIS — G8929 Other chronic pain: Secondary | ICD-10-CM

## 2017-09-15 DIAGNOSIS — M25511 Pain in right shoulder: Principal | ICD-10-CM

## 2017-10-02 ENCOUNTER — Ambulatory Visit (INDEPENDENT_AMBULATORY_CARE_PROVIDER_SITE_OTHER): Payer: Medicaid Other | Admitting: Orthopedic Surgery

## 2017-10-03 ENCOUNTER — Ambulatory Visit (INDEPENDENT_AMBULATORY_CARE_PROVIDER_SITE_OTHER): Payer: Medicaid Other | Admitting: Orthopedic Surgery

## 2017-10-03 ENCOUNTER — Encounter (INDEPENDENT_AMBULATORY_CARE_PROVIDER_SITE_OTHER): Payer: Self-pay | Admitting: Orthopedic Surgery

## 2017-10-03 DIAGNOSIS — M25511 Pain in right shoulder: Secondary | ICD-10-CM | POA: Diagnosis not present

## 2017-10-03 DIAGNOSIS — G8929 Other chronic pain: Secondary | ICD-10-CM

## 2017-10-03 MED ORDER — HYDROCODONE-ACETAMINOPHEN 5-325 MG PO TABS: ORAL_TABLET | ORAL | 0 refills | Status: DC

## 2017-10-04 ENCOUNTER — Encounter (INDEPENDENT_AMBULATORY_CARE_PROVIDER_SITE_OTHER): Payer: Self-pay | Admitting: Orthopedic Surgery

## 2017-10-04 NOTE — Progress Notes (Signed)
Office Visit Note   Patient: Annette Chang           Date of Birth: July 23, 1971           MRN: [id number] Visit Date: 10/03/2017 Requested by: Carlyle Dolly, MD [place], [place] [address] PCP: Carlyle Dolly, MD  Subjective: Chief Complaint  Patient presents with  . 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  She did well for several months but then had recurrence of pain and deformity.  Since I have seen her she has had a CT scan.  Plain radiographs suggested that the sutures may have pulled through the bone.  I discussed this with the radiologist.  She still having the trouble now with having the scapula as a stable platform.              ROS: All systems reviewed are negative as they relate to the chief complaint within the history of present illness.  Patient denies  fevers or chills.   Assessment & Plan: Visit Diagnoses:  1. Chronic right shoulder pain     Plan: Impression is scapular winging with pulling away of the transferred bone block with half the pec tendon.  The real question here is is that a retrievable structure which could be reattached to the scapula through a hole placed in the inferior aspect of the scapula.  Difficult to say if that is can be possible but we would need an MRI scan to assess the location of the transferred tendon edge.  It is possible that a revision scenario may be possible if enough excursion can be obtained.  Plan MRI scan to evaluate location of the transferred pack tendon to see if revision scenario is possible.  Follow-Up Instructions: Return for after MRI.   Orders:  Orders Placed This Encounter  Procedures  . MR SHOULDER RIGHT WO CONTRAST  . DG BONE DENSITY (DXA)   Meds ordered this encounter  Medications  . HYDROcodone-acetaminophen (NORCO/VICODIN) 5-325 MG tablet    Sig: 1 po q 12 hrs prn    Dispense:  30 tablet    Refill:  0       Procedures: No procedures performed   Clinical Data: No additional findings.  Objective: Vital Signs: There were no vitals taken for this visit.  Physical Exam:   Constitutional: Patient appears well-developed HEENT:  Head: Normocephalic Eyes:EOM are normal Neck: Normal range of motion Cardiovascular: Normal rate Pulmonary/chest: Effort normal Neurologic: Patient is alert Skin: Skin is warm Psychiatric: Patient has normal mood and affect  Ortho exam demonstrates  Ortho Exam: Recurrence of that scapular winging.  She has diminished forward flexion and abduction on that right-hand side.  Motor and sensory function to the right hand is intact radial pulses intact  Specialty Comments:  No specialty comments available.  Imaging: No results found.   PMFS History: Patient Active Problem List   Diagnosis Date Noted  . 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 Suicidal ideations 03/30/2016  . MDD (major depressive disorder) 03/29/2016  . Breast pain 01/18/2016  . 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 Chronic urticaria 08/06/2015  . Paresthesia 04/15/2015  . Headache 02/20/2015  . 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 Right shoulder pain 08/22/2014  . DUB (dysfunctional uterine bleeding) 03/11/2014  . H/O rotator cuff surgery 01/02/2012  . Arthritis of shoulder region, right  01/02/2012  . Iron deficiency anemia 01/02/2012  . Depression 01/02/2012   Past Medical History:  Diagnosis Date  . Anxiety   . 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 Depression   . H/O rotator cuff surgery 01/02/2012  . H/O tubal ligation 01/02/2012  . H/O: C-section 01/02/2012   3   . H/O: C-section 01/02/2012   3   . Headache(784.0) 01/02/2012  . Iron deficiency anemia 01/02/2012  . 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 Diabetes Maternal Grandmother   . Diabetes Paternal Grandmother   . Diabetes Paternal Grandfather       Past Surgical History:  Procedure Laterality Date  . CESAREAN SECTION     3 previous c sections  . MUSCLE REPAIR Right 01/10/2017   RIGHT PECTORAL MAJOR TO SCAPULA MUSCLE TRANSFER (Right  . NOVASURE ABLATION N/A 03/11/2014   Procedure: NOVASURE ABLATION;  Surgeon: Emily Filbert, MD;  Location: [facility];  Service: Gynecology;  Laterality: N/A;  . PECTORALIS TENDON REPAIR Right 01/10/2017   Procedure: RIGHT PECTORAL MAJOR TO SCAPULA MUSCLE TRANSFER;  Surgeon: Meredith Pel, MD;  Location: [facility];  Service: Orthopedics;  Laterality: Right;  . rotator cuff surgery    . SHOULDER SURGERY    . TUBAL LIGATION     Social History   Occupational History  . Occupation: unemployed  Tobacco Use  . Smoking status: Current Some Day Smoker    Years: 5.00    Types: Cigarettes  . Smokeless tobacco: Never Used  Substance and Sexual Activity  . Alcohol use: Yes    Alcohol/week: 8.4 oz    Types: 14 Glasses of wine per week    Comment: weekends  . 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 Sexual activity: Not Currently    Birth control/protection: Surgical

## 2017-10-12 ENCOUNTER — Other Ambulatory Visit: Payer: Self-pay | Admitting: Family Medicine

## 2017-10-12 DIAGNOSIS — Z1231 Encounter for screening mammogram for malignant neoplasm of breast: Secondary | ICD-10-CM

## 2017-10-13 ENCOUNTER — Telehealth (INDEPENDENT_AMBULATORY_CARE_PROVIDER_SITE_OTHER): Payer: Self-pay | Admitting: Orthopedic Surgery

## 2017-10-13 NOTE — Telephone Encounter (Signed)
Cherish  [phone]  Diagnosis code changed cannot use right shoulder pain.Pt insurance will not cover with change in [place].

## 2017-10-16 NOTE — Telephone Encounter (Signed)
I received this note. Not sure what it is referring to. Was not sure if you needed this information or if you knew anything about it.

## 2017-10-17 ENCOUNTER — Other Ambulatory Visit (INDEPENDENT_AMBULATORY_CARE_PROVIDER_SITE_OTHER): Payer: Self-pay | Admitting: Orthopedic Surgery

## 2017-10-17 DIAGNOSIS — M25511 Pain in right shoulder: Secondary | ICD-10-CM

## 2017-10-17 DIAGNOSIS — D509 Iron deficiency anemia, unspecified: Secondary | ICD-10-CM

## 2017-10-17 DIAGNOSIS — G8929 Other chronic pain: Secondary | ICD-10-CM

## 2017-10-20 ENCOUNTER — Other Ambulatory Visit: Payer: Self-pay

## 2017-10-20 ENCOUNTER — Encounter (HOSPITAL_COMMUNITY): Payer: Self-pay | Admitting: *Deleted

## 2017-10-20 ENCOUNTER — Emergency Department (HOSPITAL_COMMUNITY): Payer: Medicaid Other

## 2017-10-20 ENCOUNTER — Emergency Department (HOSPITAL_COMMUNITY)
Admission: EM | Admit: 2017-10-20 | Discharge: 2017-10-20 | Disposition: A | Discharge status: Home / Self Care | Payer: Medicaid Other | Attending: Emergency Medicine | Admitting: Emergency Medicine

## 2017-10-20 DIAGNOSIS — M79675 Pain in left toe(s): Secondary | ICD-10-CM | POA: Diagnosis not present

## 2017-10-20 DIAGNOSIS — F17210 Nicotine dependence, cigarettes, uncomplicated: Secondary | ICD-10-CM | POA: Diagnosis not present

## 2017-10-20 DIAGNOSIS — Z79899 Other long term (current) drug therapy: Secondary | ICD-10-CM | POA: Insufficient documentation

## 2017-10-20 MED ORDER — IBUPROFEN 600 MG PO TABS: 600.0000 mg | ORAL_TABLET | Freq: Four times a day (QID) | ORAL | 0 refills | Status: DC | PRN

## 2017-10-20 NOTE — ED Triage Notes (Signed)
Pt thinks she broke her toe going up stairs last nite.  It is left pinkie toe

## 2017-10-20 NOTE — Discharge Instructions (Addendum)
Please read and follow all provided instructions.  You have been seen today for left 5th toe pain  Tests performed today include: An x-ray of the affected area - does NOT show any broken bones or dislocations.  Vital signs. See below for your results today.   Home care instructions: -- *PRICE in the first 24-48 hours after injury: Protect (with brace, splint, sling), if given by your provider - post op shoe Rest Ice- Do not apply ice pack directly to your skin, place towel or similar between your skin and ice/ice pack. Apply ice for 20 min, then remove for 40 min while awake Compression- Wear brace, elastic bandage, splint as directed by your provider Elevate affected extremity above the level of your heart when not walking around for the first 24-48 hours   Use Ibuprofen (Motrin/Advil) 600mg  every 6 hours as needed for pain (do not exceed max dose in 24 hours, 2400mg )  Follow-up instructions: Please follow-up with your primary care provider or the provided orthopedic physician (bone specialist) if you continue to have significant pain in 1 week. In this case you may have a more severe injury that requires further care.   Return instructions:  Please return if your toes or feet are numb or tingling, appear gray or blue, or you have severe pain (also elevate the leg and loosen splint or wrap if you were given one) Please return to the Emergency Department if you experience worsening symptoms.  Please return if you have any other emergent concerns. Additional Information:  Your vital signs today were: BP 111/87 (BP Location: Right Arm)    Pulse 75    Temp 98.5 F (36.9 C) (Oral)    Resp 20    SpO2 100%  If your blood pressure (BP) was elevated above 135/85 this visit, please have this repeated by your doctor within one month. ---------------

## 2017-10-20 NOTE — ED Provider Notes (Signed)
[facility] EMERGENCY DEPARTMENT Provider Note   CSN: [id number] Arrival date & time: 10/20/17  0865     History   Chief Complaint No chief complaint on file.   HPI Annette Chang is a 47 y.o. female who presents the emergency department today for left fifth toe pain.  Patient states that she was walking up her stairs yesterday when she stubbed her toe.  She denies fall, head trauma or loss of consciousness.  She has been able to ambulate since the event but notes pain with this.  She now reports that her pain has increased since this morning.  She is not taking anything for her symptoms.  She denies any paresthesias.  No open wounds.  HPI  Past Medical History:  Diagnosis Date  . Anxiety   . 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 Depression   . H/O rotator cuff surgery 01/02/2012  . H/O tubal ligation 01/02/2012  . H/O: C-section 01/02/2012   3   . H/O: C-section 01/02/2012   3   . Headache(784.0) 01/02/2012  . Iron deficiency anemia 01/02/2012  . 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 Suicidal ideations 03/30/2016  . MDD (major depressive disorder) 03/29/2016  . Breast pain 01/18/2016  . 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 Chronic urticaria 08/06/2015  . Paresthesia 04/15/2015  . Headache 02/20/2015  . 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 Right shoulder pain 08/22/2014  . DUB (dysfunctional uterine bleeding) 03/11/2014  . H/O rotator cuff surgery 01/02/2012  . 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 Iron deficiency anemia 01/02/2012  . Depression 01/02/2012    Past Surgical History:  Procedure Laterality Date  . CESAREAN SECTION     3 previous c sections  . MUSCLE REPAIR Right 01/10/2017   RIGHT PECTORAL MAJOR TO SCAPULA MUSCLE TRANSFER (Right  . NOVASURE ABLATION N/A 03/11/2014   Procedure: NOVASURE ABLATION;  Surgeon:  Emily Filbert, MD;  Location: [facility];  Service: Gynecology;  Laterality: N/A;  . PECTORALIS TENDON REPAIR Right 01/10/2017   Procedure: RIGHT PECTORAL MAJOR TO SCAPULA MUSCLE TRANSFER;  Surgeon: Meredith Pel, MD;  Location: [facility];  Service: Orthopedics;  Laterality: Right;  . rotator cuff surgery    . SHOULDER SURGERY    . TUBAL LIGATION       OB History    Gravida  3   Para  3   Term  3   Preterm      AB      Living  2     SAB      TAB      Ectopic      Multiple      Live Births               Home Medications    Prior to Admission medications   Medication Sig Start Date End Date Taking? Authorizing Provider  caffeine 200 MG TABS tablet Take 200 mg by mouth daily.    [provider]  Calcium-Vitamin D-Vitamin K 500-500-40 MG-UNT-MCG CHEW Chew 1 tablet by mouth daily.    [provider]  cyclobenzaprine (FLEXERIL) 5 MG tablet Take 1 tablet (5 mg total) by mouth 3 (three) times daily as needed for muscle spasms. 05/24/17   Meredith Pel, MD  gabapentin (NEURONTIN) 100 MG capsule Take 100 mg by  mouth 2 (two) times daily.     [provider]  gabapentin (NEURONTIN) 100 MG capsule take 1 capsule by mouth three times a day for 2 WEEKS THEN TAKE 2 CAPSULES 3 TIMES DAILY 03/16/17   Meredith Pel, MD  HYDROcodone-acetaminophen [facility]) 10-325 MG tablet Take 1 tablet by mouth every 4 (four) hours as needed (breakthrough pain). 01/12/17   Meredith Pel, MD  HYDROcodone-acetaminophen (NORCO/VICODIN) 5-325 MG tablet Take 1 tablet by mouth every 12 (twelve) hours as needed for moderate pain. 08/30/17   Meredith Pel, MD  HYDROcodone-acetaminophen (NORCO/VICODIN) 5-325 MG tablet 1 po q 12 hrs prn 10/03/17   Meredith Pel, MD  hydrOXYzine (ATARAX/VISTARIL) 25 MG tablet Take 1 tablet (25 mg total) by mouth every 6 (six) hours as needed for anxiety. Patient taking differently: Take 25 mg by mouth daily as needed for anxiety.   08/24/16   Rumley, [place] N, DO  naproxen sodium (ANAPROX) 220 MG tablet Take 220 mg by mouth 2 (two) times daily as needed (pain).    [provider]  ondansetron (ZOFRAN) 4 MG tablet Take 1 tablet (4 mg total) by mouth every 6 (six) hours as needed for nausea. 01/12/17   Meredith Pel, MD  vitamin B-12 (CYANOCOBALAMIN) 50 MCG tablet Take 50 mcg by mouth daily.    [provider]    Family History Family History  Problem Relation Age of Onset  . Diabetes Maternal Grandmother   . Diabetes Paternal Grandmother   . Diabetes Paternal Grandfather     Social History Social History   Tobacco Use  . Smoking status: Current Some Day Smoker    Years: 5.00    Types: Cigarettes  . Smokeless tobacco: Never Used  Substance Use Topics  . Alcohol use: Yes    Alcohol/week: 8.4 oz    Types: 14 Glasses of wine per week    Comment: weekends  . 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    Physical Exam Updated Vital Signs BP 111/87 (BP Location: Right Arm)   Pulse 75   Temp 98.5 F (36.9 C) (Oral)   Resp 20   SpO2 100%   Physical Exam  Constitutional: She appears well-developed and well-nourished.  HENT:  Head: Normocephalic and atraumatic.  Right Ear: External ear normal.  Left Ear: External ear normal.  Eyes: Conjunctivae are normal. Right eye exhibits no discharge. Left eye exhibits no discharge. No scleral icterus.  Cardiovascular:  Pulses:      Dorsalis pedis pulses are 2+ on the left side.       Posterior tibial pulses are 2+ on the left side.  Pulmonary/Chest: Effort normal. No respiratory distress.  Musculoskeletal:       Left ankle: Normal.       Feet:  Left ankle normal.  Achilles intact.  Compartments soft.  No tenderness palpation over the  navicular bone or base of the fifth metatarsal.  There is pain over the head of the fifth metatarsal.  Normal range of motion.  No open wounds.  She is neurovascular intact.  Neurological: She is alert.  Skin: Skin is warm, dry and intact. Capillary refill takes less than 2 seconds. No pallor.  No open wounds.  No joint swelling.  No erythema or ecchymosis.  Psychiatric: She has a normal mood and affect.  Nursing note and vitals reviewed.    ED Treatments / Results  Labs (all labs ordered are listed, but only abnormal results are displayed) Labs Reviewed - No data to display  EKG None  Radiology Dg Foot Complete Left  Result Date: 10/20/2017 CLINICAL DATA:  Patient stubbed left foot on concrete stair on 10/19/17. Intense little toe pain, no previous injury. EXAM: LEFT FOOT - COMPLETE 3+ VIEW COMPARISON:  None. FINDINGS: There is no evidence of fracture or dislocation. There is no evidence of arthropathy or other focal bone abnormality. Soft tissues are unremarkable. IMPRESSION: Negative. Electronically Signed   By: Franki Cabot M.D.   On: 10/20/2017 10:35    Procedures Procedures (including critical care time)  Medications Ordered in ED Medications - No data to display   Initial Impression / Assessment and Plan / ED Course  I have reviewed the triage vital signs and the nursing notes.  Pertinent labs & imaging results that were available during my care of the patient were reviewed by me and considered in my medical decision making (see chart for details).     48 year old female presenting with left fifth toe pain.  No open wounds.  She is neurovascular intact.  X-ray negative for fracture.  Patient placed in postop shoe for comfort.  She is to follow with her primary care provider for persistent symptoms.  Made patient aware of possibility of missed fracture.  Conservative therapy recommended.  Strict return precautions discussed.  Patient appears safe for discharge.  Final  Clinical Impressions(s) / ED Diagnoses   Final diagnoses:  Pain of toe of left foot    ED Discharge Orders        Ordered    ibuprofen (ADVIL,MOTRIN) 600 MG tablet  Every 6 hours PRN     10/20/17 1135       Lorelle Gibbs 10/20/17 1136    Charlesetta Shanks, MD 10/28/17 1556

## 2017-10-26 ENCOUNTER — Encounter (INDEPENDENT_AMBULATORY_CARE_PROVIDER_SITE_OTHER): Payer: Self-pay | Admitting: *Deleted

## 2017-10-30 ENCOUNTER — Other Ambulatory Visit (INDEPENDENT_AMBULATORY_CARE_PROVIDER_SITE_OTHER): Payer: Self-pay | Admitting: Orthopedic Surgery

## 2017-10-30 ENCOUNTER — Telehealth (INDEPENDENT_AMBULATORY_CARE_PROVIDER_SITE_OTHER): Payer: Self-pay | Admitting: Orthopedic Surgery

## 2017-10-30 DIAGNOSIS — E559 Vitamin D deficiency, unspecified: Secondary | ICD-10-CM

## 2017-10-30 DIAGNOSIS — E58 Dietary calcium deficiency: Secondary | ICD-10-CM

## 2017-10-30 NOTE — Telephone Encounter (Signed)
Patient called asking for a refill on her hydrocodone. CB # [phone]

## 2017-10-30 NOTE — Telephone Encounter (Signed)
ok 

## 2017-10-30 NOTE — Telephone Encounter (Signed)
Ok to rf? 

## 2017-10-30 NOTE — Telephone Encounter (Signed)
Patient came in this morning stating that Medicaid would not pay for her bone density test.  [facility] was suppose to call our office today.  But the patient stated that there was a particular form that they needed.  CB#[phone].  Thank you.

## 2017-10-30 NOTE — Telephone Encounter (Signed)
I put in new dx code of vit d def and calcuim def.

## 2017-10-30 NOTE — Telephone Encounter (Signed)
I called pt and advised her that per medicaid did not require preauthorization. I am also waiting on a call back from ally with [facility] to return my call in reference to placing a new dx code.

## 2017-10-31 MED ORDER — HYDROCODONE-ACETAMINOPHEN 5-325 MG PO TABS: 1.0000 | ORAL_TABLET | Freq: Two times a day (BID) | ORAL | 0 refills | Status: DC | PRN

## 2017-10-31 NOTE — Addendum Note (Signed)
Addended byLaurann [place] on: 10/31/2017 11:31 AM   Modules accepted: Orders

## 2017-10-31 NOTE — Telephone Encounter (Signed)
rx printed. IC  patient advised that she could pick up tomorrow morning-Dr Marlou Sa will not be here to sign before that time.

## 2017-11-07 ENCOUNTER — Ambulatory Visit: Payer: Self-pay

## 2017-11-20 ENCOUNTER — Telehealth (INDEPENDENT_AMBULATORY_CARE_PROVIDER_SITE_OTHER): Payer: Self-pay | Admitting: Orthopedic Surgery

## 2017-11-20 MED ORDER — HYDROCODONE-ACETAMINOPHEN 5-325 MG PO TABS: 1.0000 | ORAL_TABLET | Freq: Two times a day (BID) | ORAL | 0 refills | Status: DC | PRN

## 2017-11-20 NOTE — Telephone Encounter (Signed)
Tried calling patient. No answer. No VM to LM. I need to s/w patient when she calls. Needs to be advised that we can refill medication but that she needs to have the MRI scan done. Dr Marlou Sa ordered it over a month ago and she still has not had it done. Holding script at my desk.

## 2017-11-20 NOTE — Telephone Encounter (Signed)
Patient called inquiring about her medication refill.  I advised her of the message from you.  Patient stated that she will get that scheduled today.  CB#[phone].  Thank you.

## 2017-11-20 NOTE — Telephone Encounter (Signed)
Ok to refill norco? Just received #30 on 10/31/17

## 2017-11-20 NOTE — Telephone Encounter (Signed)
Med refill  °Hydrocodone  ° °

## 2017-11-20 NOTE — Telephone Encounter (Signed)
Ok to rf needs scan

## 2017-11-20 NOTE — Telephone Encounter (Signed)
I s/w patient. Expressed importance of patient getting studies that Dr Marlou Sa had ordered both the MRI scan and also the bone density. She is now scheduled for the bone density study but she states that every time that she goes to [facility], they tell her that her insurance will not cover the MRI scan. Can you please advise? Thanks.

## 2017-11-22 NOTE — Telephone Encounter (Signed)
I Received fax from [facility] requesting Reason for exam and advised me of upcoming appt of 12/06/17 at 9am and orders needs to be signed..then I saw in epic that pt is scheduled for Bone Density at [facility] on May 7.   I C pt and left vm to return my call in reference to which one she is wanting to go to before faxing over to [facility]. Pending call back from pt.

## 2017-11-23 NOTE — Telephone Encounter (Signed)
Pt called back and wants to go to [facility], I will have ordering provider sign order and fax to them .

## 2017-11-23 NOTE — Telephone Encounter (Signed)
Order faxed.

## 2017-11-28 ENCOUNTER — Inpatient Hospital Stay: Admission: RE | Admit: 2017-11-28 | Payer: Self-pay | Source: Ambulatory Visit

## 2017-11-30 ENCOUNTER — Other Ambulatory Visit: Payer: Self-pay

## 2017-12-04 ENCOUNTER — Telehealth (INDEPENDENT_AMBULATORY_CARE_PROVIDER_SITE_OTHER): Payer: Self-pay | Admitting: Orthopedic Surgery

## 2017-12-04 ENCOUNTER — Encounter: Payer: Self-pay | Admitting: Family Medicine

## 2017-12-04 ENCOUNTER — Ambulatory Visit (INDEPENDENT_AMBULATORY_CARE_PROVIDER_SITE_OTHER): Payer: Medicaid Other | Admitting: Family Medicine

## 2017-12-04 DIAGNOSIS — M79675 Pain in left toe(s): Secondary | ICD-10-CM | POA: Diagnosis present

## 2017-12-04 DIAGNOSIS — F419 Anxiety disorder, unspecified: Secondary | ICD-10-CM | POA: Diagnosis not present

## 2017-12-04 MED ORDER — HYDROXYZINE HCL 25 MG PO TABS: 25.0000 mg | ORAL_TABLET | Freq: Every day | ORAL | 0 refills | Status: DC | PRN

## 2017-12-04 NOTE — Telephone Encounter (Signed)
I tried calling. No answer. LMVM advising could call back on Wednesday per Dr Marlou Sa and we could get it filled at that time per Dr Marlou Sa.

## 2017-12-04 NOTE — Telephone Encounter (Signed)
Patient called, I advised her of message and she said she would call back Wednesday.

## 2017-12-04 NOTE — Progress Notes (Signed)
Subjective:    Patient ID: Annette Chang , female   DOB: [birth year] , 47 y.o..   MRN: [id number]  HPI  [name] is here for  Chief Complaint  Patient presents with  . Toe Pain    left fifth digit    1. Left 5th digit toe pain: Patient notes that she stubbed her toe on March 28th while walking up the stairs.  She is concerned because she still has some pain in this toe.  She denies any numbness.  She is also concerned because there seems to be some darkening of the skin in this toe compared to the rest of her toes.  2.  Anxiety: Patient notes that at times she will get anxious and will prevent her from going to sleep.  She denies any panic attacks per se.  She usually uses hydroxyzine to help her calm down and get to sleep.  She has been doing this regimen for quite some time and has worked for her.  She does not need the hydroxyzine every single day only when she feels very anxious.  Denies any depression or suicidal ideation at this time  Review of Systems: Per HPI.   Past Medical History: Patient Active Problem List   Diagnosis Date Noted  . Toe pain, left 12/05/2017  . Anxiety 12/05/2017  . 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 Suicidal ideations 03/30/2016  . MDD (major depressive disorder) 03/29/2016  . Breast pain 01/18/2016  . 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 Chronic urticaria 08/06/2015  . Paresthesia 04/15/2015  . Headache 02/20/2015  . 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 Right shoulder pain 08/22/2014  . DUB (dysfunctional uterine bleeding) 03/11/2014  . H/O rotator cuff surgery 01/02/2012  . 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 Iron deficiency anemia 01/02/2012    Medications: reviewed   Social Hx:  reports that she has been smoking cigarettes.  She has smoked for the past 5.00 years. She has never used smokeless tobacco.   Objective:   BP 110/85 (BP Location: Left Arm, Patient Position: Sitting, Cuff Size: Normal)   Pulse 60    Temp 98.1 F (36.7 C) (Oral)   Wt 94 lb (42.6 kg)   SpO2 100%   BMI 17.76 kg/m  Physical Exam  Gen: NAD, alert, cooperative with exam, well-appearing Left Foot: No visible swelling, ecchymosis, erythema, ulcers, calluses, blister.  The skin over the fifth digit does appear slightly more hyperpigmented than compared to the rest of the digits. Arch: Normal w/o pes cavus or planus  Toes: no claw or hammer deformity  No pain at MT heads No pain at base of 5th MT; No tenderness over cuboid; No tenderness over N spot or navicular prominence No tenderness on lateral and medial malleolus Endorses tenderness when flexing and extending the fifth digit Full range of motion in plantarflexion, dorsiflexion, inversion, and eversion of the foot; flexion and extension of the toes Strength: 5/5 in all directions. Sensation: intact Vascular: intact w/ dorsalis pedis & posterior tibialis pulses 2+ Able to ambulate without difficulty  Assessment & Plan:  Toe pain, left Pain in the left fifth toe since March 28 after stubbing her toe on the stairs.  Went to the ED at that time and x-ray was unremarkable for any fracture.  Only abnormality seen on exam today is the left fifth toe does appear slightly more hyperpigmented compared to the rest of the digits and she does endorse pain with extension  and flexion of her fifth digit.  She is neurovascularly intact, do not think that the hyperpigmentation is from necrosis.  No signs of infection. -Tylenol as needed for pain - Advised for patient to follow-up with orthopedics.  Anxiety Endorses intermittent anxiety, no depression or suicidal ideation.  Anxiety seems to affect sleep, she takes hydroxyzine as needed at night to help with this. -Refilled hydroxyzine  Smitty Cords, MD [place], PGY-3

## 2017-12-04 NOTE — Telephone Encounter (Signed)
Dr Marlou Sa said can refill on Wednesday.

## 2017-12-04 NOTE — Telephone Encounter (Signed)
Ok to rf? 

## 2017-12-04 NOTE — Telephone Encounter (Signed)
Needs mri ok to rf pls clsl htx

## 2017-12-04 NOTE — Telephone Encounter (Signed)
Patient called needing Rx refilled (Hydrocodone) The number to contact patient is [phone]

## 2017-12-05 DIAGNOSIS — M79675 Pain in left toe(s): Secondary | ICD-10-CM | POA: Insufficient documentation

## 2017-12-05 DIAGNOSIS — F419 Anxiety disorder, unspecified: Secondary | ICD-10-CM | POA: Insufficient documentation

## 2017-12-05 NOTE — Assessment & Plan Note (Signed)
Endorses intermittent anxiety, no depression or suicidal ideation.  Anxiety seems to affect sleep, she takes hydroxyzine as needed at night to help with this. -Refilled hydroxyzine

## 2017-12-05 NOTE — Assessment & Plan Note (Signed)
Pain in the left fifth toe since March 28 after stubbing her toe on the stairs.  Went to the ED at that time and x-ray was unremarkable for any fracture.  Only abnormality seen on exam today is the left fifth toe does appear slightly more hyperpigmented compared to the rest of the digits and she does endorse pain with extension and flexion of her fifth digit.  She is neurovascularly intact, do not think that the hyperpigmentation is from necrosis.  No signs of infection. -Tylenol as needed for pain - Advised for patient to follow-up with orthopedics.

## 2017-12-06 ENCOUNTER — Other Ambulatory Visit (INDEPENDENT_AMBULATORY_CARE_PROVIDER_SITE_OTHER): Payer: Self-pay

## 2017-12-06 ENCOUNTER — Telehealth (INDEPENDENT_AMBULATORY_CARE_PROVIDER_SITE_OTHER): Payer: Self-pay | Admitting: Orthopedic Surgery

## 2017-12-06 DIAGNOSIS — Z1231 Encounter for screening mammogram for malignant neoplasm of breast: Secondary | ICD-10-CM | POA: Diagnosis not present

## 2017-12-06 DIAGNOSIS — M8589 Other specified disorders of bone density and structure, multiple sites: Secondary | ICD-10-CM | POA: Diagnosis not present

## 2017-12-06 DIAGNOSIS — M810 Age-related osteoporosis without current pathological fracture: Secondary | ICD-10-CM | POA: Diagnosis not present

## 2017-12-06 MED ORDER — HYDROCODONE-ACETAMINOPHEN 5-325 MG PO TABS: ORAL_TABLET | ORAL | 0 refills | Status: DC

## 2017-12-06 NOTE — Telephone Encounter (Signed)
Patient called stating went to Pharmacy to get medication and was told she needed authorization first.  Please call patient to advise.

## 2017-12-06 NOTE — Telephone Encounter (Signed)
I submitted online to [facility] was approved  PA# [id number] Conf# [id number] W

## 2017-12-06 NOTE — Telephone Encounter (Signed)
Tried calling patient. No answer. Left detailed VM advising that I have received all messages that pain medication needed to be prior authorized but we were in clinic and as soon as I got the opportunity to try and complete PA for her medication through Medicaid website would let her know.

## 2017-12-06 NOTE — Telephone Encounter (Signed)
Patient advised done.  

## 2017-12-06 NOTE — Telephone Encounter (Signed)
Patient called advised per the pharmacy the Rx need to be auth. before it can be filled. The number to contact patient is [phone]

## 2017-12-08 ENCOUNTER — Telehealth: Payer: Self-pay | Admitting: Family Medicine

## 2017-12-08 ENCOUNTER — Encounter: Payer: Self-pay | Admitting: Family Medicine

## 2017-12-08 DIAGNOSIS — M810 Age-related osteoporosis without current pathological fracture: Secondary | ICD-10-CM | POA: Insufficient documentation

## 2017-12-08 MED ORDER — ALENDRONATE SODIUM 5 MG PO TABS: 5.0000 mg | ORAL_TABLET | Freq: Every day | ORAL | 3 refills | Status: DC

## 2017-12-08 NOTE — Telephone Encounter (Signed)
Received DEXA scan results via fax. Patient's T-score is -2.9, she has osteoporosis. Called patient to discuss further, no answer. Left voicemail.   She will need daily bisphosphonate, can try Alendronate 5 mg daily. Will hold off on sending new prescription until I can speak with the patient.   Will scan results into chart.   Smitty Cords, MD [place], PGY-3

## 2017-12-09 ENCOUNTER — Ambulatory Visit
Admission: RE | Admit: 2017-12-09 | Discharge: 2017-12-09 | Disposition: A | Discharge status: Home / Self Care | Payer: Medicaid Other | Source: Ambulatory Visit | Attending: Orthopedic Surgery | Admitting: Orthopedic Surgery

## 2017-12-09 DIAGNOSIS — G8929 Other chronic pain: Secondary | ICD-10-CM

## 2017-12-09 DIAGNOSIS — M25511 Pain in right shoulder: Principal | ICD-10-CM

## 2017-12-12 ENCOUNTER — Encounter: Payer: Self-pay | Admitting: Family Medicine

## 2017-12-13 ENCOUNTER — Encounter (INDEPENDENT_AMBULATORY_CARE_PROVIDER_SITE_OTHER): Payer: Self-pay | Admitting: Orthopedic Surgery

## 2017-12-13 ENCOUNTER — Ambulatory Visit (INDEPENDENT_AMBULATORY_CARE_PROVIDER_SITE_OTHER): Payer: Medicaid Other | Admitting: Orthopedic Surgery

## 2017-12-13 DIAGNOSIS — M958 Other specified acquired deformities of musculoskeletal system: Secondary | ICD-10-CM

## 2017-12-13 NOTE — Progress Notes (Signed)
Office Visit Note   Patient: Annette Chang           Date of Birth: [birth year]           MRN: [id number] Visit Date: 12/13/2017 Requested by: Carlyle Dolly, MD [place], [place] [id number] PCP: Carlyle Dolly, MD  Subjective: Chief Complaint  Patient presents with  . Right Shoulder - Pain    HPI: Reveals a 47 year old patient with scapular winging.  Last year she had partial pectoralis major transfer.  She did well for a number of months however when she began to work again she developed recurrent winging.  She has since been diagnosed with osteoporosis.  Subsequent work-up including CT scan and MRI scan which I discussed with the radiologist today shows that the bone plug which was attached via suture in a side-to-side fashion to the inferior pole of the scapula has likely pulled away.  I do not think that the tendon has retracted.  She does report continued pain and symptoms.              ROS: All systems reviewed are negative as they relate to the chief complaint within the history of present illness.  Patient denies  fevers or chills.   Assessment & Plan: Visit Diagnoses:  1. Winged scapula of right side     Plan: Impression is scapular winging with prior history of pec transfer which appears to have become disconnected from the inferior aspect of the scapula.  Plan I discussed with [place] which would be observation versus revision surgery.  Importantly the pec tendon does appear to be still in the area of the scapula.  For that reason I think it is conceivable that it could be retrieved and reattached.  Would likely do that through an inferior drill hole and then sutured back to itself.  Risks and benefits of the procedure are discussed including not limited to failure as well as continued pain in the shoulder.  Patient understands the risk and benefits.  All questions answered.  Follow-Up Instructions: No follow-ups on file.   Orders:  No orders of  the defined types were placed in this encounter.  No orders of the defined types were placed in this encounter.     Procedures: No procedures performed   Clinical Data: No additional findings.  Objective: Vital Signs: There were no vitals taken for this visit.  Physical Exam:   Constitutional: Patient appears well-developed HEENT:  Head: Normocephalic Eyes:EOM are normal Neck: Normal range of motion Cardiovascular: Normal rate Pulmonary/chest: Effort normal Neurologic: Patient is alert Skin: Skin is warm Psychiatric: Patient has normal mood and affect    Ortho Exam: Orthopedic exam demonstrates scapular winging consistent with no functional serratus anterior muscle.  Patient is thin.  She has limited forward flexion and abduction due to instability of her scapular platform.  There is both a rotational component as well as distraction component of the scapular winging.  Specialty Comments:  No specialty comments available.  Imaging: No results found.   PMFS History: Patient Active Problem List   Diagnosis Date Noted  . Osteoporosis 12/08/2017  . Toe pain, left 12/05/2017  . Anxiety 12/05/2017  . 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 Suicidal ideations 03/30/2016  . MDD (major depressive disorder) 03/29/2016  . Breast pain 01/18/2016  . 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 Chronic urticaria 08/06/2015  . Paresthesia 04/15/2015  . Headache 02/20/2015  . 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  Right shoulder pain 08/22/2014  . DUB (dysfunctional uterine bleeding) 03/11/2014  . H/O rotator cuff surgery 01/02/2012  . 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 Iron deficiency anemia 01/02/2012   Past Medical History:  Diagnosis Date  . Anxiety   . 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 Depression   . H/O rotator cuff surgery 01/02/2012  . H/O tubal ligation 01/02/2012  . H/O: C-section 01/02/2012   3   . H/O: C-section 01/02/2012   3   .  Headache(784.0) 01/02/2012  . Iron deficiency anemia 01/02/2012  . 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 Diabetes Maternal Grandmother   . Diabetes Paternal Grandmother   . Diabetes Paternal Grandfather     Past Surgical History:  Procedure Laterality Date  . CESAREAN SECTION     3 previous c sections  . MUSCLE REPAIR Right 01/10/2017   RIGHT PECTORAL MAJOR TO SCAPULA MUSCLE TRANSFER (Right  . NOVASURE ABLATION N/A 03/11/2014   Procedure: NOVASURE ABLATION;  Surgeon: Emily Filbert, MD;  Location: [facility];  Service: Gynecology;  Laterality: N/A;  . PECTORALIS TENDON REPAIR Right 01/10/2017   Procedure: RIGHT PECTORAL MAJOR TO SCAPULA MUSCLE TRANSFER;  Surgeon: Meredith Pel, MD;  Location: [facility];  Service: Orthopedics;  Laterality: Right;  . rotator cuff surgery    . SHOULDER SURGERY    . TUBAL LIGATION     Social History   Occupational History  . Occupation: unemployed  Tobacco Use  . Smoking status: Current Some Day Smoker    Years: 5.00    Types: Cigarettes  . Smokeless tobacco: Never Used  Substance and Sexual Activity  . Alcohol use: Yes    Alcohol/week: 8.4 oz    Types: 14 Glasses of wine per week    Comment: weekends  . 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 Sexual activity: Not Currently    Birth control/protection: Surgical

## 2017-12-25 ENCOUNTER — Other Ambulatory Visit (INDEPENDENT_AMBULATORY_CARE_PROVIDER_SITE_OTHER): Payer: Self-pay | Admitting: Orthopedic Surgery

## 2017-12-25 DIAGNOSIS — M958 Other specified acquired deformities of musculoskeletal system: Secondary | ICD-10-CM

## 2017-12-25 NOTE — Pre-Procedure Instructions (Signed)
Annette Chang  12/25/2017      [facility] [place], [address] [place] [address] Phone: [phone] Fax: [phone]    Your procedure is scheduled on Thurs., December 28, 2017 from 12:15PM-2:15PM  Report to [facility] Admitting Entrance "A" at 10:15AM  Call this number if you have problems the morning of surgery:  [phone]   Remember:  No food or drinks after midnight on June 5th    Take these medicines the morning of surgery with A SIP OF WATER: If needed Ondansetron [facility]) for nausea, HYDROcodone-acetaminophen (NORCO/VICODIN) for pain, and HydrOXYzine (ATARAX/VISTARIL) for anxiety  As of today, stop taking all Other Aspirin Products, Vitamins, Fish oils, and Herbal medications. Also stop all NSAIDS i.e. Advil, Ibuprofen, Motrin, Aleve, Anaprox, Naproxen, BC, Goody Powders, and Supplements.    Do not wear jewelry, make-up or nail polish (fingers).  Do not wear lotions, powders, or perfumes, or deodorant.  Do not shave 48 hours prior to surgery.    Do not bring valuables to the hospital.  [facility] is not responsible for any belongings or valuables.  Contacts, dentures or bridgework may not be worn into surgery.  Leave your suitcase in the car.  After surgery it may be brought to your room.  For patients admitted to the hospital, discharge time will be determined by your treatment team.  Patients discharged the day of surgery will not be allowed to drive home.   Special instructions:   [place]- Preparing For Surgery  Before surgery, you can play an important role. Because skin is not sterile, your skin needs to be as free of germs as possible. You can reduce the number of germs on your skin by washing with CHG (chlorahexidine gluconate) Soap before surgery.  CHG is an antiseptic cleaner which kills germs and bonds with the skin to continue killing germs even  after washing.    Oral Hygiene is also important to reduce your risk of infection.  Remember - BRUSH YOUR TEETH THE MORNING OF SURGERY WITH YOUR REGULAR TOOTHPASTE  Please do not use if you have an allergy to CHG or antibacterial soaps. If your skin becomes reddened/irritated stop using the CHG.  Do not shave (including legs and underarms) for at least 48 hours prior to first CHG shower. It is OK to shave your face.  Please follow these instructions carefully.   1. Shower the NIGHT BEFORE SURGERY and the MORNING OF SURGERY with CHG.   2. If you chose to wash your hair, wash your hair first as usual with your normal shampoo.  3. After you shampoo, rinse your hair and body thoroughly to remove the shampoo.  4. Use CHG as you would any other liquid soap. You can apply CHG directly to the skin and wash gently with a scrungie or a clean washcloth.   5. Apply the CHG Soap to your body ONLY FROM THE NECK DOWN.  Do not use on open wounds or open sores. Avoid contact with your eyes, ears, mouth and genitals (private parts). Wash Face and genitals (private parts)  with your normal soap.  6. Wash thoroughly, paying special attention to the area where your surgery will be performed.  7. Thoroughly rinse your body with warm water from the neck down.  8. DO NOT shower/wash with your normal soap after using and rinsing off the CHG Soap.  9. Pat yourself  dry with a CLEAN TOWEL.  10. Wear CLEAN PAJAMAS to bed the night before surgery, wear comfortable clothes the morning of surgery  11. Place CLEAN SHEETS on your bed the night of your first shower and DO NOT SLEEP WITH PETS.  Day of Surgery:  Do not apply any deodorants/lotions.  Please wear clean clothes to the hospital/surgery center.   Remember to brush your teeth WITH YOUR REGULAR TOOTHPASTE.  Please read over the following fact sheets that you were given. Pain Booklet, Coughing and Deep Breathing and Surgical Site Infection  Prevention

## 2017-12-26 ENCOUNTER — Encounter (HOSPITAL_COMMUNITY): Payer: Self-pay

## 2017-12-26 ENCOUNTER — Encounter (HOSPITAL_COMMUNITY)
Admission: RE | Admit: 2017-12-26 | Discharge: 2017-12-26 | Disposition: A | Discharge status: Home / Self Care | Payer: Medicaid Other | Source: Ambulatory Visit | Attending: Orthopedic Surgery | Admitting: Orthopedic Surgery

## 2017-12-26 ENCOUNTER — Other Ambulatory Visit: Payer: Self-pay

## 2017-12-26 DIAGNOSIS — Z01812 Encounter for preprocedural laboratory examination: Secondary | ICD-10-CM | POA: Diagnosis present

## 2017-12-26 LAB — COMPREHENSIVE METABOLIC PANEL
ALT: 15 U/L (ref 14–54)
AST: 24 U/L (ref 15–41)
Albumin: 3.9 g/dL (ref 3.5–5.0)
Alkaline Phosphatase: 53 U/L (ref 38–126)
Anion gap: 9 (ref 5–15)
BILIRUBIN TOTAL: 0.5 mg/dL (ref 0.3–1.2)
BUN: 8 mg/dL (ref 6–20)
CHLORIDE: 107 mmol/L (ref 101–111)
CO2: 25 mmol/L (ref 22–32)
CREATININE: 0.57 mg/dL (ref 0.44–1.00)
Calcium: 8.7 mg/dL — ABNORMAL LOW (ref 8.9–10.3)
Glucose, Bld: 91 mg/dL (ref 65–99)
Potassium: 3.3 mmol/L — ABNORMAL LOW (ref 3.5–5.1)
Sodium: 141 mmol/L (ref 135–145)
TOTAL PROTEIN: 6.9 g/dL (ref 6.5–8.1)

## 2017-12-26 LAB — CBC
HEMATOCRIT: 36.8 % (ref 36.0–46.0)
Hemoglobin: 12.5 g/dL (ref 12.0–15.0)
MCH: 30.3 pg (ref 26.0–34.0)
MCHC: 34 g/dL (ref 30.0–36.0)
MCV: 89.3 fL (ref 78.0–100.0)
PLATELETS: 201 10*3/uL (ref 150–400)
RBC: 4.12 MIL/uL (ref 3.87–5.11)
RDW: 12.2 % (ref 11.5–15.5)
WBC: 4.4 10*3/uL (ref 4.0–10.5)

## 2017-12-26 NOTE — Progress Notes (Signed)
PCP - Dr. Smitty Cords  Cardiologist - Denies  Chest x-ray - Denies  EKG - Denies  Stress Test - Denies  ECHO - Denies  Cardiac Cath - Denies  Sleep Study - Denies CPAP - None  LABS- 12/26/17: CBC, CMP POC Upreg: 12/28/17  ASA-Denies  Anesthesia- No  Pt denies having chest pain, sob, or fever at this time. All instructions explained to the pt, with a verbal understanding of the material. Pt agrees to go over the instructions while at home for a better understanding. The opportunity to ask questions was provided.

## 2017-12-28 ENCOUNTER — Inpatient Hospital Stay (HOSPITAL_COMMUNITY): Payer: Medicaid Other | Admitting: Certified Registered Nurse Anesthetist

## 2017-12-28 ENCOUNTER — Encounter (HOSPITAL_COMMUNITY): Payer: Self-pay

## 2017-12-28 ENCOUNTER — Ambulatory Visit (HOSPITAL_COMMUNITY)
Admission: RE | Admit: 2017-12-28 | Discharge: 2017-12-28 | Disposition: A | Discharge status: Home / Self Care | Payer: Medicaid Other | Source: Ambulatory Visit | Attending: Orthopedic Surgery | Admitting: Orthopedic Surgery

## 2017-12-28 ENCOUNTER — Other Ambulatory Visit: Payer: Self-pay

## 2017-12-28 ENCOUNTER — Encounter (HOSPITAL_COMMUNITY)
Admission: RE | Disposition: A | Discharge status: Home / Self Care | Payer: Self-pay | Source: Ambulatory Visit | Attending: Orthopedic Surgery

## 2017-12-28 DIAGNOSIS — Z79899 Other long term (current) drug therapy: Secondary | ICD-10-CM | POA: Diagnosis not present

## 2017-12-28 DIAGNOSIS — D509 Iron deficiency anemia, unspecified: Secondary | ICD-10-CM | POA: Diagnosis not present

## 2017-12-28 DIAGNOSIS — M25511 Pain in right shoulder: Secondary | ICD-10-CM | POA: Diagnosis present

## 2017-12-28 DIAGNOSIS — F17210 Nicotine dependence, cigarettes, uncomplicated: Secondary | ICD-10-CM | POA: Diagnosis not present

## 2017-12-28 DIAGNOSIS — M958 Other specified acquired deformities of musculoskeletal system: Secondary | ICD-10-CM | POA: Diagnosis not present

## 2017-12-28 DIAGNOSIS — F419 Anxiety disorder, unspecified: Secondary | ICD-10-CM | POA: Diagnosis not present

## 2017-12-28 HISTORY — PX: PECTORALIS TENDON REPAIR: SHX6510

## 2017-12-28 LAB — POCT PREGNANCY, URINE: Preg Test, Ur: NEGATIVE

## 2017-12-28 SURGERY — REPAIR, TENDON, PECTORALIS
Anesthesia: General | Laterality: Right

## 2017-12-28 MED ORDER — FENTANYL CITRATE (PF) 250 MCG/5ML IJ SOLN
INTRAMUSCULAR | Status: AC
  Filled 2017-12-28: qty 5

## 2017-12-28 MED ORDER — CEFAZOLIN SODIUM-DEXTROSE 2-4 GM/100ML-% IV SOLN
2.0000 g | INTRAVENOUS | Status: AC
  Administered 2017-12-28: 2 g via INTRAVENOUS
  Filled 2017-12-28: qty 100

## 2017-12-28 MED ORDER — MORPHINE SULFATE (PF) 4 MG/ML IV SOLN
INTRAVENOUS | Status: DC | PRN
  Administered 2017-12-28: 8 mg via INTRAVENOUS

## 2017-12-28 MED ORDER — LACTATED RINGERS IV SOLN
INTRAVENOUS | Status: DC | PRN
  Administered 2017-12-28: 13:00:00 via INTRAVENOUS

## 2017-12-28 MED ORDER — SUGAMMADEX SODIUM 200 MG/2ML IV SOLN
INTRAVENOUS | Status: DC | PRN
  Administered 2017-12-28: 100 mg via INTRAVENOUS

## 2017-12-28 MED ORDER — MIDAZOLAM HCL 2 MG/2ML IJ SOLN
INTRAMUSCULAR | Status: AC
  Filled 2017-12-28: qty 2

## 2017-12-28 MED ORDER — LIDOCAINE HCL (CARDIAC) PF 100 MG/5ML IV SOSY
PREFILLED_SYRINGE | INTRAVENOUS | Status: DC | PRN
  Administered 2017-12-28: 60 mg via INTRAVENOUS

## 2017-12-28 MED ORDER — 0.9 % SODIUM CHLORIDE (POUR BTL) OPTIME
TOPICAL | Status: DC | PRN
  Administered 2017-12-28: 1000 mL

## 2017-12-28 MED ORDER — EPHEDRINE SULFATE 50 MG/ML IJ SOLN
INTRAMUSCULAR | Status: DC | PRN
  Administered 2017-12-28: 5 mg via INTRAVENOUS

## 2017-12-28 MED ORDER — HYDROCODONE-ACETAMINOPHEN 10-325 MG PO TABS
ORAL_TABLET | ORAL | Status: AC
  Filled 2017-12-28: qty 1

## 2017-12-28 MED ORDER — CHLORHEXIDINE GLUCONATE 4 % EX LIQD: 60.0000 mL | Freq: Once | CUTANEOUS | Status: DC

## 2017-12-28 MED ORDER — HYDROMORPHONE HCL 2 MG/ML IJ SOLN
0.3000 mg | INTRAMUSCULAR | Status: DC | PRN
  Administered 2017-12-28 (×2): 0.5 mg via INTRAVENOUS

## 2017-12-28 MED ORDER — DEXAMETHASONE SODIUM PHOSPHATE 10 MG/ML IJ SOLN
INTRAMUSCULAR | Status: DC | PRN
  Administered 2017-12-28: 5 mg via INTRAVENOUS

## 2017-12-28 MED ORDER — HYDROCODONE-ACETAMINOPHEN 10-325 MG PO TABS: 1.0000 | ORAL_TABLET | Freq: Four times a day (QID) | ORAL | 0 refills | Status: DC | PRN

## 2017-12-28 MED ORDER — CLONIDINE HCL (ANALGESIA) 100 MCG/ML EP SOLN
EPIDURAL | Status: DC | PRN
  Administered 2017-12-28: 50 ug

## 2017-12-28 MED ORDER — HYDROMORPHONE HCL 2 MG/ML IJ SOLN
INTRAMUSCULAR | Status: AC
  Filled 2017-12-28: qty 1

## 2017-12-28 MED ORDER — ONDANSETRON HCL 4 MG/2ML IJ SOLN
INTRAMUSCULAR | Status: DC | PRN
  Administered 2017-12-28: 4 mg via INTRAVENOUS

## 2017-12-28 MED ORDER — BUPIVACAINE HCL (PF) 0.25 % IJ SOLN
INTRAMUSCULAR | Status: DC | PRN
  Administered 2017-12-28: 30 mL

## 2017-12-28 MED ORDER — BUPIVACAINE HCL (PF) 0.25 % IJ SOLN
INTRAMUSCULAR | Status: AC
  Filled 2017-12-28: qty 30

## 2017-12-28 MED ORDER — FENTANYL CITRATE (PF) 100 MCG/2ML IJ SOLN
INTRAMUSCULAR | Status: DC | PRN
  Administered 2017-12-28 (×2): 50 ug via INTRAVENOUS
  Administered 2017-12-28: 25 ug via INTRAVENOUS
  Administered 2017-12-28: 50 ug via INTRAVENOUS
  Administered 2017-12-28: 25 ug via INTRAVENOUS
  Administered 2017-12-28: 50 ug via INTRAVENOUS

## 2017-12-28 MED ORDER — ROCURONIUM BROMIDE 100 MG/10ML IV SOLN
INTRAVENOUS | Status: DC | PRN
  Administered 2017-12-28: 50 mg via INTRAVENOUS

## 2017-12-28 MED ORDER — HYDROCODONE-ACETAMINOPHEN 10-325 MG PO TABS
1.0000 | ORAL_TABLET | Freq: Once | ORAL | Status: AC
  Administered 2017-12-28: 1 via ORAL

## 2017-12-28 MED ORDER — DEXAMETHASONE SODIUM PHOSPHATE 10 MG/ML IJ SOLN
INTRAMUSCULAR | Status: AC
  Filled 2017-12-28: qty 1

## 2017-12-28 MED ORDER — MIDAZOLAM HCL 5 MG/5ML IJ SOLN
INTRAMUSCULAR | Status: DC | PRN
  Administered 2017-12-28: 2 mg via INTRAVENOUS

## 2017-12-28 MED ORDER — PROPOFOL 10 MG/ML IV BOLUS
INTRAVENOUS | Status: DC | PRN
  Administered 2017-12-28: 150 mg via INTRAVENOUS

## 2017-12-28 MED ORDER — MORPHINE SULFATE (PF) 4 MG/ML IV SOLN
INTRAVENOUS | Status: AC
  Filled 2017-12-28: qty 2

## 2017-12-28 SURGICAL SUPPLY — 70 items
BANDAGE ACE 4X5 VEL STRL LF (GAUZE/BANDAGES/DRESSINGS) IMPLANT
BLADE CLIPPER SURG (BLADE) ×3 IMPLANT
BLADE SURG 10 STRL SS (BLADE) ×3 IMPLANT
BNDG CMPR 9X4 STRL LF SNTH (GAUZE/BANDAGES/DRESSINGS)
BNDG COHESIVE 4X5 TAN STRL (GAUZE/BANDAGES/DRESSINGS) ×3 IMPLANT
BNDG ESMARK 4X9 LF (GAUZE/BANDAGES/DRESSINGS) IMPLANT
BNDG GAUZE ELAST 4 BULKY (GAUZE/BANDAGES/DRESSINGS) IMPLANT
CARTRIDGE CURVETEK MED (MISCELLANEOUS) IMPLANT
CARTRIDGE CURVETEK XLRG (MISCELLANEOUS) IMPLANT
CLOSURE STERI-STRIP 1/2X4 (GAUZE/BANDAGES/DRESSINGS) ×1
CLOSURE WOUND 1/2 X4 (GAUZE/BANDAGES/DRESSINGS) ×1
CLSR STERI-STRIP ANTIMIC 1/2X4 (GAUZE/BANDAGES/DRESSINGS) ×1 IMPLANT
COVER SURGICAL LIGHT HANDLE (MISCELLANEOUS) ×3 IMPLANT
CUFF TOURNIQUET SINGLE 18IN (TOURNIQUET CUFF) ×3 IMPLANT
CUFF TOURNIQUET SINGLE 24IN (TOURNIQUET CUFF) IMPLANT
DECANTER SPIKE VIAL GLASS SM (MISCELLANEOUS) ×2 IMPLANT
DRAPE INCISE IOBAN 66X45 STRL (DRAPES) IMPLANT
DRAPE OEC MINIVIEW 54X84 (DRAPES) IMPLANT
DRAPE U-SHAPE 47X51 STRL (DRAPES) ×3 IMPLANT
DRSG AQUACEL AG ADV 3.5X 6 (GAUZE/BANDAGES/DRESSINGS) ×2 IMPLANT
DURAPREP 26ML APPLICATOR (WOUND CARE) ×3 IMPLANT
ELECT REM PT RETURN 9FT ADLT (ELECTROSURGICAL) ×3
ELECTRODE REM PT RTRN 9FT ADLT (ELECTROSURGICAL) ×1 IMPLANT
FACESHIELD WRAPAROUND (MASK) ×3 IMPLANT
FACESHIELD WRAPAROUND OR TEAM (MASK) ×1 IMPLANT
GAUZE SPONGE 4X4 12PLY STRL (GAUZE/BANDAGES/DRESSINGS) IMPLANT
GAUZE XEROFORM 1X8 LF (GAUZE/BANDAGES/DRESSINGS) IMPLANT
GLOVE BIO SURGEON ST LM GN SZ9 (GLOVE) ×3 IMPLANT
GLOVE BIOGEL PI IND STRL 8 (GLOVE) ×1 IMPLANT
GLOVE BIOGEL PI IND STRL 9 (GLOVE) ×1 IMPLANT
GLOVE BIOGEL PI INDICATOR 8 (GLOVE) ×2
GLOVE BIOGEL PI INDICATOR 9 (GLOVE) ×2
GLOVE SURG ORTHO 8.0 STRL STRW (GLOVE) ×3 IMPLANT
GOWN STRL REUS W/ TWL LRG LVL3 (GOWN DISPOSABLE) ×2 IMPLANT
GOWN STRL REUS W/ TWL XL LVL3 (GOWN DISPOSABLE) ×1 IMPLANT
GOWN STRL REUS W/TWL LRG LVL3 (GOWN DISPOSABLE) ×6
GOWN STRL REUS W/TWL XL LVL3 (GOWN DISPOSABLE) ×3
KIT BASIN OR (CUSTOM PROCEDURE TRAY) ×3 IMPLANT
KIT TURNOVER KIT B (KITS) ×3 IMPLANT
MANIFOLD NEPTUNE II (INSTRUMENTS) ×3 IMPLANT
NDL SUT 6 .5 CRC .975X.05 MAYO (NEEDLE) ×1 IMPLANT
NEEDLE 22X1 1/2 (OR ONLY) (NEEDLE) IMPLANT
NEEDLE MAYO TAPER (NEEDLE) ×3
NS IRRIG 1000ML POUR BTL (IV SOLUTION) ×3 IMPLANT
PACK ORTHO EXTREMITY (CUSTOM PROCEDURE TRAY) ×3 IMPLANT
PAD ARMBOARD 7.5X6 YLW CONV (MISCELLANEOUS) ×6 IMPLANT
PAD CAST 3X4 CTTN HI CHSV (CAST SUPPLIES) IMPLANT
PADDING CAST COTTON 3X4 STRL (CAST SUPPLIES)
PASSER SUT SWANSON 36MM LOOP (INSTRUMENTS) ×3 IMPLANT
SLING ARM IMMOBILIZER MED (SOFTGOODS) ×2 IMPLANT
SPONGE LAP 4X18 RFD (DISPOSABLE) ×6 IMPLANT
STAPLER VISISTAT 35W (STAPLE) IMPLANT
STOCKINETTE IMPERVIOUS 9X36 MD (GAUZE/BANDAGES/DRESSINGS) ×3 IMPLANT
STRIP CLOSURE SKIN 1/2X4 (GAUZE/BANDAGES/DRESSINGS) ×2 IMPLANT
SUCTION FRAZIER HANDLE 10FR (MISCELLANEOUS) ×2
SUCTION TUBE FRAZIER 10FR DISP (MISCELLANEOUS) ×1 IMPLANT
SUT 2 FIBERLOOP 20 STRT BLUE (SUTURE) ×6
SUT FIBERWIRE #2 38 T-5 BLUE (SUTURE) ×9
SUT PROLENE 3 0 PS 1 (SUTURE) IMPLANT
SUT VIC AB 2-0 CTB1 (SUTURE) IMPLANT
SUT VIC AB 3-0 X1 27 (SUTURE) IMPLANT
SUTURE 2 FIBERLOOP 20 STRT BLU (SUTURE) IMPLANT
SUTURE FIBERWR #2 38 T-5 BLUE (SUTURE) ×2 IMPLANT
SYR CONTROL 10ML LL (SYRINGE) IMPLANT
TOWEL OR 17X24 6PK STRL BLUE (TOWEL DISPOSABLE) ×3 IMPLANT
TOWEL OR 17X26 10 PK STRL BLUE (TOWEL DISPOSABLE) ×3 IMPLANT
TUBE CONNECTING 12'X1/4 (SUCTIONS) ×1
TUBE CONNECTING 12X1/4 (SUCTIONS) ×2 IMPLANT
WATER STERILE IRR 1000ML POUR (IV SOLUTION) ×3 IMPLANT
YANKAUER SUCT BULB TIP NO VENT (SUCTIONS) IMPLANT

## 2017-12-28 NOTE — Brief Op Note (Signed)
12/28/2017  5:01 PM  PATIENT:  Annette Chang  47 y.o. female  PRE-OPERATIVE DIAGNOSIS:  right scapular winging  POST-OPERATIVE DIAGNOSIS:  right scapular winging  PROCEDURE:  Procedure(s): RIGHT REVISION TENDON TRANSFER OF PECTORALIS MAJOR  SURGEON:  Surgeon(s): Marlou Sa, Tonna Corner, MD  ASSISTANT: Laure Kidney rnfa  ANESTHESIA:   general  EBL: 50 ml    Total I/O In: 800 [I.V.:800] Out: 50 [Blood:50]  BLOOD ADMINISTERED: none  DRAINS: none   LOCAL MEDICATIONS USED:  Marcaine mso4 clonidinee  SPECIMEN:  No Specimen  COUNTS:  YES  TOURNIQUET:  * No tourniquets in log *  DICTATION: .Other Dictation: Dictation Number [id number] PLAN OF CARE: Discharge to home after PACU  PATIENT DISPOSITION:  PACU - hemodynamically stable

## 2017-12-28 NOTE — Anesthesia Postprocedure Evaluation (Signed)
Anesthesia Post Note  Patient: Annette Chang  Procedure(s) Performed: RIGHT REVISION TENDON TRANSFER OF PECTORALIS MAJOR (Right )     Patient location during evaluation: PACU Anesthesia Type: General Level of consciousness: awake and alert Pain management: pain level controlled Vital Signs Assessment: post-procedure vital signs reviewed and stable Respiratory status: spontaneous breathing, nonlabored ventilation and respiratory function stable Cardiovascular status: blood pressure returned to baseline and stable Postop Assessment: no apparent nausea or vomiting Anesthetic complications: no    Last Vitals:  Vitals:   12/28/17 1730 12/28/17 1732  BP:  (!) 137/97  Pulse: (!) 57 60  Resp: 10 11  Temp:    SpO2: 99% 99%    Last Pain:  Vitals:   12/28/17 1733  TempSrc:   PainSc: 9                  Elizibeth Breau,W. EDMOND

## 2017-12-28 NOTE — Anesthesia Preprocedure Evaluation (Signed)
Anesthesia Evaluation  Patient identified by MRN, date of birth, ID band Patient awake    Reviewed: Allergy & Precautions, NPO status , Patient's Chart, lab work & pertinent test results  History of Anesthesia Complications Negative for: history of anesthetic complications  Airway Mallampati: I  TM Distance: >3 FB Neck ROM: Full    Dental  (+) Teeth Intact, Dental Advisory Given   Pulmonary Current Smoker,    breath sounds clear to auscultation       Cardiovascular negative cardio ROS   Rhythm:Regular Rate:Normal     Neuro/Psych PSYCHIATRIC DISORDERS Anxiety Depression  Neuromuscular disease    GI/Hepatic negative GI ROS, Neg liver ROS,   Endo/Other    Renal/GU negative Renal ROS     Musculoskeletal  (+) Arthritis ,   Abdominal   Peds  Hematology   Anesthesia Other Findings   Reproductive/Obstetrics                             Anesthesia Physical  Anesthesia Plan  ASA: II  Anesthesia Plan: General   Post-op Pain Management:    Induction: Intravenous  PONV Risk Score and Plan: 3 and Ondansetron, Dexamethasone and Scopolamine patch - Pre-op  Airway Management Planned: Oral ETT  Additional Equipment:   Intra-op Plan:   Post-operative Plan: Extubation in OR  Informed Consent: I have reviewed the patients History and Physical, chart, labs and discussed the procedure including the risks, benefits and alternatives for the proposed anesthesia with the patient or authorized representative who has indicated his/her understanding and acceptance.   Dental advisory given  Plan Discussed with: CRNA and Anesthesiologist  Anesthesia Plan Comments:         Anesthesia Quick Evaluation

## 2017-12-28 NOTE — Anesthesia Procedure Notes (Signed)
Procedure Name: Intubation Date/Time: 12/28/2017 1:17 PM Performed by: Candis Shine, CRNA Pre-anesthesia Checklist: Patient identified, Emergency Drugs available, Suction available and Patient being monitored Patient Re-evaluated:Patient Re-evaluated prior to induction Oxygen Delivery Method: Circle System Utilized Preoxygenation: Pre-oxygenation with 100% oxygen Induction Type: IV induction Ventilation: Mask ventilation without difficulty Laryngoscope Size: Mac and 3 Grade View: Grade I Tube type: Oral Tube size: 7.0 mm Number of attempts: 1 Airway Equipment and Method: Stylet Placement Confirmation: ETT inserted through vocal cords under direct vision,  positive ETCO2 and breath sounds checked- equal and bilateral Secured at: 22 cm Tube secured with: Tape Dental Injury: Teeth and Oropharynx as per pre-operative assessment

## 2017-12-28 NOTE — H&P (Signed)
Annette Chang is an 47 y.o. female.   Chief Complaint: Right shoulder pain HPI: Annette Chang is a 47 year old patient who is over a year out from sternal head pectoralis major muscle transfer to inferior border of the scapula for serratus anterior palsy and scapular winging.  She did well for a period of time and then has had recurrence of symptoms and winging.  Subsequent work-up has demonstrated likely functional loss due to pulling away of that bone plug attachment to the inferior border of the scapula.  She reports continued disability in the right shoulder.  Presents now for surgical management after explanation of risks and benefits.  She describes pain with ADLs as well as difficulty with holding down any type of work due to the shoulder pain.  Past Medical History:  Diagnosis Date  . Anxiety   . 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 Depression   . H/O rotator cuff surgery 01/02/2012  . H/O tubal ligation 01/02/2012  . H/O: C-section 01/02/2012   3   . H/O: C-section 01/02/2012   3   . Headache(784.0) 01/02/2012  . Iron deficiency anemia 01/02/2012  . 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 CESAREAN SECTION     3 previous c sections  . MUSCLE REPAIR Right 01/10/2017   RIGHT PECTORAL MAJOR TO SCAPULA MUSCLE TRANSFER (Right  . NOVASURE ABLATION N/A 03/11/2014   Procedure: NOVASURE ABLATION;  Surgeon: Emily Filbert, MD;  Location: [facility];  Service: Gynecology;  Laterality: N/A;  . PECTORALIS TENDON REPAIR Right 01/10/2017   Procedure: RIGHT PECTORAL MAJOR TO SCAPULA MUSCLE TRANSFER;  Surgeon: Meredith Pel, MD;  Location: [facility];  Service: Orthopedics;  Laterality: Right;  . rotator cuff surgery    . SHOULDER SURGERY    . TUBAL LIGATION      Family History  Problem Relation Age of Onset  . Diabetes Maternal Grandmother   . Diabetes Paternal Grandmother   . Diabetes Paternal Grandfather    Social History:   reports that she has been smoking cigarettes.  She has smoked for the past 5.00 years. She has never used smokeless tobacco. She reports that she drinks about 8.4 oz of alcohol per week. She reports that she has current or past drug history.  Allergies:  Allergies  Allergen Reactions  . Percocet [Oxycodone-Acetaminophen] Itching  . Robaxin [Methocarbamol] Rash    Medications Prior to Admission  Medication Sig Dispense Refill  . Calcium-Vitamin D-Vitamin K [id number] MG-UNT-MCG CHEW Chew 1 tablet by mouth daily.    . cholecalciferol (VITAMIN D) 1000 units tablet Take 1,000 Units by mouth daily.    . ferrous sulfate 325 (65 FE) MG EC tablet Take 325 mg by mouth daily with breakfast.    . HYDROcodone-acetaminophen (NORCO/VICODIN) 5-325 MG tablet 1 po q d prn pain (Patient taking differently: Take 1 tablet by mouth every 6 (six) hours as needed. 1 po q d prn pain) 25 tablet 0  . hydrOXYzine (ATARAX/VISTARIL) 25 MG tablet Take 1 tablet (25 mg total) by mouth daily as needed for anxiety. 30 tablet 0  . naproxen sodium (ANAPROX) 220 MG tablet Take 220 mg by mouth 2 (two) times daily as needed (pain).    . Vitamin A 8000 units TABS Take 1 capsule by mouth daily.    . vitamin C (ASCORBIC ACID) 500 MG tablet Take 500 mg by mouth daily.    . ondansetron (ZOFRAN)  4 MG tablet Take 1 tablet (4 mg total) by mouth every 6 (six) hours as needed for nausea. 20 tablet 0    Results for orders placed or performed during the hospital encounter of 12/28/17 (from the past 48 hour(s))  Pregnancy, urine POC     Status: None   Collection Time: 12/28/17 10:26 AM  Result Value Ref Range   Preg Test, Ur NEGATIVE NEGATIVE    Comment:        THE SENSITIVITY OF THIS METHODOLOGY IS >24 mIU/mL    No results found.  Review of Systems  Musculoskeletal: Positive for joint pain.  All other systems reviewed and are negative.   Blood pressure (!) 141/90, pulse (!) 54, temperature 98.1 F (36.7 C), temperature source  Oral, resp. rate 18, height 5\' 1"  (1.549 m), weight 90 lb 8 oz (41.1 kg), SpO2 100 %. Physical Exam  Constitutional: She appears well-developed.  HENT:  Head: Normocephalic.  Eyes: Pupils are equal, round, and reactive to light.  Neck: Normal range of motion.  Cardiovascular: Normal rate.  Respiratory: Effort normal.  Neurological: She is alert.  Skin: Skin is warm.  Psychiatric: She has a normal mood and affect.  Examination of the right shoulder demonstrates diminished forward flexion and abduction strength.  She does have scapular winging consistent with serratus anterior palsy with superior migration of the superior medial border of the scapula with attempted motion.  There is some improvement with manual stabilization of the scapula.  Motor or sensory function to the hand is intact.  No scapular winging on the left side.  Neck range of motion intact.  Assessment/Plan Impression is likely stretching of previous pectoralis major sternal head transfer to scapular procedure.  Patient does have a history of osteoporosis.  It appears on imaging studies including the CT scan and the MRI scan and after lengthy discussion with radiologist regarding interpretation of those studies that the tendon itself has not retracted.  Functionally she did well for 6 to 12 months but then has had recurrence of winging and shoulder disability.  I think it is likely that the tendon transfer with associated bone plug has become detached from the inferior portion of the scapula but has not retracted.  Surgical plan at this time would be exploration of the inferior border of the scapula with possible revision surgery including mobilization of the transfer and a through and through attachment to the inferior border of the scapula.  The risk and benefits of this are discussed with the patient including but not limited to infection nerve vessel damage failure of this repair as well as potential for scapular body injury if the  force applied to the inferior border of the scapula is too much for the bone to absorb.  Patient understands the risk and benefits and wishes to proceed.  All questions answered.  Scapulothoracic fusion would be the next option which is not a great option.  Anderson Malta, MD 12/28/2017, 11:45 AM

## 2017-12-28 NOTE — Transfer of Care (Signed)
Immediate Anesthesia Transfer of Care Note  Patient: Annette Chang  Procedure(s) Performed: RIGHT REVISION TENDON TRANSFER OF PECTORALIS MAJOR (Right )  Patient Location: PACU  Anesthesia Type:General  Level of Consciousness: awake, alert  and oriented  Airway & Oxygen Therapy: Patient Spontanous Breathing and Patient connected to nasal cannula oxygen  Post-op Assessment: Report given to RN and Post -op Vital signs reviewed and stable  Post vital signs: Reviewed and stable  Last Vitals:  Vitals Value Taken Time  BP 151/97 12/28/2017  4:36 PM  Temp    Pulse 65 12/28/2017  4:37 PM  Resp 11 12/28/2017  4:37 PM  SpO2 100 % 12/28/2017  4:37 PM  Vitals shown include unvalidated device data.  Last Pain:  Vitals:   12/28/17 1040  TempSrc: Oral  PainSc:       Patients Stated Pain Goal: 3 (55/[id number]/41 [id number])  Complications: No apparent anesthesia complications

## 2017-12-28 NOTE — Op Note (Signed)
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  PROCEDURE:  Revision tendon transfer pectoralis major tendon to the scapula.  SURGEON ATTENDING:  Meredith Pel, MD.  ASSISTANT:   Laure Kidney, RNFA  HISTORY OF PRESENT ILLNESS:  Annette Chang is a 47 year old patient who underwent a pectoralis major tendon transfer for scapular winging due to serratus anterior palsy.  This happened about a year ago.  She did well for approximately 6 months and then had  recurrence of her winging.  Subsequent workup including CT and MRI scan demonstrated a pulling away of the bone block from the inferior scapula.  She presents now for operative management after explanation of risks and benefits.  PROCEDURE IN DETAIL:  The patient was brought to the operating room where general anesthetic was induced.  Preoperative antibiotics were administered.  Timeout was called.  The patient was placed in lateral position and the right shoulder, arm and hand  were scrubbed with alcohol, Betadine and allowed to air dry.  Prepped with DuraPrep solution and draped in a sterile manner.  [name] was used to cover the entire operative field.  Prior incision over the lateral aspect of the scapula was made.  Skin and  subcutaneous tissue were sharply divided.  Careful dissection was taken down to the lateral inferior border of the scapula.  Prior suture holes through which the bone block was attached were noted to have had the sutures pulled through.  The sutures were  now more lateral to the bone block.  At this time, careful dissection was performed in order to mobilize the  tendon.  This was done off of the lateral aspect of the inferior portion of the scapula.  Once that mobilization was performed, the tendon  itself was short.  Some debulking of the muscle was required in order to perform a revision procedure.  It was elected at this time in order to shorten the functional length of that transferred muscle to make it more stable to drill a 10 mm hole at the  inferior corner of the scapula.  This is between the lateral pillar and inferior pillar of bone.  A 10 mm drill was utilized and the tendon was then passed from ventral to dorsal.  Then, with the scapula reduced and max tension applied, the tendon was  sutured to itself using FiberWire suture.  This gave a stable platform for the scapula to operate from.  Soft tissue which was removed in subperiosteal fashion from the scapula was then reinforced in pants-over-vest fashion to reinforce that repair and  keep the scapula in the protracted position.  A thorough irrigation was performed.  Skin edges were anesthetized using Marcaine, morphine, clonidine.  The incision was then closed using 0 Vicryl suture, 2-0 Vicryl suture and a 3-0 Monocryl.  Aquacel  dressing placed along with a shoulder immobilizer.  The patient tolerated the procedure well without immediate complications.  She was transferred to the recovery room in stable condition.  TN/NUANCE  D:12/28/2017 T:12/28/2017 JOB:[id number]

## 2017-12-29 ENCOUNTER — Encounter (HOSPITAL_COMMUNITY): Payer: Self-pay | Admitting: Orthopedic Surgery

## 2018-01-03 ENCOUNTER — Telehealth (INDEPENDENT_AMBULATORY_CARE_PROVIDER_SITE_OTHER): Payer: Self-pay | Admitting: Orthopedic Surgery

## 2018-01-03 NOTE — Telephone Encounter (Signed)
Patient called asked if Dr Marlou Sa can write a letter for her to get assistance to help her with her bills.  The letter is for the Department of Social Services. The number to contact patient is [phone]

## 2018-01-03 NOTE — Telephone Encounter (Signed)
IC advised could pick up at front desk.  

## 2018-01-03 NOTE — Telephone Encounter (Signed)
Okay for this letter stating that patient had major shoulder surgery and will be unable to work for at least 4 months.

## 2018-01-03 NOTE — Telephone Encounter (Signed)
Please advise. Thanks.  

## 2018-01-08 ENCOUNTER — Encounter (INDEPENDENT_AMBULATORY_CARE_PROVIDER_SITE_OTHER): Payer: Self-pay | Admitting: Orthopedic Surgery

## 2018-01-08 ENCOUNTER — Ambulatory Visit (INDEPENDENT_AMBULATORY_CARE_PROVIDER_SITE_OTHER): Payer: Medicaid Other | Admitting: Orthopedic Surgery

## 2018-01-08 DIAGNOSIS — M25511 Pain in right shoulder: Secondary | ICD-10-CM

## 2018-01-08 DIAGNOSIS — M958 Other specified acquired deformities of musculoskeletal system: Secondary | ICD-10-CM

## 2018-01-08 MED ORDER — HYDROCODONE-ACETAMINOPHEN 10-325 MG PO TABS: ORAL_TABLET | ORAL | 0 refills | Status: DC

## 2018-01-08 MED ORDER — METHOCARBAMOL 500 MG PO TABS: 500.0000 mg | ORAL_TABLET | Freq: Three times a day (TID) | ORAL | 0 refills | Status: DC | PRN

## 2018-01-13 ENCOUNTER — Encounter (INDEPENDENT_AMBULATORY_CARE_PROVIDER_SITE_OTHER): Payer: Self-pay | Admitting: Orthopedic Surgery

## 2018-01-13 NOTE — Progress Notes (Signed)
Post-Op Visit Note   Patient: Annette Chang           Date of Birth: 10/25/1970           MRN: [id number] Visit Date: 01/08/2018 PCP: Carlyle Dolly, MD   Assessment & Plan:  Chief Complaint:  Chief Complaint  Patient presents with  . Right Shoulder - Routine Post Op   Visit Diagnoses:  1. Winged scapula of right side     Plan: [name] is a patient who had revision of tendon transfer of pectoralis major to her scapula.  The bone plug had pulled away as the suture and cut through the bone.  She does have a history of osteoporosis.  This was revised with retensioning and placement of the tendon through the bone hole itself in the inferior aspect of the scapula.  This was a salvage procedure.  She is taking oxycodone and Robaxin.  On examination the scapula appears to be in reasonable position.  I am skin to sit on this for another 5 weeks.  Continue in the sling for 5 weeks.  No lifting or stretching or really anything until this tendon heals into the inferior pole of the scapula.  I will see her back in 6 weeks for clinical recheck  Follow-Up Instructions: No follow-ups on file.   Orders:  No orders of the defined types were placed in this encounter.  Meds ordered this encounter  Medications  . methocarbamol (ROBAXIN) 500 MG tablet    Sig: Take 1 tablet (500 mg total) by mouth every 8 (eight) hours as needed for muscle spasms.    Dispense:  35 tablet    Refill:  0  . HYDROcodone-acetaminophen (NORCO) 10-325 MG tablet    Sig: 1 po q 8-12hrs prn pain    Dispense:  60 tablet    Refill:  0    Imaging: No results found.  PMFS History: Patient Active Problem List   Diagnosis Date Noted  . Osteoporosis 12/08/2017  . Toe pain, left 12/05/2017  . Anxiety 12/05/2017  . 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 Suicidal ideations 03/30/2016  . MDD (major depressive disorder) 03/29/2016  . Breast pain 01/18/2016  . 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 Chronic urticaria  08/06/2015  . Paresthesia 04/15/2015  . Headache 02/20/2015  . 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 Right shoulder pain 08/22/2014  . DUB (dysfunctional uterine bleeding) 03/11/2014  . H/O rotator cuff surgery 01/02/2012  . 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 Iron deficiency anemia 01/02/2012   Past Medical History:  Diagnosis Date  . Anxiety   . 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 Depression   . H/O rotator cuff surgery 01/02/2012  . H/O tubal ligation 01/02/2012  . H/O: C-section 01/02/2012   3   . H/O: C-section 01/02/2012   3   . Headache(784.0) 01/02/2012  . Iron deficiency anemia 01/02/2012  . 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 Diabetes Maternal Grandmother   . Diabetes Paternal Grandmother   . Diabetes Paternal Grandfather     Past Surgical History:  Procedure Laterality Date  . CESAREAN SECTION     3 previous c sections  . MUSCLE REPAIR Right 01/10/2017   RIGHT PECTORAL MAJOR TO SCAPULA MUSCLE TRANSFER (Right  . NOVASURE ABLATION N/A 03/11/2014   Procedure: NOVASURE ABLATION;  Surgeon: Emily Filbert, MD;  Location: [facility]  [facility];  Service: Gynecology;  Laterality: N/A;  . PECTORALIS TENDON REPAIR Right 01/10/2017   Procedure: RIGHT PECTORAL MAJOR TO SCAPULA MUSCLE TRANSFER;  Surgeon: Meredith Pel, MD;  Location: [facility];  Service: Orthopedics;  Laterality: Right;  . PECTORALIS TENDON REPAIR Right 12/28/2017   Procedure: RIGHT REVISION TENDON TRANSFER OF PECTORALIS MAJOR;  Surgeon: Meredith Pel, MD;  Location: [place];  Service: Orthopedics;  Laterality: Right;  . rotator cuff surgery    . SHOULDER SURGERY    . TUBAL LIGATION     Social History   Occupational History  . Occupation: unemployed  Tobacco Use  . Smoking status: Current Some Day Smoker    Years: 5.00    Types: Cigarettes  . Smokeless tobacco: Never Used  Substance and Sexual Activity  . Alcohol use: Yes     Alcohol/week: 8.4 oz    Types: 14 Glasses of wine per week    Comment: weekends  . 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 Sexual activity: Not Currently    Birth control/protection: Surgical

## 2018-01-23 ENCOUNTER — Other Ambulatory Visit (INDEPENDENT_AMBULATORY_CARE_PROVIDER_SITE_OTHER): Payer: Self-pay | Admitting: Orthopedic Surgery

## 2018-01-23 MED ORDER — METHOCARBAMOL 500 MG PO TABS: 500.0000 mg | ORAL_TABLET | Freq: Three times a day (TID) | ORAL | 0 refills | Status: DC | PRN

## 2018-01-23 NOTE — Telephone Encounter (Signed)
IC s/w patient and advised. She verbalized understanding. I sent in refill for robaxin. She will call on Friday to get refill.

## 2018-01-23 NOTE — Telephone Encounter (Signed)
Y to rf  she should stay in sling and keep f/u appt

## 2018-01-23 NOTE — Telephone Encounter (Signed)
Patient called needing Rx refilled (Hydrocodone and Methocarbamol). Patient asked if Dr Annette Chang can write an order for her to have a nurse to come out to help her. Patient said her shoulder staring to hurt and a bone is sticking out more like her right shoulder did. The number to contact patient is [phone]

## 2018-01-23 NOTE — Telephone Encounter (Signed)
Per Dr Marlou Sa too soon to fill. Given #60 on 06/17. Per Dr Marlou Sa could fill on 01/28/18. Will call to advise.

## 2018-01-23 NOTE — Telephone Encounter (Signed)
Ok to refill medication? Do you want her to come in for recheck if increased pain and bone sticking out??

## 2018-01-26 ENCOUNTER — Telehealth (INDEPENDENT_AMBULATORY_CARE_PROVIDER_SITE_OTHER): Payer: Self-pay

## 2018-01-26 MED ORDER — HYDROCODONE-ACETAMINOPHEN 10-325 MG PO TABS: ORAL_TABLET | ORAL | 0 refills | Status: DC

## 2018-01-26 NOTE — Telephone Encounter (Signed)
Patient came in to office to have sling adjusted. OK to refill hydrocodone per Dr. Marlou Sa.  Script printed and given to patient.

## 2018-01-26 NOTE — Telephone Encounter (Signed)
Patient came in to get an adjustment on her sling (gave her a new one, as hers was falling apart).  She asked about the Rx for Hydrocodone - could she get it since she was here.  Dr. Orvilla Fus this and Rx was given to the patient.

## 2018-01-26 NOTE — Addendum Note (Signed)
Addended by: Meyer Cory on: 01/26/2018 09:33 AM   Modules accepted: Orders

## 2018-01-26 NOTE — Telephone Encounter (Signed)
Patient called to get refill. Patients # [phone]

## 2018-01-29 NOTE — Telephone Encounter (Signed)
Patient states pharmacy needs prior authorization for hydrocodone. Patients # [phone]

## 2018-01-29 NOTE — Telephone Encounter (Signed)
IC patient advised I had submitted to [facility] website and request for prior auth had been suspended for further review.

## 2018-01-29 NOTE — Telephone Encounter (Signed)
Will check on this tomorrow Con# [phone]

## 2018-01-30 ENCOUNTER — Telehealth (INDEPENDENT_AMBULATORY_CARE_PROVIDER_SITE_OTHER): Payer: Self-pay | Admitting: Orthopedic Surgery

## 2018-01-30 NOTE — Telephone Encounter (Signed)
Patient called asked for a call back concerning her Rx for (Hydrocodone)  Patient said the pharmacy said they have not received the prior auth yet. The number to contact patient is  [phone]

## 2018-01-30 NOTE — Telephone Encounter (Signed)
I checked [employer] [employer] website for update on PA for patients pain medication, and states that PA was denied. IC patient LM for her advising of denial.  Website does not give [place] reason for denial.

## 2018-01-30 NOTE — Telephone Encounter (Signed)
IC patient advised of message from yesterday, working on PA for her. Waiting to hear back from insurance on whether or not they will authorize medication.

## 2018-02-28 ENCOUNTER — Telehealth (INDEPENDENT_AMBULATORY_CARE_PROVIDER_SITE_OTHER): Payer: Self-pay | Admitting: Orthopedic Surgery

## 2018-02-28 MED ORDER — METHOCARBAMOL 500 MG PO TABS: 500.0000 mg | ORAL_TABLET | Freq: Three times a day (TID) | ORAL | 0 refills | Status: DC | PRN

## 2018-02-28 NOTE — Telephone Encounter (Signed)
sure

## 2018-02-28 NOTE — Telephone Encounter (Signed)
Patient called wanting t know if she can get (ROBAXIN) And checking status on Hydrocodone.If not approved she needs something for pain.  Please call patient to advise. [phone]

## 2018-02-28 NOTE — Telephone Encounter (Signed)
I sent in refill for robaxin. Will call patient about Norco. Last rx denied by Medicaid. Will discuss with patient.

## 2018-02-28 NOTE — Telephone Encounter (Signed)
I s/w patient and advised rx sent. In regards to [facility], I advised had been denied back in July. Patient stated she was aware of that and was just needing the robaxin. She will call back if needed.

## 2018-02-28 NOTE — Telephone Encounter (Signed)
[place] for robaxin? I will check on status of norco.

## 2018-03-07 ENCOUNTER — Ambulatory Visit (INDEPENDENT_AMBULATORY_CARE_PROVIDER_SITE_OTHER): Payer: Medicaid Other | Admitting: Orthopedic Surgery

## 2018-03-13 ENCOUNTER — Other Ambulatory Visit: Payer: Self-pay

## 2018-03-13 ENCOUNTER — Ambulatory Visit (INDEPENDENT_AMBULATORY_CARE_PROVIDER_SITE_OTHER): Payer: Medicaid Other | Admitting: Family Medicine

## 2018-03-13 ENCOUNTER — Ambulatory Visit: Payer: Self-pay | Admitting: Family Medicine

## 2018-03-13 ENCOUNTER — Encounter: Payer: Self-pay | Admitting: Family Medicine

## 2018-03-13 VITALS — BP 122/80 | HR 104 | Temp 98.5°F | Ht 61.0 in | Wt 91.0 lb

## 2018-03-13 DIAGNOSIS — F331 Major depressive disorder, recurrent, moderate: Secondary | ICD-10-CM

## 2018-03-13 DIAGNOSIS — G44209 Tension-type headache, unspecified, not intractable: Secondary | ICD-10-CM

## 2018-03-13 MED ORDER — FERROUS SULFATE 325 (65 FE) MG PO TBEC: 325.0000 mg | DELAYED_RELEASE_TABLET | Freq: Every day | ORAL | 3 refills | Status: DC

## 2018-03-13 MED ORDER — SERTRALINE HCL 50 MG PO TABS: 50.0000 mg | ORAL_TABLET | Freq: Every day | ORAL | 3 refills | Status: DC

## 2018-03-13 NOTE — Patient Instructions (Addendum)
It was great to meet you today! Thank you for letting me participate in your care!  Today, we discussed your current headache symptoms that I believe are due to a tension headache. You can continue taking Alleve and Tylenol as needed.   For your current mood and stress I am starting you on a medication called Sertraline, also known as Zoloft. It should help you feel better in this difficult time. Please use caution when taking this medication especially with using NSAIDS (Alleve). If you begin to have side effects like muscle pain, rigidity, or worsening depressive symptoms stop taking Sertraline immediately and call our office. If you have suicidal thoughts please seek immediate care.  [facility] specialist are always willing to help talk to you about how manage your current stress. Please call them at [phone] and schedule an appointment.  Please return in one month to ensure your depressive symptoms are under control.  Be well, Harolyn Rutherford, DO PGY-2, [facility]

## 2018-03-13 NOTE — Assessment & Plan Note (Signed)
Encouraged patient to find ways to relieve stress and to use Tylenol 500mg  scheduled every 6 hours for the next few days to help relieve pain. Informed patient to use NSAIDS infrequently and only as needed as we are starting SSRI today. Please see problem notes on depression for more details.

## 2018-03-13 NOTE — Assessment & Plan Note (Signed)
PHQ-9 score of 13 and meets DSM-5 criteria for major depressive episode. Starting patient on Sertraline 50mg  and will have her return in one month for follow up.   Patient counseled to seek care and stop taking if she become suicidal. Patient also knows to limit NSAIDS while taking SSRI.

## 2018-03-13 NOTE — Progress Notes (Signed)
Subjective: Chief Complaint  Patient presents with  . Hypertension    pts been having HA's, wonders if it is because of BP  . Headache    x 1 week     HPI: Annette Chang is a 47 y.o. presenting to clinic today to discuss the following:  Right Sided Facial Pain Patient describes an intense pain that she describes as "an ache that feels like a headache" on the right temporal region of her face that surrounds her eye. It does not feel like it is behind her eye and it is not associated with any aura. No nausea, vomiting, or photophobia. No vision changes. She took some Ibuprofen and laid down which helped some. No numbness, tingling, or loss of sensation of the right side of her face. Of note, patient had recent repeat right RCR surgery.  Depression > 3 weeks of lack of sleep, decreased appetite with estimated 5-6lb weight loss, depressed mood, and constant fatigue. Patient's husband recently received a 59 year prison sentence and this has caused her a great deal of stress and anxiety. She also has a daughter and is concerned about providing for her. Due to her recent shoulder surgery she is having difficulty finding a job and now that her husband is incarcerated she is concerned about finances. She has had depression before but cannot remember what medication she took. She denies any suicidal thoughts or plans today and agrees to seek care if they develop.  Health Maintenance: none     ROS noted in HPI.   Past Medical, Surgical, Social, and Family History Reviewed & Updated per EMR.   Pertinent Historical Findings include:   Social History   Tobacco Use  Smoking Status Former Smoker  . Years: 5.00  . Types: Cigarettes  . Last attempt to quit: 11/2017  . Years since quitting: 0.3  Smokeless Tobacco Never Used    Objective: BP 122/80   Pulse (!) 104   Temp 98.5 F (36.9 C) (Oral)   Ht 5\' 1"  (1.549 m)   Wt 91 lb (41.3 kg)   SpO2 94%   BMI 17.19 kg/m  Vitals and  nursing notes reviewed  Physical Exam Gen: Alert and Oriented x 3, NAD HEENT: Normocephalic, atraumatic, PERRLA, EOMI, right sided neck musculature and deltoid muscle are tight and slightly tender to palpation. CV: RRR, no murmurs, normal S1, S2 split Resp: CTAB, no wheezing, rales, or rhonchi, comfortable work of breathing MSK: Left shoulder immobilized in a shoulder sling Ext: no clubbing, cyanosis, or edema Skin: warm, dry, intact, no rashes  No results found for this or any previous visit (from the past 72 hour(s)).  Assessment/Plan:  Headache Encouraged patient to find ways to relieve stress and to use Tylenol 500mg  scheduled every 6 hours for the next few days to help relieve pain. Informed patient to use NSAIDS infrequently and only as needed as we are starting SSRI today. Please see problem notes on depression for more details.  MDD (major depressive disorder) PHQ-9 score of 13 and meets DSM-5 criteria for major depressive episode. Starting patient on Sertraline 50mg  and will have her return in one month for follow up.   Patient counseled to seek care and stop taking if she become suicidal. Patient also knows to limit NSAIDS while taking SSRI.   PATIENT EDUCATION PROVIDED: See AVS    Diagnosis and plan along with any newly prescribed medication(s) were discussed in detail with this patient today. The patient verbalized understanding  and agreed with the plan. Patient advised if symptoms worsen return to clinic or ER.   Health Maintainance:   No orders of the defined types were placed in this encounter.   Meds ordered this encounter  Medications  . ferrous sulfate 325 (65 FE) MG EC tablet    Sig: Take 1 tablet (325 mg total) by mouth daily with breakfast.    Dispense:  30 tablet    Refill:  3  . sertraline (ZOLOFT) 50 MG tablet    Sig: Take 1 tablet (50 mg total) by mouth daily.    Dispense:  30 tablet    Refill:  [place], DO 03/13/2018, 9:58 AM PGY-2  [place]

## 2018-03-23 ENCOUNTER — Ambulatory Visit (INDEPENDENT_AMBULATORY_CARE_PROVIDER_SITE_OTHER): Payer: Medicaid Other | Admitting: Orthopedic Surgery

## 2018-03-23 ENCOUNTER — Encounter (INDEPENDENT_AMBULATORY_CARE_PROVIDER_SITE_OTHER): Payer: Self-pay | Admitting: Orthopedic Surgery

## 2018-03-23 DIAGNOSIS — M958 Other specified acquired deformities of musculoskeletal system: Secondary | ICD-10-CM

## 2018-03-23 MED ORDER — METHOCARBAMOL 500 MG PO TABS: 500.0000 mg | ORAL_TABLET | Freq: Three times a day (TID) | ORAL | 0 refills | Status: DC | PRN

## 2018-03-23 NOTE — Progress Notes (Signed)
Post-Op Visit Note   Patient: Annette Chang           Date of Birth: 17-Sep-1970           MRN: [id number] Visit Date: 03/23/2018 PCP: Bufford Lope, DO   Assessment & Plan:  Chief Complaint:  Chief Complaint  Patient presents with  . Right Shoulder - Routine Post Op   Visit Diagnoses:  1. Winged scapula of right side     Plan: [name] is now about 2 months out from revision of pectoralis tendon transfer.  She has been having continued disability in that right shoulder.  On exam I think that the repair has stretched.  She still has recurrent winging and limited forward flexion and abduction.  Deltoid is functional.  I do feel the muscle attachment to the scapula but it has returned to its wings position.  At this time I do not think any more surgical treatment is indicated.  The only thing she could consider would be scapulothoracic fusion which would be a massive undertaking.  She is taking Zoloft because she is having a lot of issues in regards to not being able to work and pay her bills.  I think she does have significant functional limitation from a work standpoint with that right arm.  I am going to refill her Robaxin.  We will check her back in 4 months for final check.  Follow-Up Instructions: Return in about 4 months (around 07/23/2018).   Orders:  No orders of the defined types were placed in this encounter.  Meds ordered this encounter  Medications  . methocarbamol (ROBAXIN) 500 MG tablet    Sig: Take 1 tablet (500 mg total) by mouth every 8 (eight) hours as needed for muscle spasms.    Dispense:  35 tablet    Refill:  0    Imaging: No results found.  PMFS History: Patient Active Problem List   Diagnosis Date Noted  . Osteoporosis 12/08/2017  . Toe pain, left 12/05/2017  . Anxiety 12/05/2017  . 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 Suicidal ideations 03/30/2016  . MDD (major depressive disorder) 03/29/2016  . Breast pain 01/18/2016  . 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 Chronic urticaria 08/06/2015  . Paresthesia 04/15/2015  . Headache 02/20/2015  . 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 Right shoulder pain 08/22/2014  . DUB (dysfunctional uterine bleeding) 03/11/2014  . H/O rotator cuff surgery 01/02/2012  . 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 Iron deficiency anemia 01/02/2012   Past Medical History:  Diagnosis Date  . Anxiety   . 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 Depression   . H/O rotator cuff surgery 01/02/2012  . H/O tubal ligation 01/02/2012  . H/O: C-section 01/02/2012   3   . H/O: C-section 01/02/2012   3   . Headache(784.0) 01/02/2012  . Iron deficiency anemia 01/02/2012  . 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 Diabetes Maternal Grandmother   . Diabetes Paternal Grandmother   . Diabetes Paternal Grandfather     Past Surgical History:  Procedure Laterality Date  . CESAREAN SECTION     3 previous c sections  . MUSCLE REPAIR Right 01/10/2017   RIGHT PECTORAL MAJOR TO SCAPULA MUSCLE TRANSFER (Right  . NOVASURE ABLATION N/A 03/11/2014   Procedure: NOVASURE ABLATION;  Surgeon: Emily Filbert, MD;  Location: [facility];  Service:  Gynecology;  Laterality: N/A;  . PECTORALIS TENDON REPAIR Right 01/10/2017   Procedure: RIGHT PECTORAL MAJOR TO SCAPULA MUSCLE TRANSFER;  Surgeon: Meredith Pel, MD;  Location: [facility];  Service: Orthopedics;  Laterality: Right;  . PECTORALIS TENDON REPAIR Right 12/28/2017   Procedure: RIGHT REVISION TENDON TRANSFER OF PECTORALIS MAJOR;  Surgeon: Meredith Pel, MD;  Location: [facility];  Service: Orthopedics;  Laterality: Right;  . rotator cuff surgery    . SHOULDER SURGERY    . TUBAL LIGATION     Social History   Occupational History  . Occupation: unemployed  Tobacco Use  . Smoking status: Former Smoker    Years: 5.00    Types: Cigarettes    Last attempt to quit: 11/2017    Years since quitting: 0.3    . Smokeless tobacco: Never Used  Substance and Sexual Activity  . Alcohol use: Yes    Alcohol/week: 14.0 standard drinks    Types: 14 Glasses of wine per week    Comment: weekends  . 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 Sexual activity: Not Currently    Birth control/protection: Surgical

## 2018-03-27 ENCOUNTER — Telehealth (INDEPENDENT_AMBULATORY_CARE_PROVIDER_SITE_OTHER): Payer: Self-pay | Admitting: Orthopedic Surgery

## 2018-03-27 NOTE — Telephone Encounter (Signed)
Patient called requesting that Dr. Marlou Sa write her another letter.  She did not go into specifics but stated that he would know what she was talking about.  CB#[phone].  Thank you.

## 2018-03-28 ENCOUNTER — Telehealth (INDEPENDENT_AMBULATORY_CARE_PROVIDER_SITE_OTHER): Payer: Self-pay | Admitting: Orthopedic Surgery

## 2018-03-28 NOTE — Telephone Encounter (Signed)
Y same as before I can do again if needed

## 2018-03-28 NOTE — Telephone Encounter (Signed)
Tried calling patient. No answer. Put copy of note up front for her to pick up.

## 2018-03-28 NOTE — Telephone Encounter (Signed)
Patient returned call stating she need the letter to get assistance in paying her bills. Patient said Dr Marlou Sa wrote one for her before. Patient said she is still not able to work. The number to contact patient is [phone]

## 2018-03-28 NOTE — Telephone Encounter (Signed)
IC LM for patient to [facility] to discuss. Need specifics on what type of letter that she is requesting.

## 2018-03-28 NOTE — Telephone Encounter (Signed)
Ok for note 

## 2018-04-09 ENCOUNTER — Ambulatory Visit (INDEPENDENT_AMBULATORY_CARE_PROVIDER_SITE_OTHER): Payer: Medicaid Other

## 2018-05-23 ENCOUNTER — Ambulatory Visit (INDEPENDENT_AMBULATORY_CARE_PROVIDER_SITE_OTHER): Payer: Medicaid Other | Admitting: Orthopedic Surgery

## 2018-05-30 ENCOUNTER — Ambulatory Visit (INDEPENDENT_AMBULATORY_CARE_PROVIDER_SITE_OTHER): Payer: Medicaid Other | Admitting: Orthopedic Surgery

## 2018-05-30 ENCOUNTER — Encounter (INDEPENDENT_AMBULATORY_CARE_PROVIDER_SITE_OTHER): Payer: Self-pay | Admitting: Orthopedic Surgery

## 2018-05-30 DIAGNOSIS — M958 Other specified acquired deformities of musculoskeletal system: Secondary | ICD-10-CM

## 2018-05-30 MED ORDER — METHOCARBAMOL 500 MG PO TABS: ORAL_TABLET | ORAL | 0 refills | Status: DC

## 2018-05-30 MED ORDER — HYDROCODONE-ACETAMINOPHEN 10-325 MG PO TABS: ORAL_TABLET | ORAL | 0 refills | Status: DC

## 2018-05-30 NOTE — Progress Notes (Signed)
Office Visit Note   Patient: Annette Chang           Date of Birth: 07/04/1971           MRN: [id number] Visit Date: 05/30/2018 Requested by: Bufford Lope, DO [place], [place] [address] PCP: Bufford Lope, DO  Subjective: Chief Complaint  Patient presents with  . 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  This is a chronic problem when I initially saw her.  She is had pec transfer and revision pec transfer to try to diminish the scapular winging.  She had initial improvement after these surgeries but the weaning slowly recurred.  She is put about self back in a sling for part of the time.  She does report inability to go overhead with that right arm.              ROS: All systems reviewed are negative as they relate to the chief complaint within the history of present illness.  Patient denies  fevers or chills.   Assessment & Plan: Visit Diagnoses:  1. Winged scapula of right side     Plan: Impression is right shoulder scapular winging with functionally palpable muscle transfer but it is not really correcting the winging enough to make any measurable difference in her shoulder function.  I do not think there is too much more to be done at this time.  I am in a refill her muscle relaxer in continue with the note which states that she has really diminished functional ability in terms of working with that right arm.  One-time prescription for pain medicine refilled.  I will see her back as needed.  Follow-Up Instructions: Return if symptoms worsen or fail to improve.   Orders:  No orders of the defined types were placed in this encounter.  Meds ordered this encounter  Medications  . methocarbamol (ROBAXIN) 500 MG tablet    Sig: 1 po q 8 hrs prn    Dispense:  30 tablet    Refill:  0  . HYDROcodone-acetaminophen (NORCO) 10-325 MG tablet    Sig: 1 po q 8-12hrs prn pain    Dispense:  30 tablet   Refill:  0      Procedures: No procedures performed   Clinical Data: No additional findings.  Objective: Vital Signs: There were no vitals taken for this visit.  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  The pec transfer is palpable on the edge of the scapular border.  She does have diminished forward flexion and abduction strength.  Deltoid is functional.  No other masses lymph adenopathy or skin changes noted in that shoulder girdle region.  Specialty Comments:  No specialty comments available.  Imaging: No results found.   PMFS History: Patient Active Problem List   Diagnosis Date Noted  . Osteoporosis 12/08/2017  . Toe pain, left 12/05/2017  . Anxiety 12/05/2017  . 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 Suicidal ideations 03/30/2016  . MDD (major depressive disorder) 03/29/2016  . Breast pain 01/18/2016  . 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 Chronic urticaria 08/06/2015  .  Paresthesia 04/15/2015  . Headache 02/20/2015  . 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 Right shoulder pain 08/22/2014  . DUB (dysfunctional uterine bleeding) 03/11/2014  . H/O rotator cuff surgery 01/02/2012  . 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 Iron deficiency anemia 01/02/2012   Past Medical History:  Diagnosis Date  . Anxiety   . 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 Depression   . H/O rotator cuff surgery 01/02/2012  . H/O tubal ligation 01/02/2012  . H/O: C-section 01/02/2012   3   . H/O: C-section 01/02/2012   3   . Headache(784.0) 01/02/2012  . Iron deficiency anemia 01/02/2012  . 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 Diabetes Maternal Grandmother   . Diabetes Paternal Grandmother   . Diabetes Paternal Grandfather     Past Surgical History:  Procedure Laterality Date  . CESAREAN SECTION     3 previous c sections  . MUSCLE REPAIR Right 01/10/2017   RIGHT PECTORAL MAJOR TO SCAPULA MUSCLE TRANSFER (Right  . NOVASURE ABLATION N/A 03/11/2014   Procedure: NOVASURE ABLATION;  Surgeon: Emily Filbert, MD;  Location: [facility];  Service: Gynecology;  Laterality: N/A;  . PECTORALIS TENDON REPAIR Right 01/10/2017   Procedure: RIGHT PECTORAL MAJOR TO SCAPULA MUSCLE TRANSFER;  Surgeon: Meredith Pel, MD;  Location: [facility];  Service: Orthopedics;  Laterality: Right;  . PECTORALIS TENDON REPAIR Right 12/28/2017   Procedure: RIGHT REVISION TENDON TRANSFER OF PECTORALIS MAJOR;  Surgeon: Meredith Pel, MD;  Location: [facility];  Service: Orthopedics;  Laterality: Right;  . rotator cuff surgery    . SHOULDER SURGERY    . TUBAL LIGATION     Social History   Occupational History  . Occupation: unemployed  Tobacco Use  . Smoking status: Former Smoker    Years: 5.00    Types: Cigarettes    Last attempt to quit: 11/2017    Years since quitting: 0.5  . Smokeless tobacco: Never Used  Substance and Sexual Activity  . Alcohol use: Yes    Alcohol/week: 14.0 standard drinks    Types: 14 Glasses of wine per week    Comment: weekends  . 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 Sexual activity: Not Currently    Birth control/protection: Surgical

## 2018-06-20 ENCOUNTER — Telehealth (INDEPENDENT_AMBULATORY_CARE_PROVIDER_SITE_OTHER): Payer: Self-pay | Admitting: Orthopedic Surgery

## 2018-06-20 ENCOUNTER — Other Ambulatory Visit (INDEPENDENT_AMBULATORY_CARE_PROVIDER_SITE_OTHER): Payer: Self-pay

## 2018-06-20 MED ORDER — HYDROCODONE-ACETAMINOPHEN 10-325 MG PO TABS: ORAL_TABLET | ORAL | 0 refills | Status: DC

## 2018-06-20 NOTE — Telephone Encounter (Signed)
Patient called needing Rx refilled (Oxycodone) The number to contact patient is [phone] Patient said she uses the [facility] on [address]

## 2018-06-20 NOTE — Telephone Encounter (Signed)
Please advise 

## 2018-06-20 NOTE — Telephone Encounter (Signed)
Ok to rf x 1 only pls clal thx

## 2018-06-20 NOTE — Telephone Encounter (Signed)
Rx ready for pick up at the front desk. Patient aware. 

## 2018-07-11 ENCOUNTER — Ambulatory Visit (INDEPENDENT_AMBULATORY_CARE_PROVIDER_SITE_OTHER): Payer: Medicaid Other | Admitting: Orthopedic Surgery

## 2018-07-20 ENCOUNTER — Telehealth (INDEPENDENT_AMBULATORY_CARE_PROVIDER_SITE_OTHER): Payer: Self-pay | Admitting: Orthopedic Surgery

## 2018-07-20 NOTE — Telephone Encounter (Signed)
Pt is calling requesting a refill on muscle relaxer's, hydrocodone  Pt [phone]

## 2018-07-20 NOTE — Telephone Encounter (Signed)
Looks like he did not plan further refills of pain medications from last note. Okay to refill muscle relaxer only. Thanks, [facility]

## 2018-07-20 NOTE — Telephone Encounter (Signed)
Can you advise?  [name] patient.

## 2018-07-23 MED ORDER — METHOCARBAMOL 500 MG PO TABS: ORAL_TABLET | ORAL | 0 refills | Status: DC

## 2018-07-24 NOTE — Telephone Encounter (Signed)
Patient requests refill of pain medication

## 2018-07-24 NOTE — Telephone Encounter (Signed)
y

## 2018-07-24 NOTE — Telephone Encounter (Signed)
Patient came into office to pick up medication she will pick her methocarbamol from pharmacy as advised. Patient stated she would like her Hydrocodone as well. Patient is aware Dr.[name] is out of the office.

## 2018-07-26 ENCOUNTER — Other Ambulatory Visit (INDEPENDENT_AMBULATORY_CARE_PROVIDER_SITE_OTHER): Payer: Self-pay

## 2018-07-26 MED ORDER — HYDROCODONE-ACETAMINOPHEN 10-325 MG PO TABS: ORAL_TABLET | ORAL | 0 refills | Status: DC

## 2018-07-26 NOTE — Telephone Encounter (Signed)
Patient aware Rx ready at front desk  

## 2018-08-08 ENCOUNTER — Other Ambulatory Visit: Payer: Self-pay | Admitting: Family Medicine

## 2018-08-08 DIAGNOSIS — Z1231 Encounter for screening mammogram for malignant neoplasm of breast: Secondary | ICD-10-CM

## 2018-08-22 ENCOUNTER — Telehealth (INDEPENDENT_AMBULATORY_CARE_PROVIDER_SITE_OTHER): Payer: Self-pay | Admitting: Orthopedic Surgery

## 2018-08-22 NOTE — Telephone Encounter (Signed)
PT called requesting for medication refill  Muscle relaxer and her hydrocodone.

## 2018-08-22 NOTE — Telephone Encounter (Signed)
Please advise 

## 2018-08-23 ENCOUNTER — Other Ambulatory Visit (INDEPENDENT_AMBULATORY_CARE_PROVIDER_SITE_OTHER): Payer: Self-pay | Admitting: Physician Assistant

## 2018-08-23 MED ORDER — METHOCARBAMOL 500 MG PO TABS: 500.0000 mg | ORAL_TABLET | Freq: Two times a day (BID) | ORAL | 0 refills | Status: DC | PRN

## 2018-08-23 NOTE — Telephone Encounter (Signed)
Patient called back and was advised. Holding til Dr Marlou Sa is back to refill hydrocodone.

## 2018-08-23 NOTE — Telephone Encounter (Signed)
Okay to refill please call thanks

## 2018-08-23 NOTE — Telephone Encounter (Signed)
Patient called to inquire about her RX refill.  Please advise pt.  CB#[phone].  Thank you.

## 2018-08-23 NOTE — Telephone Encounter (Signed)
Please print and have Dr. Marlou Sa sign.

## 2018-08-23 NOTE — Telephone Encounter (Signed)
Refilled muscle relaxer only

## 2018-08-23 NOTE — Telephone Encounter (Signed)
Rx refills

## 2018-08-24 NOTE — Telephone Encounter (Signed)
See below. Sending to you to send in Rx hydrocodone.  Robaxin was filled already. Patient aware this is waiting on you to do next week when back in clinic.

## 2018-08-26 NOTE — Telephone Encounter (Signed)
Okay to refill pain med thanks

## 2018-08-27 ENCOUNTER — Ambulatory Visit: Payer: Medicaid Other | Admitting: Family Medicine

## 2018-08-27 ENCOUNTER — Other Ambulatory Visit (INDEPENDENT_AMBULATORY_CARE_PROVIDER_SITE_OTHER): Payer: Self-pay

## 2018-08-27 MED ORDER — HYDROCODONE-ACETAMINOPHEN 10-325 MG PO TABS: ORAL_TABLET | ORAL | 0 refills | Status: DC

## 2018-08-27 NOTE — Telephone Encounter (Signed)
Financial controller

## 2018-08-27 NOTE — Telephone Encounter (Signed)
Can you get this one printed and ready to sign? Refill hydrocodone. Thanks

## 2018-08-27 NOTE — Telephone Encounter (Signed)
Called patient no answer LMOM to return call. Rx ready for pick up

## 2018-08-28 ENCOUNTER — Telehealth (INDEPENDENT_AMBULATORY_CARE_PROVIDER_SITE_OTHER): Payer: Self-pay | Admitting: Orthopedic Surgery

## 2018-08-28 NOTE — Telephone Encounter (Signed)
Patient wants to know if her insurance will cover her getting a new sling for her rt shoulder. Patient's callback # [phone] or [phone]

## 2018-08-30 NOTE — Telephone Encounter (Signed)
To my knowledge Medicaid will not cover the sling.  We can certainly give it to her, but she will prob get the bill sent to her.

## 2018-08-30 NOTE — Telephone Encounter (Signed)
Advised 

## 2018-09-03 ENCOUNTER — Ambulatory Visit (INDEPENDENT_AMBULATORY_CARE_PROVIDER_SITE_OTHER): Payer: Medicaid Other | Admitting: Orthopedic Surgery

## 2018-09-03 ENCOUNTER — Encounter (INDEPENDENT_AMBULATORY_CARE_PROVIDER_SITE_OTHER): Payer: Self-pay

## 2018-09-03 ENCOUNTER — Ambulatory Visit (INDEPENDENT_AMBULATORY_CARE_PROVIDER_SITE_OTHER): Payer: Self-pay | Admitting: Orthopedic Surgery

## 2018-09-03 DIAGNOSIS — M958 Other specified acquired deformities of musculoskeletal system: Secondary | ICD-10-CM | POA: Diagnosis not present

## 2018-09-03 DIAGNOSIS — M25511 Pain in right shoulder: Secondary | ICD-10-CM

## 2018-09-03 NOTE — Progress Notes (Signed)
Patient came to the office to be fitted for a sling for her right shoulder per Dr. Marlou Sa.  Patient has appointment on Wednesday, 09/12/2018 with Dr. Marlou Sa.

## 2018-09-10 ENCOUNTER — Ambulatory Visit: Payer: Self-pay | Admitting: Internal Medicine

## 2018-09-12 ENCOUNTER — Encounter (INDEPENDENT_AMBULATORY_CARE_PROVIDER_SITE_OTHER): Payer: Self-pay | Admitting: Orthopedic Surgery

## 2018-09-12 ENCOUNTER — Ambulatory Visit (INDEPENDENT_AMBULATORY_CARE_PROVIDER_SITE_OTHER): Payer: Medicaid Other | Admitting: Orthopedic Surgery

## 2018-09-12 DIAGNOSIS — R202 Paresthesia of skin: Secondary | ICD-10-CM | POA: Diagnosis not present

## 2018-09-12 DIAGNOSIS — R200 Anesthesia of skin: Secondary | ICD-10-CM

## 2018-09-12 MED ORDER — METHOCARBAMOL 500 MG PO TABS: 500.0000 mg | ORAL_TABLET | Freq: Two times a day (BID) | ORAL | 0 refills | Status: DC

## 2018-09-12 MED ORDER — HYDROCODONE-ACETAMINOPHEN 10-325 MG PO TABS: 1.0000 | ORAL_TABLET | Freq: Every day | ORAL | 0 refills | Status: DC

## 2018-09-13 ENCOUNTER — Other Ambulatory Visit (INDEPENDENT_AMBULATORY_CARE_PROVIDER_SITE_OTHER): Payer: Self-pay | Admitting: *Deleted

## 2018-09-15 ENCOUNTER — Encounter (INDEPENDENT_AMBULATORY_CARE_PROVIDER_SITE_OTHER): Payer: Self-pay | Admitting: Orthopedic Surgery

## 2018-09-15 NOTE — Progress Notes (Signed)
Office Visit Note   Patient: Annette Chang           Date of Birth: [birth year]           MRN: [id number] Visit Date: 09/12/2018 Requested by: Bufford Lope, DO [place], [place] [address] PCP: Bufford Lope, DO  Subjective: Chief Complaint  Patient presents with  . Shoulder Pain    HPI: Patient presents for evaluation of left and right shoulder pain.  Right shoulder really has not changed much.  She has had tendon transfer for scapular winging but that has never really corrected the problem.  She now describes left hand and arm symptoms.  She feels like her left shoulder starting to do the same thing as her right shoulder did.  She reports some numbness and tingling in the hand as well.  She is taking over-the-counter medications with occasional narcotic medication.              ROS: All systems reviewed are negative as they relate to the chief complaint within the history of present illness.  Patient denies  fevers or chills.   Assessment & Plan: Visit Diagnoses:  1. Numbness and tingling in left arm     Plan: Impression is left arm numbness and tingling.  I do not see any scapular winging on the left as she does have on the right.  However I think she does need EMG nerve study of that left arm so we can figure out where the nerve compression is coming from.  I will see her back after that study.  Did refill Robaxin and Norco.  Follow-Up Instructions: No follow-ups on file.   Orders:  Orders Placed This Encounter  Procedures  . Ambulatory referral to Physical Medicine Rehab  . Ambulatory referral to Neurology   Meds ordered this encounter  Medications  . methocarbamol (ROBAXIN) 500 MG tablet    Sig: Take 1 tablet (500 mg total) by mouth 2 (two) times daily.    Dispense:  30 tablet    Refill:  0  . HYDROcodone-acetaminophen (NORCO) 10-325 MG tablet    Sig: Take 1 tablet by mouth daily. 1 po q 8-12hrs prn pain    Dispense:  30 tablet    Refill:  0      Procedures: No procedures performed   Clinical Data: No additional findings.  Objective: Vital Signs: There were no vitals taken for this visit.  Physical Exam:   Constitutional: Patient appears well-developed HEENT:  Head: Normocephalic Eyes:EOM are normal Neck: Normal range of motion Cardiovascular: Normal rate Pulmonary/chest: Effort normal Neurologic: Patient is alert Skin: Skin is warm Psychiatric: Patient has normal mood and affect    Ortho Exam: Ortho exam demonstrates scapular winging on the right consistent with her known diagnosis of longstanding serratus anterior palsy.  The transferred muscle is palpable at the inferior tip of the scapula but it is not pulling the scapula down enough for a stable platform for her right humerus.  On the left-hand side there is no scapular winging but she does report paresthesias in the hand and fingers.  No axillary lymphadenopathy on the left and negative Tinel's cubital tunnel at the elbow.  Specialty Comments:  No specialty comments available.  Imaging: No results found.   PMFS History: Patient Active Problem List   Diagnosis Date Noted  . Osteoporosis 12/08/2017  . Toe pain, left 12/05/2017  . Anxiety 12/05/2017  . 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 Suicidal ideations  03/30/2016  . MDD (major depressive disorder) 03/29/2016  . Breast pain 01/18/2016  . 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 Chronic urticaria 08/06/2015  . Paresthesia 04/15/2015  . Headache 02/20/2015  . 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 Right shoulder pain 08/22/2014  . DUB (dysfunctional uterine bleeding) 03/11/2014  . H/O rotator cuff surgery 01/02/2012  . 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 Iron deficiency anemia 01/02/2012   Past Medical History:  Diagnosis Date  . Anxiety   . 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 Depression   . H/O rotator cuff surgery 01/02/2012  . H/O tubal ligation 01/02/2012  . H/O:  C-section 01/02/2012   3   . H/O: C-section 01/02/2012   3   . Headache(784.0) 01/02/2012  . Iron deficiency anemia 01/02/2012  . 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 Diabetes Maternal Grandmother   . Diabetes Paternal Grandmother   . Diabetes Paternal Grandfather     Past Surgical History:  Procedure Laterality Date  . CESAREAN SECTION     3 previous c sections  . MUSCLE REPAIR Right 01/10/2017   RIGHT PECTORAL MAJOR TO SCAPULA MUSCLE TRANSFER (Right  . NOVASURE ABLATION N/A 03/11/2014   Procedure: NOVASURE ABLATION;  Surgeon: Emily Filbert, MD;  Location: [facility];  Service: Gynecology;  Laterality: N/A;  . PECTORALIS TENDON REPAIR Right 01/10/2017   Procedure: RIGHT PECTORAL MAJOR TO SCAPULA MUSCLE TRANSFER;  Surgeon: Meredith Pel, MD;  Location: [facility];  Service: Orthopedics;  Laterality: Right;  . PECTORALIS TENDON REPAIR Right 12/28/2017   Procedure: RIGHT REVISION TENDON TRANSFER OF PECTORALIS MAJOR;  Surgeon: Meredith Pel, MD;  Location: [facility];  Service: Orthopedics;  Laterality: Right;  . rotator cuff surgery    . SHOULDER SURGERY    . TUBAL LIGATION     Social History   Occupational History  . Occupation: unemployed  Tobacco Use  . Smoking status: Former Smoker    Years: 5.00    Types: Cigarettes    Last attempt to quit: 11/2017    Years since quitting: 0.8  . Smokeless tobacco: Never Used  Substance and Sexual Activity  . Alcohol use: Yes    Alcohol/week: 14.0 standard drinks    Types: 14 Glasses of wine per week    Comment: weekends  . 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 Sexual activity: Not Currently    Birth control/protection: Surgical

## 2018-10-17 ENCOUNTER — Telehealth (INDEPENDENT_AMBULATORY_CARE_PROVIDER_SITE_OTHER): Payer: Self-pay | Admitting: Orthopedic Surgery

## 2018-10-17 NOTE — Telephone Encounter (Signed)
New Message   *STAT* If patient is at the pharmacy, call can be transferred to refill team.   1. Which medications need to be refilled? (please list name of each medication and dose if known)  hydrocodone 10-352 mg tablet once daily or 8-12 hrs methocarbamol 500 mg twice daily  2. Which pharmacy/location (including street and city if local pharmacy) is medication to be sent to? [facility] [phone] - , [place] - [address] AT [place]  3. Do they need a 30 day or 90 day supply?  30 day supply

## 2018-10-17 NOTE — Telephone Encounter (Signed)
Please advise. Patient request norco 10/325 and robaxin.

## 2018-10-18 DIAGNOSIS — Z0289 Encounter for other administrative examinations: Secondary | ICD-10-CM

## 2018-10-18 MED ORDER — HYDROCODONE-ACETAMINOPHEN 5-325 MG PO TABS: ORAL_TABLET | ORAL | 0 refills | Status: DC

## 2018-10-18 MED ORDER — METHOCARBAMOL 500 MG PO TABS: 500.0000 mg | ORAL_TABLET | Freq: Three times a day (TID) | ORAL | 0 refills | Status: DC | PRN

## 2018-10-18 NOTE — Telephone Encounter (Signed)
Ok for norco 5 and robaxin but we cannot continue to fill indefinitely

## 2018-10-18 NOTE — Addendum Note (Signed)
Addended byLaurann Montana on: 10/18/2018 08:58 AM   Modules accepted: Orders

## 2018-10-18 NOTE — Telephone Encounter (Signed)
IC advised could pick up at front desk.  

## 2018-10-29 ENCOUNTER — Telehealth (INDEPENDENT_AMBULATORY_CARE_PROVIDER_SITE_OTHER): Payer: Self-pay

## 2018-10-29 NOTE — Telephone Encounter (Signed)
I had tried to call patient to advise and was unable to reach her. Can discuss at [place].

## 2018-10-29 NOTE — Telephone Encounter (Signed)
Patient called to inquire about nerve study because she states no one ever called her. I looked at referral and advised pt they were having a hard time finding a doctor to see her. She wanted to know why no one ever updated her? She says she is in a lot of pain and requested follow up appt. Made her an appt for weds.

## 2018-10-30 ENCOUNTER — Telehealth (INDEPENDENT_AMBULATORY_CARE_PROVIDER_SITE_OTHER): Payer: Self-pay | Admitting: *Deleted

## 2018-10-30 NOTE — Telephone Encounter (Signed)
Asked pt covid-19 pre screening questions and they answered no to all questions.

## 2018-10-31 ENCOUNTER — Ambulatory Visit (INDEPENDENT_AMBULATORY_CARE_PROVIDER_SITE_OTHER): Payer: Medicaid Other | Admitting: Orthopedic Surgery

## 2018-11-19 ENCOUNTER — Ambulatory Visit (INDEPENDENT_AMBULATORY_CARE_PROVIDER_SITE_OTHER): Payer: Medicaid Other | Admitting: Orthopedic Surgery

## 2018-11-22 ENCOUNTER — Other Ambulatory Visit: Payer: Self-pay

## 2018-11-22 ENCOUNTER — Encounter (INDEPENDENT_AMBULATORY_CARE_PROVIDER_SITE_OTHER): Payer: Self-pay | Admitting: Orthopedic Surgery

## 2018-11-22 ENCOUNTER — Ambulatory Visit (INDEPENDENT_AMBULATORY_CARE_PROVIDER_SITE_OTHER): Payer: Medicaid Other | Admitting: Orthopedic Surgery

## 2018-11-22 DIAGNOSIS — R200 Anesthesia of skin: Secondary | ICD-10-CM

## 2018-11-22 DIAGNOSIS — R202 Paresthesia of skin: Secondary | ICD-10-CM

## 2018-11-22 MED ORDER — HYDROCODONE-ACETAMINOPHEN 5-325 MG PO TABS: ORAL_TABLET | ORAL | 0 refills | Status: DC

## 2018-11-22 MED ORDER — METHOCARBAMOL 500 MG PO TABS: 500.0000 mg | ORAL_TABLET | Freq: Two times a day (BID) | ORAL | 0 refills | Status: DC | PRN

## 2018-11-22 NOTE — Progress Notes (Signed)
Office Visit Note   Patient: Annette Chang           Date of Birth: May 30, 1971           MRN: [id number] Visit Date: 11/22/2018 Requested by: Bufford Lope, DO [place], [place] [address] PCP: Bufford Lope, DO  Subjective: Chief Complaint  Patient presents with  . Right Shoulder - Follow-up  . Arm Pain    HPI: Patient presents for evaluation of left arm numbness tingling and weakness.  She has had right shoulder tendon transfer for scapular winging which was unsuccessful.  She is using muscle relaxers and occasional pain medicine.  That is refilled today.  She has a different type of pillow that she is using to help her sleep.  She describes her left arm is developing weakness.  This is her functional arm.  Denies any history of injury.              ROS: All systems reviewed are negative as they relate to the chief complaint within the history of present illness.  Patient denies  fevers or chills.   Assessment & Plan: Visit Diagnoses:  1. Numbness and tingling in left arm     Plan: Impression is left arm weakness with no clear evidence of radiculopathy.  Plan EMG nerve study left arm just to evaluate for any obvious nerve compression.  We need to try to keep the left arm as healthy as possible.  I will see her back after that study  Follow-Up Instructions: No follow-ups on file.   Orders:  Orders Placed This Encounter  Procedures  . Ambulatory referral to Physical Medicine Rehab   Meds ordered this encounter  Medications  . HYDROcodone-acetaminophen (NORCO/VICODIN) 5-325 MG tablet    Sig: 1 po q d prn pain    Dispense:  30 tablet    Refill:  0  . methocarbamol (ROBAXIN) 500 MG tablet    Sig: Take 1 tablet (500 mg total) by mouth every 12 (twelve) hours as needed for muscle spasms.    Dispense:  30 tablet    Refill:  0      Procedures: No procedures performed   Clinical Data: No additional findings.  Objective: Vital Signs: There were no vitals  taken for this visit.  Physical Exam:   Constitutional: Patient appears well-developed HEENT:  Head: Normocephalic Eyes:EOM are normal Neck: Normal range of motion Cardiovascular: Normal rate Pulmonary/chest: Effort normal Neurologic: Patient is alert Skin: Skin is warm Psychiatric: Patient has normal mood and affect    Ortho Exam: Ortho exam demonstrates good cervical spine range of motion.  Patient has no scapular winging on the left.  Good motor sensory function on that left hand.  Radial pulses intact.  She has 5 out of 5 grip EPL FPL interosseous wrist flexion extension bicep triceps and deltoid strength.  No other masses lymphadenopathy or skin changes noted in that shoulder or arm region.  Specialty Comments:  No specialty comments available.  Imaging: No results found.   PMFS History: Patient Active Problem List   Diagnosis Date Noted  . Osteoporosis 12/08/2017  . Toe pain, left 12/05/2017  . Anxiety 12/05/2017  . 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 Suicidal ideations 03/30/2016  . MDD (major depressive disorder) 03/29/2016  . Breast pain 01/18/2016  . 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 Chronic urticaria 08/06/2015  . Paresthesia 04/15/2015  . Headache 02/20/2015  . 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  Right shoulder pain 08/22/2014  . DUB (dysfunctional uterine bleeding) 03/11/2014  . H/O rotator cuff surgery 01/02/2012  . 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 Iron deficiency anemia 01/02/2012   Past Medical History:  Diagnosis Date  . Anxiety   . 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 Depression   . H/O rotator cuff surgery 01/02/2012  . H/O tubal ligation 01/02/2012  . H/O: C-section 01/02/2012   3   . H/O: C-section 01/02/2012   3   . Headache(784.0) 01/02/2012  . Iron deficiency anemia 01/02/2012  . 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 Diabetes Maternal  Grandmother   . Diabetes Paternal Grandmother   . Diabetes Paternal Grandfather     Past Surgical History:  Procedure Laterality Date  . CESAREAN SECTION     3 previous c sections  . MUSCLE REPAIR Right 01/10/2017   RIGHT PECTORAL MAJOR TO SCAPULA MUSCLE TRANSFER (Right  . NOVASURE ABLATION N/A 03/11/2014   Procedure: NOVASURE ABLATION;  Surgeon: Emily Filbert, MD;  Location: [facility];  Service: Gynecology;  Laterality: N/A;  . PECTORALIS TENDON REPAIR Right 01/10/2017   Procedure: RIGHT PECTORAL MAJOR TO SCAPULA MUSCLE TRANSFER;  Surgeon: Meredith Pel, MD;  Location: [facility];  Service: Orthopedics;  Laterality: Right;  . PECTORALIS TENDON REPAIR Right 12/28/2017   Procedure: RIGHT REVISION TENDON TRANSFER OF PECTORALIS MAJOR;  Surgeon: Meredith Pel, MD;  Location: [facility];  Service: Orthopedics;  Laterality: Right;  . rotator cuff surgery    . SHOULDER SURGERY    . TUBAL LIGATION     Social History   Occupational History  . Occupation: unemployed  Tobacco Use  . Smoking status: Former Smoker    Years: 5.00    Types: Cigarettes    Last attempt to quit: 11/2017    Years since quitting: 1.0  . Smokeless tobacco: Never Used  Substance and Sexual Activity  . Alcohol use: Yes    Alcohol/week: 14.0 standard drinks    Types: 14 Glasses of wine per week    Comment: weekends  . 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 Sexual activity: Not Currently    Birth control/protection: Surgical

## 2018-11-29 ENCOUNTER — Ambulatory Visit (INDEPENDENT_AMBULATORY_CARE_PROVIDER_SITE_OTHER): Payer: Medicaid Other | Admitting: Physical Medicine and Rehabilitation

## 2018-11-29 ENCOUNTER — Other Ambulatory Visit: Payer: Self-pay

## 2018-11-29 ENCOUNTER — Encounter: Payer: Self-pay | Admitting: Physical Medicine and Rehabilitation

## 2018-11-29 VITALS — Temp 98.0°F

## 2018-11-29 DIAGNOSIS — R202 Paresthesia of skin: Secondary | ICD-10-CM | POA: Diagnosis not present

## 2018-11-29 NOTE — Progress Notes (Signed)
 .  Numeric Pain Rating Scale and Functional Assessment Average Pain 0   In the last MONTH (on 0-10 scale) has pain interfered with the following?  1. General activity like being  able to carry out your everyday physical activities such as walking, climbing stairs, carrying groceries, or moving a chair?  Rating(8)     

## 2018-11-30 NOTE — Procedures (Signed)
EMG & NCV Findings: All nerve conduction studies (as indicated in the following tables) were within normal limits.    All examined muscles (as indicated in the following table) showed no evidence of electrical instability.    Impression: Essentially NORMAL electrodiagnostic study of the left upper limb.  There is no significant electrodiagnostic evidence of nerve entrapment, brachial plexopathy or cervical radiculopathy.    As you know, purely sensory or demyelinating radiculopathies and chemical radiculitis may not be detected with this particular electrodiagnostic study.  Also this would not detect a purely demyelinated specific brachial plexus issue.  Recommendations: 1.  Follow-up with referring physician. 2.  Continue current management of symptoms.  ___________________________ Laurence Spates FAAPMR Board Certified, American Board of Physical Medicine and Rehabilitation    Nerve Conduction Studies Anti Sensory Summary Table   Stim Site NR Peak (ms) Norm Peak (ms) P-T Amp (V) Norm P-T Amp Site1 Site2 Delta-P (ms) Dist (cm) Vel (m/s) Norm Vel (m/s)  Left Median Acr Palm Anti Sensory (2nd Digit)  33.5C  Wrist    3.3 <3.6 82.5 >10 Wrist Palm 1.5 0.0    Palm    1.8 <2.0 28.2         Left Radial Anti Sensory (Base 1st Digit)  32.8C  Wrist    2.2 <3.1 16.9  Wrist Base 1st Digit 2.2 0.0    Left Ulnar Anti Sensory (5th Digit)  33.4C  Wrist    3.0 <3.7 36.6 >15.0 Wrist 5th Digit 3.0 14.0 47 >38   Motor Summary Table   Stim Site NR Onset (ms) Norm Onset (ms) O-P Amp (mV) Norm O-P Amp Site1 Site2 Delta-0 (ms) Dist (cm) Vel (m/s) Norm Vel (m/s)  Left Median Motor (Abd Poll Brev)  32.7C  Wrist    3.0 <4.2 7.0 >5 Elbow Wrist 2.9 18.5 64 >50  Elbow    5.9  6.4         Left Ulnar Motor (Abd Dig Min)  32.7C  Wrist    2.6 <4.2 8.6 >3 B Elbow Wrist 2.9 19.0 66 >53  B Elbow    5.5  7.7  A Elbow B Elbow 1.2 10.0 83 >53  A Elbow    6.7  7.9          EMG   Side Muscle Nerve Root Ins  Act Fibs Psw Amp Dur Poly Recrt Int Fraser Din Comment  Left 1stDorInt Ulnar C8-T1 Nml Nml Nml Nml Nml 0 Nml Nml   Left Abd Poll Brev Median C8-T1 Nml Nml Nml Nml Nml 0 Nml Nml   Left ExtDigCom   Nml Nml Nml Nml Nml 0 Nml Nml   Left Triceps Radial C6-7-8 Nml Nml Nml Nml Nml 0 Nml Nml   Left Deltoid Axillary C5-6 Nml Nml Nml Nml Nml 0 Nml Nml     Nerve Conduction Studies Anti Sensory Left/Right Comparison   Stim Site L Lat (ms) R Lat (ms) L-R Lat (ms) L Amp (V) R Amp (V) L-R Amp (%) Site1 Site2 L Vel (m/s) R Vel (m/s) L-R Vel (m/s)  Median Acr Palm Anti Sensory (2nd Digit)  33.5C  Wrist 3.3   82.5   Wrist Palm     Palm 1.8   28.2         Radial Anti Sensory (Base 1st Digit)  32.8C  Wrist 2.2   16.9   Wrist Base 1st Digit     Ulnar Anti Sensory (5th Digit)  33.4C  Wrist 3.0   36.6  Wrist 5th Digit 47     Motor Left/Right Comparison   Stim Site L Lat (ms) R Lat (ms) L-R Lat (ms) L Amp (mV) R Amp (mV) L-R Amp (%) Site1 Site2 L Vel (m/s) R Vel (m/s) L-R Vel (m/s)  Median Motor (Abd Poll Brev)  32.7C  Wrist 3.0   7.0   Elbow Wrist 64    Elbow 5.9   6.4         Ulnar Motor (Abd Dig Min)  32.7C  Wrist 2.6   8.6   B Elbow Wrist 66    B Elbow 5.5   7.7   A Elbow B Elbow 83    A Elbow 6.7   7.9            Waveforms:

## 2018-11-30 NOTE — Progress Notes (Signed)
Annette Chang - 48 y.o. female MRN [id number]  Date of birth: [birth year]  Office Visit Note: Visit Date: 11/29/2018 PCP: Bufford Lope, DO Referred by: Bufford Lope, DO  Subjective: Chief Complaint  Patient presents with  . Left Shoulder - Numbness, Weakness, Tingling  . Left Arm - Weakness, Numbness, Tingling  . Left Hand - Numbness, Weakness, Tingling   HPI: Annette Chang is a 48 y.o. female who comes in today At the request of G. Alphonzo Severance for electrodiagnostic study of the left upper limb.  Patient has had a interesting clinical course on the right side with a history of scapular winging with electrodiagnostic study by Dr. 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  She went on to have right shoulder tendon transfer for scapular winging which was unsuccessful.  She is using muscle relaxers and occasional pain medicine.  She reports now developing pain numbness and weakness in the left upper extremity.  She reports that she feels this in the left shoulder arm and hand but is really global and nondermatomal.  She reports it started about a month ago without specific injury.  She reports worsening with limb movement.  She is a very thin individual and continues to wear a sling on the right arm.  ROS Otherwise per HPI.  Assessment & Plan: Visit Diagnoses:  1. Paresthesia of skin     Plan: Impression: Essentially NORMAL electrodiagnostic study of the left upper limb.  There is no significant electrodiagnostic evidence of nerve entrapment, brachial plexopathy or cervical radiculopathy.    As you know, purely sensory or demyelinating radiculopathies and chemical radiculitis may not be detected with this particular electrodiagnostic study.  Also this would not detect a purely demyelinated specific brachial plexus issue.  Recommendations: 1.  Follow-up with referring physician. 2.  Continue current  management of symptoms.   Meds & Orders: No orders of the defined types were placed in this encounter.   Orders Placed This Encounter  Procedures  . NCV with EMG (electromyography)    Follow-up: Return for G. Alphonzo Severance, MD.   Procedures: No procedures performed  EMG & NCV Findings: All nerve conduction studies (as indicated in the following tables) were within normal limits.    All examined muscles (as indicated in the following table) showed no evidence of electrical instability.    Impression: Essentially NORMAL electrodiagnostic study of the left upper limb.  There is no significant electrodiagnostic evidence of nerve entrapment, brachial plexopathy or cervical radiculopathy.    As you know, purely sensory or demyelinating radiculopathies and chemical radiculitis may not be detected with this particular electrodiagnostic study.  Also this would not detect a purely demyelinated specific brachial plexus issue.  Recommendations: 1.  Follow-up with referring physician. 2.  Continue current management of symptoms.  ___________________________ Laurence Spates FAAPMR Board Certified, American Board of Physical Medicine and Rehabilitation    Nerve Conduction Studies Anti Sensory Summary Table   Stim Site NR Peak (ms) Norm Peak (ms) P-T Amp (V) Norm P-T Amp Site1 Site2 Delta-P (ms) Dist (cm) Vel (m/s) Norm Vel (m/s)  Left Median Acr Palm Anti Sensory (2nd Digit)  33.5C  Wrist    3.3 <3.6 82.5 >10 Wrist Palm 1.5 0.0    Palm    1.8 <2.0 28.2         Left Radial Anti Sensory (Base 1st Digit)  32.8C  Wrist  2.2 <3.1 16.9  Wrist Base 1st Digit 2.2 0.0    Left Ulnar Anti Sensory (5th Digit)  33.4C  Wrist    3.0 <3.7 36.6 >15.0 Wrist 5th Digit 3.0 14.0 47 >38   Motor Summary Table   Stim Site NR Onset (ms) Norm Onset (ms) O-P Amp (mV) Norm O-P Amp Site1 Site2 Delta-0 (ms) Dist (cm) Vel (m/s) Norm Vel (m/s)  Left Median Motor (Abd Poll Brev)  32.7C  Wrist    3.0 <4.2 7.0 >5  Elbow Wrist 2.9 18.5 64 >50  Elbow    5.9  6.4         Left Ulnar Motor (Abd Dig Min)  32.7C  Wrist    2.6 <4.2 8.6 >3 B Elbow Wrist 2.9 19.0 66 >53  B Elbow    5.5  7.7  A Elbow B Elbow 1.2 10.0 83 >53  A Elbow    6.7  7.9          EMG   Side Muscle Nerve Root Ins Act Fibs Psw Amp Dur Poly Recrt Int Fraser Din Comment  Left 1stDorInt Ulnar C8-T1 Nml Nml Nml Nml Nml 0 Nml Nml   Left Abd Poll Brev Median C8-T1 Nml Nml Nml Nml Nml 0 Nml Nml   Left ExtDigCom   Nml Nml Nml Nml Nml 0 Nml Nml   Left Triceps Radial C6-7-8 Nml Nml Nml Nml Nml 0 Nml Nml   Left Deltoid Axillary C5-6 Nml Nml Nml Nml Nml 0 Nml Nml     Nerve Conduction Studies Anti Sensory Left/Right Comparison   Stim Site L Lat (ms) R Lat (ms) L-R Lat (ms) L Amp (V) R Amp (V) L-R Amp (%) Site1 Site2 L Vel (m/s) R Vel (m/s) L-R Vel (m/s)  Median Acr Palm Anti Sensory (2nd Digit)  33.5C  Wrist 3.3   82.5   Wrist Palm     Palm 1.8   28.2         Radial Anti Sensory (Base 1st Digit)  32.8C  Wrist 2.2   16.9   Wrist Base 1st Digit     Ulnar Anti Sensory (5th Digit)  33.4C  Wrist 3.0   36.6   Wrist 5th Digit 47     Motor Left/Right Comparison   Stim Site L Lat (ms) R Lat (ms) L-R Lat (ms) L Amp (mV) R Amp (mV) L-R Amp (%) Site1 Site2 L Vel (m/s) R Vel (m/s) L-R Vel (m/s)  Median Motor (Abd Poll Brev)  32.7C  Wrist 3.0   7.0   Elbow Wrist 64    Elbow 5.9   6.4         Ulnar Motor (Abd Dig Min)  32.7C  Wrist 2.6   8.6   B Elbow Wrist 66    B Elbow 5.5   7.7   A Elbow B Elbow 83    A Elbow 6.7   7.9            Waveforms:            Clinical History: 08/09/2015  IMPRESSION:  This MRI of the cervical spine shows prominent anterior osteophytesat C4-C5, C5-C6 and C6-C7.   Additionally there is disc bulging at C5-C6.   There is no nerve root impingement.   The spinal cord appears normal.   ----------------------------------  INTERPRETING PHYSICIAN:  Richard A. Felecia Shelling, MD, PhD  EMG/NCS IMPRESSION:  Nerve  conduction studies done on the right upper extremity were unremarkable, without evidence of  a peripheral neuropathy. 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 No evidence of a cervical radiculopathy was seen.  Jill Alexanders MD 11/06/2014 11:09 AM  [facility]   She reports that she quit smoking about a year ago. Her smoking use included cigarettes. She quit after 5.00 years of use. She has never used smokeless tobacco. No results for input(s): HGBA1C, LABURIC in the last 8760 hours.  Objective:  VS:  HT:    WT:   BMI:     BP:   HR: bpm  TEMP:98 F (36.7 C)(Oral)  RESP:  Physical Exam Musculoskeletal:        General: No swelling, tenderness or deformity.     Comments: Inspection reveals a very thin individual but with good muscle tone in the left upper extremity and no atrophy of the bilateral APB or FDI or hand intrinsics. There is no swelling, color changes, allodynia or dystrophic changes. There is 5 out of 5 strength in the left wrist extension, finger abduction and long finger flexion.  There is no left scapular winging and there seems to be good strength with left shoulder abduction.  There is intact sensation to light touch in all dermatomal and peripheral nerve distributions. There is a negative Hoffmann's test bilaterally.  Skin:    General: Skin is warm and dry.     Findings: No erythema or rash.  Neurological:     General: No focal deficit present.     Mental Status: She is alert and oriented to person, place, and time.     Motor: No weakness or abnormal muscle tone.     Coordination: Coordination normal.  Psychiatric:        Mood and Affect: Mood normal.        Behavior: Behavior normal.     Ortho Exam Imaging: No results found.  Past Medical/Family/Surgical/Social History: Medications & Allergies  reviewed per EMR, new medications updated. Patient Active Problem List   Diagnosis Date Noted  . Osteoporosis 12/08/2017  . Toe pain, left 12/05/2017  . Anxiety 12/05/2017  . 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 Suicidal ideations 03/30/2016  . MDD (major depressive disorder) 03/29/2016  . Breast pain 01/18/2016  . 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 Chronic urticaria 08/06/2015  . Paresthesia 04/15/2015  . Headache 02/20/2015  . 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 Right shoulder pain 08/22/2014  . DUB (dysfunctional uterine bleeding) 03/11/2014  . H/O rotator cuff surgery 01/02/2012  . 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 Iron deficiency anemia 01/02/2012   Past Medical History:  Diagnosis Date  . Anxiety   . 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 Depression   . H/O rotator cuff surgery 01/02/2012  . H/O tubal ligation 01/02/2012  . H/O: C-section 01/02/2012   3   . H/O: C-section 01/02/2012   3   . Headache(784.0) 01/02/2012  . Iron deficiency anemia 01/02/2012  . 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 Diabetes Maternal Grandmother   . Diabetes Paternal Grandmother   . Diabetes Paternal Grandfather    Past Surgical History:  Procedure Laterality Date  . CESAREAN SECTION     3 previous c sections  . MUSCLE REPAIR Right 01/10/2017   RIGHT PECTORAL MAJOR TO SCAPULA MUSCLE TRANSFER (Right  .  NOVASURE ABLATION N/A 03/11/2014   Procedure: NOVASURE ABLATION;  Surgeon: Emily Filbert, MD;  Location: [facility];  Service: Gynecology;  Laterality: N/A;  . PECTORALIS TENDON REPAIR Right 01/10/2017   Procedure: RIGHT PECTORAL MAJOR TO SCAPULA MUSCLE TRANSFER;  Surgeon: Meredith Pel, MD;  Location: [facility];  Service: Orthopedics;  Laterality: Right;  . PECTORALIS TENDON REPAIR Right 12/28/2017   Procedure: RIGHT REVISION TENDON TRANSFER OF PECTORALIS MAJOR;  Surgeon: Meredith Pel, MD;  Location: [facility];  Service: Orthopedics;  Laterality: Right;  . rotator cuff surgery    . SHOULDER SURGERY    . TUBAL LIGATION     Social History   Occupational History  . Occupation: unemployed  Tobacco Use  . Smoking status: Former Smoker    Years: 5.00    Types: Cigarettes    Last attempt to quit: 11/2017    Years since quitting: 1.0  . Smokeless tobacco: Never Used  Substance and Sexual Activity  . Alcohol use: Yes    Alcohol/week: 14.0 standard drinks    Types: 14 Glasses of wine per week    Comment: weekends  . 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 Sexual activity: Not Currently    Birth control/protection: Surgical

## 2018-12-05 ENCOUNTER — Encounter: Payer: Self-pay | Admitting: Orthopedic Surgery

## 2018-12-05 ENCOUNTER — Ambulatory Visit (INDEPENDENT_AMBULATORY_CARE_PROVIDER_SITE_OTHER): Payer: Medicaid Other | Admitting: Orthopedic Surgery

## 2018-12-05 ENCOUNTER — Other Ambulatory Visit: Payer: Self-pay

## 2018-12-05 DIAGNOSIS — R202 Paresthesia of skin: Secondary | ICD-10-CM | POA: Diagnosis not present

## 2018-12-05 DIAGNOSIS — R200 Anesthesia of skin: Secondary | ICD-10-CM | POA: Diagnosis not present

## 2018-12-05 NOTE — Progress Notes (Signed)
Office Visit Note   Patient: Annette Chang           Date of Birth: [birth year]           MRN: [id number] Visit Date: 12/05/2018 Requested by: Bufford Lope, DO [place], [place] [address] PCP: Bufford Lope, DO  Subjective: Chief Complaint  Patient presents with  . Follow-up    HPI: Patient presents for follow-up of left arm numbness and tingling and weakness.  EMG nerve study done and was normal.  She is on muscle relaxer and Norco.  She takes Norco about every other day and that is for her right arm.  She is currently not working.              ROS: All systems reviewed are negative as they relate to the chief complaint within the history of present illness.  Patient denies  fevers or chills.   Assessment & Plan: Visit Diagnoses:  1. Numbness and tingling in left arm     Plan: Impression is numbness and tingling and subjective weakness in that left arm but with normal nerve conduction and EMG.  Plan is observation for now.  Cautioned her about taking too much of the narcotic pain medicine but she is tried anti-inflammatories and they do not help too much.  I am going to release her at this time.  I think she may need pain medicine occasionally for that right arm.  Scapulothoracic fusion would be her last surgical option but I do not think she is a great surgical candidate based on her overall appearance and possible nutritional status and deficiency.  I will see her back as needed.  Follow-Up Instructions: Return if symptoms worsen or fail to improve.   Orders:  No orders of the defined types were placed in this encounter.  No orders of the defined types were placed in this encounter.     Procedures: No procedures performed   Clinical Data: No additional findings.  Objective: Vital Signs: There were no vitals taken for this visit.  Physical Exam:   Constitutional: Patient appears well-developed HEENT:  Head: Normocephalic Eyes:EOM are normal Neck:  Normal range of motion Cardiovascular: Normal rate Pulmonary/chest: Effort normal Neurologic: Patient is alert Skin: Skin is warm Psychiatric: Patient has normal mood and affect    Ortho Exam: Ortho exam demonstrates pretty good cervical spine range of motion.  Bilateral grip is symmetric but diminished.  Radial pulse intact bilaterally.  Patient has good wrist dorsiflexion and palmar flexion.  Biceps triceps and deltoid strength is intact.  Specialty Comments:  No specialty comments available.  Imaging: No results found.   PMFS History: Patient Active Problem List   Diagnosis Date Noted  . Osteoporosis 12/08/2017  . Toe pain, left 12/05/2017  . Anxiety 12/05/2017  . 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 Suicidal ideations 03/30/2016  . MDD (major depressive disorder) 03/29/2016  . Breast pain 01/18/2016  . 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 Chronic urticaria 08/06/2015  . Paresthesia 04/15/2015  . Headache 02/20/2015  . 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 Right shoulder pain 08/22/2014  . DUB (dysfunctional uterine bleeding) 03/11/2014  . H/O rotator cuff surgery 01/02/2012  . 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 Iron deficiency anemia 01/02/2012   Past Medical History:  Diagnosis Date  . Anxiety   . 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 Depression   . H/O rotator cuff surgery 01/02/2012  .  H/O tubal ligation 01/02/2012  . H/O: C-section 01/02/2012   3   . H/O: C-section 01/02/2012   3   . Headache(784.0) 01/02/2012  . Iron deficiency anemia 01/02/2012  . 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 Diabetes Maternal Grandmother   . Diabetes Paternal Grandmother   . Diabetes Paternal Grandfather     Past Surgical History:  Procedure Laterality Date  . CESAREAN SECTION     3 previous c sections  . MUSCLE REPAIR Right 01/10/2017   RIGHT PECTORAL MAJOR TO SCAPULA MUSCLE TRANSFER  (Right  . NOVASURE ABLATION N/A 03/11/2014   Procedure: NOVASURE ABLATION;  Surgeon: Emily Filbert, MD;  Location: [facility];  Service: Gynecology;  Laterality: N/A;  . PECTORALIS TENDON REPAIR Right 01/10/2017   Procedure: RIGHT PECTORAL MAJOR TO SCAPULA MUSCLE TRANSFER;  Surgeon: Meredith Pel, MD;  Location: [facility];  Service: Orthopedics;  Laterality: Right;  . PECTORALIS TENDON REPAIR Right 12/28/2017   Procedure: RIGHT REVISION TENDON TRANSFER OF PECTORALIS MAJOR;  Surgeon: Meredith Pel, MD;  Location: [facility];  Service: Orthopedics;  Laterality: Right;  . rotator cuff surgery    . SHOULDER SURGERY    . TUBAL LIGATION     Social History   Occupational History  . Occupation: unemployed  Tobacco Use  . Smoking status: Former Smoker    Years: 5.00    Types: Cigarettes    Last attempt to quit: 11/2017    Years since quitting: 1.0  . Smokeless tobacco: Never Used  Substance and Sexual Activity  . Alcohol use: Yes    Alcohol/week: 14.0 standard drinks    Types: 14 Glasses of wine per week    Comment: weekends  . 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 Sexual activity: Not Currently    Birth control/protection: Surgical

## 2018-12-10 ENCOUNTER — Ambulatory Visit: Payer: Self-pay

## 2018-12-10 ENCOUNTER — Ambulatory Visit: Payer: Medicaid Other

## 2018-12-27 ENCOUNTER — Telehealth: Payer: Self-pay | Admitting: Orthopedic Surgery

## 2018-12-27 NOTE — Telephone Encounter (Signed)
Patient called and requested a refill for ROBAXIN & Hydrocodone.  Please call patient to advise  [phone]

## 2018-12-27 NOTE — Telephone Encounter (Signed)
Please advise. Thanks.  

## 2018-12-28 MED ORDER — HYDROCODONE-ACETAMINOPHEN 5-325 MG PO TABS: ORAL_TABLET | ORAL | 0 refills | Status: DC

## 2018-12-28 MED ORDER — METHOCARBAMOL 500 MG PO TABS: 500.0000 mg | ORAL_TABLET | Freq: Two times a day (BID) | ORAL | 0 refills | Status: DC

## 2018-12-28 NOTE — Telephone Encounter (Signed)
IC LM advising rx written Can pick up at front desk.

## 2018-12-28 NOTE — Telephone Encounter (Signed)
Ok to rf pls cla lthx

## 2019-01-23 ENCOUNTER — Ambulatory Visit: Payer: Medicaid Other | Admitting: Orthopedic Surgery

## 2019-01-24 ENCOUNTER — Ambulatory Visit: Payer: Medicaid Other | Admitting: Family Medicine

## 2019-01-24 ENCOUNTER — Ambulatory Visit: Payer: Medicaid Other

## 2019-01-28 ENCOUNTER — Telehealth: Payer: Self-pay | Admitting: Orthopedic Surgery

## 2019-01-28 ENCOUNTER — Other Ambulatory Visit: Payer: Self-pay | Admitting: Orthopedic Surgery

## 2019-01-28 MED ORDER — HYDROCODONE-ACETAMINOPHEN 5-325 MG PO TABS: ORAL_TABLET | ORAL | 0 refills | Status: DC

## 2019-01-28 MED ORDER — METHOCARBAMOL 500 MG PO TABS: 500.0000 mg | ORAL_TABLET | Freq: Three times a day (TID) | ORAL | 0 refills | Status: DC | PRN

## 2019-01-28 NOTE — Telephone Encounter (Signed)
Ok to rf pls clal htx

## 2019-01-28 NOTE — Telephone Encounter (Signed)
Patient called in wanting to know if she could pick up script for HYDROcodone-acetaminophen (NORCO) 10-325 MG tablet and for methocarbamol (ROBAXIN) 500 MG tablet..... Patient is also using [facility] I advised patient that she may not be able to pick up script but if approved they will be sent to pharmacy.  Please advise [phone]

## 2019-01-28 NOTE — Telephone Encounter (Signed)
Submitted to pharmacy 

## 2019-01-28 NOTE — Telephone Encounter (Signed)
Please advise. Thanks.  

## 2019-02-08 ENCOUNTER — Telehealth: Payer: Self-pay | Admitting: Orthopedic Surgery

## 2019-02-08 NOTE — Telephone Encounter (Signed)
Patient called and  Wanted t know if there is anything else you can do for her right arm?  Please call patient [phone]

## 2019-02-08 NOTE — Telephone Encounter (Signed)
PLEASE ADVISE.

## 2019-02-09 NOTE — Telephone Encounter (Signed)
Unfortunately  not

## 2019-02-11 NOTE — Telephone Encounter (Signed)
Unable to use the arm at all. Said she cant be left in this kind of situation. She said that something has to be done. Are there any other options for her?

## 2019-02-11 NOTE — Telephone Encounter (Signed)
No other surgical options unfortunately.

## 2019-02-12 NOTE — Telephone Encounter (Signed)
Tried calling, number not accepting calls other number no answer. Can discuss should patient call back.

## 2019-02-13 ENCOUNTER — Telehealth: Payer: Self-pay | Admitting: Orthopedic Surgery

## 2019-02-13 ENCOUNTER — Other Ambulatory Visit: Payer: Self-pay | Admitting: Orthopedic Surgery

## 2019-02-13 MED ORDER — HYDROCODONE-ACETAMINOPHEN 5-325 MG PO TABS: ORAL_TABLET | ORAL | 0 refills | Status: DC

## 2019-02-13 NOTE — Telephone Encounter (Signed)
Submitted to pharmacy 

## 2019-02-13 NOTE — Telephone Encounter (Signed)
Ok to rf? 

## 2019-02-13 NOTE — Telephone Encounter (Signed)
Patient called needing Rx refilled Hydrocodone. Patient asked if she can get the Rx today. Patient can not come in the office because she is in quarantine. The number to contact patient is [phone]

## 2019-02-13 NOTE — Telephone Encounter (Signed)
Please advise. Thanks.  

## 2019-02-27 ENCOUNTER — Ambulatory Visit: Payer: Medicaid Other | Admitting: Family Medicine

## 2019-02-27 NOTE — Progress Notes (Deleted)
    Subjective:  Annette Chang is a 48 y.o. female who presents to the [facility] today with a chief complaint of ***.   HPI:   ***HIST  CC, SH/smoking status, and VS noted  Objective:  Physical Exam: There were no vitals taken for this visit.  Gen: ***NAD, resting comfortably CV: RRR with no murmurs appreciated Pulm: NWOB, CTAB with no crackles, wheezes, or rhonchi GI: Normal bowel sounds present. Soft, Nontender, Nondistended. MSK: no edema, cyanosis, or clubbing noted Skin: warm, dry Neuro: grossly normal, moves all extremities Psych: Normal affect and thought content  No results found for this or any previous visit (from the past 72 hour(s)).   Assessment/Plan:  No problem-specific Assessment & Plan notes found for this encounter.    No orders of the defined types were placed in this encounter.   No orders of the defined types were placed in this encounter.   Health Maintenance reviewed - {health maintenance:315237}.  Lyndee Hensen, DO PGY-1, [facility] 02/27/2019 8:41 AM

## 2019-03-04 ENCOUNTER — Ambulatory Visit: Payer: Medicaid Other | Admitting: Orthopedic Surgery

## 2019-03-06 ENCOUNTER — Ambulatory Visit: Payer: Medicaid Other | Admitting: Family Medicine

## 2019-03-11 ENCOUNTER — Encounter: Payer: Self-pay | Admitting: Orthopedic Surgery

## 2019-03-11 ENCOUNTER — Ambulatory Visit (INDEPENDENT_AMBULATORY_CARE_PROVIDER_SITE_OTHER): Payer: Medicaid Other | Admitting: Orthopedic Surgery

## 2019-03-11 ENCOUNTER — Ambulatory Visit (INDEPENDENT_AMBULATORY_CARE_PROVIDER_SITE_OTHER): Payer: Medicaid Other

## 2019-03-11 DIAGNOSIS — M25511 Pain in right shoulder: Secondary | ICD-10-CM

## 2019-03-11 MED ORDER — HYDROCODONE-ACETAMINOPHEN 5-325 MG PO TABS: ORAL_TABLET | ORAL | 0 refills | Status: DC

## 2019-03-13 ENCOUNTER — Encounter: Payer: Self-pay | Admitting: Orthopedic Surgery

## 2019-03-13 NOTE — Progress Notes (Signed)
Office Visit Note   Patient: Annette Chang           Date of Birth: 03/06/1971           MRN: [id number] Visit Date: 03/11/2019 Requested by: [name], MD [address],  [place] [address] PCP: [name], MD  Subjective: Chief Complaint  Patient presents with  . Right Shoulder - Pain    HPI: Patient presents with continued pain in her right arm.  She is had 2 surgeries to try to address her scapular winging.  Neither of them worked for a long duration.  For surgery did help for about 9 months second surgery helped for about 6 months then the patient reported recurrent deformity and loss of function.  Patient has good and bad days.  She does take pain medicine about once a day.  The left arm symptoms come and go.  She has had a normal nerve conduction study on that side.  Patient has diminished functional overhead motion with that right arm.              ROS: All systems reviewed are negative as they relate to the chief complaint within the history of present illness.  Patient denies  fevers or chills.   Assessment & Plan: Visit Diagnoses:  1. Right shoulder pain, unspecified chronicity     Plan: Impression is right shoulder pain.  I do not think she is particularly functional with that right arm at this time.  I also do not think that further surgery which would be scapulothoracic fusion would be predictably helpful in this case.  The scapula would have to essentially be skeletonized and I am not sure if the patient has the nutritional and overall health ability to heal that operation.  I think her best option at this time in order to not make a bad situation worse would be to live with what she has.  I will see her back as needed.  I did refill her pain medicine today.  Follow-Up Instructions: Return if symptoms worsen or fail to improve.   Orders:  Orders Placed This Encounter  Procedures  . XR Shoulder Right   Meds ordered this encounter  Medications  .  HYDROcodone-acetaminophen (NORCO/VICODIN) 5-325 MG tablet    Sig: 1 po q 12hrs  prn pain    Dispense:  40 tablet    Refill:  0      Procedures: No procedures performed   Clinical Data: No additional findings.  Objective: Vital Signs: There were no vitals taken for this visit.  Physical Exam:   Constitutional: Patient appears well-developed HEENT:  Head: Normocephalic Eyes:EOM are normal Neck: Normal range of motion Cardiovascular: Normal rate Pulmonary/chest: Effort normal Neurologic: Patient is alert Skin: Skin is warm Psychiatric: Patient has normal mood and affect    Ortho Exam: Ortho exam demonstrates intact motor sensory function to both hands.  Radial pulse intact.  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  She has diminished forward flexion abduction below 90 degrees.  Not much in the way of coarse grinding or crepitus with active or passive range of motion of that shoulder.  Specialty Comments:  No specialty comments available.  Imaging: No results found.   PMFS History: Patient Active Problem List   Diagnosis Date Noted  . Osteoporosis 12/08/2017  . 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 MDD (major depressive disorder) 03/29/2016  . Breast pain 01/18/2016  . 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  Chronic urticaria 08/06/2015  . Paresthesia 04/15/2015  . 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 Right shoulder pain 08/22/2014  . DUB (dysfunctional uterine bleeding) 03/11/2014  . H/O rotator cuff surgery 01/02/2012  . 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 Iron deficiency anemia 01/02/2012   Past Medical History:  Diagnosis Date  . Anxiety   . 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 Depression   . H/O rotator cuff surgery 01/02/2012  . H/O tubal ligation 01/02/2012  . H/O: C-section 01/02/2012   3   . H/O: C-section 01/02/2012   3   . Headache(784.0) 01/02/2012  . Iron deficiency anemia 01/02/2012  .  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 Diabetes Maternal Grandmother   . Diabetes Paternal Grandmother   . Diabetes Paternal Grandfather     Past Surgical History:  Procedure Laterality Date  . CESAREAN SECTION     3 previous c sections  . MUSCLE REPAIR Right 01/10/2017   RIGHT PECTORAL MAJOR TO SCAPULA MUSCLE TRANSFER (Right  . NOVASURE ABLATION N/A 03/11/2014   Procedure: NOVASURE ABLATION;  Surgeon: Emily Filbert, MD;  Location: [facility];  Service: Gynecology;  Laterality: N/A;  . PECTORALIS TENDON REPAIR Right 01/10/2017   Procedure: RIGHT PECTORAL MAJOR TO SCAPULA MUSCLE TRANSFER;  Surgeon: Meredith Pel, MD;  Location: [facility];  Service: Orthopedics;  Laterality: Right;  . PECTORALIS TENDON REPAIR Right 12/28/2017   Procedure: RIGHT REVISION TENDON TRANSFER OF PECTORALIS MAJOR;  Surgeon: Meredith Pel, MD;  Location: [facility];  Service: Orthopedics;  Laterality: Right;  . rotator cuff surgery    . SHOULDER SURGERY    . TUBAL LIGATION     Social History   Occupational History  . Occupation: unemployed  Tobacco Use  . Smoking status: Former Smoker    Years: 5.00    Types: Cigarettes    Quit date: 11/2017    Years since quitting: 1.3  . Smokeless tobacco: Never Used  Substance and Sexual Activity  . Alcohol use: Yes    Alcohol/week: 14.0 standard drinks    Types: 14 Glasses of wine per week    Comment: weekends  . 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 Sexual activity: Not Currently    Birth control/protection: Surgical

## 2019-04-08 ENCOUNTER — Telehealth: Payer: Self-pay | Admitting: Orthopedic Surgery

## 2019-04-08 ENCOUNTER — Other Ambulatory Visit: Payer: Self-pay | Admitting: Surgical

## 2019-04-08 MED ORDER — HYDROCODONE-ACETAMINOPHEN 5-325 MG PO TABS: 1.0000 | ORAL_TABLET | Freq: Every day | ORAL | 0 refills | Status: DC | PRN

## 2019-04-08 MED ORDER — METHOCARBAMOL 500 MG PO TABS: 500.0000 mg | ORAL_TABLET | Freq: Two times a day (BID) | ORAL | 0 refills | Status: DC

## 2019-04-08 NOTE — Telephone Encounter (Signed)
Patient called for refill of Hydrocodone & Robaxin. Please call patient to advise.  [phone]

## 2019-04-08 NOTE — Telephone Encounter (Signed)
Okay to refill x1 for 6 weeks.  Thanks

## 2019-04-08 NOTE — Telephone Encounter (Signed)
Can you please submit?

## 2019-04-08 NOTE — Telephone Encounter (Signed)
Please advise. Thanks.  

## 2019-04-30 ENCOUNTER — Telehealth: Payer: Self-pay | Admitting: Orthopedic Surgery

## 2019-04-30 ENCOUNTER — Other Ambulatory Visit: Payer: Self-pay | Admitting: Surgical

## 2019-04-30 MED ORDER — HYDROCODONE-ACETAMINOPHEN 5-325 MG PO TABS: 1.0000 | ORAL_TABLET | Freq: Every day | ORAL | 0 refills | Status: DC | PRN

## 2019-04-30 MED ORDER — METHOCARBAMOL 500 MG PO TABS: 500.0000 mg | ORAL_TABLET | Freq: Two times a day (BID) | ORAL | 0 refills | Status: DC

## 2019-04-30 NOTE — Telephone Encounter (Signed)
Ok to rf pls send in thx

## 2019-04-30 NOTE — Telephone Encounter (Signed)
Sent in to pharmacy.  

## 2019-04-30 NOTE — Telephone Encounter (Signed)
Please advise. Thanks.  If ok to rf please send to [facility] so he can send.

## 2019-04-30 NOTE — Telephone Encounter (Signed)
Pt called in requesting a refill on methocarbamol, hydrocodone. Please have that sent to [facility] on [address].   [phone]

## 2019-05-01 ENCOUNTER — Other Ambulatory Visit: Payer: Self-pay | Admitting: Family Medicine

## 2019-05-03 NOTE — Telephone Encounter (Signed)
Spoke with pts neighbor [name], which who # is on file as contact. Had her take a message of note, and to have pt call to make a f/up appt. Salvatore Marvel, CMA

## 2019-05-03 NOTE — Telephone Encounter (Signed)
Hey team,  Please call patient to schedule for follow up appointment, I will provide 1 month refill to give them time to get in so they don't run out of medication.  Thank you for all that you do!  Levent Kornegay

## 2019-06-03 ENCOUNTER — Ambulatory Visit (INDEPENDENT_AMBULATORY_CARE_PROVIDER_SITE_OTHER): Payer: Medicaid Other

## 2019-06-03 ENCOUNTER — Ambulatory Visit (INDEPENDENT_AMBULATORY_CARE_PROVIDER_SITE_OTHER): Payer: Medicaid Other | Admitting: Orthopedic Surgery

## 2019-06-03 ENCOUNTER — Encounter: Payer: Self-pay | Admitting: Orthopedic Surgery

## 2019-06-03 ENCOUNTER — Other Ambulatory Visit: Payer: Self-pay

## 2019-06-03 VITALS — Ht 61.0 in | Wt 98.0 lb

## 2019-06-03 DIAGNOSIS — M792 Neuralgia and neuritis, unspecified: Secondary | ICD-10-CM

## 2019-06-03 DIAGNOSIS — R202 Paresthesia of skin: Secondary | ICD-10-CM | POA: Diagnosis not present

## 2019-06-03 DIAGNOSIS — R200 Anesthesia of skin: Secondary | ICD-10-CM

## 2019-06-03 DIAGNOSIS — R29898 Other symptoms and signs involving the musculoskeletal system: Secondary | ICD-10-CM | POA: Diagnosis not present

## 2019-06-04 ENCOUNTER — Telehealth: Payer: Self-pay | Admitting: Orthopedic Surgery

## 2019-06-04 ENCOUNTER — Encounter: Payer: Self-pay | Admitting: Orthopedic Surgery

## 2019-06-04 MED ORDER — HYDROCODONE-ACETAMINOPHEN 5-325 MG PO TABS: 1.0000 | ORAL_TABLET | Freq: Every day | ORAL | 0 refills | Status: DC | PRN

## 2019-06-04 MED ORDER — METHOCARBAMOL 500 MG PO TABS: 500.0000 mg | ORAL_TABLET | Freq: Two times a day (BID) | ORAL | 0 refills | Status: DC

## 2019-06-04 NOTE — Telephone Encounter (Signed)
Patient called to check on the status of her RX.  She has not heard anything from her pharmacy.  CB#[phone].  Thank you.

## 2019-06-04 NOTE — Telephone Encounter (Signed)
Please advise on status of medication. Thanks.

## 2019-06-04 NOTE — Progress Notes (Signed)
Office Visit Note   Patient: Annette Chang           Date of Birth: 1971/05/21           MRN: [id number] Visit Date: 06/03/2019 Requested by: [name], DO [address],  [place] [address] PCP: [name], DO  Subjective: Chief Complaint  Patient presents with  . Left Shoulder - Pain    HPI: Annette Chang is a 48 y.o. female who presents to the office complaining of left shoulder/arm pain.  Patient notes that she woke up 2 days ago and noticed she had left arm weakness and pain.  She denies any injury the day prior.  She cannot recall exactly which she was doing the day before she woke up with the symptoms.  Patient reports weakness with lifting small objects and states that she has dropping things much more than she normally does.  She notes pain throughout the left shoulder that radiates down into her hand.  She also notes numbness and tingling about every other day into the hand.  She notes associated neck pain but denies any scapular pain, burning sensation.  She denies any stiffness of the shoulder but notes grinding.  Patient is taking hydrocodone and Robaxin with some relief.  She has a long history of right shoulder pain and weakness with history of long thoracic nerve lesion on the right side.  She has had 2 surgeries on the right shoulder in order to address scapular winging.  The surgeries did not fix the longstanding scapular winging              ROS:  All systems reviewed are negative as they relate to the chief complaint within the history of present illness.  Patient denies fevers or chills.  Assessment & Plan: Visit Diagnoses:  1. Radicular pain in left arm   2. Numbness and tingling in left arm   3. Left arm weakness     Plan: Patient is a 48 year old female who presents complaining of 2 days of left arm pain.  With patient's pain extending from neck to fingers, numbness/tingling, weakness  impression is likely cervical pathology.  X-ray of the  left shoulder was unremarkable today in clinic.  X-ray of the cervical spine reveals mild degenerative changes with some anterior osteophytes of the vertebral bodies.  Ordered MRI of the cervical spine to evaluate for left-sided radiculopathy.  Patient will follow-up after MRI to review results.  Also provided patient with sling.  Follow-Up Instructions: No follow-ups on file.   Orders:  Orders Placed This Encounter  Procedures  . XR Shoulder Left  . XR Cervical Spine 2 or 3 views  . MR Cervical Spine w/o contrast   Meds ordered this encounter  Medications  . methocarbamol (ROBAXIN) 500 MG tablet    Sig: Take 1 tablet (500 mg total) by mouth every 12 (twelve) hours.    Dispense:  30 tablet    Refill:  0  . HYDROcodone-acetaminophen (NORCO/VICODIN) 5-325 MG tablet    Sig: Take 1 tablet by mouth daily as needed for moderate pain.    Dispense:  20 tablet    Refill:  0      Procedures: No procedures performed   Clinical Data: No additional findings.  Objective: Vital Signs: Ht 5\' 1"  (1.549 m)   Wt 98 lb (44.5 kg)   BMI 18.52 kg/m   Physical Exam:  Constitutional: Patient appears well-developed HEENT:  Head: Normocephalic Eyes:EOM are normal  Neck: Normal range of motion Cardiovascular: Normal rate Pulmonary/chest: Effort normal Neurologic: Patient is alert Skin: Skin is warm Psychiatric: Patient has normal mood and affect  Ortho Exam:  Left shoulder Exam Able to fully forward flex and abduct shoulder overhead No loss of ER relative to the other shoulder.  Good endpoint with ER No TTP over the [facility] joint or bicipital groove 5/5 motor strength of the subscapularis, supraspinatus, and infraspinatus muscles Negative Hawkins impingement 5/5 grip strength, forearm pronation/supination, and bicep strength    Specialty Comments:  No specialty comments available.  Imaging: No results found.   PMFS History: Patient Active Problem List   Diagnosis Date Noted  .  Osteoporosis 12/08/2017  . 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 MDD (major depressive disorder) 03/29/2016  . Breast pain 01/18/2016  . 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 Chronic urticaria 08/06/2015  . Paresthesia 04/15/2015  . 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 Right shoulder pain 08/22/2014  . DUB (dysfunctional uterine bleeding) 03/11/2014  . H/O rotator cuff surgery 01/02/2012  . 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 Iron deficiency anemia 01/02/2012   Past Medical History:  Diagnosis Date  . Anxiety   . 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 Depression   . H/O rotator cuff surgery 01/02/2012  . H/O tubal ligation 01/02/2012  . H/O: C-section 01/02/2012   3   . H/O: C-section 01/02/2012   3   . Headache(784.0) 01/02/2012  . Iron deficiency anemia 01/02/2012  . 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 Diabetes Maternal Grandmother   . Diabetes Paternal Grandmother   . Diabetes Paternal Grandfather     Past Surgical History:  Procedure Laterality Date  . CESAREAN SECTION     3 previous c sections  . MUSCLE REPAIR Right 01/10/2017   RIGHT PECTORAL MAJOR TO SCAPULA MUSCLE TRANSFER (Right  . NOVASURE ABLATION N/A 03/11/2014   Procedure: NOVASURE ABLATION;  Surgeon: Emily Filbert, MD;  Location: [facility];  Service: Gynecology;  Laterality: N/A;  . PECTORALIS TENDON REPAIR Right 01/10/2017   Procedure: RIGHT PECTORAL MAJOR TO SCAPULA MUSCLE TRANSFER;  Surgeon: Meredith Pel, MD;  Location: [facility];  Service: Orthopedics;  Laterality: Right;  . PECTORALIS TENDON REPAIR Right 12/28/2017   Procedure: RIGHT REVISION TENDON TRANSFER OF PECTORALIS MAJOR;  Surgeon: Meredith Pel, MD;  Location: [facility];  Service: Orthopedics;  Laterality: Right;  . rotator cuff surgery    . SHOULDER SURGERY    . TUBAL LIGATION     Social History   Occupational History  . Occupation: unemployed  Tobacco Use  .  Smoking status: Former Smoker    Years: 5.00    Types: Cigarettes    Quit date: 11/2017    Years since quitting: 1.5  . Smokeless tobacco: Never Used  Substance and Sexual Activity  . Alcohol use: Yes    Alcohol/week: 14.0 standard drinks    Types: 14 Glasses of wine per week    Comment: weekends  . 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 Sexual activity: Not Currently    Birth control/protection: Surgical

## 2019-06-05 NOTE — Telephone Encounter (Signed)
Sent in to her pharmacy yesterday

## 2019-06-24 ENCOUNTER — Telehealth: Payer: Self-pay | Admitting: Orthopedic Surgery

## 2019-06-24 NOTE — Telephone Encounter (Signed)
Patient called and stated that she received letter stating that MRI was not cleared and wanted to know were you still doing this or rewriting ins co to have It done.  Please call patient @ [phone]

## 2019-06-24 NOTE — Telephone Encounter (Signed)
Continue why it was refused.  If she needs to have 6 or 8 weeks of some type of therapy we can arrange that if that is the reason.

## 2019-06-24 NOTE — Telephone Encounter (Signed)
Please advise. Patients insurance denied scan of cervical spine.

## 2019-06-25 NOTE — Telephone Encounter (Signed)
Can you call her and we will start doing a home exercise program of neck range of motion and strengthening exercises and then we will see her back in 6 weeks and try again thanks

## 2019-06-25 NOTE — Telephone Encounter (Signed)
Refused due to not having 6 weeks of failed conservative treatment.

## 2019-06-26 ENCOUNTER — Other Ambulatory Visit: Payer: Self-pay | Admitting: Orthopedic Surgery

## 2019-06-26 NOTE — Telephone Encounter (Signed)
IC LMVM for patient advising was calling her back to discuss below per Dr Marlou Sa.

## 2019-06-27 ENCOUNTER — Other Ambulatory Visit: Payer: Medicaid Other

## 2019-06-27 NOTE — Telephone Encounter (Signed)
Please advise. Thanks.  

## 2019-06-27 NOTE — Telephone Encounter (Signed)
Patient called back and I advised her of the message from Dr. Marlou Sa.  She is wanting to know if she needs to come in to get the exercises that she needs to do.  Also, patient wanted to know if she needs to be seen after doing the exercises for a period of time or make that appointment now.

## 2019-06-28 NOTE — Telephone Encounter (Signed)
IC s/w patient and advised per Dr Marlou Sa. She verbalized understanding. She will schedule ROV in 6 weeks.

## 2019-06-28 NOTE — Telephone Encounter (Signed)
I would call and discuss with her home exercise program of neck flexion extension and rotation as well as shoulder shrugs.  Have her do that for 6 weeks total and then will get the scan.

## 2019-07-06 ENCOUNTER — Encounter: Payer: Self-pay | Admitting: Orthopedic Surgery

## 2019-07-06 DIAGNOSIS — Z1231 Encounter for screening mammogram for malignant neoplasm of breast: Secondary | ICD-10-CM | POA: Diagnosis not present

## 2019-07-08 ENCOUNTER — Telehealth: Payer: Self-pay | Admitting: Orthopedic Surgery

## 2019-07-08 NOTE — Telephone Encounter (Signed)
Patient called. She would like a refill on her hydrocodone. Says she is in a lot of pain. Her call back number is [phone]

## 2019-07-08 NOTE — Telephone Encounter (Signed)
Ok to refill? If so please send to [facility] so can be submitted electronically. Thanks.

## 2019-07-08 NOTE — Telephone Encounter (Signed)
Ok to rf pls clala thx

## 2019-07-09 ENCOUNTER — Other Ambulatory Visit: Payer: Self-pay | Admitting: Surgical

## 2019-07-09 ENCOUNTER — Telehealth: Payer: Self-pay | Admitting: Orthopedic Surgery

## 2019-07-09 MED ORDER — HYDROCODONE-ACETAMINOPHEN 5-325 MG PO TABS: 1.0000 | ORAL_TABLET | Freq: Every day | ORAL | 0 refills | Status: DC | PRN

## 2019-07-09 NOTE — Telephone Encounter (Signed)
See other note Dr Marlou Sa sent you please submit rx. Thanks.

## 2019-07-09 NOTE — Telephone Encounter (Signed)
Pt called in checking on her prescription refill request on Hydrocodone. Please give her a call   [phone]

## 2019-07-11 ENCOUNTER — Telehealth: Payer: Self-pay | Admitting: Orthopedic Surgery

## 2019-07-11 NOTE — Telephone Encounter (Signed)
Patient called and requesting a hydrocodone prescription refill. Pharmacy patient is [facility] on [address]. Patient phone number is [phone].

## 2019-07-11 NOTE — Telephone Encounter (Signed)
IC s/w patient and advised likely needed PA through medicaid and I would look into this for her.

## 2019-07-11 NOTE — Telephone Encounter (Signed)
Submitted on [place] [address] Status is suspended for further review

## 2019-07-22 ENCOUNTER — Ambulatory Visit (INDEPENDENT_AMBULATORY_CARE_PROVIDER_SITE_OTHER): Payer: Medicaid Other | Admitting: Family Medicine

## 2019-07-22 ENCOUNTER — Encounter: Payer: Self-pay | Admitting: Family Medicine

## 2019-07-22 ENCOUNTER — Other Ambulatory Visit: Payer: Self-pay

## 2019-07-22 VITALS — BP 110/74 | HR 66 | Wt 93.4 lb

## 2019-07-22 DIAGNOSIS — Z114 Encounter for screening for human immunodeficiency virus [HIV]: Secondary | ICD-10-CM | POA: Diagnosis not present

## 2019-07-22 DIAGNOSIS — Z13220 Encounter for screening for lipoid disorders: Secondary | ICD-10-CM

## 2019-07-22 DIAGNOSIS — D509 Iron deficiency anemia, unspecified: Secondary | ICD-10-CM | POA: Diagnosis not present

## 2019-07-22 DIAGNOSIS — M25511 Pain in right shoulder: Secondary | ICD-10-CM | POA: Diagnosis not present

## 2019-07-22 DIAGNOSIS — F331 Major depressive disorder, recurrent, moderate: Secondary | ICD-10-CM

## 2019-07-22 DIAGNOSIS — Z0000 Encounter for general adult medical examination without abnormal findings: Secondary | ICD-10-CM | POA: Diagnosis not present

## 2019-07-22 DIAGNOSIS — G8929 Other chronic pain: Secondary | ICD-10-CM | POA: Diagnosis not present

## 2019-07-22 NOTE — Progress Notes (Signed)
   Subjective:    Patient ID: Annette Chang, female    DOB: 10-09-1970, 48 y.o.   MRN: [id number]   CC: Follow-up  HPI:  Depression - taking 25mg  zoloft, doing okay overall with this but has additional stress due to bills and finances. States she "just needs someone to talk to" on occasion when feeling depressed. Doesn't have any close family. No SI or HI. States she had good success with therapist here in the past and would like to get set up for therapy again.  PHQ currently is 10.  Shoulder pain - sees orthopedics, expects a follow up MRI in 6 months after completing physical therapy.  ROS: pertinent noted in the HPI   Pertinent PMH, PSH, FH, SoHx: Hx of anemia  Smoking status - former smoker, quit 81yr ago.  Objective:  BP 110/74   Pulse 66   Wt 93 lb 6.4 oz (42.4 kg)   SpO2 98%   BMI 17.65 kg/m   Vitals and nursing note reviewed  General: NAD, pleasant, able to participate in exam Cardiac: RRR, no murmurs. Respiratory: CTAB Abdomen: Bowel sounds present, nontender, nondistended. Psych: Normal affect and mood   Assessment & Plan:   MDD (major depressive disorder) PHQ 9 score of 10.  Currently taking Zoloft 25 mg/day Plan: -Continue current medications -Discussed with patient return precautions including she can help [place] develop any thoughts of harming herself or others. -We will continue to monitor patient's progress -During next visit can discuss referral for counseling if warranted.  Right shoulder pain Patient currently seeing orthopedics for her shoulder pain. Plan: -We will continue to follow as patient sees her orthopedics physician -Patient states she plans to have a repeat MRI done in the next 6 months per her orthopedist.  -Plan for Pap smear during next appointment   [name], [address] [facility] PGY-1

## 2019-07-22 NOTE — Assessment & Plan Note (Signed)
Patient currently seeing orthopedics for her shoulder pain. Plan: -We will continue to follow as patient sees her orthopedics physician -Patient states she plans to have a repeat MRI done in the next 6 months per her orthopedist.

## 2019-07-22 NOTE — Patient Instructions (Addendum)
It was great to see you!  Our plans for today:  -We are checking some blood work today including cholesterol, blood levels, and electrolytes.  I will call you if anything is worrisome, otherwise we will go over these results on your next appointment in the next 1-2 weeks. - You are due for a pap smear, we can schedule this for the next appointment. -I am glad to hear that you are doing well on your Zoloft.  I would like for you to continue taking this.  If you develop any thoughts of harming yourself or others please seek help immediately.  Take care and seek immediate care sooner if you develop any concerns.   Dr. [facility]

## 2019-07-22 NOTE — Assessment & Plan Note (Signed)
PHQ 9 score of 10.  Currently taking Zoloft 25 mg/day Plan: -Continue current medications -Discussed with patient return precautions including she can help [place] develop any thoughts of harming herself or others. -We will continue to monitor patient's progress -During next visit can discuss referral for counseling if warranted.

## 2019-07-23 LAB — COMPREHENSIVE METABOLIC PANEL
ALT: 25 IU/L (ref 0–32)
AST: 33 IU/L (ref 0–40)
Albumin/Globulin Ratio: 1.6 (ref 1.2–2.2)
Albumin: 4.8 g/dL (ref 3.8–4.8)
Alkaline Phosphatase: 81 IU/L (ref 39–117)
BUN/Creatinine Ratio: 15 (ref 9–23)
BUN: 10 mg/dL (ref 6–24)
Bilirubin Total: 0.3 mg/dL (ref 0.0–1.2)
CO2: 22 mmol/L (ref 20–29)
Calcium: 10.1 mg/dL (ref 8.7–10.2)
Chloride: 100 mmol/L (ref 96–106)
Creatinine, Ser: 0.67 mg/dL (ref 0.57–1.00)
GFR calc Af Amer: 119 mL/min/{1.73_m2} (ref >59)
GFR calc non Af Amer: 104 mL/min/{1.73_m2} (ref >59)
Globulin, Total: 3 g/dL (ref 1.5–4.5)
Glucose: 77 mg/dL (ref 65–99)
Potassium: 3.7 mmol/L (ref 3.5–5.2)
Sodium: 140 mmol/L (ref 134–144)
Total Protein: 7.8 g/dL (ref 6.0–8.5)

## 2019-07-23 LAB — LIPID PANEL
Chol/HDL Ratio: 2.5 ratio (ref 0.0–4.4)
Cholesterol, Total: 213 mg/dL — ABNORMAL HIGH (ref 100–199)
HDL: 85 mg/dL (ref >39)
LDL Chol Calc (NIH): 106 mg/dL — ABNORMAL HIGH (ref 0–99)
Triglycerides: 128 mg/dL (ref 0–149)
VLDL Cholesterol Cal: 22 mg/dL (ref 5–40)

## 2019-07-23 LAB — CBC WITH DIFFERENTIAL/PLATELET
Basophils Absolute: 0.1 10*3/uL (ref 0.0–0.2)
Basos: 1 %
EOS (ABSOLUTE): 0.1 10*3/uL (ref 0.0–0.4)
Eos: 1 %
Hematocrit: 42.8 % (ref 34.0–46.6)
Hemoglobin: 14.9 g/dL (ref 11.1–15.9)
Immature Grans (Abs): 0 10*3/uL (ref 0.0–0.1)
Immature Granulocytes: 0 %
Lymphocytes Absolute: 1.2 10*3/uL (ref 0.7–3.1)
Lymphs: 22 %
MCH: 31 pg (ref 26.6–33.0)
MCHC: 34.8 g/dL (ref 31.5–35.7)
MCV: 89 fL (ref 79–97)
Monocytes Absolute: 0.3 10*3/uL (ref 0.1–0.9)
Monocytes: 6 %
Neutrophils Absolute: 3.9 10*3/uL (ref 1.4–7.0)
Neutrophils: 70 %
Platelets: 196 10*3/uL (ref 150–450)
RBC: 4.81 x10E6/uL (ref 3.77–5.28)
RDW: 12.2 % (ref 11.7–15.4)
WBC: 5.6 10*3/uL (ref 3.4–10.8)

## 2019-07-23 LAB — HIV ANTIBODY (ROUTINE TESTING W REFLEX): HIV Screen 4th Generation wRfx: NONREACTIVE

## 2019-07-24 ENCOUNTER — Telehealth: Payer: Self-pay | Admitting: Family Medicine

## 2019-07-24 NOTE — Telephone Encounter (Signed)
Will forward to MD to advise about patient's lab work.  Alonzo Loving,CMA

## 2019-07-24 NOTE — Telephone Encounter (Signed)
Pt is returning Dr. Morton Amy call. She received a voicemail asking her to call back concerning her lab work. The best call back number is [phone].

## 2019-07-24 NOTE — Progress Notes (Signed)
Called patient to inform her of her lab results.  Discussed with patient that her CBC, CMP, and HIV screen are within normal limits.  Informed her that her LDL is slightly high at 106 and that diet and exercise can help lower this.  Answered patient's questions regarding diet and exercise.

## 2019-07-30 ENCOUNTER — Ambulatory Visit: Payer: Medicaid Other

## 2019-07-31 DIAGNOSIS — H2511 Age-related nuclear cataract, right eye: Secondary | ICD-10-CM | POA: Diagnosis not present

## 2019-07-31 DIAGNOSIS — H538 Other visual disturbances: Secondary | ICD-10-CM | POA: Diagnosis not present

## 2019-07-31 DIAGNOSIS — H5010 Unspecified exotropia: Secondary | ICD-10-CM | POA: Diagnosis not present

## 2019-07-31 DIAGNOSIS — H53032 Strabismic amblyopia, left eye: Secondary | ICD-10-CM | POA: Diagnosis not present

## 2019-08-07 DIAGNOSIS — H538 Other visual disturbances: Secondary | ICD-10-CM | POA: Diagnosis not present

## 2019-08-07 DIAGNOSIS — H5010 Unspecified exotropia: Secondary | ICD-10-CM | POA: Diagnosis not present

## 2019-08-07 DIAGNOSIS — H53032 Strabismic amblyopia, left eye: Secondary | ICD-10-CM | POA: Diagnosis not present

## 2019-08-09 DIAGNOSIS — H5213 Myopia, bilateral: Secondary | ICD-10-CM | POA: Diagnosis not present

## 2019-08-15 ENCOUNTER — Ambulatory Visit: Payer: Medicaid Other | Admitting: Family Medicine

## 2019-08-20 ENCOUNTER — Telehealth: Payer: Self-pay | Admitting: Orthopedic Surgery

## 2019-08-20 ENCOUNTER — Other Ambulatory Visit: Payer: Self-pay | Admitting: Surgical

## 2019-08-20 MED ORDER — METHOCARBAMOL 500 MG PO TABS: 500.0000 mg | ORAL_TABLET | Freq: Two times a day (BID) | ORAL | 0 refills | Status: DC

## 2019-08-20 NOTE — Telephone Encounter (Signed)
Patient called. She would like a refill on hydrocodone and robaxin. Her call back number is [phone]

## 2019-08-20 NOTE — Telephone Encounter (Signed)
Pls advise. thx

## 2019-08-21 NOTE — Telephone Encounter (Signed)
Tried calling to advise per [facility], no answer. No VM to LM

## 2019-08-23 NOTE — Telephone Encounter (Signed)
Patient has scheduled appt 

## 2019-08-26 ENCOUNTER — Encounter: Payer: Self-pay | Admitting: Orthopedic Surgery

## 2019-08-26 ENCOUNTER — Other Ambulatory Visit: Payer: Self-pay

## 2019-08-26 ENCOUNTER — Ambulatory Visit (INDEPENDENT_AMBULATORY_CARE_PROVIDER_SITE_OTHER): Payer: Medicaid Other | Admitting: Orthopedic Surgery

## 2019-08-26 DIAGNOSIS — M792 Neuralgia and neuritis, unspecified: Secondary | ICD-10-CM

## 2019-08-26 DIAGNOSIS — R29898 Other symptoms and signs involving the musculoskeletal system: Secondary | ICD-10-CM | POA: Diagnosis not present

## 2019-08-26 MED ORDER — HYDROCODONE-ACETAMINOPHEN 5-325 MG PO TABS: 1.0000 | ORAL_TABLET | Freq: Every day | ORAL | 0 refills | Status: DC | PRN

## 2019-08-31 ENCOUNTER — Encounter: Payer: Self-pay | Admitting: Orthopedic Surgery

## 2019-08-31 NOTE — Progress Notes (Signed)
Office Visit Note   Patient: Annette Chang           Date of Birth: 09-22-1970           MRN: [id number] Visit Date: 08/26/2019 Requested by: [name], DO [address],  [place] [id number] PCP: [name], DO  Subjective: Chief Complaint  Patient presents with  . Neck - Pain    HPI: Annette Chang is a 49 y.o. female who presents to the office complaining of neck pain.  She returns following her last office visit on 06/03/2019.  She has been waiting for approval of MRI but in the meantime has been doing a home exercise program consisting of neck range of motion exercises among other sorts of exercises.  She notes worsening pain of the left arm with "whole arm numbness".  She notes increased clumsiness of the arm every once in a while with dropping objects.  She has significant weakness of that arm as well.  She has difficulty sleeping on her left side.  She describes numbness and tingling in all 5 fingers of the left arm and this pain wakes her up at night.  She is taking methocarbamol and hydrocodone for pain control..                ROS:  All systems reviewed are negative as they relate to the chief complaint within the history of present illness.  Patient denies fevers or chills.  Assessment & Plan: Visit Diagnoses:  1. Radicular pain in left arm   2. Weakness of left arm     Plan: Patient is a 49 year old female who presents complaining of neck pain with numbness of the entire left upper extremity as well as weakness.  She has been doing a home exercise program since her last office visit with no relief.  Her pain is waking her up at night and making life generally difficult.  On exam, she does have some impressive weakness of the left tricep when compared with the contralateral side.  Ordered MRI of the cervical spine to evaluate for left upper extremity weakness as well as radicular symptoms and neck pain.  Follow-Up Instructions: No follow-ups on file.    Orders:  Orders Placed This Encounter  Procedures  . MR Cervical Spine w/o contrast   Meds ordered this encounter  Medications  . HYDROcodone-acetaminophen (NORCO/VICODIN) 5-325 MG tablet    Sig: Take 1 tablet by mouth daily as needed for moderate pain.    Dispense:  20 tablet    Refill:  0      Procedures: No procedures performed   Clinical Data: No additional findings.  Objective: Vital Signs: There were no vitals taken for this visit.  Physical Exam:  Constitutional: Patient appears well-developed HEENT:  Head: Normocephalic Eyes:EOM are normal Neck: Normal range of motion Cardiovascular: Normal rate Pulmonary/chest: Effort normal Neurologic: Patient is alert Skin: Skin is warm Psychiatric: Patient has normal mood and affect  Ortho Exam:  5/5 motor strength of the grip, finger abduction, pronation, supination, bicep flexion, deltoid.  Marked weakness of the left triceps.  Tenderness to palpation throughout the axial cervical spine.  No tenderness to palpation throughout the paraspinal musculature.  Sensation diminished palmarly of all 5 fingers.  Specialty Comments:  No specialty comments available.  Imaging: No results found.   PMFS History: Patient Active Problem List   Diagnosis Date Noted  . Osteoporosis 12/08/2017  . 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 MDD (major depressive  disorder) 03/29/2016  . Breast pain 01/18/2016  . 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 Chronic urticaria 08/06/2015  . Paresthesia 04/15/2015  . 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 Right shoulder pain 08/22/2014  . DUB (dysfunctional uterine bleeding) 03/11/2014  . H/O rotator cuff surgery 01/02/2012  . 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 Iron deficiency anemia 01/02/2012   Past Medical History:  Diagnosis Date  . Anxiety   . 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 Depression   . H/O rotator cuff surgery 01/02/2012  . H/O tubal ligation  01/02/2012  . H/O: C-section 01/02/2012   3   . H/O: C-section 01/02/2012   3   . Headache(784.0) 01/02/2012  . Iron deficiency anemia 01/02/2012  . 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 Diabetes Maternal Grandmother   . Diabetes Paternal Grandmother   . Diabetes Paternal Grandfather     Past Surgical History:  Procedure Laterality Date  . CESAREAN SECTION     3 previous c sections  . MUSCLE REPAIR Right 01/10/2017   RIGHT PECTORAL MAJOR TO SCAPULA MUSCLE TRANSFER (Right  . NOVASURE ABLATION N/A 03/11/2014   Procedure: NOVASURE ABLATION;  Surgeon: Emily Filbert, MD;  Location: [facility];  Service: Gynecology;  Laterality: N/A;  . PECTORALIS TENDON REPAIR Right 01/10/2017   Procedure: RIGHT PECTORAL MAJOR TO SCAPULA MUSCLE TRANSFER;  Surgeon: Meredith Pel, MD;  Location: [facility];  Service: Orthopedics;  Laterality: Right;  . PECTORALIS TENDON REPAIR Right 12/28/2017   Procedure: RIGHT REVISION TENDON TRANSFER OF PECTORALIS MAJOR;  Surgeon: Meredith Pel, MD;  Location: [facility];  Service: Orthopedics;  Laterality: Right;  . rotator cuff surgery    . SHOULDER SURGERY    . TUBAL LIGATION     Social History   Occupational History  . Occupation: unemployed  Tobacco Use  . Smoking status: Former Smoker    Years: 5.00    Types: Cigarettes    Quit date: 11/2017    Years since quitting: 1.7  . Smokeless tobacco: Never Used  Substance and Sexual Activity  . Alcohol use: Yes    Alcohol/week: 14.0 standard drinks    Types: 14 Glasses of wine per week    Comment: weekends  . 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 Sexual activity: Not Currently    Birth control/protection: Surgical

## 2019-09-05 ENCOUNTER — Ambulatory Visit: Payer: Medicaid Other | Admitting: Family Medicine

## 2019-09-19 ENCOUNTER — Telehealth: Payer: Self-pay | Admitting: *Deleted

## 2019-09-19 NOTE — Telephone Encounter (Signed)
LVM to call office to go over screening questions prior to visit.Brick Ketcher Zimmerman Rumple, CMA  

## 2019-09-20 ENCOUNTER — Encounter: Payer: Self-pay | Admitting: Family Medicine

## 2019-09-20 ENCOUNTER — Other Ambulatory Visit: Payer: Self-pay

## 2019-09-20 ENCOUNTER — Other Ambulatory Visit (HOSPITAL_COMMUNITY)
Admission: RE | Admit: 2019-09-20 | Discharge: 2019-09-20 | Disposition: A | Discharge status: Home / Self Care | Payer: Medicaid Other | Source: Ambulatory Visit | Attending: Family Medicine | Admitting: Family Medicine

## 2019-09-20 ENCOUNTER — Ambulatory Visit (INDEPENDENT_AMBULATORY_CARE_PROVIDER_SITE_OTHER): Payer: Medicaid Other | Admitting: Family Medicine

## 2019-09-20 ENCOUNTER — Ambulatory Visit (HOSPITAL_COMMUNITY)
Admission: RE | Admit: 2019-09-20 | Discharge: 2019-09-20 | Disposition: A | Discharge status: Home / Self Care | Payer: Medicaid Other | Source: Ambulatory Visit | Attending: Family Medicine | Admitting: Family Medicine

## 2019-09-20 VITALS — BP 105/76 | HR 70 | Temp 98.0°F | Wt 93.0 lb

## 2019-09-20 DIAGNOSIS — Z01810 Encounter for preprocedural cardiovascular examination: Secondary | ICD-10-CM | POA: Diagnosis not present

## 2019-09-20 DIAGNOSIS — Z124 Encounter for screening for malignant neoplasm of cervix: Secondary | ICD-10-CM

## 2019-09-20 DIAGNOSIS — Z113 Encounter for screening for infections with a predominantly sexual mode of transmission: Secondary | ICD-10-CM | POA: Insufficient documentation

## 2019-09-20 DIAGNOSIS — M810 Age-related osteoporosis without current pathological fracture: Secondary | ICD-10-CM | POA: Diagnosis not present

## 2019-09-20 DIAGNOSIS — Z01818 Encounter for other preprocedural examination: Secondary | ICD-10-CM | POA: Diagnosis not present

## 2019-09-20 DIAGNOSIS — H5202 Hypermetropia, left eye: Secondary | ICD-10-CM | POA: Diagnosis not present

## 2019-09-20 MED ORDER — ALENDRONATE SODIUM 70 MG PO TABS: 70.0000 mg | ORAL_TABLET | ORAL | 11 refills | Status: DC

## 2019-09-20 NOTE — Assessment & Plan Note (Signed)
Assessment: Osteoporosis Plan: -We will initiate alendronate 70mg  weekly -Discussed with patient importance of taking this medication in the morning when she will be upright for least 30 minutes and not taking this medication with anything other than water for at least 30 minutes to maximize absorption.

## 2019-09-20 NOTE — Progress Notes (Signed)
   Subjective:    Patient ID: Annette Chang, female    DOB: [birth year], 49 y.o.   MRN: [id number]   CC: Pap smear and pre-op clearance for eye surgery.  HPI:  Patient is a very pleasant 49 year old presents today for Pap smear with STD testing as well as preop clearance for surgery. Patient states that she has had new sexual partner in the past 6 months and though she has no symptoms she would like to have STD testing during this Pap.  She denies vaginal discharge, vaginal pain, itching.  Osteoporosis: Patient with previous DEXA scan in 2019 which showed osteoporosis.  Patient was never started on bisphosphonate as the provider had difficulty getting in touch with the patient.  Patient states she would be very interested in starting this medication as she feels it is important to have good bone health.  Preop clearance for eye surgery: [name]  is here for a Pre-operative physical at the request of Dr. Frederico Hamman.   She is having eye surgery in March/April.  Personal or family hx of adverse outcome to anesthesia? No  Chipped, cracked, missing, or loose teeth? No  Decreased ROM of neck? Yes  Able to walk up 2 flights of stairs without becoming significantly short of breath or having chest pain? Yes   Revised Goldman Criteria: High Risk Surgery (intraperitoneal, intrathoracic, aortic): No  Ischemic heart disease (Prior MI, +excercise stress test, angina, nitrate use, Qwave): No  History of heart failure: No  History of cerebrovascular disease: No  History of diabetes: No  Insulin therapy for DM: No  Preoperative Cr >2.0: ordered  Revised Goldman Criteria - risk for major cardiac death No risk factors - 0.4 percent One risk factor - 1.0 percent  Two risk factors - 2.4 percent  Three or more risk factors - 5.4 percent    ROS: pertinent noted in the HPI   Pertinent PMH, PSH, FH, SoHx: Recently diagnosed history of osteoporosis  Smoking status - Current smoker  Objective:  BP  105/76   Pulse 70   Temp 98 F (36.7 C) (Oral)   Wt 93 lb (42.2 kg)   LMP 06/25/2019   SpO2 97%   BMI 17.57 kg/m   Vitals and nursing note reviewed  General: NAD, pleasant, able to participate in exam Cardiac: RRR, normal heart sounds, no murmurs. Respiratory: CTAB, normal effort, No wheezes, rales or rhonchi Extremities: no edema GU: Pelvic exam: VULVA: normal appearing vulva with no masses, tenderness or lesions, VAGINA: normal appearing vagina with normal color and discharge, no lesions, CERVIX: normal appearing cervix without discharge or lesions.   Assessment & Plan:    Osteoporosis Assessment: Osteoporosis Plan: -We will initiate alendronate 70mg  weekly -Discussed with patient importance of taking this medication in the morning when she will be upright for least 30 minutes and not taking this medication with anything other than water for at least 30 minutes to maximize absorption.  Preop clearance: -CBC with diff ordered. -EKG -CXAR  -Pap smear performed today with testing for GC/Ch   [name], [place] PGY-1

## 2019-09-20 NOTE — Patient Instructions (Addendum)
It was great to see you!  Our plans for today:  - We performed your pap smear today, I will call you with the results or send a letter if they are normal - We discussed your previous history of osteoporosis and are starting a medication called Alendronate. It is important to keep upright for at least 30 minutes after taking this medication and take it with a tall glass of water as it can cause throat irritation if it does not make it into your stomach. - It is also important to take this medication at least 30 minutes before eating or drinking anything other than water to make sure it is absorbed well. -We also are getting a chest x-ray, EKG, and some blood work for your preop clearance for eye surgery.   Take care and seek immediate care sooner if you develop any concerns.   Dr. [facility]     Osteoporosis  Osteoporosis is thinning and loss of density in your bones. Osteoporosis makes bones more brittle and fragile and more likely to break (fracture). Over time, osteoporosis can cause your bones to become so weak that they fracture after a minor fall. Bones in the hip, wrist, and spine are most likely to fracture due to osteoporosis. What are the causes? The exact cause of this condition is not known. What increases the risk? You may be at greater risk for osteoporosis if you:  Have a family history of the condition.  Have poor nutrition.  Use steroid medicines, such as prednisone.  Are female.  Are age 82 or older.  Smoke or have a history of smoking.  Are not physically active (are sedentary).  Are white (Caucasian) or of Asian descent.  Have a small body frame.  Take certain medicines, such as antiseizure medicines. What are the signs or symptoms? A fracture might be the first sign of osteoporosis, especially if the fracture results from a fall or injury that usually would not cause a bone to break. Other signs and symptoms include:  Pain in the  neck or low back.  Stooped posture.  Loss of height. How is this diagnosed? This condition may be diagnosed based on:  Your medical history.  A physical exam.  A bone mineral density test, also called a DXA or DEXA test (dual-energy X-ray absorptiometry test). This test uses X-rays to measure the amount of minerals in your bones. How is this treated? The goal of treatment is to strengthen your bones and lower your risk for a fracture. Treatment may involve:  Making lifestyle changes, such as: ? Including foods with more calcium and vitamin D in your diet. ? Doing weight-bearing and muscle-strengthening exercises. ? Stopping tobacco use. ? Limiting alcohol intake.  Taking medicine to slow the process of bone loss or to increase bone density.  Taking daily supplements of calcium and vitamin D.  Taking hormone replacement medicines, such as estrogen for women and testosterone for men.  Monitoring your levels of calcium and vitamin D. Follow these instructions at home:  Activity  Exercise as told by your health care provider. Ask your health care provider what exercises and activities are safe for you. You should do: ? Exercises that make you work against gravity (weight-bearing exercises), such as tai chi, yoga, or walking. ? Exercises to strengthen muscles, such as lifting weights. Lifestyle  Limit alcohol intake to no more than 1 drink a day for nonpregnant women and 2 drinks a day for men. One drink equals  12 oz of beer, 5 oz of wine, or 1 oz of hard liquor.  Do not use any products that contain nicotine or tobacco, such as cigarettes and e-cigarettes. If you need help quitting, ask your health care provider. Preventing falls  Use devices to help you move around (mobility aids) as needed, such as canes, walkers, scooters, or crutches.  Keep rooms well-lit and clutter-free.  Remove tripping hazards from walkways, including cords and throw rugs.  Install grab bars in  bathrooms and safety rails on stairs.  Use rubber mats in the bathroom and other areas that are often wet or slippery.  Wear closed-toe shoes that fit well and support your feet. Wear shoes that have rubber soles or low heels.  Review your medicines with your health care provider. Some medicines can cause dizziness or changes in blood pressure, which can increase your risk of falling. General instructions  Include calcium and vitamin D in your diet. Calcium is important for bone health, and vitamin D helps your body to absorb calcium. Good sources of calcium and vitamin D include: ? Certain fatty fish, such as salmon and tuna. ? Products that have calcium and vitamin D added to them (fortified products), such as fortified cereals. ? Egg yolks. ? Cheese. ? Liver.  Take over-the-counter and prescription medicines only as told by your health care provider.  Keep all follow-up visits as told by your health care provider. This is important. Contact a health care provider if:  You have never been screened for osteoporosis and you are: ? A woman who is age 1 or older. ? A man who is age 24 or older. Get help right away if:  You fall or injure yourself. Summary  Osteoporosis is thinning and loss of density in your bones. This makes bones more brittle and fragile and more likely to break (fracture),even with minor falls.  The goal of treatment is to strengthen your bones and reduce your risk for a fracture.  Include calcium and vitamin D in your diet. Calcium is important for bone health, and vitamin D helps your body to absorb calcium.  Talk with your health care provider about screening for osteoporosis if you are a woman who is age 54 or older, or a man who is age 74 or older. This information is not intended to replace advice given to you by your health care provider. Make sure you discuss any questions you have with your health care provider. Document Revised: 06/23/2017 Document  Reviewed: 05/05/2017 Elsevier Patient Education  2020 [employer].

## 2019-09-21 LAB — CBC WITH DIFFERENTIAL/PLATELET
Basophils Absolute: 0.1 10*3/uL (ref 0.0–0.2)
Basos: 1 %
EOS (ABSOLUTE): 0.1 10*3/uL (ref 0.0–0.4)
Eos: 2 %
Hematocrit: 39.1 % (ref 34.0–46.6)
Hemoglobin: 13.8 g/dL (ref 11.1–15.9)
Immature Grans (Abs): 0 10*3/uL (ref 0.0–0.1)
Immature Granulocytes: 0 %
Lymphocytes Absolute: 1.8 10*3/uL (ref 0.7–3.1)
Lymphs: 31 %
MCH: 31.4 pg (ref 26.6–33.0)
MCHC: 35.3 g/dL (ref 31.5–35.7)
MCV: 89 fL (ref 79–97)
Monocytes Absolute: 0.6 10*3/uL (ref 0.1–0.9)
Monocytes: 10 %
Neutrophils Absolute: 3.3 10*3/uL (ref 1.4–7.0)
Neutrophils: 56 %
Platelets: 205 10*3/uL (ref 150–450)
RBC: 4.4 x10E6/uL (ref 3.77–5.28)
RDW: 12.2 % (ref 11.7–15.4)
WBC: 5.8 10*3/uL (ref 3.4–10.8)

## 2019-09-23 LAB — CYTOLOGY - PAP
Chlamydia: NEGATIVE
Comment: NEGATIVE
Comment: NEGATIVE
Comment: NEGATIVE
Comment: NORMAL
Diagnosis: NEGATIVE
High risk HPV: NEGATIVE
Neisseria Gonorrhea: NEGATIVE
Trichomonas: POSITIVE — AB

## 2019-09-24 ENCOUNTER — Other Ambulatory Visit: Payer: Self-pay | Admitting: Family Medicine

## 2019-09-24 MED ORDER — METRONIDAZOLE 500 MG PO TABS: 500.0000 mg | ORAL_TABLET | Freq: Two times a day (BID) | ORAL | 0 refills | Status: DC

## 2019-09-24 NOTE — Progress Notes (Signed)
Called patient to discuss lab results and her positive trichomonas test. Talked with patient about the treatment course including 7 days of metronidazole and the fact that she should avoid alcohol during this time and this is a medication that should not be used if there is a chance she is pregnant. I also discussed with the patient that she should abstain from sexual intercourse until both her and her partner are treated. Patient expressed understanding that if she and are partner are both not fully treated they may end up becoming re-infected by each other. Patient informed that we can provide treatment for her partner or her partner can follow up with their PCP. Patient's questions answered.  Metronidazole 500mg  BID for 7 day course sent to patient's pharmacy.

## 2019-09-25 ENCOUNTER — Other Ambulatory Visit: Payer: Self-pay

## 2019-09-25 ENCOUNTER — Telehealth: Payer: Self-pay | Admitting: Orthopedic Surgery

## 2019-09-25 ENCOUNTER — Ambulatory Visit
Admission: RE | Admit: 2019-09-25 | Discharge: 2019-09-25 | Disposition: A | Discharge status: Home / Self Care | Payer: Medicaid Other | Source: Ambulatory Visit | Attending: Orthopedic Surgery | Admitting: Orthopedic Surgery

## 2019-09-25 ENCOUNTER — Other Ambulatory Visit: Payer: Medicaid Other

## 2019-09-25 DIAGNOSIS — M542 Cervicalgia: Secondary | ICD-10-CM | POA: Diagnosis not present

## 2019-09-25 DIAGNOSIS — M792 Neuralgia and neuritis, unspecified: Secondary | ICD-10-CM

## 2019-09-25 NOTE — Telephone Encounter (Signed)
Ok will see what scan shows

## 2019-09-25 NOTE — Telephone Encounter (Signed)
FYI

## 2019-09-25 NOTE — Telephone Encounter (Signed)
Patient called leaving message for Dr. Marlou Sa. Notifying Dr. Marlou Sa that left arm went out again before going to MRI. Patient states she is on her way to MRI. Patient name is [id number].

## 2019-09-30 ENCOUNTER — Telehealth: Payer: Self-pay | Admitting: Orthopedic Surgery

## 2019-09-30 ENCOUNTER — Other Ambulatory Visit: Payer: Self-pay | Admitting: Surgical

## 2019-09-30 MED ORDER — HYDROCODONE-ACETAMINOPHEN 5-325 MG PO TABS: 1.0000 | ORAL_TABLET | Freq: Every day | ORAL | 0 refills | Status: DC | PRN

## 2019-09-30 NOTE — Telephone Encounter (Signed)
Please advise. Thanks.  

## 2019-09-30 NOTE — Telephone Encounter (Signed)
submited

## 2019-09-30 NOTE — Telephone Encounter (Signed)
Patient called. She would like hydrocodone called in for her pain. Her call back number is [phone]

## 2019-10-01 NOTE — Telephone Encounter (Signed)
I called patient to advise. Left message with female to have patient return my call. If patient calls back, please let her know medication was sent to the pharmacy by [facility].

## 2019-10-01 NOTE — Telephone Encounter (Signed)
I spoke with patient and advised. 

## 2019-10-02 ENCOUNTER — Ambulatory Visit (INDEPENDENT_AMBULATORY_CARE_PROVIDER_SITE_OTHER): Payer: Medicaid Other | Admitting: Orthopedic Surgery

## 2019-10-02 ENCOUNTER — Other Ambulatory Visit: Payer: Self-pay

## 2019-10-02 DIAGNOSIS — R29898 Other symptoms and signs involving the musculoskeletal system: Secondary | ICD-10-CM | POA: Diagnosis not present

## 2019-10-03 ENCOUNTER — Encounter: Payer: Self-pay | Admitting: Orthopedic Surgery

## 2019-10-03 NOTE — Progress Notes (Signed)
Office Visit Note   Patient: Annette Chang           Date of Birth: Mar 29, 1971           MRN: [id number] Visit Date: 10/02/2019 Requested by: [name], DO [address],  [place] [id number] PCP: [name], DO  Subjective: Chief Complaint  Patient presents with  . Follow-up    HPI: Patient presents for evaluation of continued left arm symptoms.  Since have seen her she has had MRI scan of the cervical spine.  That scan shows no acute findings to explain the patient's left-sided symptoms.  She also has had a nerve study done about 7 months ago which was normal.  She states that she is dropping things with her left arm.  She has had 2 days of constant pain.  She has to hold both arms at times.  She has had 2 surgeries on the the right scapula for scapular winging for tendon transfer and they worked temporarily but her winging has recurred.  Hard for her to cook.  She is on medication for osteoporosis.  She denies any symptoms in her legs.  She reports neck pain.  She still is taking all her medications.  Cholesterols been a little bit higher.  Her weight has been constant and it is low.  She has been drinking Ensure.              ROS: All systems reviewed are negative as they relate to the chief complaint within the history of present illness.  Patient denies  fevers or chills.   Assessment & Plan: Visit Diagnoses:  1. Weakness of left arm     Plan: Impression is unexplained weakness and paresthesias in the left arm more than the right arm.  Cervical spine MRI normal.  Nerve study 7 months ago normal.  I think this may be some type of metabolic problem.  She is borderline anorexic appearing.  Weight is 93 pounds.  She may need lab work-up and systemic work-up for some of this numbness.  I do not think she has an orthopedic problem which can be treated to account for the weakness and numbness in the left arm and hand.  Follow-up with me as needed.  We will refer her to one  of the [facility] neurologist to evaluate this left arm weakness.  I think she may need some help with home health aide 2 days a week just to manage some of her ADLs and affairs.  Follow-Up Instructions: Return if symptoms worsen or fail to improve.   Orders:  Orders Placed This Encounter  Procedures  . Ambulatory referral to Neurology   No orders of the defined types were placed in this encounter.     Procedures: No procedures performed   Clinical Data: No additional findings.  Objective: Vital Signs: There were no vitals taken for this visit.  Physical Exam:   Constitutional: Patient appears well-developed HEENT:  Head: Normocephalic Eyes:EOM are somewhat abnormal Neck: Normal range of motion Cardiovascular: Normal rate Pulmonary/chest: Effort normal Neurologic: Patient is alert Skin: Skin is warm Psychiatric: Patient has normal mood and affect    Ortho Exam: Ortho exam demonstrates pretty reasonable cervical spine motion.  She does have scapular winging on the right compared to the left.  She has reasonable EPL FPL interosseous function negative Tinel's cubital tunnel in the elbow.  She has diminished passive range of motion on the right consistent with her longstanding scapular  winging.  On the left-hand side she has no restriction of passive external rotation and pretty reasonable rotator cuff strength.  No brachial plexus tenderness or axillary tenderness.  No masses lymphadenopathy or skin changes noted in that shoulder girdle region.  Specialty Comments:  No specialty comments available.  Imaging: No results found.   PMFS History: Patient Active Problem List   Diagnosis Date Noted  . Screening for STD (sexually transmitted disease) 09/20/2019  . Osteoporosis 12/08/2017  . 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 MDD (major depressive disorder) 03/29/2016  . Breast pain 01/18/2016  . 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 Chronic urticaria 08/06/2015  . Paresthesia  04/15/2015  . 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 Right shoulder pain 08/22/2014  . DUB (dysfunctional uterine bleeding) 03/11/2014  . H/O rotator cuff surgery 01/02/2012  . 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 Iron deficiency anemia 01/02/2012   Past Medical History:  Diagnosis Date  . Anxiety   . 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 Depression   . H/O rotator cuff surgery 01/02/2012  . H/O tubal ligation 01/02/2012  . H/O: C-section 01/02/2012   3   . H/O: C-section 01/02/2012   3   . Headache(784.0) 01/02/2012  . Iron deficiency anemia 01/02/2012  . 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 Diabetes Maternal Grandmother   . Diabetes Paternal Grandmother   . Diabetes Paternal Grandfather     Past Surgical History:  Procedure Laterality Date  . CESAREAN SECTION     3 previous c sections  . MUSCLE REPAIR Right 01/10/2017   RIGHT PECTORAL MAJOR TO SCAPULA MUSCLE TRANSFER (Right  . NOVASURE ABLATION N/A 03/11/2014   Procedure: NOVASURE ABLATION;  Surgeon: Emily Filbert, MD;  Location: [facility];  Service: Gynecology;  Laterality: N/A;  . PECTORALIS TENDON REPAIR Right 01/10/2017   Procedure: RIGHT PECTORAL MAJOR TO SCAPULA MUSCLE TRANSFER;  Surgeon: Meredith Pel, MD;  Location: [facility];  Service: Orthopedics;  Laterality: Right;  . PECTORALIS TENDON REPAIR Right 12/28/2017   Procedure: RIGHT REVISION TENDON TRANSFER OF PECTORALIS MAJOR;  Surgeon: Meredith Pel, MD;  Location: [facility];  Service: Orthopedics;  Laterality: Right;  . rotator cuff surgery    . SHOULDER SURGERY    . TUBAL LIGATION     Social History   Occupational History  . Occupation: unemployed  Tobacco Use  . Smoking status: Former Smoker    Years: 5.00    Types: Cigarettes    Quit date: 11/2017    Years since quitting: 1.8  . Smokeless tobacco: Never Used  Substance and Sexual Activity  . Alcohol use: Yes     Alcohol/week: 14.0 standard drinks    Types: 14 Glasses of wine per week    Comment: weekends  . 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 Sexual activity: Not Currently    Birth control/protection: Surgical

## 2019-10-10 ENCOUNTER — Telehealth: Payer: Self-pay | Admitting: Orthopedic Surgery

## 2019-10-10 NOTE — Telephone Encounter (Signed)
IC LMVM for patient advising she could pick up copy of last OV note at front desk.

## 2019-10-10 NOTE — Telephone Encounter (Signed)
Patient called wanting to know if the paper work for her to get a home health aid has been completed.  CB#[phone]

## 2019-10-14 ENCOUNTER — Telehealth: Payer: Self-pay | Admitting: *Deleted

## 2019-10-14 NOTE — Telephone Encounter (Signed)
Pt is scheduled at [facility] on Wed May 19 at 9:00am pt needs to arrive 15 mins early to [place] at the [facility].Pt is scheduled with Dr. Roanna Raider. I have called pt and left vm

## 2019-10-18 ENCOUNTER — Other Ambulatory Visit: Payer: Medicaid Other

## 2019-10-21 ENCOUNTER — Other Ambulatory Visit: Payer: Self-pay

## 2019-10-21 ENCOUNTER — Other Ambulatory Visit: Payer: Medicaid Other

## 2019-10-30 ENCOUNTER — Telehealth: Payer: Self-pay | Admitting: Orthopedic Surgery

## 2019-10-30 NOTE — Telephone Encounter (Signed)
Patient called requested refill of Hydrocodone & Robaxin  Please call patient to advise.  [phone]

## 2019-10-30 NOTE — Telephone Encounter (Signed)
Pls advise. Thanks.  

## 2019-10-30 NOTE — Telephone Encounter (Signed)
Okay to refill thanks

## 2019-10-30 NOTE — Telephone Encounter (Signed)
Can you please send this in for patient?

## 2019-11-01 ENCOUNTER — Other Ambulatory Visit: Payer: Self-pay | Admitting: Surgical

## 2019-11-01 MED ORDER — HYDROCODONE-ACETAMINOPHEN 5-325 MG PO TABS: 1.0000 | ORAL_TABLET | Freq: Every day | ORAL | 0 refills | Status: DC | PRN

## 2019-11-01 MED ORDER — METHOCARBAMOL 500 MG PO TABS: 500.0000 mg | ORAL_TABLET | Freq: Two times a day (BID) | ORAL | 0 refills | Status: DC

## 2019-11-11 ENCOUNTER — Telehealth: Payer: Self-pay | Admitting: Orthopedic Surgery

## 2019-11-11 NOTE — Telephone Encounter (Signed)
IC LMVM advising could pick up at front door.

## 2019-11-11 NOTE — Telephone Encounter (Signed)
Patient called asked if she can come in today to pick up a sling. Patient asked for a call back as soon as possible. The number to contact patient is [phone]

## 2019-11-11 NOTE — Telephone Encounter (Signed)
Patient called again.   Refer to last message. She is in need of a sling.

## 2019-11-11 NOTE — Telephone Encounter (Signed)
See other note

## 2019-11-14 ENCOUNTER — Other Ambulatory Visit: Payer: Self-pay

## 2019-11-14 DIAGNOSIS — M810 Age-related osteoporosis without current pathological fracture: Secondary | ICD-10-CM

## 2019-11-14 MED ORDER — ALENDRONATE SODIUM 70 MG PO TABS: 70.0000 mg | ORAL_TABLET | ORAL | 11 refills | Status: DC

## 2019-11-15 ENCOUNTER — Other Ambulatory Visit: Payer: Self-pay

## 2019-11-15 ENCOUNTER — Telehealth: Payer: Self-pay | Admitting: *Deleted

## 2019-11-15 ENCOUNTER — Other Ambulatory Visit (HOSPITAL_COMMUNITY)
Admission: RE | Admit: 2019-11-15 | Discharge: 2019-11-15 | Disposition: A | Discharge status: Home / Self Care | Payer: Medicaid Other | Source: Ambulatory Visit | Attending: Family Medicine | Admitting: Family Medicine

## 2019-11-15 ENCOUNTER — Ambulatory Visit (INDEPENDENT_AMBULATORY_CARE_PROVIDER_SITE_OTHER): Payer: Medicaid Other | Admitting: Family Medicine

## 2019-11-15 ENCOUNTER — Ambulatory Visit: Payer: Medicaid Other | Admitting: Family Medicine

## 2019-11-15 VITALS — BP 104/70 | HR 82 | Wt 91.0 lb

## 2019-11-15 DIAGNOSIS — N898 Other specified noninflammatory disorders of vagina: Secondary | ICD-10-CM | POA: Insufficient documentation

## 2019-11-15 LAB — POCT WET PREP (WET MOUNT)
Clue Cells Wet Prep Whiff POC: NEGATIVE
Trichomonas Wet Prep HPF POC: ABSENT

## 2019-11-15 MED ORDER — FLUCONAZOLE 150 MG PO TABS: 150.0000 mg | ORAL_TABLET | Freq: Once | ORAL | 0 refills | Status: AC

## 2019-11-15 NOTE — Assessment & Plan Note (Signed)
Patient with 1 week history of vaginal itch after changing her shower gel exactly 1 week ago.  Wet prep today showing positive yeast and KOH. -Treat with Diflucan 150 mg x 1 -Follow-up as needed

## 2019-11-15 NOTE — Progress Notes (Signed)
    SUBJECTIVE:   CHIEF COMPLAINT / HPI:   Concerns for Vaginal discharge/itch: Since 1 week patient has been experiencing minimal vaginal discharge as well as vaginal itch.  Otherwise denies any concerning signs or symptoms, but desires STI check.  Denies any foul odor.  No nausea, vomiting, abdominal pain, rashes, joint swelling.  PERTINENT  PMH / PSH:  Patient Active Problem List   Diagnosis Date Noted  . Vaginal discharge 11/15/2019  . Screening for STD (sexually transmitted disease) 09/20/2019  . Osteoporosis 12/08/2017  . 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 MDD (major depressive disorder) 03/29/2016  . Breast pain 01/18/2016  . 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 Chronic urticaria 08/06/2015  . Paresthesia 04/15/2015  . 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 Right shoulder pain 08/22/2014  . DUB (dysfunctional uterine bleeding) 03/11/2014  . H/O rotator cuff surgery 01/02/2012  . 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 Iron deficiency anemia 01/02/2012     OBJECTIVE:   BP 104/70   Pulse 82   Wt 91 lb (41.3 kg)   SpO2 97%   BMI 17.19 kg/m    Physical exam: General: Well-appearing, no apparent distress Respiratory: Comfortable work of breathing Cardio: RRR, S1-S2 present, no murmurs appreciated GU: Normal-appearing labia majora and minora, thin white discharge with scant "chunky" appearance appreciated on vaginal exam, no vaginal erythema, cervix normal in appearance without any concerning signs  ASSESSMENT/PLAN:   Vaginal discharge Patient with 1 week history of vaginal itch after changing her shower gel exactly 1 week ago.  Wet prep today showing positive yeast and KOH. -Treat with Diflucan 150 mg x 1 -Follow-up as needed   UPDATE: I attempted to call the patient with the numbers provided in her chart to inform her of her test results, but was unable to reach her (1 phone line out of service, other 1 had no answer).  Diflucan has still been sent to  patient's pharmacy.  [place]

## 2019-11-15 NOTE — Progress Notes (Deleted)
    SUBJECTIVE:   CHIEF COMPLAINT / HPI:   VAGINAL DISCHARGE - positive for trich 08/2019  Having vaginal discharge for *** days. Discharge consistency: *** Discharge color: *** Medications tried: ***  Recent antibiotic use: *** Sex in last month: *** Possible STD exposure:***  Symptoms Fever: *** Dysuria:*** Vaginal bleeding: *** Abdomen or Pelvic pain: *** Back pain: *** Genital sores or ulcers:*** Rash: *** Pain during sex: *** Missed menstrual period: ***   PERTINENT  PMH / PSH: Chronic urticaria, osteoporosis, DU B, IDA, depression, nerve palsy  OBJECTIVE:   There were no vitals taken for this visit.  ***  ASSESSMENT/PLAN:   No problem-specific Assessment & Plan notes found for this encounter.     Rory Percy, [place]

## 2019-11-15 NOTE — Telephone Encounter (Signed)
LM for patient to call back.  Per Dr. Ouida Sills, patient has a yeast infection and diflucan has been called in for treatment.  This is the second attempt to call patient but there was no answer.  Will forward to MD to try and reach over the weekend.  Kellon Chalk,CMA

## 2019-11-18 LAB — CERVICOVAGINAL ANCILLARY ONLY
Chlamydia: NEGATIVE
Comment: NEGATIVE
Comment: NORMAL
Neisseria Gonorrhea: NEGATIVE

## 2019-11-19 NOTE — Telephone Encounter (Signed)
Attempted to call patient and see if she picked up her medication but line was busy.  Layanna Charo,CMA

## 2019-12-02 ENCOUNTER — Telehealth: Payer: Self-pay | Admitting: Orthopedic Surgery

## 2019-12-02 ENCOUNTER — Other Ambulatory Visit: Payer: Self-pay | Admitting: Surgical

## 2019-12-02 MED ORDER — METHOCARBAMOL 500 MG PO TABS: 500.0000 mg | ORAL_TABLET | Freq: Two times a day (BID) | ORAL | 0 refills | Status: DC

## 2019-12-02 MED ORDER — HYDROCODONE-ACETAMINOPHEN 5-325 MG PO TABS: 1.0000 | ORAL_TABLET | Freq: Every day | ORAL | 0 refills | Status: DC | PRN

## 2019-12-02 NOTE — Telephone Encounter (Signed)
Patient called requesting medication refills. Patient needs her hydrocodone and muscle relaxer methocarbamol. Please send to pharmacy on file. Patient phone number is [phone].

## 2019-12-02 NOTE — Telephone Encounter (Signed)
Pls advise. Thanks.  

## 2019-12-02 NOTE — Telephone Encounter (Signed)
Tried calling patient to advise submitted and per Runell Gess note, no answer, no VM to LM

## 2019-12-02 NOTE — Telephone Encounter (Signed)
Can we refer her to pain management after this RX?

## 2019-12-11 DIAGNOSIS — R29898 Other symptoms and signs involving the musculoskeletal system: Secondary | ICD-10-CM | POA: Diagnosis not present

## 2019-12-11 DIAGNOSIS — M79622 Pain in left upper arm: Secondary | ICD-10-CM | POA: Diagnosis not present

## 2019-12-30 ENCOUNTER — Telehealth: Payer: Self-pay | Admitting: Orthopedic Surgery

## 2019-12-30 NOTE — Telephone Encounter (Signed)
Pls advise.  

## 2019-12-30 NOTE — Telephone Encounter (Signed)
Patient called.   She needs a refill on her hydrocodone and muscle relaxer.   Call back: [phone]

## 2019-12-30 NOTE — Telephone Encounter (Signed)
Can we refer to pain management?

## 2019-12-31 NOTE — Telephone Encounter (Signed)
yes

## 2019-12-31 NOTE — Telephone Encounter (Signed)
Pls advise. Thanks.  

## 2020-01-01 ENCOUNTER — Other Ambulatory Visit: Payer: Self-pay | Admitting: Surgical

## 2020-01-01 ENCOUNTER — Telehealth: Payer: Self-pay | Admitting: Orthopedic Surgery

## 2020-01-01 MED ORDER — METHOCARBAMOL 500 MG PO TABS: 500.0000 mg | ORAL_TABLET | Freq: Two times a day (BID) | ORAL | 0 refills | Status: DC

## 2020-01-01 MED ORDER — HYDROCODONE-ACETAMINOPHEN 5-325 MG PO TABS: 1.0000 | ORAL_TABLET | Freq: Every day | ORAL | 0 refills | Status: DC | PRN

## 2020-01-01 NOTE — Telephone Encounter (Signed)
y

## 2020-01-01 NOTE — Telephone Encounter (Signed)
Pt called stating the pharmacy still hasn't received her rx and she just wants to check on the status.   [phone]

## 2020-01-01 NOTE — Telephone Encounter (Signed)
See other note. Waiting for [facility] to send in medication.

## 2020-01-01 NOTE — Telephone Encounter (Signed)
See below. Can you submit then send back to me and I will put in referral for pain management

## 2020-01-01 NOTE — Telephone Encounter (Signed)
Do we want to refill until she gets into pain management?

## 2020-01-02 NOTE — Telephone Encounter (Signed)
I advised patient rx submitted. She said she has already initiated care with a provider at [facility].

## 2020-01-16 NOTE — Progress Notes (Deleted)
    SUBJECTIVE:   CHIEF COMPLAINT / HPI:   Ms. Annette Chang is a very pleasant 49 year old female the presents today for Pap smear and to discuss abdominal pain.  Abdominal pain  Timeframe/onset: *** Severity (1-10):*** Location: *** Duration of pain: *** Pain radiation: *** Character of pain: *** Pain improves with: *** Pain worse with: *** Interventions/medications attempted/results:***  ***  PERTINENT  PMH / PSH: ***  OBJECTIVE:   There were no vitals taken for this visit.   General: NAD, pleasant, able to participate in exam Cardiac: RRR, no murmurs. Respiratory: CTAB, normal effort, No wheezes, rales or rhonchi Abdomen: Bowel sounds present, nontender, nondistended, no hepatosplenomegaly. Extremities: no edema or cyanosis. Skin: warm and dry, no rashes noted Neuro: alert, no obvious focal deficits Psych: Normal affect and mood  ASSESSMENT/PLAN:   No problem-specific Assessment & Plan notes found for this encounter.     Annette Chang, [place]

## 2020-01-17 ENCOUNTER — Ambulatory Visit: Payer: Medicaid Other | Admitting: Family Medicine

## 2020-01-29 ENCOUNTER — Ambulatory Visit (INDEPENDENT_AMBULATORY_CARE_PROVIDER_SITE_OTHER): Payer: Medicaid Other | Admitting: Orthopedic Surgery

## 2020-01-29 DIAGNOSIS — R202 Paresthesia of skin: Secondary | ICD-10-CM | POA: Diagnosis not present

## 2020-01-29 DIAGNOSIS — R29898 Other symptoms and signs involving the musculoskeletal system: Secondary | ICD-10-CM

## 2020-01-29 DIAGNOSIS — R200 Anesthesia of skin: Secondary | ICD-10-CM | POA: Diagnosis not present

## 2020-01-29 DIAGNOSIS — M25511 Pain in right shoulder: Secondary | ICD-10-CM

## 2020-01-29 DIAGNOSIS — M958 Other specified acquired deformities of musculoskeletal system: Secondary | ICD-10-CM | POA: Diagnosis not present

## 2020-01-31 ENCOUNTER — Other Ambulatory Visit: Payer: Self-pay | Admitting: Surgical

## 2020-01-31 ENCOUNTER — Telehealth: Payer: Self-pay | Admitting: Orthopedic Surgery

## 2020-01-31 MED ORDER — HYDROCODONE-ACETAMINOPHEN 5-325 MG PO TABS: 1.0000 | ORAL_TABLET | Freq: Every day | ORAL | 0 refills | Status: DC | PRN

## 2020-01-31 MED ORDER — METHOCARBAMOL 500 MG PO TABS: 500.0000 mg | ORAL_TABLET | Freq: Two times a day (BID) | ORAL | 0 refills | Status: DC

## 2020-01-31 NOTE — Telephone Encounter (Signed)
I advised pt

## 2020-01-31 NOTE — Telephone Encounter (Signed)
Patient called requesting a refill on following medications. Hydrocodone and methocarbamol. Please send to pharmacy on file. Patient phone number is [phone].

## 2020-01-31 NOTE — Telephone Encounter (Signed)
Pls advise. Thanks.  

## 2020-01-31 NOTE — Telephone Encounter (Signed)
Submitted RX. She is being re-referred to pain management

## 2020-02-01 ENCOUNTER — Encounter: Payer: Self-pay | Admitting: Orthopedic Surgery

## 2020-02-01 NOTE — Progress Notes (Signed)
Office Visit Note   Patient: Annette Chang           Date of Birth: [birth year]           MRN: [id number] Visit Date: 01/29/2020 Requested by: [name], DO [place],  [place] [address] PCP: [name], DO  Subjective: Chief Complaint  Patient presents with  . Right Shoulder - Pain    HPI: Patient presents for evaluation of right shoulder.  She not really had much change.  She does use a sling for pain.  She has had 2 attempts at tendon transfer for scapular winging which have worked for about 3 to 4 months each but then the deformity recurs.  She has another issue going on in the left shoulder.  She went to [facility] for neurology appointment to try to get EMG nerve study set up for that arm and to be evaluated.  There is some type of hold up on the return visit for that appointment.  She does report bilateral hand numbness and tingling.  She does have issues with low body mass index.              ROS: All systems reviewed are negative as they relate to the chief complaint within the history of present illness.  Patient denies  fevers or chills.   Assessment & Plan: Visit Diagnoses:  1. Weakness of left arm   2. Numbness and tingling in left arm   3. Winged scapula of right side   4. Right shoulder pain, unspecified chronicity     Plan: Impression is stable right shoulder which is not too functional in terms of overhead activity due to unstable scapular platform.  I will think there is too much more to be done for that right shoulder.  Scapulothoracic fusion could be considered but that would be something to be done at a tertiary center.  I think she does need some assistance at home and that is prescribed today.  We may need to consider pain management for this patient if she continues to require narcotics for activities of daily living.  Follow-Up Instructions: Return if symptoms worsen or fail to improve.   Orders:  Orders Placed This Encounter  Procedures   . Ambulatory referral to Pain Clinic   No orders of the defined types were placed in this encounter.     Procedures: No procedures performed   Clinical Data: No additional findings.  Objective: Vital Signs: There were no vitals taken for this visit.  Physical Exam:   Constitutional: Patient appears to have diminished BMI HEENT:  Head: Normocephalic Eyes:EOM are slightly AB normal Neck: Normal range of motion Cardiovascular: Normal rate Pulmonary/chest: Effort normal Neurologic: Patient is alert Skin: Skin is warm Psychiatric: Patient has normal mood and affect    Ortho Exam: Ortho exam demonstrates scapular winging on the right but not the left.  Motor sensory function of the hand is intact.  Neck range of motion intact.  Both hands are perfused.  The rest of the Ortho exam is unchanged from prior visit.  Specialty Comments:  No specialty comments available.  Imaging: No results found.   PMFS History: Patient Active Problem List   Diagnosis Date Noted  . Vaginal discharge 11/15/2019  . Screening for STD (sexually transmitted disease) 09/20/2019  . Osteoporosis 12/08/2017  . 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 MDD (major depressive disorder) 03/29/2016  . Breast pain 01/18/2016  . 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  Chronic urticaria 08/06/2015  . Paresthesia 04/15/2015  . 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 Right shoulder pain 08/22/2014  . DUB (dysfunctional uterine bleeding) 03/11/2014  . H/O rotator cuff surgery 01/02/2012  . 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 Iron deficiency anemia 01/02/2012   Past Medical History:  Diagnosis Date  . Anxiety   . 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 Depression   . H/O rotator cuff surgery 01/02/2012  . H/O tubal ligation 01/02/2012  . H/O: C-section 01/02/2012   3   . H/O: C-section 01/02/2012   3   . Headache(784.0) 01/02/2012  . Iron deficiency anemia 01/02/2012  . Long thoracic  nerve lesion 11/12/2015   Right    Family History  Problem Relation Age of Onset  . Diabetes Maternal Grandmother   . Diabetes Paternal Grandmother   . Diabetes Paternal Grandfather     Past Surgical History:  Procedure Laterality Date  . CESAREAN SECTION     3 previous c sections  . MUSCLE REPAIR Right 01/10/2017   RIGHT PECTORAL MAJOR TO SCAPULA MUSCLE TRANSFER (Right  . NOVASURE ABLATION N/A 03/11/2014   Procedure: NOVASURE ABLATION;  Surgeon: Emily Filbert, MD;  Location: [facility];  Service: Gynecology;  Laterality: N/A;  . PECTORALIS TENDON REPAIR Right 01/10/2017   Procedure: RIGHT PECTORAL MAJOR TO SCAPULA MUSCLE TRANSFER;  Surgeon: Meredith Pel, MD;  Location: [facility];  Service: Orthopedics;  Laterality: Right;  . PECTORALIS TENDON REPAIR Right 12/28/2017   Procedure: RIGHT REVISION TENDON TRANSFER OF PECTORALIS MAJOR;  Surgeon: Meredith Pel, MD;  Location: [facility];  Service: Orthopedics;  Laterality: Right;  . rotator cuff surgery    . SHOULDER SURGERY    . TUBAL LIGATION     Social History   Occupational History  . Occupation: unemployed  Tobacco Use  . Smoking status: Former Smoker    Years: 5.00    Types: Cigarettes    Quit date: 11/2017    Years since quitting: 2.1  . Smokeless tobacco: Never Used  Vaping Use  . Vaping Use: Never used  Substance and Sexual Activity  . Alcohol use: Yes    Alcohol/week: 14.0 standard drinks    Types: 14 Glasses of wine per week    Comment: weekends  . 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 Sexual activity: Not Currently    Birth control/protection: Surgical

## 2020-02-19 ENCOUNTER — Other Ambulatory Visit: Payer: Self-pay | Admitting: Family Medicine

## 2020-02-24 ENCOUNTER — Ambulatory Visit: Payer: Medicaid Other | Admitting: Family Medicine

## 2020-02-25 ENCOUNTER — Telehealth: Payer: Self-pay | Admitting: Orthopedic Surgery

## 2020-02-25 NOTE — Telephone Encounter (Signed)
Patient called.   She is requesting a refill on her hydrocodone. She is also wanting to know what's going on with her referral to pain management.   Call back: [phone]

## 2020-02-26 ENCOUNTER — Other Ambulatory Visit: Payer: Self-pay | Admitting: Surgical

## 2020-02-26 MED ORDER — HYDROCODONE-ACETAMINOPHEN 5-325 MG PO TABS: 1.0000 | ORAL_TABLET | Freq: Every day | ORAL | 0 refills | Status: DC | PRN

## 2020-02-26 NOTE — Telephone Encounter (Signed)
Submitted RX but she def needs pain management referral.  Thanks!

## 2020-02-26 NOTE — Telephone Encounter (Signed)
Pls advise on rx. I will look at the referral.

## 2020-02-27 NOTE — Telephone Encounter (Signed)
Referral information submitted to [facility] patient and advised.

## 2020-03-03 ENCOUNTER — Telehealth: Payer: Self-pay | Admitting: Orthopedic Surgery

## 2020-03-03 NOTE — Telephone Encounter (Signed)
IC initiated [place] Ref# [id number] They will fax notification to our office within 24-48hrs If we have not received anything call back to [phone]

## 2020-03-03 NOTE — Telephone Encounter (Signed)
Pt called stating her rx still hasn't been authorized and she's unable to pick it up. Pt would like a CB when it's been authorized  [phone]

## 2020-03-09 ENCOUNTER — Telehealth: Payer: Self-pay | Admitting: Orthopedic Surgery

## 2020-03-09 NOTE — Telephone Encounter (Signed)
Patient called.   She is requesting we give her pharmacy a call to settle an ongoing issue she is having receiving her medication. Said the issue has been going on since last week and she is without medication.   Call back: [phone]

## 2020-03-09 NOTE — Telephone Encounter (Signed)
Denied by [facility] Advised patient of this.

## 2020-03-09 NOTE — Telephone Encounter (Signed)
IC s/w patient and advised per notification received [facility] today that they denied rx for Norco.

## 2020-03-16 ENCOUNTER — Other Ambulatory Visit: Payer: Self-pay | Admitting: Family Medicine

## 2020-03-16 ENCOUNTER — Encounter: Payer: Self-pay | Admitting: Family Medicine

## 2020-03-16 ENCOUNTER — Ambulatory Visit (INDEPENDENT_AMBULATORY_CARE_PROVIDER_SITE_OTHER): Payer: Medicaid Other | Admitting: Family Medicine

## 2020-03-16 ENCOUNTER — Other Ambulatory Visit: Payer: Self-pay

## 2020-03-16 VITALS — BP 132/78 | HR 54 | Wt 92.0 lb

## 2020-03-16 DIAGNOSIS — N644 Mastodynia: Secondary | ICD-10-CM

## 2020-03-16 DIAGNOSIS — Z1211 Encounter for screening for malignant neoplasm of colon: Secondary | ICD-10-CM

## 2020-03-16 DIAGNOSIS — G479 Sleep disorder, unspecified: Secondary | ICD-10-CM | POA: Diagnosis not present

## 2020-03-16 DIAGNOSIS — Z0000 Encounter for general adult medical examination without abnormal findings: Secondary | ICD-10-CM | POA: Insufficient documentation

## 2020-03-16 MED ORDER — IBUPROFEN 600 MG PO TABS
600.0000 mg | ORAL_TABLET | Freq: Three times a day (TID) | ORAL | 0 refills | Status: DC
Start: 2020-03-16 — End: 2020-06-10

## 2020-03-16 NOTE — Assessment & Plan Note (Signed)
Ongoing for a while.  Patient reports that Zoloft as it was not helping.  She has been seen to program called calm which is helped her get a good nights rest. -Discussed good sleep hygiene -Continue listening to program if helps. -Advised not to stop Zoloft as it should be tapered.  Patient understands. -Follow-up with PCP to discuss antidepressants.

## 2020-03-16 NOTE — Assessment & Plan Note (Signed)
Refer for colonoscopy Hepatitis C labs deferred until next visit per patient. Patient wants to wait until mammogram is completed to get her Covid vaccines.

## 2020-03-16 NOTE — Progress Notes (Signed)
    SUBJECTIVE:   CHIEF COMPLAINT / HPI: Vital signs checked and bilateral breast pain.  Bilateral breast pain Patient reports intermittent sharp bilateral breast pain ongoing for at least 1 year.  Denies any fevers, nipple discharge, lumps or masses felt. Reports history of dense breasts and was told to have mammograms twice yearly.  Last mammogram 12/20 within normal limits and was recommended to have yearly follow-up.  She takes ibuprofen 3 times a week and reports some relief.  Not sleeping Reports she is having difficulties sleeping.  She started listening to program [facility] calm for the past week which she reports has helped her with her sleep issues.  She stopped her Zoloft last week and reports that she feels better.  Reports she was taking 50 mg and decreased to 25 mg as needed.  Denies any suicidal or homicidal ideation.  PERTINENT  PMH / PSH:  Bilateral breast pain   OBJECTIVE:   BP 132/78   Pulse (!) 54   Wt 92 lb (41.7 kg)   SpO2 97%   BMI 17.38 kg/m   General: Alert and oriented, no apparent distress  Breast exam: No nipple discharge, erythema or edema noted.  No skin changes, lumps or masses palpated.  Bilateral breast exam normal Psych: Behavior and speech appropriate to situation  ASSESSMENT/PLAN:   Breast pain She can Major depressive disorder she is got winging scapula she has got arthritis recently mentally bilateral breast pain ongoing since last year.  Benign breast exam.  Last mammogram 12/12 and recommend repeat mammogram yearly.  History right ring scapula although this does not appear to be musculoskeletal at this time given no reproducible pain. -Will order diagnostic mammogram -Ibuprofen 600 mg 3 times daily x14/7 -Follow-up PCP as needed.  Healthcare maintenance Refer for colonoscopy Hepatitis C labs deferred until next visit per patient. Patient wants to wait until mammogram is completed to get her Covid vaccines.  Sleeping difficulty Ongoing for a  while.  Patient reports that Zoloft as it was not helping.  She has been seen to program called calm which is helped her get a good nights rest. -Discussed good sleep hygiene -Continue listening to program if helps. -Advised not to stop Zoloft as it should be tapered.  Patient understands. -Follow-up with PCP to discuss antidepressants.     [name], MD [place]

## 2020-03-16 NOTE — Patient Instructions (Addendum)
It was nice meeting you today!  Referral sent for Colonoscopy  Mammogram ordered Take Ibuprofen for discomfort three times a day for 2 weeks.  If pain no better please let PCP and make an appointment to discuss  If you have any questions or concerns, please feel free to call the clinic.   Be well, [name], MD [facility]     Chest Wall Pain Chest wall pain is pain in or around the bones and muscles of your chest. Sometimes, an injury causes this pain. Excessive coughing or overuse of arm and chest muscles may also cause chest wall pain. Sometimes, the cause may not be known. This pain may take several weeks or longer to get better. Follow these instructions at home: Managing pain, stiffness, and swelling   If directed, put ice on the painful area: ? Put ice in a plastic bag. ? Place a towel between your skin and the bag. ? Leave the ice on for 20 minutes, 2-3 times per day. Activity  Rest as told by your health care provider.  Avoid activities that cause pain. These include any activities that use your chest muscles or your abdominal and side muscles to lift heavy items. Ask your health care provider what activities are safe for you. General instructions   Take over-the-counter and prescription medicines only as told by your health care provider.  Do not use any products that contain nicotine or tobacco, such as cigarettes, e-cigarettes, and chewing tobacco. These can delay healing after injury. If you need help quitting, ask your health care provider.  Keep all follow-up visits as told by your health care provider. This is important. Contact a health care provider if:  You have a fever.  Your chest pain becomes worse.  You have new symptoms. Get help right away if:  You have nausea or vomiting.  You feel sweaty or light-headed.  You have a cough with mucus from your lungs (sputum) or you cough up blood.  You develop shortness of breath. These  symptoms may represent a serious problem that is an emergency. Do not wait to see if the symptoms will go away. Get medical help right away. Call your local emergency services (911 in the [place] Do not drive yourself to the hospital. Summary  Chest wall pain is pain in or around the bones and muscles of your chest.  Depending on the cause, it may be treated with ice, rest, medicines, and avoiding activities that cause pain.  Contact a health care provider if you have a fever, worsening chest pain, or new symptoms.  Get help right away if you feel light-headed or you develop shortness of breath. These symptoms may be an emergency. This information is not intended to replace advice given to you by your health care provider. Make sure you discuss any questions you have with your health care provider. Document Revised: 01/11/2018 Document Reviewed: 01/11/2018 Elsevier Patient Education  2020 [employer].

## 2020-03-16 NOTE — Assessment & Plan Note (Addendum)
She can Major depressive disorder she is got winging scapula she has got arthritis recently mentally bilateral breast pain ongoing since last year.  Benign breast exam.  Last mammogram 12/12 and recommend repeat mammogram yearly.  History right ring scapula although this does not appear to be musculoskeletal at this time given no reproducible pain. -Will order diagnostic mammogram -Ibuprofen 600 mg 3 times daily x14/7 -Follow-up PCP as needed.

## 2020-03-20 ENCOUNTER — Encounter: Payer: Self-pay | Admitting: Gastroenterology

## 2020-04-06 ENCOUNTER — Telehealth: Payer: Self-pay

## 2020-04-06 ENCOUNTER — Telehealth: Payer: Self-pay | Admitting: Orthopedic Surgery

## 2020-04-06 NOTE — Telephone Encounter (Signed)
Patient called she never got a call back about pain management she would like a call back regarding information. Call [phone]

## 2020-04-06 NOTE — Telephone Encounter (Signed)
Patient called. She would like a RX for hydrocodone. Her call back number is [phone]

## 2020-04-06 NOTE — Telephone Encounter (Signed)
Please advise. Thanks.  

## 2020-04-07 NOTE — Telephone Encounter (Signed)
Called pt and informed her referral was refaxed to [facility] and someone from that office will be giving her a call within the next couple weeks and if she doenst hear anything in 2 weeks to give me a call back and will check on it.

## 2020-04-07 NOTE — Telephone Encounter (Signed)
Did we figure out the status of the pain management referral?

## 2020-04-07 NOTE — Telephone Encounter (Signed)
Appt still pending with [facility].

## 2020-04-10 ENCOUNTER — Other Ambulatory Visit: Payer: Self-pay | Admitting: Surgical

## 2020-04-10 MED ORDER — HYDROCODONE-ACETAMINOPHEN 5-325 MG PO TABS: 1.0000 | ORAL_TABLET | Freq: Every day | ORAL | 0 refills | Status: DC | PRN

## 2020-04-10 NOTE — Telephone Encounter (Signed)
Will refill Norco as she is waiting to hear back.  Please remind her to call back if she doesn't hear from pain management in the next week or so.  Thanks

## 2020-04-10 NOTE — Telephone Encounter (Signed)
Patient aware.

## 2020-04-16 ENCOUNTER — Ambulatory Visit (INDEPENDENT_AMBULATORY_CARE_PROVIDER_SITE_OTHER): Payer: Medicaid Other | Admitting: Orthopedic Surgery

## 2020-04-16 ENCOUNTER — Encounter: Payer: Self-pay | Admitting: Orthopedic Surgery

## 2020-04-16 DIAGNOSIS — R29898 Other symptoms and signs involving the musculoskeletal system: Secondary | ICD-10-CM

## 2020-04-16 NOTE — Progress Notes (Signed)
Office Visit Note   Patient: Annette Chang           Date of Birth: Mar 19, 1971           MRN: [id number] Visit Date: 04/16/2020 Requested by: [name], DO [address] [place],  [place] [address] PCP: [name], DO  Subjective: Chief Complaint  Patient presents with  . bilat arm/shoulder pain    right worse than left    HPI: Reveals a 49 year old patient with bilateral arm and shoulder pain.  She is concerned that her right arm is hanging.  She has had 2 surgeries to try to correct chronic scapular winging.  She had temporary relief but then the deformity recurred.  Reports generally constant pain but she is decided to stop taking the hydrocodone.  Pain management referral still pending with [facility].  She is taking ibuprofen muscle relaxer and Zoloft.  She has had gabapentin in the past but felt like it was too strong.              ROS: All systems reviewed are negative as they relate to the chief complaint within the history of present illness.  Patient denies  fevers or chills.   Assessment & Plan: Visit Diagnoses:  1. Weakness of left arm     Plan: Impression is right arm scapular winging with no real definite operative procedure that could give her predictable relief outside of scapulothoracic fusion but I do not think that her nutritional status is such that I would encourage that at this time.  I do want to try tramadol 1 a day to maximum of 2 a day.  New sling also provided.  Follow-up as needed.  Follow-Up Instructions: Return if symptoms worsen or fail to improve.   Orders:  No orders of the defined types were placed in this encounter.  No orders of the defined types were placed in this encounter.     Procedures: No procedures performed   Clinical Data: No additional findings.  Objective: Vital Signs: There were no vitals taken for this visit.  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  I am not seeing too much of that on the left-hand side.  Below shoulder level external rotation symmetric with good rotator cuff strength bilaterally.  Not too much crepitus with passive range of motion on the left.  Cervical spine range of motion full.  Motor sensory function to the hands intact.  Specialty Comments:  No specialty comments available.  Imaging: No results found.   PMFS History: Patient Active Problem List   Diagnosis Date Noted  . Healthcare maintenance 03/16/2020  . Sleeping difficulty 03/16/2020  . Vaginal discharge 11/15/2019  . Screening for STD (sexually transmitted disease) 09/20/2019  . Osteoporosis 12/08/2017  . 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 MDD (major depressive disorder) 03/29/2016  . Breast pain 01/18/2016  . 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 Chronic urticaria 08/06/2015  . Paresthesia 04/15/2015  . 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 Right shoulder pain 08/22/2014  . DUB (dysfunctional uterine bleeding) 03/11/2014  . H/O rotator cuff surgery 01/02/2012  . Arthritis of shoulder region, right  01/02/2012  . Iron deficiency anemia 01/02/2012   Past Medical History:  Diagnosis Date  . Anxiety   . 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 Depression   . H/O rotator cuff surgery 01/02/2012  . H/O tubal ligation 01/02/2012  . H/O: C-section 01/02/2012   3   . H/O: C-section 01/02/2012   3   . Headache(784.0) 01/02/2012  . Iron deficiency anemia 01/02/2012  . 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 Diabetes  Maternal Grandmother   . Diabetes Paternal Grandmother   . Diabetes Paternal Grandfather     Past Surgical History:  Procedure Laterality Date  . CESAREAN SECTION     3 previous c sections  . MUSCLE REPAIR Right 01/10/2017   RIGHT PECTORAL MAJOR TO SCAPULA MUSCLE TRANSFER (Right  . NOVASURE ABLATION N/A 03/11/2014   Procedure: NOVASURE ABLATION;  Surgeon: Emily Filbert, MD;  Location: [facility];  Service: Gynecology;  Laterality: N/A;  . PECTORALIS TENDON REPAIR Right 01/10/2017   Procedure: RIGHT PECTORAL MAJOR TO SCAPULA MUSCLE TRANSFER;  Surgeon: Meredith Pel, MD;  Location: [facility];  Service: Orthopedics;  Laterality: Right;  . PECTORALIS TENDON REPAIR Right 12/28/2017   Procedure: RIGHT REVISION TENDON TRANSFER OF PECTORALIS MAJOR;  Surgeon: Meredith Pel, MD;  Location: [facility];  Service: Orthopedics;  Laterality: Right;  . rotator cuff surgery    . SHOULDER SURGERY    . TUBAL LIGATION     Social History   Occupational History  . Occupation: unemployed  Tobacco Use  . Smoking status: Former Smoker    Years: 5.00    Types: Cigarettes    Quit date: 11/2017    Years since quitting: 2.4  . Smokeless tobacco: Never Used  Vaping Use  . Vaping Use: Never used  Substance and Sexual Activity  . Alcohol use: Yes    Alcohol/week: 14.0 standard drinks    Types: 14 Glasses of wine per week    Comment: weekends  . 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 Sexual activity: Not Currently    Birth control/protection: Surgical

## 2020-04-23 ENCOUNTER — Telehealth: Payer: Self-pay

## 2020-04-23 NOTE — Telephone Encounter (Signed)
See his last note. Looks like she is referring to ultram. Can you please submit?

## 2020-04-23 NOTE — Telephone Encounter (Signed)
Patient called stating Dr.[name] prescribed her to a new medication (she doesn't remember the name) she is waiting for prescription to be sent to the pharmacy. Call back:[phone]

## 2020-04-24 ENCOUNTER — Other Ambulatory Visit: Payer: Self-pay

## 2020-04-24 ENCOUNTER — Ambulatory Visit (AMBULATORY_SURGERY_CENTER): Payer: Self-pay | Admitting: *Deleted

## 2020-04-24 ENCOUNTER — Other Ambulatory Visit: Payer: Self-pay | Admitting: Surgical

## 2020-04-24 VITALS — Ht 61.0 in | Wt 91.0 lb

## 2020-04-24 DIAGNOSIS — Z01818 Encounter for other preprocedural examination: Secondary | ICD-10-CM

## 2020-04-24 DIAGNOSIS — Z1211 Encounter for screening for malignant neoplasm of colon: Secondary | ICD-10-CM

## 2020-04-24 MED ORDER — SUPREP BOWEL PREP KIT 17.5-3.13-1.6 GM/177ML PO SOLN: 1.0000 | Freq: Once | ORAL | 0 refills | Status: AC

## 2020-04-24 MED ORDER — TRAMADOL HCL 50 MG PO TABS: 50.0000 mg | ORAL_TABLET | Freq: Every day | ORAL | 0 refills | Status: DC | PRN

## 2020-04-24 NOTE — Progress Notes (Signed)
covid test 05-13-20 at 900 am- paperwork clarified and pt knows to go at this time  Pt is aware that care partner will wait in the car during procedure; if they feel like they will be too hot or cold to wait in the car; they may wait in the 4 th floor lobby. Patient is aware to bring only one care partner. We want them to wear a mask (we do not have any that we can provide them), practice social distancing, and we will check their temperatures when they get here.  I did remind the patient that their care partner needs to stay in the parking lot the entire time and have a cell phone available, we will call them when the pt is ready for discharge. Patient will wear mask into building.   No trouble with anesthesia, difficulty with intubation or hx/fam hx of malignant hyperthermia per pt   No egg or soy allergy  No home oxygen use   No medications for weight loss taken  emmi information given  Pt denies constipation issues

## 2020-04-24 NOTE — Telephone Encounter (Signed)
Referral was faxed to [facility].  Do you have contact number to follow up on this referral?  I only have Amanda's fax.

## 2020-04-24 NOTE — Telephone Encounter (Signed)
Can you tell what the status of her pain mgmt is?

## 2020-04-24 NOTE — Telephone Encounter (Signed)
Sent in Tramadol RX. Whats the status of her pain management referral?

## 2020-04-27 NOTE — Telephone Encounter (Signed)
Reggy Eye with [facility] to check status

## 2020-04-27 NOTE — Telephone Encounter (Signed)
You can try [phone]

## 2020-04-29 ENCOUNTER — Telehealth: Payer: Self-pay | Admitting: Radiology

## 2020-04-29 NOTE — Telephone Encounter (Signed)
tyvm

## 2020-04-29 NOTE — Telephone Encounter (Signed)
Sending to you both as Juluis Rainier

## 2020-04-29 NOTE — Telephone Encounter (Signed)
Received fax from [place] with [facility] in reference to status check of patients pain referral from previous message. They have attempted to contact patient with no responses.  02/26/20 - 1. LVM    2. LVM with family member  02/27/20 - 3.  LVM   4.  Answered and hung up

## 2020-05-13 ENCOUNTER — Other Ambulatory Visit: Payer: Self-pay | Admitting: Gastroenterology

## 2020-05-13 DIAGNOSIS — Z1159 Encounter for screening for other viral diseases: Secondary | ICD-10-CM | POA: Diagnosis not present

## 2020-05-13 LAB — SARS CORONAVIRUS 2 (TAT 6-24 HRS): SARS Coronavirus 2: NEGATIVE

## 2020-05-15 ENCOUNTER — Other Ambulatory Visit: Payer: Self-pay

## 2020-05-15 ENCOUNTER — Ambulatory Visit (AMBULATORY_SURGERY_CENTER): Payer: Medicaid Other | Admitting: Gastroenterology

## 2020-05-15 ENCOUNTER — Encounter: Payer: Self-pay | Admitting: Gastroenterology

## 2020-05-15 VITALS — BP 111/72 | HR 54 | Temp 97.5°F | Resp 13 | Ht 61.0 in | Wt 91.0 lb

## 2020-05-15 DIAGNOSIS — D128 Benign neoplasm of rectum: Secondary | ICD-10-CM

## 2020-05-15 DIAGNOSIS — K621 Rectal polyp: Secondary | ICD-10-CM

## 2020-05-15 DIAGNOSIS — Z1211 Encounter for screening for malignant neoplasm of colon: Secondary | ICD-10-CM | POA: Diagnosis not present

## 2020-05-15 HISTORY — PX: COLONOSCOPY: SHX174

## 2020-05-15 MED ORDER — SODIUM CHLORIDE 0.9 % IV SOLN: 500.0000 mL | Freq: Once | INTRAVENOUS | Status: DC

## 2020-05-15 NOTE — Progress Notes (Signed)
CW vitals and AG IV.  Pt's states no medical or surgical changes since previsit or office visit.

## 2020-05-15 NOTE — Patient Instructions (Signed)
Polyps (handout given) Diverticulosis (handout given) Hemorrhoids (handout given) High Fiber Diet (handout given)  Repeat colonoscopy dependent upon pathology.  YOU HAD AN ENDOSCOPIC PROCEDURE TODAY AT [facility]:   Refer to the procedure report that was given to you for any specific questions about what was found during the examination.  If the procedure report does not answer your questions, please call your gastroenterologist to clarify.  If you requested that your care partner not be given the details of your procedure findings, then the procedure report has been included in a sealed envelope for you to review at your convenience later.  YOU SHOULD EXPECT: Some feelings of bloating in the abdomen. Passage of more gas than usual.  Walking can help get rid of the air that was put into your GI tract during the procedure and reduce the bloating. If you had a lower endoscopy (such as a colonoscopy or flexible sigmoidoscopy) you may notice spotting of blood in your stool or on the toilet paper. If you underwent a bowel prep for your procedure, you may not have a normal bowel movement for a few days.  Please Note:  You might notice some irritation and congestion in your nose or some drainage.  This is from the oxygen used during your procedure.  There is no need for concern and it should clear up in a day or so.  SYMPTOMS TO REPORT IMMEDIATELY:   Following lower endoscopy (colonoscopy or flexible sigmoidoscopy):  Excessive amounts of blood in the stool  Significant tenderness or worsening of abdominal pains  Swelling of the abdomen that is new, acute  Fever of 100F or higher   For urgent or emergent issues, a gastroenterologist can be reached at any hour by calling [phone]. Do not use MyChart messaging for urgent concerns.    DIET:  We do recommend a small meal at first, but then you may proceed to your regular diet.  Drink plenty of fluids but you should avoid  alcoholic beverages for 24 hours.  ACTIVITY:  You should plan to take it easy for the rest of today and you should NOT DRIVE or use heavy machinery until tomorrow (because of the sedation medicines used during the test).    FOLLOW UP: Our staff will call the number listed on your records 48-72 hours following your procedure to check on you and address any questions or concerns that you may have regarding the information given to you following your procedure. If we do not reach you, we will leave a message.  We will attempt to reach you two times.  During this call, we will ask if you have developed any symptoms of COVID 19. If you develop any symptoms (ie: fever, flu-like symptoms, shortness of breath, cough etc.) before then, please call [phone].  If you test positive for Covid 19 in the 2 weeks post procedure, please call and report this information to [place].    If any biopsies were taken you will be contacted by phone or by letter within the next 1-3 weeks.  Please call us at [phone] if you have not heard about the biopsies in 3 weeks.    SIGNATURES/CONFIDENTIALITY: You and/or your care partner have signed paperwork which will be entered into your electronic medical record.  These signatures attest to the fact that that the information above on your After Visit Summary has been reviewed and is understood.  Full responsibility of the confidentiality of this discharge information lies with you  and/or your care-partner. 

## 2020-05-15 NOTE — Progress Notes (Signed)
Called to room to assist during endoscopic procedure.  Patient ID and intended procedure confirmed with present staff. Received instructions for my participation in the procedure from the performing physician.  

## 2020-05-15 NOTE — Op Note (Signed)
[facility] Patient Name: Annette Chang Procedure Date: 05/15/2020 7:50 AM MRN: [id number] Endoscopist: Justice Britain , MD Age: 49 Referring MD:  Date of Birth: 05-23-1971 Gender: Female Account #: [id number] Procedure:                Colonoscopy Indications:              Screening for colorectal malignant neoplasm Medicines:                Monitored Anesthesia Care Procedure:                Pre-Anesthesia Assessment:                           - Prior to the procedure, a History and Physical                            was performed, and patient medications and                            allergies were reviewed. The patient's tolerance of                            previous anesthesia was also reviewed. The risks                            and benefits of the procedure and the sedation                            options and risks were discussed with the patient.                            All questions were answered, and informed consent                            was obtained. Prior Anticoagulants: The patient has                            taken no previous anticoagulant or antiplatelet                            agents except for aspirin. ASA Grade Assessment: II                            - A patient with mild systemic disease. After                            reviewing the risks and benefits, the patient was                            deemed in satisfactory condition to undergo the                            procedure.  After obtaining informed consent, the colonoscope                            was passed under direct vision. Throughout the                            procedure, the patient's blood pressure, pulse, and                            oxygen saturations were monitored continuously. The                            Colonoscope was introduced through the anus and                            advanced to the the cecum, identified by                             appendiceal orifice and ileocecal valve. The                            colonoscopy was performed without difficulty. The                            patient tolerated the procedure. The quality of the                            bowel preparation was adequate. The ileocecal                            valve, appendiceal orifice, and rectum were                            photographed. Scope In: 8:09:43 AM Scope Out: 8:29:12 AM Scope Withdrawal Time: 0 hours 15 minutes 47 seconds  Total Procedure Duration: 0 hours 19 minutes 29 seconds  Findings:                 The digital rectal exam findings include                            hemorrhoids. Pertinent negatives include no                            palpable rectal lesions.                           Two sessile polyps were found in the rectum. The                            polyps were 2 to 3 mm in size. These polyps were                            removed with a cold snare. Resection and retrieval  were complete.                           Multiple small-mouthed diverticula were found in                            the recto-sigmoid colon and sigmoid colon.                           Normal mucosa was found in the entire colon                            otherwise.                           Non-bleeding non-thrombosed external and internal                            hemorrhoids were found during retroflexion, during                            perianal exam and during digital exam. The                            hemorrhoids were Grade II (internal hemorrhoids                            that prolapse but reduce spontaneously). Complications:            No immediate complications. Estimated Blood Loss:     Estimated blood loss was minimal. Impression:               - Hemorrhoids found on digital rectal exam.                           - Two 2 to 3 mm polyps in the rectum, removed with                             a cold snare. Resected and retrieved.                           - Diverticulosis in the recto-sigmoid colon and in                            the sigmoid colon.                           - Normal mucosa in the entire examined colon                            otherwise.                           - Non-bleeding non-thrombosed external and internal                            hemorrhoids. Recommendation:           -  The patient will be observed post-procedure,                            until all discharge criteria are met.                           - Discharge patient to home.                           - Patient has a contact number available for                            emergencies. The signs and symptoms of potential                            delayed complications were discussed with the                            patient. Return to normal activities tomorrow.                            Written discharge instructions were provided to the                            patient.                           - High fiber diet.                           - Use FiberCon 1-2 tablets PO daily.                           - Continue present medications.                           - Await pathology results.                           - Repeat colonoscopy in 11/28/08 years for                            surveillance based on pathology results.                           - The findings and recommendations were discussed                            with the patient. Justice Britain, MD 05/15/2020 8:37:55 AM

## 2020-05-15 NOTE — Progress Notes (Signed)
Report given to PACU, vss 

## 2020-05-19 ENCOUNTER — Telehealth: Payer: Self-pay

## 2020-05-19 NOTE — Telephone Encounter (Signed)
  Follow up Call-  Call back number 05/15/2020  Post procedure Call Back phone  # [phone]  Permission to leave phone message Yes  Some recent data might be hidden     Patient questions:  Do you have a fever, pain , or abdominal swelling? No. Pain Score  0 *  Have you tolerated food without any problems? Yes.    Have you been able to return to your normal activities? Yes.    Do you have any questions about your discharge instructions: Diet   No. Medications  No. Follow up visit  No.  Do you have questions or concerns about your Care? No.  Actions: * If pain score is 4 or above: No action needed, pain <4.   1. Have you developed a fever since your procedure? no  2.   Have you had an respiratory symptoms (SOB or cough) since your procedure? no  3.   Have you tested positive for COVID 19 since your procedure no  4.   Have you had any family members/close contacts diagnosed with the COVID 19 since your procedure?  no   If yes to any of these questions please route to Joylene John, RN and Joella Prince, RN

## 2020-05-22 ENCOUNTER — Encounter: Payer: Self-pay | Admitting: Gastroenterology

## 2020-06-09 ENCOUNTER — Other Ambulatory Visit: Payer: Self-pay | Admitting: Family Medicine

## 2020-06-10 ENCOUNTER — Ambulatory Visit (INDEPENDENT_AMBULATORY_CARE_PROVIDER_SITE_OTHER): Payer: Medicaid Other | Admitting: Family Medicine

## 2020-06-10 ENCOUNTER — Ambulatory Visit: Payer: Medicaid Other | Admitting: Orthopedic Surgery

## 2020-06-10 ENCOUNTER — Other Ambulatory Visit: Payer: Self-pay

## 2020-06-10 ENCOUNTER — Encounter: Payer: Self-pay | Admitting: Family Medicine

## 2020-06-10 VITALS — BP 102/60 | HR 66 | Ht 61.0 in | Wt 91.8 lb

## 2020-06-10 DIAGNOSIS — Z0000 Encounter for general adult medical examination without abnormal findings: Secondary | ICD-10-CM | POA: Diagnosis not present

## 2020-06-10 DIAGNOSIS — Z1159 Encounter for screening for other viral diseases: Secondary | ICD-10-CM

## 2020-06-10 DIAGNOSIS — Z419 Encounter for procedure for purposes other than remedying health state, unspecified: Secondary | ICD-10-CM | POA: Diagnosis not present

## 2020-06-10 DIAGNOSIS — D509 Iron deficiency anemia, unspecified: Secondary | ICD-10-CM

## 2020-06-10 DIAGNOSIS — N644 Mastodynia: Secondary | ICD-10-CM

## 2020-06-10 DIAGNOSIS — Z791 Long term (current) use of non-steroidal anti-inflammatories (NSAID): Secondary | ICD-10-CM | POA: Diagnosis not present

## 2020-06-10 DIAGNOSIS — Z01818 Encounter for other preprocedural examination: Secondary | ICD-10-CM | POA: Insufficient documentation

## 2020-06-10 MED ORDER — IBUPROFEN 600 MG PO TABS: 600.0000 mg | ORAL_TABLET | Freq: Three times a day (TID) | ORAL | 0 refills | Status: DC

## 2020-06-10 NOTE — Assessment & Plan Note (Signed)
Patient reports that she has had a long history of breast pain see HPI.  She also has a history of a long thoracic nerve disorder, paresthesias, winged scapula, etc.  Pain is bilateral.  No family history of breast cancer.  Last mammogram was in 2012.  Attempted to get mammogram recently, unable to complete due to arm pain.  Will contact mammogram center for recommendations.  Perhaps patient can do ultrasound for screening?  Patient may use ibuprofen but discussed importance of not overusing ibuprofen, not using it for no more than 5 days at a time before stopping to give holiday.  Will check BMP to ensure patient has not overused medication to cause AKI. - Will call Breast center to inquire next steps for breast ca screening - Ibuprofen 600 mg TID PRN with caution to use intermittently - BMP to check scr

## 2020-06-10 NOTE — Assessment & Plan Note (Signed)
She states she has no longer taking iron supplements.  She is now in the perimenopausal stage, her period has been more erratic comes less frequently and she has been having hot flashes.  Will check CBC to assess iron deficiency anemia as well as per request by patient for upcoming eye surgery. - CBC

## 2020-06-10 NOTE — Assessment & Plan Note (Signed)
Colonoscopy with 2 polyps removed, return in 10 years. COVID-19 2nd shot in 1 week. Hepatitis C screening performed today

## 2020-06-10 NOTE — Patient Instructions (Addendum)
It was a pleasure to see you today!  1. We will get blood work today to check your kidney function and your blood count. I will let you know results next week by letter if normal, and by phone if abnormal.   2. For breast cancer screening: I will get in touch with the breast center for best recommendations on how to conduct breast cancer screening if mammogram truly is not possible. I will update you in a week.  3. For your eye surgery: you can get a chest x-ray done at [facility] (see hand out) anytime between 8AM-5PM. I will see the results and let you know in a week. For an EKG, please call to schedule an appointment.  Be Well,  Dr. Chauncey Reading

## 2020-06-10 NOTE — Progress Notes (Signed)
SUBJECTIVE:   CHIEF COMPLAINT / HPI: question regarding colonoscopy results  Colonoscopy performed on 05/15/20 which found 2 polyps removed with cold snare, some diverticula in colon, otherwise normal mucosa, mild internal hemorrhoids on retroflexion. Pathology report on 05/20/20 identified the polyps as hyperplastic, but without adenomatous change or carcinoma. Dr. Rush Landmark recommends a high fiber diet and repeat colonoscopy in 10 years.  Patient planning to get eye surgery done with [facility], requests EKG, CBC, and CXR. Will obtain these per patient preference. Patient prefers to have EKG done another time.  Breast pain: Patient reports very long history of breast pain that is bilateral, sharp in nature, intermittently present.  She says that ibuprofen helps relieve the pain, she does not have to use it every day, but would like a refill of ibuprofen 600 mg.  She was previously sent for a screening mammogram, however due to her complex right shoulder and arm problems, she was told "we cannot do a mammogram on you or else it will be a lawsuit for [place]."  Patient was adamant that she tried to get a mammogram and they would not complete it due to her arm problems.  She reports that is why she had a colonoscopy instead, however risk of breast cancer cannot be assessed via colonoscopy.  Patient denies any breast cancer history in her family.  She has not noticed any changes in her skin or lymph nodes.  She reports that this pain she has had has been present over many years and the only relief she has for it is intermittent use of NSAIDs.  PERTINENT  PMH / PSH: breast pain  OBJECTIVE:   BP 102/60   Pulse 66   Ht 5\' 1"  (1.549 m)   Wt 91 lb 12.8 oz (41.6 kg)   SpO2 98%   BMI 17.35 kg/m   HEENT: Sclera without injection or icterus. MMM.  Cardiac: Regular rate and rhythm. Normal S1/S2. No murmurs, rubs, or gallops appreciated. Lungs: Clear bilaterally to ascultation.  Breast: Symmetric  breasts with no palpable breast masses or obvious breast lesions. She has no retractions or nipple discharge. She has no axillary abnormalities or palpable masses. Ext: no peripheral edema Psych: Pleasant and appropriate   ASSESSMENT/PLAN:   Iron deficiency anemia She states she has no longer taking iron supplements.  She is now in the perimenopausal stage, her period has been more erratic comes less frequently and she has been having hot flashes.  Will check CBC to assess iron deficiency anemia as well as per request by patient for upcoming eye surgery. - CBC  Breast pain Patient reports that she has had a long history of breast pain see HPI.  She also has a history of a long thoracic nerve disorder, paresthesias, winged scapula, etc.  Pain is bilateral.  No family history of breast cancer.  Last mammogram was in 2012.  Attempted to get mammogram recently, unable to complete due to arm pain.  Will contact mammogram center for recommendations.  Perhaps patient can do ultrasound for screening?  Patient may use ibuprofen but discussed importance of not overusing ibuprofen, not using it for no more than 5 days at a time before stopping to give holiday.  Will check BMP to ensure patient has not overused medication to cause AKI. - Will call Breast center to inquire next steps for breast ca screening - Ibuprofen 600 mg TID PRN with caution to use intermittently - BMP to check scr  Preoperative testing Patient having  low risk ocular surgery. Patient specifically requests EKG, CXR, CBC for clearance. Although EKG and CXR are not necessary (no h/o lung or cardiac disease) (CBC appropriate for h/o iron deficiency), because patient requests per ophthalmology, will do for elective surgery.  - CBC - CXR - EKG- patient prefers to return to do this another time     Gladys Damme, [place]

## 2020-06-10 NOTE — Assessment & Plan Note (Signed)
Patient having low risk ocular surgery. Patient specifically requests EKG, CXR, CBC for clearance. Although EKG and CXR are not necessary (no h/o lung or cardiac disease) (CBC appropriate for h/o iron deficiency), because patient requests per ophthalmology, will do for elective surgery.  - CBC - CXR - EKG- patient prefers to return to do this another time

## 2020-06-11 LAB — HEPATITIS C ANTIBODY: Hep C Virus Ab: <0.1 s/co ratio (ref 0.0–0.9)

## 2020-06-12 ENCOUNTER — Encounter: Payer: Self-pay | Admitting: Family Medicine

## 2020-06-12 LAB — CBC WITH DIFFERENTIAL/PLATELET
Basophils Absolute: 0.1 10*3/uL (ref 0.0–0.2)
Basos: 1 %
EOS (ABSOLUTE): 0.1 10*3/uL (ref 0.0–0.4)
Eos: 2 %
Hematocrit: 40.3 % (ref 34.0–46.6)
Hemoglobin: 13.8 g/dL (ref 11.1–15.9)
Immature Grans (Abs): 0 10*3/uL (ref 0.0–0.1)
Immature Granulocytes: 0 %
Lymphocytes Absolute: 1.7 10*3/uL (ref 0.7–3.1)
Lymphs: 28 %
MCH: 30.9 pg (ref 26.6–33.0)
MCHC: 34.2 g/dL (ref 31.5–35.7)
MCV: 90 fL (ref 79–97)
Monocytes Absolute: 0.5 10*3/uL (ref 0.1–0.9)
Monocytes: 8 %
Neutrophils Absolute: 3.6 10*3/uL (ref 1.4–7.0)
Neutrophils: 61 %
Platelets: 192 10*3/uL (ref 150–450)
RBC: 4.47 x10E6/uL (ref 3.77–5.28)
RDW: 12.3 % (ref 11.7–15.4)
WBC: 6 10*3/uL (ref 3.4–10.8)

## 2020-06-12 LAB — BASIC METABOLIC PANEL
BUN/Creatinine Ratio: 21 (ref 9–23)
BUN: 12 mg/dL (ref 6–24)
CO2: 19 mmol/L — ABNORMAL LOW (ref 20–29)
Calcium: 10.1 mg/dL (ref 8.7–10.2)
Chloride: 99 mmol/L (ref 96–106)
Creatinine, Ser: 0.57 mg/dL (ref 0.57–1.00)
GFR calc Af Amer: 125 mL/min/{1.73_m2} (ref >59)
GFR calc non Af Amer: 108 mL/min/{1.73_m2} (ref >59)
Glucose: 84 mg/dL (ref 65–99)
Potassium: 4.1 mmol/L (ref 3.5–5.2)
Sodium: 138 mmol/L (ref 134–144)

## 2020-06-24 ENCOUNTER — Other Ambulatory Visit: Payer: Self-pay

## 2020-06-24 ENCOUNTER — Ambulatory Visit (INDEPENDENT_AMBULATORY_CARE_PROVIDER_SITE_OTHER): Payer: Medicaid Other | Admitting: Orthopedic Surgery

## 2020-06-24 ENCOUNTER — Encounter: Payer: Self-pay | Admitting: Orthopedic Surgery

## 2020-06-24 DIAGNOSIS — M25511 Pain in right shoulder: Secondary | ICD-10-CM

## 2020-06-24 MED ORDER — HYDROCODONE-ACETAMINOPHEN 5-325 MG PO TABS
1.0000 | ORAL_TABLET | Freq: Two times a day (BID) | ORAL | 0 refills | Status: DC | PRN
Start: 2020-06-24 — End: 2021-06-15

## 2020-06-24 NOTE — Progress Notes (Signed)
Office Visit Note   Patient: Annette Chang           Date of Birth: 09/14/1970           MRN: [id number] Visit Date: 06/24/2020 Requested by: [name], DO [place],  [place] [address] PCP: [name], DO  Subjective: Chief Complaint  Patient presents with  . Right Shoulder - Pain  . Left Shoulder - Pain    HPI: Reveals a 49 year old patient with bilateral shoulder pain.  She has had multiple surgeries on the right shoulder and that has been a problem.  Left shoulder is somewhat functional.  Reports continued pain.  Cannot get the medication that was prescribed.  States that her fingers are folding up some she has not had any word from [facility] pain management.  I think she really has enough function in her upper extremities to have any kind of meaningful employment at this time.  She also has an abscess that she needs to get treated as well as eye surgery all of which is happening this week.  She is having some vision issues which is affecting her gait..              ROS: All systems reviewed are negative as they relate to the chief complaint within the history of present illness.  Patient denies  fevers or chills.   Assessment & Plan: Visit Diagnoses:  1. Right shoulder pain, unspecified chronicity     Plan: Impression is diminished function right shoulder more than left shoulder.  Patient still has an element of malnutrition present as well by observation.  And she is having a lot of social issues as well as in terms of getting bills paid.  She definitely is a difficult situation.  I do not think she can use her arms for meaningful work at this time particularly the right arm.  Pain medicine refilled.  Follow-up as needed.  Follow-Up Instructions: No follow-ups on file.   Orders:  No orders of the defined types were placed in this encounter.  No orders of the defined types were placed in this encounter.     Procedures: No procedures  performed   Clinical Data: No additional findings.  Objective: Vital Signs: There were no vitals taken for this visit.  Physical Exam:   Constitutional: Patient appears well-developed HEENT:  Head: Normocephalic Eyes:EOM are abnormal Neck: Normal range of motion Cardiovascular: Normal rate Pulmonary/chest: Effort normal Neurologic: Patient is alert Skin: Skin is warm Psychiatric: Patient has normal mood and affect    Ortho Exam: Ortho exam demonstrates diminished active and passive range of motion of the right shoulder consistent with her known diagnosis of long thoracic serratus anterior nerve palsy.  Motor or sensory function of the hand is intact.  Radial pulses intact.  Neck range of motion is reasonable.  Scapular winging is present on the right and to a lesser degree on the left. Specialty Comments:  No specialty comments available.  Imaging: No results found.   PMFS History: Patient Active Problem List   Diagnosis Date Noted  . Preoperative testing 06/10/2020  . Healthcare maintenance 03/16/2020  . Sleeping difficulty 03/16/2020  . Vaginal discharge 11/15/2019  . Screening for STD (sexually transmitted disease) 09/20/2019  . Osteoporosis 12/08/2017  . 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 MDD (major depressive disorder) 03/29/2016  . Breast pain 01/18/2016  . 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 Chronic urticaria 08/06/2015  . Paresthesia 04/15/2015  . 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 Right shoulder pain 08/22/2014  . DUB (dysfunctional uterine bleeding) 03/11/2014  . H/O rotator cuff surgery 01/02/2012  . 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 Iron deficiency anemia 01/02/2012   Past Medical History:  Diagnosis Date  . Anxiety   . 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 Blood transfusion without reported diagnosis   . Depression   . H/O rotator cuff surgery 01/02/2012  . H/O tubal ligation 01/02/2012  . H/O: C-section 01/02/2012    3   . H/O: C-section 01/02/2012   3   . Headache(784.0) 01/02/2012  . Iron deficiency anemia 01/02/2012  . 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 Diabetes Maternal Grandmother   . Diabetes Paternal Grandmother   . Diabetes Paternal Grandfather   . Colon cancer Neg Hx   . Esophageal cancer Neg Hx   . Rectal cancer Neg Hx   . Stomach cancer Neg Hx     Past Surgical History:  Procedure Laterality Date  . CESAREAN SECTION     3 previous c sections  . MUSCLE REPAIR Right 01/10/2017   RIGHT PECTORAL MAJOR TO SCAPULA MUSCLE TRANSFER (Right  . NOVASURE ABLATION N/A 03/11/2014   Procedure: NOVASURE ABLATION;  Surgeon: Emily Filbert, MD;  Location: [facility];  Service: Gynecology;  Laterality: N/A;  . PECTORALIS TENDON REPAIR Right 01/10/2017   Procedure: RIGHT PECTORAL MAJOR TO SCAPULA MUSCLE TRANSFER;  Surgeon: Meredith Pel, MD;  Location: [facility];  Service: Orthopedics;  Laterality: Right;  . PECTORALIS TENDON REPAIR Right 12/28/2017   Procedure: RIGHT REVISION TENDON TRANSFER OF PECTORALIS MAJOR;  Surgeon: Meredith Pel, MD;  Location: [facility];  Service: Orthopedics;  Laterality: Right;  . rotator cuff surgery    . SHOULDER SURGERY    . TUBAL LIGATION     Social History   Occupational History  . Occupation: unemployed  Tobacco Use  . Smoking status: Former Smoker    Years: 5.00    Types: Cigarettes    Quit date: 11/2017    Years since quitting: 2.5  . Smokeless tobacco: Never Used  Vaping Use  . Vaping Use: Never used  Substance and Sexual Activity  . Alcohol use: Yes    Alcohol/week: 14.0 standard drinks    Types: 14 Glasses of wine per week    Comment: weekends  . Drug use: Yes    Comment: marijuana used 3 times a weekly  . Sexual activity: Not Currently    Birth control/protection: Surgical

## 2020-06-26 ENCOUNTER — Ambulatory Visit (HOSPITAL_COMMUNITY)
Admission: RE | Admit: 2020-06-26 | Discharge: 2020-06-26 | Disposition: A | Discharge status: Home / Self Care | Payer: Medicaid Other | Source: Ambulatory Visit | Attending: Family Medicine | Admitting: Family Medicine

## 2020-06-26 ENCOUNTER — Other Ambulatory Visit: Payer: Self-pay

## 2020-06-26 ENCOUNTER — Encounter: Payer: Self-pay | Admitting: Family Medicine

## 2020-06-26 ENCOUNTER — Ambulatory Visit (INDEPENDENT_AMBULATORY_CARE_PROVIDER_SITE_OTHER): Payer: Medicaid Other | Admitting: Family Medicine

## 2020-06-26 VITALS — BP 100/68 | HR 65 | Wt 91.6 lb

## 2020-06-26 DIAGNOSIS — Z01818 Encounter for other preprocedural examination: Secondary | ICD-10-CM

## 2020-06-26 DIAGNOSIS — Z23 Encounter for immunization: Secondary | ICD-10-CM | POA: Diagnosis not present

## 2020-06-26 DIAGNOSIS — F17200 Nicotine dependence, unspecified, uncomplicated: Secondary | ICD-10-CM | POA: Diagnosis not present

## 2020-06-26 MED ORDER — NICOTINE POLACRILEX 2 MG MT GUM: 2.0000 mg | CHEWING_GUM | OROMUCOSAL | 0 refills | Status: DC | PRN

## 2020-06-26 NOTE — Assessment & Plan Note (Signed)
Assessment: 49 year old female presenting for surgical risk stratification for upcoming elective eye surgery at the request of her surgeon.  Previous labs drawn at recent appointment are unremarkable.  Patient surgeon also requested a EKG and a chest x-ray.  EKG performed today shows sinus rhythm with occasional pvc's. Plan: -EKG as above -Chest x-ray -We will determine patient's risk stratification level pending the results of the above.  Assuming that the chest x-ray is negative the patient will be classified as low risk for a procedure which is also classified as low risk.

## 2020-06-26 NOTE — Progress Notes (Signed)
    SUBJECTIVE:   CHIEF COMPLAINT / HPI:   Surgical risk stratification: Patient is a pleasant 48 year old female presenting today for twelve-lead EKG at request of her Ladean Raya, for surgical risk stratification for elective eye surgery.  She previously been evaluated in our clinic for this however did not have time to complete the EKG and requested to come back today for it.  Previous labs were unremarkable with hemoglobin within normal limits somebody.  Patient states that the surgeon also request a chest x-ray and has a paper with that request on it.  We will also order chest x-ray for patient for surgical risk stratification.  Patient is not on any blood thinners.  Does not have any previous contraindications that she knows of to anesthesia.  Current smoker: Patient endorses recurrent smoking history of 1/2packs per day for 5years. 3-4 cigarettes per week. Interested in trying nicotine gum to fully quit.  PERTINENT  PMH / PSH: Smoking history  OBJECTIVE:   BP 100/68   Pulse 65   Wt 91 lb 9.6 oz (41.5 kg)   SpO2 98%   BMI 17.31 kg/m    General: NAD, pleasant, able to participate in exam Cardiac: RRR, no murmurs. Respiratory: CTAB, normal effort, No wheezes, rales or rhonchi Extremities: no edema or cyanosis. Skin: warm and dry, no rashes noted Neuro: alert, no obvious focal deficits Psych: Normal affect and mood  ASSESSMENT/PLAN:   Preoperative testing Assessment: 49 year old female presenting for surgical risk stratification for upcoming elective eye surgery at the request of her surgeon.  Previous labs drawn at recent appointment are unremarkable.  Patient surgeon also requested a EKG and a chest x-ray.  EKG performed today shows sinus rhythm with occasional pvc's. Plan: -EKG as above -Chest x-ray -We will determine patient's risk stratification level pending the results of the above.  Assuming that the chest x-ray is negative the patient will be classified  as low risk for a procedure which is also classified as low risk.  Current smoker Assessment: 49 year old female who is a current smoker who has previously smoked about 1/4 to 1/2 pack/day for the last 5 years and has since reduced her cigarette usage to around 3 or 4 cigarettes/week.  Patient states that at this time she is interested in trying some nicotine gum to try to help resolve the last cravings to stop smoking entirely. Plan: -Congratulated patient on the decision to quit smoking -Sent in nicotine gum 2 mg for patient to use on a as needed basis -Discussed with patient about techniques for using the gum to best resolve her cravings and to continue cutting down smoking -Patient plans to follow-up if she has any difficulty completely discontinuing smoking with the use of the gum.    [name], [place]    This note was prepared using Dragon voice recognition software and may include unintentional dictation errors due to the inherent limitations of voice recognition software.

## 2020-06-26 NOTE — Assessment & Plan Note (Signed)
Assessment: 49 year old female who is a current smoker who has previously smoked about 1/4 to 1/2 pack/day for the last 5 years and has since reduced her cigarette usage to around 3 or 4 cigarettes/week.  Patient states that at this time she is interested in trying some nicotine gum to try to help resolve the last cravings to stop smoking entirely. Plan: -Congratulated patient on the decision to quit smoking -Sent in nicotine gum 2 mg for patient to use on a as needed basis -Discussed with patient about techniques for using the gum to best resolve her cravings and to continue cutting down smoking -Patient plans to follow-up if she has any difficulty completely discontinuing smoking with the use of the gum.

## 2020-06-26 NOTE — Patient Instructions (Signed)
It was great to see you! Thank you for allowing me to participate in your care!  Our plans for today:  -We are sending you across the street for a chest x-ray to complete your surgical preop work-up.  I had a note to your chart once that x-ray returns for your surgeon to know what risk level you are for this surgery. -I am sending in a prescription for nicotine gum.  I would like for you to pop a piece of gum when you feel the nicotine cravings, chew on it until you start to get a little bit of a burning sensation in your cheek and then parking in the side of your cheek to allow the nicotine to release.  If this resolves the cravings you can remove the gum, if it does not you can chew the gum a few more times and then parked in the side of your jaw to allow the nicotine to release.  Use the gum only when needed to avoid cravings. -If you have any difficulty cutting down on the last few cigarettes please let me know and we can discuss other methods to help stop smoking -Congratulations on deciding to stop smoking I think this is great for your health!   Take care and seek immediate care sooner if you develop any concerns.   Dr. [name], [place]

## 2020-07-02 ENCOUNTER — Telehealth: Payer: Self-pay

## 2020-07-03 ENCOUNTER — Telehealth: Payer: Self-pay | Admitting: Orthopedic Surgery

## 2020-07-03 NOTE — Telephone Encounter (Signed)
Pt called stating she needs her hydrocodone  authorized pt would like to have this done today if possible. Pt states she called last week and never heard anything back and she is in a lot of pain. Please give pt a call as soon as the authorization has been put in.  [phone]

## 2020-07-03 NOTE — Telephone Encounter (Signed)
Tried calling for PA, was advised by customer service system down I will try to contact again [phone]

## 2020-07-03 NOTE — Telephone Encounter (Signed)
I called back and was able to get someone-I submitted information Michela Pitcher that it would need to be sent for further review and would be 24 to 48 hours Reference#[id number] Direct contact for PA line is [phone]

## 2020-07-06 NOTE — Telephone Encounter (Signed)
Patient LVM on nurse line regarding status of previous message on 12/9. Per note from [facility], patient was calling to ask provider if she qualifies for the healthy food benefits.  Attempted to call patient back to gather more information. No answer, left HIPAA compliant VM for patient to return call to office to discuss further.   Talbot Grumbling, RN

## 2020-07-07 ENCOUNTER — Ambulatory Visit: Payer: Medicaid Other

## 2020-07-07 NOTE — Telephone Encounter (Signed)
Received notification requesting additional information and OV notes I faxed to [facility] [phone]

## 2020-07-08 ENCOUNTER — Ambulatory Visit (INDEPENDENT_AMBULATORY_CARE_PROVIDER_SITE_OTHER): Payer: Medicaid Other | Admitting: Family Medicine

## 2020-07-08 ENCOUNTER — Other Ambulatory Visit (HOSPITAL_COMMUNITY)
Admission: RE | Admit: 2020-07-08 | Discharge: 2020-07-08 | Disposition: A | Discharge status: Home / Self Care | Payer: Medicaid Other | Source: Ambulatory Visit | Attending: Family Medicine | Admitting: Family Medicine

## 2020-07-08 ENCOUNTER — Other Ambulatory Visit: Payer: Self-pay

## 2020-07-08 VITALS — BP 92/62 | HR 72 | Ht 61.0 in | Wt 92.4 lb

## 2020-07-08 DIAGNOSIS — N898 Other specified noninflammatory disorders of vagina: Secondary | ICD-10-CM

## 2020-07-08 DIAGNOSIS — A599 Trichomoniasis, unspecified: Secondary | ICD-10-CM

## 2020-07-08 LAB — POCT WET PREP (WET MOUNT): Clue Cells Wet Prep Whiff POC: NEGATIVE

## 2020-07-08 MED ORDER — METRONIDAZOLE 500 MG PO TABS: 500.0000 mg | ORAL_TABLET | Freq: Two times a day (BID) | ORAL | 0 refills | Status: AC

## 2020-07-08 NOTE — Progress Notes (Signed)
    SUBJECTIVE:   CHIEF COMPLAINT / HPI:   Annette Chang is a 49 year old female who presents for the issue below  STD check Would like to be checked for STDs also experiencing abnormal vaginal discharge and vaginal pruritus.  No longer menstruating.  Also prior history of tubal and and endometrial ablation. Endorses using condoms during sexual encounters.  Declines blood work today.  PERTINENT  PMH / PSH: Current smoker, history of abnormal uterine bleeding  OBJECTIVE:   BP 92/62   Pulse 72   Ht 5\' 1"  (1.549 m)   Wt 92 lb 6.4 oz (41.9 kg)   SpO2 98%   BMI 17.46 kg/m   General: Appears well, no acute distress. Age appropriate. Respiratory: normal effort Pelvic exam: VULVA: normal appearing vulva with no masses, tenderness or lesions, VAGINA: normal appearing vagina with normal color and discharge, no lesions, CERVIX: normal appearing cervix without discharge or lesions, WET MOUNT done - results: excessive bacteria, trichomonads, DNA probe for chlamydia and GC obtained, exam chaperoned by Delray Alt. Results for orders placed or performed in visit on 07/08/20 (from the past 24 hour(s))  POCT Wet Prep [facility] [place])     Status: Abnormal   Collection Time: 07/08/20  9:30 AM  Result Value Ref Range   Source Wet Prep POC VAG    WBC, Wet Prep HPF POC 1-5    Bacteria Wet Prep HPF POC Many (A) Few   Clue Cells Wet Prep HPF POC None None   Clue Cells Wet Prep Whiff POC Negative Whiff    Yeast Wet Prep HPF POC None None   KOH Wet Prep POC None None   Trichomonas Wet Prep HPF POC Present (A) Absent   ASSESSMENT/PLAN:   1. Vaginal discharge Declined additional blood for STDs.  - POCT Wet Prep [facility]) - F/u Cervicovaginal ancillary only  2. Trichomoniasis Patient notified of results via phone and aware of RX to pharmacy.  - metroNIDAZOLE (FLAGYL) 500 MG tablet; Take 1 tablet (500 mg total) by mouth 2 (two) times daily for 7 days.  Dispense: 14 tablet; Refill: 0 - F/u if symptoms  fail to improve  Annette Chang, Annette Chang

## 2020-07-08 NOTE — Patient Instructions (Signed)
It was wonderful to see you today.  Please bring ALL of your medications with you to every visit.   Today you were seen for vaginal discharge and STD testing. You declined blood work.   Any testing that is abnormal I will call otherwise I will communicate via letter in the mail.   Please be sure to schedule follow up at the front  desk before you leave today.   Please call the clinic at [phone] if your symptoms worsen or you have any concerns. It was our pleasure to serve you.  Dr. Janus Molder

## 2020-07-09 ENCOUNTER — Encounter (HOSPITAL_COMMUNITY): Payer: Self-pay | Admitting: Family Medicine

## 2020-07-09 LAB — CERVICOVAGINAL ANCILLARY ONLY
Chlamydia: NEGATIVE
Comment: NEGATIVE
Comment: NORMAL
Neisseria Gonorrhea: NEGATIVE

## 2020-07-10 ENCOUNTER — Encounter: Payer: Self-pay | Admitting: Family Medicine

## 2020-07-13 ENCOUNTER — Telehealth: Payer: Self-pay | Admitting: Orthopedic Surgery

## 2020-07-13 NOTE — Telephone Encounter (Signed)
[name] from [employer] called requesting a call back about doctor's orders that was faxed over Dec. 13,2021 for home health for pt. Please call Erica back at [phone].

## 2020-07-14 NOTE — Telephone Encounter (Signed)
IC no answer. LMVM advising was returning her call

## 2020-07-14 NOTE — Telephone Encounter (Signed)
Erica with [facility] returned missed call and states she can still be reached at [phone]

## 2020-07-15 ENCOUNTER — Telehealth: Payer: Self-pay | Admitting: Orthopedic Surgery

## 2020-07-15 DIAGNOSIS — M958 Other specified acquired deformities of musculoskeletal system: Secondary | ICD-10-CM

## 2020-07-15 DIAGNOSIS — R29898 Other symptoms and signs involving the musculoskeletal system: Secondary | ICD-10-CM

## 2020-07-15 NOTE — Telephone Encounter (Signed)
I called Annette Chang, no answer and voicemail is full, unable to leave message.  Please advise if she calls back that we are unable to locate form and will need her to either fax another one to [place], or she can email it to [email or web address].

## 2020-07-15 NOTE — Telephone Encounter (Signed)
Annette Chang with [facility] called back asking for a return call. Please see previous note. The  number to contact [name] is [phone]

## 2020-07-15 NOTE — Telephone Encounter (Signed)
Erica called back. I talked with her. She is requesting order for personal care services I faxed to her attention [phone]

## 2020-07-21 ENCOUNTER — Telehealth: Payer: Self-pay | Admitting: *Deleted

## 2020-07-21 ENCOUNTER — Other Ambulatory Visit: Payer: Self-pay | Admitting: *Deleted

## 2020-07-21 ENCOUNTER — Other Ambulatory Visit: Payer: Self-pay | Admitting: Orthopedic Surgery

## 2020-07-21 DIAGNOSIS — R29898 Other symptoms and signs involving the musculoskeletal system: Secondary | ICD-10-CM

## 2020-07-21 DIAGNOSIS — M958 Other specified acquired deformities of musculoskeletal system: Secondary | ICD-10-CM

## 2020-07-21 NOTE — Telephone Encounter (Signed)
Received fax from [facility] stating referral order needs to say Home Health instead of Physical Therapy and in the comments put the same thing and to fax back to [facility] at [phone]  New referral has been placed and faxed

## 2020-08-12 ENCOUNTER — Telehealth: Payer: Self-pay | Admitting: Orthopedic Surgery

## 2020-08-12 NOTE — Telephone Encounter (Signed)
See below. Please advise.  

## 2020-08-12 NOTE — Telephone Encounter (Signed)
Sounds like she needs evaluation prior to opioids if she has swelling after a fall on ice

## 2020-08-12 NOTE — Telephone Encounter (Signed)
Patient called needing Rx refilled (Hydrocodone) Patient advised she fell twice yesterday on the ice and has swelling in her hip and leg. Patient uses [facility] on [address]. Patient asked for a call when Rx is sent to the pharmacy. The number to contact patient is [phone]

## 2020-08-13 NOTE — Telephone Encounter (Signed)
IC she has an appointment to see [name]/Luke on Wednesday.

## 2020-08-17 ENCOUNTER — Telehealth: Payer: Self-pay | Admitting: Orthopedic Surgery

## 2020-08-17 NOTE — Telephone Encounter (Signed)
Erica from [facility] and said she sent prio auth paperwork here and she did not receive it back for personal care services. Please call them [phone]

## 2020-08-19 ENCOUNTER — Ambulatory Visit (INDEPENDENT_AMBULATORY_CARE_PROVIDER_SITE_OTHER): Payer: Medicaid Other

## 2020-08-19 ENCOUNTER — Ambulatory Visit (INDEPENDENT_AMBULATORY_CARE_PROVIDER_SITE_OTHER): Payer: Medicaid Other | Admitting: Orthopedic Surgery

## 2020-08-19 ENCOUNTER — Ambulatory Visit: Payer: Self-pay

## 2020-08-19 DIAGNOSIS — M25512 Pain in left shoulder: Secondary | ICD-10-CM | POA: Diagnosis not present

## 2020-08-19 DIAGNOSIS — M25511 Pain in right shoulder: Secondary | ICD-10-CM

## 2020-08-19 DIAGNOSIS — M542 Cervicalgia: Secondary | ICD-10-CM | POA: Diagnosis not present

## 2020-08-19 NOTE — Telephone Encounter (Signed)
Patient was seen in office today and Dr Marlou Sa provided a note for her to give to her

## 2020-08-20 ENCOUNTER — Encounter: Payer: Self-pay | Admitting: Orthopedic Surgery

## 2020-08-20 NOTE — Progress Notes (Signed)
Office Visit Note   Patient: Annette Chang           Date of Birth: 03-Jan-1971           MRN: [id number] Visit Date: 08/19/2020 Requested by: [name], DO [place],  [place] [address] PCP: [name], DO  Subjective: Chief Complaint  Patient presents with  . Right Shoulder - Pain  . Left Shoulder - Pain  . Neck - Pain  . Other     Bilateral arm pain    HPI: Annette Chang presents with bilateral arm pain and neck pain.  She had a fall last Thursday going to the dumpster.  Now she reports pain in both shoulders.  Describes some stiffness and decreased range of motion.  She had an MRI of her cervical spine 321 which showed minimal nerve compression.  She fell on the right side at the dumpster and then fell on the left side.  Having some muscle spasms since that time.  Muscle relaxer and tramadol is working.  She has eye surgery scheduled for February.              ROS: All systems reviewed are negative as they relate to the chief complaint within the history of present illness.  Patient denies  fevers or chills.   Assessment & Plan: Visit Diagnoses:  1. Bilateral shoulder pain, unspecified chronicity   2. Neck pain     Plan: Impression is no acute injury to the right or left shoulder.  She has some functional disability in the right shoulder which is ongoing.  Left shoulder may have rotator cuff pathology but is functional at this time and no intervention or work-up indicated at this time.  Neck range of motion is good.  We are going to continue with her current treatment regimen.  Continue with muscle relaxers as needed and tramadol as needed and follow-up as needed.  Follow-Up Instructions: Return if symptoms worsen or fail to improve.   Orders:  Orders Placed This Encounter  Procedures  . XR Shoulder Right  . XR Shoulder Left  . XR Cervical Spine 2 or 3 views   No orders of the defined types were placed in this encounter.     Procedures: No  procedures performed   Clinical Data: No additional findings.  Objective: Vital Signs: There were no vitals taken for this visit.  Physical Exam:   Constitutional: Patient appears thin HEENT:  Head: Normocephalic Eyes:EOM are abnormal Neck: Normal range of motion Cardiovascular: Normal rate Pulmonary/chest: Effort normal Neurologic: Patient is alert Skin: Skin is warm Psychiatric: Patient has normal mood and affect    Ortho Exam: Ortho exam demonstrates good cervical spine range of motion.  Grip is intact bilaterally.  Patient appears very thin.  Scapular winging present on the right-hand side but rotator cuff strength is intact there.  On the left-hand side the patient does have forward flexion abduction both above 90 degrees.  Radial pulse intact bilaterally.  Specialty Comments:  No specialty comments available.  Imaging: No results found.   PMFS History: Patient Active Problem List   Diagnosis Date Noted  . Current smoker 06/26/2020  . Preoperative testing 06/10/2020  . Sleeping difficulty 03/16/2020  . Screening for STD (sexually transmitted disease) 09/20/2019  . Osteoporosis 12/08/2017  . 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 MDD (major depressive disorder) 03/29/2016  . Breast pain 01/18/2016  . 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 Chronic urticaria 08/06/2015  . Paresthesia 04/15/2015  .  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 Right shoulder pain 08/22/2014  . DUB (dysfunctional uterine bleeding) 03/11/2014  . H/O rotator cuff surgery 01/02/2012  . 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 Iron deficiency anemia 01/02/2012   Past Medical History:  Diagnosis Date  . Anxiety   . 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 Blood transfusion without reported diagnosis   . Depression   . H/O rotator cuff surgery 01/02/2012  . H/O tubal ligation 01/02/2012  . H/O: C-section 01/02/2012   3   . H/O: C-section 01/02/2012   3   . Headache(784.0)  01/02/2012  . Iron deficiency anemia 01/02/2012  . 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 Diabetes Maternal Grandmother   . Diabetes Paternal Grandmother   . Diabetes Paternal Grandfather   . Colon cancer Neg Hx   . Esophageal cancer Neg Hx   . Rectal cancer Neg Hx   . Stomach cancer Neg Hx     Past Surgical History:  Procedure Laterality Date  . CESAREAN SECTION     3 previous c sections  . MUSCLE REPAIR Right 01/10/2017   RIGHT PECTORAL MAJOR TO SCAPULA MUSCLE TRANSFER (Right  . NOVASURE ABLATION N/A 03/11/2014   Procedure: NOVASURE ABLATION;  Surgeon: Emily Filbert, MD;  Location: [facility];  Service: Gynecology;  Laterality: N/A;  . PECTORALIS TENDON REPAIR Right 01/10/2017   Procedure: RIGHT PECTORAL MAJOR TO SCAPULA MUSCLE TRANSFER;  Surgeon: Meredith Pel, MD;  Location: [facility];  Service: Orthopedics;  Laterality: Right;  . PECTORALIS TENDON REPAIR Right 12/28/2017   Procedure: RIGHT REVISION TENDON TRANSFER OF PECTORALIS MAJOR;  Surgeon: Meredith Pel, MD;  Location: [facility];  Service: Orthopedics;  Laterality: Right;  . rotator cuff surgery    . SHOULDER SURGERY    . TUBAL LIGATION     Social History   Occupational History  . Occupation: unemployed  Tobacco Use  . Smoking status: Former Smoker    Years: 5.00    Types: Cigarettes    Quit date: 11/2017    Years since quitting: 2.7  . Smokeless tobacco: Never Used  Vaping Use  . Vaping Use: Never used  Substance and Sexual Activity  . Alcohol use: Yes    Alcohol/week: 14.0 standard drinks    Types: 14 Glasses of wine per week    Comment: weekends  . Drug use: Yes    Comment: marijuana used 3 times a weekly  . Sexual activity: Not Currently    Birth control/protection: Surgical

## 2020-08-24 ENCOUNTER — Telehealth: Payer: Self-pay | Admitting: Orthopedic Surgery

## 2020-08-24 NOTE — Telephone Encounter (Signed)
Erica with [facility] [employer] called asking about paperwork she had faxed over; I did let her know per previous messages Dr.[name] gave the pt a note to give to [place] at her appt on 08/17/20 and [name] states the pt never called her to give the info. [name] would like a copy of the note Dr.[name] gave the pt faxed over please   Fax# [phone] [facility] CB# [phone]

## 2020-08-25 NOTE — Telephone Encounter (Signed)
Also called Annette Chang, no answer no VM to LM.  Copy of hand written note that was given to patient has been sent to scan center. Per Dr Marlou Sa, note mentioned that patient would benefit from home assistance until time of her eye surgery and that after that eye surgeon should comment on whether or not patient would benefit.

## 2020-08-25 NOTE — Telephone Encounter (Signed)
LMVM for patient to return my call to discuss.

## 2020-09-09 ENCOUNTER — Other Ambulatory Visit: Payer: Self-pay | Admitting: Ophthalmology

## 2020-09-09 DIAGNOSIS — H5010 Unspecified exotropia: Secondary | ICD-10-CM | POA: Diagnosis not present

## 2020-09-09 DIAGNOSIS — H53032 Strabismic amblyopia, left eye: Secondary | ICD-10-CM | POA: Diagnosis not present

## 2020-09-09 DIAGNOSIS — H5015 Alternating exotropia: Secondary | ICD-10-CM | POA: Diagnosis not present

## 2020-09-09 DIAGNOSIS — H11222 Conjunctival granuloma, left eye: Secondary | ICD-10-CM | POA: Diagnosis not present

## 2020-09-09 DIAGNOSIS — H11442 Conjunctival cysts, left eye: Secondary | ICD-10-CM | POA: Diagnosis not present

## 2020-09-09 DIAGNOSIS — H538 Other visual disturbances: Secondary | ICD-10-CM | POA: Diagnosis not present

## 2020-09-18 ENCOUNTER — Telehealth: Payer: Self-pay

## 2020-09-18 NOTE — Telephone Encounter (Signed)
Noted  

## 2020-09-18 NOTE — Telephone Encounter (Signed)
[name] called stating she is going to fax over a form so that the patient can have personal care.  She said you can contact her with any other questions

## 2020-10-02 ENCOUNTER — Telehealth: Payer: Self-pay | Admitting: Orthopedic Surgery

## 2020-10-02 NOTE — Telephone Encounter (Signed)
Erica from [employer] called. She says she has not received the signed form back from Dr. Marlou Sa for patient's personal help. Her Fax# [phone]. Her call back number is [phone]

## 2020-10-02 NOTE — Telephone Encounter (Signed)
Never received. She will email me form.

## 2020-10-05 ENCOUNTER — Other Ambulatory Visit: Payer: Self-pay

## 2020-10-05 ENCOUNTER — Telehealth: Payer: Self-pay

## 2020-10-05 DIAGNOSIS — M810 Age-related osteoporosis without current pathological fracture: Secondary | ICD-10-CM

## 2020-10-05 NOTE — Telephone Encounter (Signed)
patient called she is requesting rx refill for hydrocodone and methocarbamol and call back:[phone]

## 2020-10-06 MED ORDER — ALENDRONATE SODIUM 70 MG PO TABS: 70.0000 mg | ORAL_TABLET | ORAL | 11 refills | Status: DC

## 2020-10-06 MED ORDER — SERTRALINE HCL 50 MG PO TABS: ORAL_TABLET | ORAL | 0 refills | Status: DC

## 2020-10-06 NOTE — Telephone Encounter (Signed)
Ok to [employer] and robaxin  what is status of pain med rx

## 2020-10-07 ENCOUNTER — Other Ambulatory Visit: Payer: Self-pay | Admitting: Orthopedic Surgery

## 2020-10-07 ENCOUNTER — Telehealth: Payer: Self-pay

## 2020-10-07 MED ORDER — TRAMADOL HCL 50 MG PO TABS: 50.0000 mg | ORAL_TABLET | Freq: Two times a day (BID) | ORAL | 0 refills | Status: DC | PRN

## 2020-10-07 NOTE — Telephone Encounter (Signed)
Tried calling to advise medication sent.  Can you please provide update on pain mgmt referral?

## 2020-10-07 NOTE — Telephone Encounter (Signed)
Ultram sent. Shows she has allergy to robaxin.  Please find out status of pain mgmt referral. thx

## 2020-10-07 NOTE — Telephone Encounter (Signed)
Can you please submit?

## 2020-10-07 NOTE — Telephone Encounter (Signed)
[facility] is calling back asking about the fax and e-mail.  [phone]  [name]

## 2020-10-09 NOTE — Telephone Encounter (Signed)
General 04/29/2020  1:50 PM Humphrey, Marthe Patch, RT - -  Note   Received fax from [place] with [facility] in reference to status check of patients pain referral from previous message. They have attempted to contact patient with no responses.   02/26/20 - 1. LVM               2. LVM with family member   02/27/20 - 3.  LVM              4.  Answered and hung up          I treid calling pt this a.m and no answer.

## 2020-10-13 NOTE — Patient Instructions (Incomplete)
It was great to see you! Thank you for allowing me to participate in your care!  I recommend that you always bring your medications to each appointment as this makes it easy to ensure we are on the correct medications and helps [place] not miss when refills are needed.  Our plans for today:  - *** -   We are checking some labs today, I will call you if they are abnormal will send you a MyChart message or a letter if they are normal.  If you do not hear about your labs in the next 2 weeks please let us know.***  Take care and seek immediate care sooner if you develop any concerns.   Dr. [name], [place]

## 2020-10-13 NOTE — Progress Notes (Deleted)
    SUBJECTIVE:   CHIEF COMPLAINT / HPI:   Medication management: Patient 50 year old female presents today for medication management.  PERTINENT  PMH / PSH: ***  OBJECTIVE:   There were no vitals taken for this visit.   General: NAD, pleasant, able to participate in exam Cardiac: RRR, no murmurs. Respiratory: CTAB, normal effort, No wheezes, rales or rhonchi Abdomen: Bowel sounds present, nontender, nondistended, no hepatosplenomegaly. Extremities: no edema or cyanosis. Skin: warm and dry, no rashes noted Neuro: alert, no obvious focal deficits Psych: Normal affect and mood  ASSESSMENT/PLAN:   No problem-specific Assessment & Plan notes found for this encounter.     [name], [place]    This note was prepared using Dragon voice recognition software and may include unintentional dictation errors due to the inherent limitations of voice recognition software.  {    This will disappear when note is signed, click to select method of visit    :1}

## 2020-10-14 ENCOUNTER — Ambulatory Visit: Payer: Medicaid Other | Admitting: Family Medicine

## 2020-10-19 ENCOUNTER — Ambulatory Visit: Payer: Medicaid Other | Admitting: Orthopedic Surgery

## 2020-10-21 NOTE — Telephone Encounter (Signed)
Error

## 2020-10-29 ENCOUNTER — Ambulatory Visit: Payer: Medicaid Other | Admitting: Orthopedic Surgery

## 2020-10-29 ENCOUNTER — Other Ambulatory Visit: Payer: Self-pay | Admitting: Surgical

## 2020-10-30 NOTE — Telephone Encounter (Signed)
Please advise 

## 2020-11-26 NOTE — Progress Notes (Signed)
    SUBJECTIVE:   CHIEF COMPLAINT / HPI:   Fingernail complaint: Patient is a 50 year old female that presents today for several weeks/months of worsening fingernails.  She states she has multiple fingernails that appear as if they had almost "deteriorated" towards the base of the nail.  She denies any trauma or corrosive material that would have been on these nails.  Denies this ever happening in the past.  She states these are nonpainful and did not have any drainage.  She denies any new medications.Marland Kitchen  PERTINENT  PMH / PSH: None relevant  OBJECTIVE:   BP 100/82   Pulse 71   Ht 5\' 1"  (1.549 m)   Wt 94 lb 12.8 oz (43 kg)   SpO2 98%   BMI 17.91 kg/m    General: NAD, pleasant, able to participate in exam Respiratory: No respiratory distress Derm: Multiple fingernails with small areas that appear almost excoriated I seen in the images below.  Patient denies trauma to these areas.  There is no obvious drainage or erythema.  These areas are not painful. Psych: Normal affect and mood            ASSESSMENT/PLAN:   Irregular finger nails Assessment: 50 year old female with multiple fingernails with irregularities to them that look almost excoriated or if they had some sort of corrosive/deterioration towards the nailbed.  These areas are not associated with trauma, they are nonpainful, and she has never had that happen before.  She is not on any medications that would make me think of nail damage.  She has noticed these over the past several months.  Patient denies systemic symptoms. I am unsure of the etiology of these.  I spoke with Dr. Ardelia Mems as well as flow-through cervical dermatology textbooks for similar nail findings and we were both unsuccessful at this.  Differential may include things such as inflammatory conditions versus onychomycosis, however I am unsure at this time.  We will place the referral for dermatology for further evaluation. Plan: -We will place referral for  dermatology    [name], [place]    This note was prepared using Dragon voice recognition software and may include unintentional dictation errors due to the inherent limitations of voice recognition software.

## 2020-11-26 NOTE — Patient Instructions (Signed)
It was great to see you! Thank you for allowing me to participate in your care!  I recommend that you always bring your medications to each appointment as this makes it easy to ensure we are on the correct medications and helps [place] not miss when refills are needed.  Our plans for today:  -We are sending a referral for dermatology to evaluate your fingernails.  If you do not hear from them in the next 1-2 weeks please let me know.  Take care and seek immediate care sooner if you develop any concerns.   Dr. [name], [place]

## 2020-11-27 ENCOUNTER — Other Ambulatory Visit: Payer: Self-pay

## 2020-11-27 ENCOUNTER — Ambulatory Visit (INDEPENDENT_AMBULATORY_CARE_PROVIDER_SITE_OTHER): Payer: Medicaid Other

## 2020-11-27 ENCOUNTER — Ambulatory Visit (INDEPENDENT_AMBULATORY_CARE_PROVIDER_SITE_OTHER): Payer: Medicaid Other | Admitting: Family Medicine

## 2020-11-27 ENCOUNTER — Encounter: Payer: Self-pay | Admitting: Family Medicine

## 2020-11-27 VITALS — BP 100/82 | HR 71 | Ht 61.0 in | Wt 94.8 lb

## 2020-11-27 DIAGNOSIS — Z23 Encounter for immunization: Secondary | ICD-10-CM

## 2020-11-27 DIAGNOSIS — M810 Age-related osteoporosis without current pathological fracture: Secondary | ICD-10-CM

## 2020-11-27 DIAGNOSIS — L609 Nail disorder, unspecified: Secondary | ICD-10-CM | POA: Diagnosis not present

## 2020-11-27 MED ORDER — ALENDRONATE SODIUM 70 MG PO TABS: 70.0000 mg | ORAL_TABLET | ORAL | 11 refills | Status: DC

## 2020-11-27 NOTE — Assessment & Plan Note (Addendum)
Assessment: 50 year old female with multiple fingernails with irregularities to them that look almost excoriated or if they had some sort of corrosive/deterioration towards the nailbed.  These areas are not associated with trauma, they are nonpainful, and she has never had that happen before.  She is not on any medications that would make me think of nail damage.  She has noticed these over the past several months.  Patient denies systemic symptoms. I am unsure of the etiology of these.  I spoke with Dr. Ardelia Mems as well as flow-through cervical dermatology textbooks for similar nail findings and we were both unsuccessful at this.  Differential may include things such as inflammatory conditions versus onychomycosis, however I am unsure at this time.  We will place the referral for dermatology for further evaluation. Plan: -We will place referral for dermatology

## 2020-12-04 ENCOUNTER — Other Ambulatory Visit: Payer: Self-pay | Admitting: Family Medicine

## 2020-12-08 DIAGNOSIS — M810 Age-related osteoporosis without current pathological fracture: Secondary | ICD-10-CM | POA: Diagnosis not present

## 2020-12-09 DIAGNOSIS — M810 Age-related osteoporosis without current pathological fracture: Secondary | ICD-10-CM | POA: Diagnosis not present

## 2020-12-14 DIAGNOSIS — M810 Age-related osteoporosis without current pathological fracture: Secondary | ICD-10-CM | POA: Diagnosis not present

## 2020-12-16 DIAGNOSIS — M810 Age-related osteoporosis without current pathological fracture: Secondary | ICD-10-CM | POA: Diagnosis not present

## 2020-12-21 DIAGNOSIS — M810 Age-related osteoporosis without current pathological fracture: Secondary | ICD-10-CM | POA: Diagnosis not present

## 2020-12-22 DIAGNOSIS — M810 Age-related osteoporosis without current pathological fracture: Secondary | ICD-10-CM | POA: Diagnosis not present

## 2020-12-23 DIAGNOSIS — M810 Age-related osteoporosis without current pathological fracture: Secondary | ICD-10-CM | POA: Diagnosis not present

## 2020-12-23 NOTE — Progress Notes (Deleted)
    SUBJECTIVE:   CHIEF COMPLAINT / HPI:   Encounter for Pap smear: Patient is a 50 year old female who presents today for Pap smear. Per chart review she previously had Pap smear on 09/20/2019 which was negative other than showing positive for trichomonas.  She states***.  Current smoker: Patient is a current smoker.  Smokes***.  Has previous use of nicotine gum.  PERTINENT  PMH / PSH: ***  OBJECTIVE:   There were no vitals taken for this visit.   General: Alert and oriented in no apparent distress Heart: Regular rate and rhythm with no murmurs appreciated Lungs: CTA bilaterally, no wheezing Abdomen: Bowel sounds present, no abdominal pain Pelvic exam: {pelvic exam:315900::"normal external genitalia, vulva, vagina, cervix, uterus and adnexa"}.  Chaperone:*** Extremities: No lower extremity edema   ASSESSMENT/PLAN:   No problem-specific Assessment & Plan notes found for this encounter.   Assessment: 50 year old female with*** Plan: -***  Assessment: 50 year old female who is a current smoker with***packs per day.  She has previously tried nicotine gum. Plan:-***  [name], DO [place]    This note was prepared using Dragon voice recognition software and may include unintentional dictation errors due to the inherent limitations of voice recognition software.  {    This will disappear when note is signed, click to select method of visit    :1}

## 2020-12-24 ENCOUNTER — Ambulatory Visit: Payer: Medicaid Other | Admitting: Family Medicine

## 2020-12-28 DIAGNOSIS — M810 Age-related osteoporosis without current pathological fracture: Secondary | ICD-10-CM | POA: Diagnosis not present

## 2020-12-29 DIAGNOSIS — M810 Age-related osteoporosis without current pathological fracture: Secondary | ICD-10-CM | POA: Diagnosis not present

## 2020-12-30 DIAGNOSIS — M810 Age-related osteoporosis without current pathological fracture: Secondary | ICD-10-CM | POA: Diagnosis not present

## 2021-01-04 DIAGNOSIS — M810 Age-related osteoporosis without current pathological fracture: Secondary | ICD-10-CM | POA: Diagnosis not present

## 2021-01-05 DIAGNOSIS — M810 Age-related osteoporosis without current pathological fracture: Secondary | ICD-10-CM | POA: Diagnosis not present

## 2021-01-06 DIAGNOSIS — M810 Age-related osteoporosis without current pathological fracture: Secondary | ICD-10-CM | POA: Diagnosis not present

## 2021-01-11 DIAGNOSIS — M810 Age-related osteoporosis without current pathological fracture: Secondary | ICD-10-CM | POA: Diagnosis not present

## 2021-01-12 DIAGNOSIS — M810 Age-related osteoporosis without current pathological fracture: Secondary | ICD-10-CM | POA: Diagnosis not present

## 2021-01-13 DIAGNOSIS — M810 Age-related osteoporosis without current pathological fracture: Secondary | ICD-10-CM | POA: Diagnosis not present

## 2021-01-18 DIAGNOSIS — M810 Age-related osteoporosis without current pathological fracture: Secondary | ICD-10-CM | POA: Diagnosis not present

## 2021-01-18 NOTE — Progress Notes (Signed)
    SUBJECTIVE:   CHIEF COMPLAINT / HPI:   Vaginal discharge 50 year old female with vaginal discharge for 4-5 days.  She is also endorsing a little bit of lower abdominal discomfort.  She states she think she may have trichomonas.  She denies any urinary frequency or dysuria symptoms.  She states she has had trichomonas in the past and thinks this may be what is going on.  She states she has had a little bit of abdominal discomfort for the past few weeks.  She had a D&C in the past for abnormal uterine bleeding.  Abdominal discomfort: She states this is been going on for many weeks.  It is generalized and sometimes feels like a sharp pain.  It does not occur every day, maybe every 2 or 3 days.  She denies any suprapubic discomfort.  Denies any dysuria or increasing urinary frequency.  States that she has had a history of constipation in the past but does not feel constipated at this time.  States that she had a D&C performed for heavy uterine bleeding in the past and does not have concern of pregnancy today.  She denies fevers or chills.  PERTINENT  PMH / PSH: None relevant  OBJECTIVE:   BP 127/85   Pulse (!) 51   Ht 5\' 1"  (1.549 m)   Wt 90 lb 3.2 oz (40.9 kg)   SpO2 98%   BMI 17.04 kg/m   General: NAD, pleasant, able to participate in exam Cardiac: RRR, no murmurs. Respiratory: CTAB, normal effort, No wheezes, rales or rhonchi Abdomen: Bowel sounds present, mild discomfort to palpation in the suprapubic area Extremities: no edema or cyanosis. Pelvic exam: normal external genitalia, vulva, vagina, cervix, uterus and adnexa, VULVA: normal appearing vulva with no masses, tenderness or lesions, VAGINA: normal appearing vagina with normal color and discharge, no lesions.  Chaperone Alexis Neuro: alert, no obvious focal deficits Psych: Normal affect and mood  ASSESSMENT/PLAN:   Abdominal discomfort Has been going on for several weeks.  Only happens on occasion, every 2 or 3 days and  it is described as a sharp pain somewhere in her central abdomen.  We will check a urine pregnancy test.  She does have some discomfort in the suprapubic region with palpation so we will check a urinalysis.  We will check gonorrhea/chlamydia to look for PID, I am reassured that patient did not have discomfort on pelvic exam.  She denies any fevers or chills.  Vaginal discharge 50 year old female with vaginal discharge for the past 4 to 5 days.  She has a history of trichomonas infection.  She would like full STD testing today.  Wet prep performed shows trichomonas.  We will send in metronidazole for 7 days.   Vaginal discharge:   Abdominal discomfort:   [name], [place]

## 2021-01-19 DIAGNOSIS — M810 Age-related osteoporosis without current pathological fracture: Secondary | ICD-10-CM | POA: Diagnosis not present

## 2021-01-20 ENCOUNTER — Other Ambulatory Visit: Payer: Self-pay

## 2021-01-20 ENCOUNTER — Ambulatory Visit (INDEPENDENT_AMBULATORY_CARE_PROVIDER_SITE_OTHER): Payer: Medicaid Other | Admitting: Family Medicine

## 2021-01-20 ENCOUNTER — Encounter: Payer: Self-pay | Admitting: Family Medicine

## 2021-01-20 VITALS — BP 127/85 | HR 51 | Ht 61.0 in | Wt 90.2 lb

## 2021-01-20 DIAGNOSIS — R109 Unspecified abdominal pain: Secondary | ICD-10-CM

## 2021-01-20 DIAGNOSIS — N898 Other specified noninflammatory disorders of vagina: Secondary | ICD-10-CM

## 2021-01-20 DIAGNOSIS — Z113 Encounter for screening for infections with a predominantly sexual mode of transmission: Secondary | ICD-10-CM | POA: Diagnosis not present

## 2021-01-20 DIAGNOSIS — M810 Age-related osteoporosis without current pathological fracture: Secondary | ICD-10-CM | POA: Diagnosis not present

## 2021-01-20 LAB — POCT WET PREP (WET MOUNT): Clue Cells Wet Prep Whiff POC: NEGATIVE

## 2021-01-20 LAB — POCT URINE PREGNANCY: Preg Test, Ur: NEGATIVE

## 2021-01-20 MED ORDER — METRONIDAZOLE 250 MG PO TABS: 500.0000 mg | ORAL_TABLET | Freq: Three times a day (TID) | ORAL | 0 refills | Status: AC

## 2021-01-20 NOTE — Patient Instructions (Addendum)
Your wet prep showed trichomonas.  I am sending in an antibiotic for you to take for the next 7 days.  I recommend that you abstain from sex during that time and that you have your partner see their primary care doctors to get treatment as well.  We are also checking for gonorrhea, chlamydia, syphilis, HIV and I will let you know the results of these when they return.  We checked a urine pregnancy test and a urinalysis today which did not show any obvious cause of your abdominal discomfort.  I do recommend we recheck urinalysis in a few months because it showed a little bit of blood in the urine.  Generally this is nothing to worry about but we should recheck it in a few months.

## 2021-01-20 NOTE — Assessment & Plan Note (Signed)
50 year old female with vaginal discharge for the past 4 to 5 days.  She has a history of trichomonas infection.  She would like full STD testing today.  Wet prep performed shows trichomonas.  We will send in metronidazole for 7 days.

## 2021-01-20 NOTE — Assessment & Plan Note (Addendum)
Has been going on for several weeks.  Only happens on occasion, every 2 or 3 days and it is described as a sharp pain somewhere in her central abdomen.  We will check a urine pregnancy test which was negative.  She does have some discomfort in the suprapubic region with palpation so we will check a urinalysis.  The urinalysis did not show any nitrite or leukocyte Estrace.  It did show a tiny amount of blood and so I recommend her to recheck it in a few months.  We will check gonorrhea/chlamydia to look for PID, I am reassured that patient did not have discomfort on pelvic exam.  She denies any fevers or chills.

## 2021-01-25 DIAGNOSIS — M810 Age-related osteoporosis without current pathological fracture: Secondary | ICD-10-CM | POA: Diagnosis not present

## 2021-01-26 DIAGNOSIS — M810 Age-related osteoporosis without current pathological fracture: Secondary | ICD-10-CM | POA: Diagnosis not present

## 2021-01-27 DIAGNOSIS — M810 Age-related osteoporosis without current pathological fracture: Secondary | ICD-10-CM | POA: Diagnosis not present

## 2021-01-30 ENCOUNTER — Telehealth: Payer: Self-pay | Admitting: Family Medicine

## 2021-01-30 ENCOUNTER — Emergency Department (HOSPITAL_COMMUNITY): Payer: Medicaid Other

## 2021-01-30 ENCOUNTER — Other Ambulatory Visit: Payer: Self-pay

## 2021-01-30 ENCOUNTER — Emergency Department (HOSPITAL_COMMUNITY)
Admission: EM | Admit: 2021-01-30 | Discharge: 2021-01-30 | Disposition: A | Discharge status: Home / Self Care | Payer: Medicaid Other | Attending: Emergency Medicine | Admitting: Emergency Medicine

## 2021-01-30 ENCOUNTER — Encounter (HOSPITAL_COMMUNITY): Payer: Self-pay | Admitting: Emergency Medicine

## 2021-01-30 DIAGNOSIS — R079 Chest pain, unspecified: Secondary | ICD-10-CM | POA: Diagnosis not present

## 2021-01-30 DIAGNOSIS — R1013 Epigastric pain: Secondary | ICD-10-CM | POA: Diagnosis not present

## 2021-01-30 DIAGNOSIS — R0602 Shortness of breath: Secondary | ICD-10-CM | POA: Diagnosis not present

## 2021-01-30 DIAGNOSIS — Z87891 Personal history of nicotine dependence: Secondary | ICD-10-CM | POA: Insufficient documentation

## 2021-01-30 DIAGNOSIS — R0789 Other chest pain: Secondary | ICD-10-CM | POA: Insufficient documentation

## 2021-01-30 DIAGNOSIS — R42 Dizziness and giddiness: Secondary | ICD-10-CM | POA: Diagnosis not present

## 2021-01-30 LAB — BASIC METABOLIC PANEL
Anion gap: 8 (ref 5–15)
BUN: 6 mg/dL (ref 6–20)
CO2: 25 mmol/L (ref 22–32)
Calcium: 9.5 mg/dL (ref 8.9–10.3)
Chloride: 104 mmol/L (ref 98–111)
Creatinine, Ser: 0.49 mg/dL (ref 0.44–1.00)
GFR, Estimated: >60 mL/min (ref >60)
Glucose, Bld: 96 mg/dL (ref 70–99)
Potassium: 3.2 mmol/L — ABNORMAL LOW (ref 3.5–5.1)
Sodium: 137 mmol/L (ref 135–145)

## 2021-01-30 LAB — HEPATIC FUNCTION PANEL
ALT: 21 U/L (ref 0–44)
AST: 31 U/L (ref 15–41)
Albumin: 3.8 g/dL (ref 3.5–5.0)
Alkaline Phosphatase: 66 U/L (ref 38–126)
Bilirubin, Direct: 0.5 mg/dL — ABNORMAL HIGH (ref 0.0–0.2)
Indirect Bilirubin: 1.2 mg/dL — ABNORMAL HIGH (ref 0.3–0.9)
Total Bilirubin: 1.7 mg/dL — ABNORMAL HIGH (ref 0.3–1.2)
Total Protein: 6.6 g/dL (ref 6.5–8.1)

## 2021-01-30 LAB — CBC
HCT: 42.9 % (ref 36.0–46.0)
Hemoglobin: 14.6 g/dL (ref 12.0–15.0)
MCH: 31.2 pg (ref 26.0–34.0)
MCHC: 34 g/dL (ref 30.0–36.0)
MCV: 91.7 fL (ref 80.0–100.0)
Platelets: 189 10*3/uL (ref 150–400)
RBC: 4.68 MIL/uL (ref 3.87–5.11)
RDW: 12.1 % (ref 11.5–15.5)
WBC: 9.9 10*3/uL (ref 4.0–10.5)
nRBC: 0 % (ref 0.0–0.2)

## 2021-01-30 LAB — TROPONIN I (HIGH SENSITIVITY)
Troponin I (High Sensitivity): 6 ng/L (ref <18)
Troponin I (High Sensitivity): 7 ng/L (ref <18)

## 2021-01-30 LAB — LIPASE, BLOOD: Lipase: 28 U/L (ref 11–51)

## 2021-01-30 LAB — I-STAT BETA HCG BLOOD, ED (MC, WL, AP ONLY): I-stat hCG, quantitative: <5 m[IU]/mL (ref <5)

## 2021-01-30 MED ORDER — ALUM & MAG HYDROXIDE-SIMETH 200-200-20 MG/5ML PO SUSP
30.0000 mL | Freq: Once | ORAL | Status: AC
  Administered 2021-01-30: 30 mL via ORAL
  Filled 2021-01-30: qty 30

## 2021-01-30 MED ORDER — ASPIRIN 81 MG PO CHEW
324.0000 mg | CHEWABLE_TABLET | Freq: Once | ORAL | Status: AC
  Administered 2021-01-30: 324 mg via ORAL
  Filled 2021-01-30: qty 4

## 2021-01-30 NOTE — Telephone Encounter (Signed)
**  AFTER HOURS EMERGENCY LINE CALL**  Received page regarding after-hours call for patient with birthday 1971-03-06 and phone number [phone].  Chart review indicates this patient is Annette Chang, 50 year old female.  Called at 9:58 AM, line rang busy x2.  No option for voicemail.  Will try again later.  Ezequiel Essex, MD

## 2021-01-30 NOTE — Discharge Instructions (Addendum)
Your troponins were negative.  This makes it very unlikely that you are having a heart attack.  Please follow-up with your doctor in the office.  Try pepcid or tagamet up to twice a day.  Try to avoid things that may make this worse, most commonly these are spicy foods tomato based products fatty foods chocolate and peppermint.  Alcohol and tobacco can also make this worse.  Return to the emergency department for sudden worsening pain fever or inability to eat or drink.

## 2021-01-30 NOTE — ED Triage Notes (Signed)
C/o pain to center of chest, SOB, and R arm pain x 2 days.  Taking Tums and Rolaids without relief.

## 2021-01-30 NOTE — ED Provider Notes (Signed)
[facility] EMERGENCY DEPARTMENT Provider Note   CSN: [id number] Arrival date & time: 01/30/21  1135     History No chief complaint on file.   Annette Chang is a 50 y.o. female.  50 yo F with a chief complaint of chest pain.  Going on for a couple days.  Seems to come and go.  She is disabled and so denies any exertional component.  Denies cough congestion or fever denies nausea vomiting or diarrhea.  She thought maybe it was her stomach and so she is taken multiple medications tums, neighbors acid pill.  This is been without relief and so she was concerned she was having a heart attack and so came here.  Patient denies history of MI, denies hypertension hyperlipidemia diabetes or smoking.  Denies family history of MI.  Patient denies history of PE or DVT denies hemoptysis denies unilateral lower extremity edema denies recent surgery immobilization hospitalization estrogen use or history of cancer.    The history is provided by the patient.  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   Blood transfusion without reported diagnosis    Depression    H/O rotator cuff surgery 01/02/2012   H/O tubal ligation 01/02/2012   H/O: C-section 01/02/2012   3    H/O: C-section 01/02/2012   3    Headache(784.0) 01/02/2012   Iron deficiency anemia 01/02/2012   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    Patient Active Problem List   Diagnosis Date  Noted   Abdominal discomfort 01/20/2021   Irregular finger nails 11/27/2020   Current smoker 06/26/2020   Sleeping difficulty 03/16/2020   Vaginal discharge 11/15/2019   Screening for STD (sexually transmitted disease) 09/20/2019   Osteoporosis 12/08/2017   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   MDD (major depressive disorder) 03/29/2016   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   Chronic urticaria 08/06/2015   Winged scapula of right side 10/06/2014   Right shoulder pain 08/22/2014   H/O rotator cuff surgery 01/02/2012   Arthritis of shoulder region, 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   Iron deficiency anemia 01/02/2012    Past Surgical History:  Procedure Laterality Date   CESAREAN SECTION     3 previous c sections   MUSCLE REPAIR Right 01/10/2017   RIGHT PECTORAL MAJOR TO SCAPULA MUSCLE TRANSFER (Right   NOVASURE ABLATION N/A 03/11/2014   Procedure: NOVASURE ABLATION;  Surgeon: Emily Filbert, MD;  Location: [facility];  Service: Gynecology;  Laterality: N/A;   PECTORALIS TENDON REPAIR Right 01/10/2017   Procedure: RIGHT PECTORAL MAJOR TO SCAPULA MUSCLE TRANSFER;  Surgeon: Meredith Pel, MD;  Location: [facility];  Service: Orthopedics;  Laterality: Right;   PECTORALIS TENDON REPAIR Right 12/28/2017   Procedure: RIGHT REVISION TENDON TRANSFER OF PECTORALIS MAJOR;  Surgeon: Meredith Pel, MD;  Location: [facility];  Service: Orthopedics;  Laterality: Right;   rotator cuff surgery     SHOULDER SURGERY  TUBAL LIGATION       OB History     Gravida  3   Para  3   Term  3   Preterm      AB      Living  2      SAB      IAB      Ectopic      Multiple      Live Births              Family History  Problem Relation Age of Onset   Diabetes Maternal Grandmother    Diabetes Paternal Grandmother    Diabetes Paternal Grandfather    Colon cancer Neg Hx    Esophageal cancer Neg Hx    Rectal cancer Neg Hx    Stomach cancer Neg Hx     Social History   Tobacco Use   Smoking status: Former     Years: 5.00    Pack years: 0.00    Types: Cigarettes    Quit date: 11/2017    Years since quitting: 3.1   Smokeless tobacco: Never  Vaping Use   Vaping Use: Never used  Substance Use Topics   Alcohol use: Yes    Alcohol/week: 14.0 standard drinks    Types: 14 Glasses of wine per week    Comment: weekends   Drug use: Yes    Comment: marijuana used 3 times a weekly    Home Medications Prior to Admission medications   Medication Sig Start Date End Date Taking? Authorizing Provider  alendronate (FOSAMAX) 70 MG tablet Take 1 tablet (70 mg total) by mouth every 7 (seven) days. Take with a full glass of water on an empty stomach. Patient taking differently: Take 70 mg by mouth every 7 (seven) days. Take with a full glass of water on an empty stomach. Mondays 11/27/20  Yes Welborn, Ryan, DO  calcium carbonate (TUMS - DOSED IN MG ELEMENTAL CALCIUM) 500 MG chewable tablet Chew 1 tablet by mouth daily as needed for indigestion or heartburn.   Yes [provider]  Calcium Carbonate Antacid (ALKA-SELTZER ANTACID PO) Take 1 tablet by mouth daily as needed (gas, flatulance).   Yes [provider]  Calcium-Vitamin D-Vitamin K 500-500-40 MG-UNT-MCG CHEW Chew 1 tablet by mouth daily.   Yes [provider]  ketorolac (ACULAR) 0.4 % SOLN Place 2 drops into the left eye daily. 01/08/21  Yes [provider]  methocarbamol (ROBAXIN) 500 MG tablet TAKE 1 TABLET(500 MG) BY MOUTH EVERY 12 HOURS Patient taking differently: Take 500 mg by mouth daily as needed for muscle spasms. 10/30/20  Yes Magnant, Charles L, PA-C  sertraline (ZOLOFT) 50 MG tablet TAKE 1 TABLET(50 MG) BY MOUTH DAILY Patient taking differently: Take 50 mg by mouth daily as needed (depression). 12/07/20  Yes Welborn, Ryan, DO  traMADol (ULTRAM) 50 MG tablet Take 1 tablet (50 mg total) by mouth every 12 (twelve) hours as needed. Patient taking differently: Take 50 mg by mouth every 12 (twelve) hours as needed for  moderate pain. 10/07/20  Yes Meredith Pel, MD  vitamin C (ASCORBIC ACID) 500 MG tablet Take 500 mg by mouth daily.    Yes [provider]  HYDROcodone-acetaminophen (NORCO/VICODIN) 5-325 MG tablet Take 1 tablet by mouth every 12 (twelve) hours as needed for moderate pain. Patient not taking: No sig reported 06/24/20   Meredith Pel, MD  ibuprofen (ADVIL) 600 MG tablet Take 1 tablet (600 mg total) by mouth 3 (  three) times daily. As needed Patient not taking: No sig reported 06/10/20   Gladys Damme, MD  nicotine polacrilex (RA NICOTINE GUM) 2 MG gum Take 1 each (2 mg total) by mouth as needed for smoking cessation. Patient not taking: No sig reported 06/26/20   [name], DO    Allergies    Percocet [oxycodone-acetaminophen] and Robaxin [methocarbamol]  Review of Systems   Review of Systems  Constitutional:  Negative for chills and fever.  HENT:  Negative for congestion and rhinorrhea.   Eyes:  Negative for redness and visual disturbance.  Respiratory:  Positive for shortness of breath. Negative for wheezing.   Cardiovascular:  Positive for chest pain. Negative for palpitations.  Gastrointestinal:  Negative for nausea and vomiting.  Genitourinary:  Negative for dysuria and urgency.  Musculoskeletal:  Negative for arthralgias and myalgias.  Skin:  Negative for pallor and wound.  Neurological:  Negative for dizziness and headaches.   Physical Exam Updated Vital Signs BP 129/81   Pulse (!) 58   Temp 98.9 F (37.2 C) (Oral)   Resp 14   SpO2 98%   Physical Exam Vitals and nursing note reviewed.  Constitutional:      General: She is not in acute distress.    Appearance: She is well-developed. She is not diaphoretic.  HENT:     Head: Normocephalic and atraumatic.  Eyes:     Pupils: Pupils are equal, round, and reactive to light.  Cardiovascular:     Rate and Rhythm: Normal rate and regular rhythm.     Heart sounds: No murmur heard.   No friction rub.  No gallop.  Pulmonary:     Effort: Pulmonary effort is normal.     Breath sounds: No wheezing or rales.     Comments: Pain with palpation of the anterior chest wall worst about the left sternal border about ribs 4 through 6 reproduces her symptoms. Chest:     Chest wall: Tenderness present.  Abdominal:     General: There is no distension.     Palpations: Abdomen is soft.     Tenderness: There is abdominal tenderness.     Comments: Mild epigastric tenderness, negative Murphy sign.  Musculoskeletal:        General: No tenderness.     Cervical back: Normal range of motion and neck supple.  Skin:    General: Skin is warm and dry.  Neurological:     Mental Status: She is alert and oriented to person, place, and time.  Psychiatric:        Behavior: Behavior normal.    ED Results / Procedures / Treatments   Labs (all labs ordered are listed, but only abnormal results are displayed) Labs Reviewed  BASIC METABOLIC PANEL - Abnormal; Notable for the following components:      Result Value   Potassium 3.2 (*)    All other components within normal limits  HEPATIC FUNCTION PANEL - Abnormal; Notable for the following components:   Total Bilirubin 1.7 (*)    Bilirubin, Direct 0.5 (*)    Indirect Bilirubin 1.2 (*)    All other components within normal limits  CBC  LIPASE, BLOOD  I-STAT BETA HCG BLOOD, ED ([facility], [facility], [facility] ONLY)  TROPONIN I (HIGH SENSITIVITY)  TROPONIN I (HIGH SENSITIVITY)    EKG EKG Interpretation  Date/Time:  Saturday January 30 2021 11:45:04 EDT Ventricular Rate:  68 PR Interval:  130 QRS Duration: 70 QT Interval:  384 QTC Calculation: 408 R Axis:   -  15 Text Interpretation: Normal sinus rhythm Normal ECG No significant change since last tracing Confirmed by Deno Etienne [phone]) on 01/30/2021 12:00:28 PM  Radiology DG Chest 2 View  Result Date: 01/30/2021 CLINICAL DATA:  Chest pain. Shortness of breath and dizziness. Smoker. EXAM: CHEST - 2 VIEW COMPARISON:  Chest x-ray  dated 06/26/2020. FINDINGS: Heart size and mediastinal contours are within normal limits. Lungs are hyperexpanded. Chronic bronchitic changes noted centrally. Coarse lung markings are seen bilaterally suggesting chronic interstitial lung disease. No confluent opacity to suggest a superimposed pneumonia. No pleural effusion or pneumothorax is seen. Osseous structures about the chest are unremarkable. IMPRESSION: 1. No active cardiopulmonary disease. No evidence of pneumonia or pulmonary edema. 2. 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 Electronically Signed   By: Franki Cabot M.D.   On: 01/30/2021 12:16    Procedures Procedures   Medications Ordered in ED Medications  aspirin chewable tablet 324 mg (324 mg Oral Given 01/30/21 1340)  alum & mag hydroxide-simeth (MAALOX/MYLANTA) 200-200-20 MG/5ML suspension 30 mL (30 mLs Oral Given 01/30/21 1342)    ED Course  I have reviewed the triage vital signs and the nursing notes.  Pertinent labs & imaging results that were available during my care of the patient were reviewed by me and considered in my medical decision making (see chart for details).    MDM Rules/Calculators/A&P                          50 yo F with a chief complaint of chest pain.  Atypical in nature and reproduced on exam.  Likely muscular, as its been coming and going we will obtain a delta troponin.  LFTs with some mild epigastric tenderness.  Reassess. Chest x-ray viewed by me without focal infiltrate or pneumothorax.  Troponin negative.  No significant anemia no significant electrolyte abnormality.  I feel completely atypical of PE.  Delta negative.  Discharge home.  3:30 PM:  I have discussed the diagnosis/risks/treatment options with the patient and believe the pt to be eligible for discharge home to follow-up with PCP. We also discussed returning to the ED immediately if new or worsening sx occur. We discussed the sx which are most  concerning (e.g., sudden worsening pain, fever, inability to tolerate by mouth) that necessitate immediate return. Medications administered to the patient during their visit and any new prescriptions provided to the patient are listed below.  Medications given during this visit Medications  aspirin chewable tablet 324 mg (324 mg Oral Given 01/30/21 1340)  alum & mag hydroxide-simeth (MAALOX/MYLANTA) 200-200-20 MG/5ML suspension 30 mL (30 mLs Oral Given 01/30/21 1342)     The patient appears reasonably screen and/or stabilized for discharge and I doubt any other medical condition or other [facility] requiring further screening, evaluation, or treatment in the ED at this time prior to discharge.   Final Clinical Impression(s) / ED Diagnoses Final diagnoses:  Nonspecific chest pain    Rx / DC Orders ED Discharge Orders     None        Deno Etienne, DO 01/30/21 1530

## 2021-01-30 NOTE — ED Notes (Signed)
RN called pt's mother to update her

## 2021-01-30 NOTE — ED Notes (Signed)
RN reviewed discharge instructions w/ pt. Follow up and pain management reviewed, pt had no further questions. 

## 2021-01-30 NOTE — Telephone Encounter (Signed)
**  AFTER HOURS EMERGENCY LINE CALL**   Received page regarding after-hours call for patient with birthday [birth year] and phone number [phone].  Chart review indicates this patient is Annette Chang, 50 year old female.  11:00 am: Phone rang without answer and without option for voicemail.   11:05 am: Received additional after hours page. Number provided [phone]. Called for a fourth time. Answered this time.   Patient initially asks for acid reflux pills for relief to be sent to her pharmacy. Upon further questioning, she reports 2 days of discomfort, pain in stomach and center ribs. Feels like she cannot breathe. Pain "was moving around earlier but is in the center now". Reports radiation to right arm earlier. Some sweating, but reports she has hot flashes. Uncomfortable. Has tried taking tums and alka seltzer without relief. She has no history of acid reflux and is not on any acid reflux medicine. She has not experienced this before. No recent medication changes. When asked if she has a personal history of heart attack or stroke, she replies "No. But that's what that feels like...it feels like a heart attack."  Recommended patient proceed to the nearest emergency room for evaluation.   Patient denies dizziness, pre-syncope, headache, and nausea.   Ezequiel Essex, MD

## 2021-02-01 ENCOUNTER — Telehealth: Payer: Self-pay

## 2021-02-01 DIAGNOSIS — M810 Age-related osteoporosis without current pathological fracture: Secondary | ICD-10-CM | POA: Diagnosis not present

## 2021-02-01 NOTE — Telephone Encounter (Signed)
Transition Care Management Unsuccessful Follow-up Telephone Call  Date of discharge and from where:  01/30/2021-[facility]   Attempts:  1st Attempt  Reason for unsuccessful TCM follow-up call:  Left voice message

## 2021-02-02 DIAGNOSIS — M810 Age-related osteoporosis without current pathological fracture: Secondary | ICD-10-CM | POA: Diagnosis not present

## 2021-02-02 NOTE — Telephone Encounter (Signed)
Transition Care Management Follow-up Telephone Call Date of discharge and from where: 01/30/2021- [facility] How have you been since you were released from the hospital? Feeling Fine  Any questions or concerns? No  Items Reviewed: Did the pt receive and understand the discharge instructions provided? Yes  Medications obtained and verified? Yes  Other? No  Any new allergies since your discharge? No  Dietary orders reviewed? N/A Do you have support at home? Yes   Home Care and Equipment/Supplies: Were home health services ordered? not applicable If so, what is the name of the agency? N/A  Has the agency set up a time to come to the patient's home? not applicable Were any new equipment or medical supplies ordered?  No What is the name of the medical supply agency? N/A Were you able to get the supplies/equipment? not applicable Do you have any questions related to the use of the equipment or supplies? No  Functional Questionnaire: (I = Independent and D = Dependent) ADLs: I  Bathing/Dressing- I  Meal Prep- I  Eating- I  Maintaining continence- I  Transferring/Ambulation- I  Managing Meds- I  Follow up appointments reviewed:  PCP Hospital f/u appt confirmed? Yes  Scheduled to see Dr. Vanessa Calcasieu on 02/03/2021 @ 10:50 am. [facility] f/u appt confirmed? No   Are transportation arrangements needed? No  If their condition worsens, is the pt aware to call PCP or go to the Emergency Dept.? Yes Was the patient provided with contact information for the PCP's office or ED? Yes Was to pt encouraged to call back with questions or concerns? Yes

## 2021-02-02 NOTE — Telephone Encounter (Signed)
Transition Care Management Unsuccessful Follow-up Telephone Call  Date of discharge and from where:  79/2022[facility]  Attempts:  2nd Attempt  Reason for unsuccessful TCM follow-up call:  Left voice message

## 2021-02-03 ENCOUNTER — Other Ambulatory Visit: Payer: Self-pay

## 2021-02-03 ENCOUNTER — Ambulatory Visit: Payer: Medicaid Other | Admitting: Family Medicine

## 2021-02-03 DIAGNOSIS — M810 Age-related osteoporosis without current pathological fracture: Secondary | ICD-10-CM | POA: Diagnosis not present

## 2021-02-03 DIAGNOSIS — K2100 Gastro-esophageal reflux disease with esophagitis, without bleeding: Secondary | ICD-10-CM | POA: Diagnosis not present

## 2021-02-03 MED ORDER — OMEPRAZOLE 20 MG PO CPDR: 20.0000 mg | DELAYED_RELEASE_CAPSULE | Freq: Every day | ORAL | 3 refills | Status: DC

## 2021-02-03 NOTE — Progress Notes (Signed)
    SUBJECTIVE:   CHIEF COMPLAINT / HPI:   Reflux Patient reports issues with reflux for the past 2 weeks.  She was seen and evaluated in the emergency department over the weekend and they gave her Pepcid with little to no relief.  They also told her to take Maalox as well as a laxative.  She reports that none of this medication has helped with the reflux.  The reflux happens after she eats is and is worse when she lays down.  She feels a burning in her throat and tasting acid.  Has not tried any other medications.  Denies any chest pain, shortness of breath, radiation of pain.  OBJECTIVE:   BP 124/78   Pulse 73   Wt 88 lb 6.4 oz (40.1 kg)   SpO2 99%   BMI 16.70 kg/m   General: Well-appearing 50 year old female, no acute distress Respiratory: Normal work of breathing, lungs clear to auscultation bilaterally Cardiac: Regular rate and rhythm, no murmurs appreciated Abdomen: Patient has epigastric tenderness but otherwise no tenderness to palpation.  Positive bowel sounds   ASSESSMENT/PLAN:   Reflux esophagitis Patient reports signs and symptoms of gastric reflux.  Symptoms have persisted for 2 weeks.  Tried Pepcid without relief.  No signs concerning for cardiac etiology for this.  Denies any chest pain, shortness of breath.  I have prescribed patient prescription for omeprazole 20 mg daily for 2 weeks.  Strict ED and return precautions given and patient is agreeable to this.  No further questions or concerns.  If symptoms do not resolve patient may require referral for GI for further evaluation and patient is agreeable to this.     Gifford Shave, MD [place]

## 2021-02-03 NOTE — Assessment & Plan Note (Addendum)
Patient reports signs and symptoms of gastric reflux.  Symptoms have persisted for 2 weeks.  Tried Pepcid without relief.  No signs concerning for cardiac etiology for this.  Denies any chest pain, shortness of breath.  I have prescribed patient prescription for omeprazole 20 mg daily for 2 weeks.  Strict ED and return precautions given and patient is agreeable to this.  No further questions or concerns.  If symptoms do not resolve patient may require referral for GI for further evaluation and patient is agreeable to this.

## 2021-02-03 NOTE — Patient Instructions (Signed)
It was great seeing you today.  For your reflux I am prescribing a medication called omeprazole.  You will take 1 tablet (20 mg) daily for 2 weeks.  Also with your alendronate please take it with a full glass of water and sit upright for 30 minutes after taking the medication because this can cause reflux symptoms.  If you have worsening symptoms please let us know.  If the symptoms do not resolve let us know and we can send a referral in for GI so they can further evaluate you.  I hope you have a wonderful afternoon!

## 2021-02-08 DIAGNOSIS — M810 Age-related osteoporosis without current pathological fracture: Secondary | ICD-10-CM | POA: Diagnosis not present

## 2021-02-09 DIAGNOSIS — M810 Age-related osteoporosis without current pathological fracture: Secondary | ICD-10-CM | POA: Diagnosis not present

## 2021-02-10 DIAGNOSIS — M810 Age-related osteoporosis without current pathological fracture: Secondary | ICD-10-CM | POA: Diagnosis not present

## 2021-02-15 DIAGNOSIS — M810 Age-related osteoporosis without current pathological fracture: Secondary | ICD-10-CM | POA: Diagnosis not present

## 2021-02-16 DIAGNOSIS — M810 Age-related osteoporosis without current pathological fracture: Secondary | ICD-10-CM | POA: Diagnosis not present

## 2021-02-17 ENCOUNTER — Ambulatory Visit: Payer: Medicaid Other | Admitting: Orthopedic Surgery

## 2021-02-17 DIAGNOSIS — M810 Age-related osteoporosis without current pathological fracture: Secondary | ICD-10-CM | POA: Diagnosis not present

## 2021-02-22 DIAGNOSIS — M810 Age-related osteoporosis without current pathological fracture: Secondary | ICD-10-CM | POA: Diagnosis not present

## 2021-02-23 DIAGNOSIS — M810 Age-related osteoporosis without current pathological fracture: Secondary | ICD-10-CM | POA: Diagnosis not present

## 2021-02-24 DIAGNOSIS — M810 Age-related osteoporosis without current pathological fracture: Secondary | ICD-10-CM | POA: Diagnosis not present

## 2021-03-01 DIAGNOSIS — M810 Age-related osteoporosis without current pathological fracture: Secondary | ICD-10-CM | POA: Diagnosis not present

## 2021-03-02 DIAGNOSIS — M810 Age-related osteoporosis without current pathological fracture: Secondary | ICD-10-CM | POA: Diagnosis not present

## 2021-03-03 DIAGNOSIS — M810 Age-related osteoporosis without current pathological fracture: Secondary | ICD-10-CM | POA: Diagnosis not present

## 2021-03-08 DIAGNOSIS — M810 Age-related osteoporosis without current pathological fracture: Secondary | ICD-10-CM | POA: Diagnosis not present

## 2021-03-09 DIAGNOSIS — M810 Age-related osteoporosis without current pathological fracture: Secondary | ICD-10-CM | POA: Diagnosis not present

## 2021-03-10 DIAGNOSIS — M810 Age-related osteoporosis without current pathological fracture: Secondary | ICD-10-CM | POA: Diagnosis not present

## 2021-04-26 ENCOUNTER — Ambulatory Visit: Payer: Medicaid Other | Admitting: Orthopedic Surgery

## 2021-04-28 NOTE — Progress Notes (Deleted)
    SUBJECTIVE:   CHIEF COMPLAINT / HPI:   Leg pain: 50 year old female presenting for leg pain.  She states***.  PERTINENT  PMH / PSH: ***  OBJECTIVE:   There were no vitals taken for this visit. ***  General: NAD, pleasant, able to participate in exam Cardiac: RRR, no murmurs. Respiratory: CTAB, normal effort, No wheezes, rales or rhonchi Abdomen: Bowel sounds present, nontender, nondistended, no hepatosplenomegaly. Extremities: no edema or cyanosis. Skin: warm and dry, no rashes noted Neuro: alert, no obvious focal deficits Psych: Normal affect and mood  ASSESSMENT/PLAN:   No problem-specific Assessment & Plan notes found for this encounter.     [name], [place]    {    This will disappear when note is signed, click to select method of visit    :1}

## 2021-04-30 ENCOUNTER — Ambulatory Visit: Payer: Medicaid Other | Admitting: Family Medicine

## 2021-05-05 ENCOUNTER — Ambulatory Visit: Payer: Medicaid Other

## 2021-05-16 ENCOUNTER — Other Ambulatory Visit: Payer: Self-pay | Admitting: Family Medicine

## 2021-05-16 DIAGNOSIS — M810 Age-related osteoporosis without current pathological fracture: Secondary | ICD-10-CM

## 2021-05-17 ENCOUNTER — Other Ambulatory Visit: Payer: Self-pay | Admitting: Family Medicine

## 2021-05-17 DIAGNOSIS — M810 Age-related osteoporosis without current pathological fracture: Secondary | ICD-10-CM

## 2021-05-18 DIAGNOSIS — I10 Essential (primary) hypertension: Secondary | ICD-10-CM | POA: Diagnosis not present

## 2021-05-19 ENCOUNTER — Telehealth: Payer: Self-pay

## 2021-05-19 ENCOUNTER — Other Ambulatory Visit: Payer: Self-pay | Admitting: Surgical

## 2021-05-19 DIAGNOSIS — I10 Essential (primary) hypertension: Secondary | ICD-10-CM | POA: Diagnosis not present

## 2021-05-19 MED ORDER — METHOCARBAMOL 500 MG PO TABS: 500.0000 mg | ORAL_TABLET | Freq: Three times a day (TID) | ORAL | 0 refills | Status: DC | PRN

## 2021-05-19 NOTE — Telephone Encounter (Signed)
Pt called the Pharm and would like a refill on her Methocarbamol.  The address for the pharmacy is below   [facility]  Fax # [phone]

## 2021-05-19 NOTE — Telephone Encounter (Signed)
Sent in

## 2021-05-20 ENCOUNTER — Encounter: Payer: Self-pay | Admitting: Orthopedic Surgery

## 2021-05-20 ENCOUNTER — Ambulatory Visit (INDEPENDENT_AMBULATORY_CARE_PROVIDER_SITE_OTHER): Payer: Medicaid Other | Admitting: Orthopedic Surgery

## 2021-05-20 ENCOUNTER — Other Ambulatory Visit: Payer: Self-pay

## 2021-05-20 DIAGNOSIS — M25511 Pain in right shoulder: Secondary | ICD-10-CM

## 2021-05-20 DIAGNOSIS — Z96612 Presence of left artificial shoulder joint: Secondary | ICD-10-CM

## 2021-05-20 DIAGNOSIS — M25512 Pain in left shoulder: Secondary | ICD-10-CM | POA: Diagnosis not present

## 2021-05-20 NOTE — Telephone Encounter (Signed)
Contacted patient to make her aware that medication has been sent into pharmacy.

## 2021-05-20 NOTE — Progress Notes (Signed)
Office Visit Note   Patient: Annette Chang           Date of Birth: 1971/04/26           MRN: [id number] Visit Date: 05/20/2021 Requested by: [name], DO [place],  [place] [address] PCP: [name], DO  Subjective: Chief Complaint  Patient presents with   Right Shoulder - Pain   Left Shoulder - Pain    HPI: Patient presents for evaluation of left shoulder.  She has had right shoulder surgery x2 for scapular winging which was unsuccessful.  She is living with her right shoulder.  In general she is a very thin person may have an underlying amount of malnutrition present.  She is having some left shoulder pain as well.  She wants to make sure her left shoulder does not get into the same predicament that her right shoulder got into.  She did have scapular winging for several years prior to our attempts at correction.  She reports some weakness in the left shoulder.              ROS: All systems reviewed are negative as they relate to the chief complaint within the history of present illness.  Patient denies  fevers or chills.   Assessment & Plan: Visit Diagnoses:  1. Presence of left artificial shoulder joint   2. Bilateral shoulder pain, unspecified chronicity     Plan: Impression is left shoulder pain which may be rotator cuff pathology.  Scapula is prominent but whether or not it is winging is difficult to say.  She does have significant diminished body mass index.  Plan is MRI arthrogram left shoulder to evaluate rotator cuff tear.  Shoulder has been hurting now for well over 6 months but no history of trauma.  Also need EMG nerve study left upper extremity to evaluate scapular winging particularly the long thoracic nerve dysfunction.  Follow-up after that study.  Follow-Up Instructions: Return for after MRI.   Orders:  Orders Placed This Encounter  Procedures   MR Shoulder Left w/ contrast   Arthrogram   Ambulatory referral to Physical Medicine Rehab    No orders of the defined types were placed in this encounter.     Procedures: No procedures performed   Clinical Data: No additional findings.  Objective: Vital Signs: There were no vitals taken for this visit.  Physical Exam:   Constitutional: Patient appears well-developed HEENT:  Head: Normocephalic Eyes:EOM are normal Neck: Normal range of motion Cardiovascular: Normal rate Pulmonary/chest: Effort normal Neurologic: Patient is alert Skin: Skin is warm Psychiatric: Patient has normal mood and affect   Ortho Exam: Ortho exam demonstrates on that left-hand side some coarse grinding and crepitus with active and passive motion at 90 degrees of abduction.  Scapula does not have any rotational abnormality.  She does have weakness to infraspinatus supraspinatus testing on the left.  Motor sensory function to the hand is intact.  Neck range of motion intact.  Specialty Comments:  No specialty comments available.  Imaging: No results found.   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   Chronic urticaria 08/06/2015   Winged scapula of right side 10/06/2014   Right shoulder pain 08/22/2014   H/O rotator cuff surgery 01/02/2012   Arthritis of shoulder region, 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   Iron deficiency anemia 01/02/2012   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   Blood transfusion without reported diagnosis    Depression    H/O rotator cuff surgery 01/02/2012   H/O tubal ligation 01/02/2012   H/O:  C-section 01/02/2012   3    H/O: C-section 01/02/2012   3    Headache(784.0) 01/02/2012   Iron deficiency anemia 01/02/2012   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    Family History  Problem Relation Age of Onset   Diabetes Maternal Grandmother    Diabetes Paternal Grandmother    Diabetes Paternal Grandfather    Colon cancer Neg Hx    Esophageal cancer Neg Hx    Rectal cancer Neg Hx    Stomach cancer Neg Hx     Past Surgical History:  Procedure Laterality Date   CESAREAN SECTION     3 previous c sections   MUSCLE REPAIR Right 01/10/2017   RIGHT PECTORAL MAJOR TO SCAPULA MUSCLE TRANSFER (Right   NOVASURE ABLATION N/A 03/11/2014   Procedure: NOVASURE ABLATION;  Surgeon: Emily Filbert, MD;  Location: [facility];  Service: Gynecology;  Laterality: N/A;   PECTORALIS TENDON REPAIR Right 01/10/2017   Procedure: RIGHT PECTORAL MAJOR TO SCAPULA MUSCLE TRANSFER;  Surgeon: Meredith Pel, MD;  Location: [facility];  Service: Orthopedics;  Laterality: Right;   PECTORALIS TENDON REPAIR Right 12/28/2017   Procedure: RIGHT REVISION TENDON TRANSFER OF PECTORALIS MAJOR;  Surgeon: Meredith Pel, MD;  Location: [facility];  Service: Orthopedics;  Laterality: Right;   rotator cuff surgery     SHOULDER SURGERY     TUBAL LIGATION     Social History   Occupational History   Occupation: unemployed  Tobacco Use   Smoking status: Former    Years: 5.00    Types: Cigarettes    Quit date: 11/2017    Years since quitting: 3.4   Smokeless tobacco: Never  Vaping Use   Vaping Use: Never used  Substance and Sexual Activity   Alcohol use: Yes    Alcohol/week: 14.0 standard drinks    Types: 14 Glasses of wine per week    Comment: weekends   Drug use: Yes    Comment: marijuana used 3 times a weekly   Sexual activity: Not Currently    Birth control/protection: Surgical

## 2021-05-24 DIAGNOSIS — I10 Essential (primary) hypertension: Secondary | ICD-10-CM | POA: Diagnosis not present

## 2021-05-26 DIAGNOSIS — I10 Essential (primary) hypertension: Secondary | ICD-10-CM | POA: Diagnosis not present

## 2021-05-28 DIAGNOSIS — I10 Essential (primary) hypertension: Secondary | ICD-10-CM | POA: Diagnosis not present

## 2021-06-01 ENCOUNTER — Telehealth: Payer: Self-pay | Admitting: Orthopedic Surgery

## 2021-06-01 DIAGNOSIS — I10 Essential (primary) hypertension: Secondary | ICD-10-CM | POA: Diagnosis not present

## 2021-06-01 NOTE — Telephone Encounter (Signed)
Called pt 2X and left vm for pt to call and set an appt with Dr. Marlou Sa after 11/17 for MRI review

## 2021-06-01 NOTE — Progress Notes (Addendum)
    SUBJECTIVE:   CHIEF COMPLAINT / HPI:   Elevated bilirubin: 50 year old female with noted elevated bilirubin during an ED visit on 01/30/2021.  Her total bilirubin was elevated 1.7 with direct mildly elevated 0.5 and indirect elevated mildly at 1.2.  Plan to recheck bilirubin today.  Right middle finger pain: For 2 weeks she has had difficulty moving her right middle finger particularly when she wakes up. She smashed it in a door a few weeks before that happened.   Current smoker 1 pack every 2 weeks, has been smoking for 20 years. She is not interested in trying to cut down at this time.   Fingernail abnormality: Patient states that she never heard from dermatology for the pitting of her fingernails.  She was referred several months ago and per the referral documentation looks like she had an appointment back in July but I am unsure of the situation regarding this.  Patient request to repeat referral.  PERTINENT  PMH / PSH: Current smoker  OBJECTIVE:   BP 104/84   Pulse 73   Ht 5\' 1"  (1.549 m)   Wt 91 lb 9.6 oz (41.5 kg)   SpO2 100%   BMI 17.31 kg/m    General: NAD, pleasant, able to participate in exam Cardiac: RRR, no murmurs. Respiratory: CTAB, normal effort, No wheezes, rales or rhonchi Abdomen: Bowel sounds present, nontender, nondistended, no hepatosplenomegaly. Extremities: no edema or cyanosis. MSK: She is able to move each joint of her right middle finger. Minor swelling at the PIP of the right middle finger. No clicking present on movement.  Neuro: alert, no obvious focal deficits Psych: Normal affect and mood  ASSESSMENT/PLAN:    Elevated bilirubin: Patient was previously elevated bilirubin on ED labs on with total at 1.7, direct 0.5, indirect at 1.2.  No history of gallbladder or other GI etiologies.  Previous bilirubin has been normal.  No jaundice or pain with palpation of the abdomen on physical exam today.  We will plan to recheck today.  Screen for type  2 diabetes: Patient request screening with an A1c.  We will perform this today.  Current smoker: Smoking 1 pack about every 2 weeks.  She is been smoking for about 20 years.  She is not currently interested in patches or gums.  We will continue to address smoking at future appointments.  Right middle finger pain: Has been going on for 2 to 3 weeks after smashing her finger in the door.  She has good mobility of all joints in all directions of the middle finger.  She does have some swelling at the PIP.  Low concern for a avulsion as she has functional mobility of these joints.  Most likely that the catching that she is experiencing in the morning is due to trigger finger but will get x-ray due to the trauma to evaluate for any avulsions or other concerns.  If these are negative or do not show anything we need to do anything about can get her for an injection for trigger finger.  Fingernail abnormality: Will place repeat referral for dermatology as patient never got an appointment.   [name], [place]

## 2021-06-02 ENCOUNTER — Ambulatory Visit (INDEPENDENT_AMBULATORY_CARE_PROVIDER_SITE_OTHER): Payer: Medicaid Other | Admitting: Family Medicine

## 2021-06-02 ENCOUNTER — Ambulatory Visit (INDEPENDENT_AMBULATORY_CARE_PROVIDER_SITE_OTHER): Payer: Medicaid Other

## 2021-06-02 ENCOUNTER — Ambulatory Visit (HOSPITAL_COMMUNITY)
Admission: RE | Admit: 2021-06-02 | Discharge: 2021-06-02 | Disposition: A | Discharge status: Home / Self Care | Payer: Medicaid Other | Source: Ambulatory Visit | Attending: Family Medicine | Admitting: Family Medicine

## 2021-06-02 ENCOUNTER — Other Ambulatory Visit: Payer: Self-pay

## 2021-06-02 ENCOUNTER — Encounter: Payer: Self-pay | Admitting: Family Medicine

## 2021-06-02 DIAGNOSIS — Z23 Encounter for immunization: Secondary | ICD-10-CM

## 2021-06-02 DIAGNOSIS — L609 Nail disorder, unspecified: Secondary | ICD-10-CM

## 2021-06-02 DIAGNOSIS — Z131 Encounter for screening for diabetes mellitus: Secondary | ICD-10-CM | POA: Diagnosis not present

## 2021-06-02 DIAGNOSIS — S6991XA Unspecified injury of right wrist, hand and finger(s), initial encounter: Secondary | ICD-10-CM | POA: Diagnosis not present

## 2021-06-02 DIAGNOSIS — M79644 Pain in right finger(s): Secondary | ICD-10-CM | POA: Diagnosis not present

## 2021-06-02 DIAGNOSIS — I10 Essential (primary) hypertension: Secondary | ICD-10-CM | POA: Diagnosis not present

## 2021-06-02 NOTE — Patient Instructions (Signed)
We are going to recheck some labs today after your ER visit.  I will call you with the results.  We will also screen for type 2 diabetes as you requested.  Also to consider having a follow-up appointment to talk about cutting down on smoking.  For your finger pain we are going to get an x-ray.  You can walk across the street in the main entrance of [facility] to get this x-ray.  I will call you with the results.

## 2021-06-03 LAB — HEPATIC FUNCTION PANEL
ALT: 30 IU/L (ref 0–32)
AST: 41 IU/L — ABNORMAL HIGH (ref 0–40)
Albumin: 4.4 g/dL (ref 3.8–4.9)
Alkaline Phosphatase: 88 IU/L (ref 44–121)
Bilirubin Total: 0.4 mg/dL (ref 0.0–1.2)
Bilirubin, Direct: 0.13 mg/dL (ref 0.00–0.40)
Total Protein: 7.2 g/dL (ref 6.0–8.5)

## 2021-06-03 LAB — HEMOGLOBIN A1C
Est. average glucose Bld gHb Est-mCnc: 103 mg/dL
Hgb A1c MFr Bld: 5.2 % (ref 4.8–5.6)

## 2021-06-04 ENCOUNTER — Other Ambulatory Visit: Payer: Self-pay

## 2021-06-04 ENCOUNTER — Other Ambulatory Visit: Payer: Self-pay | Admitting: Family Medicine

## 2021-06-04 ENCOUNTER — Ambulatory Visit (INDEPENDENT_AMBULATORY_CARE_PROVIDER_SITE_OTHER): Payer: Medicaid Other | Admitting: Physical Medicine and Rehabilitation

## 2021-06-04 ENCOUNTER — Encounter: Payer: Self-pay | Admitting: Physical Medicine and Rehabilitation

## 2021-06-04 DIAGNOSIS — M79644 Pain in right finger(s): Secondary | ICD-10-CM

## 2021-06-04 DIAGNOSIS — R202 Paresthesia of skin: Secondary | ICD-10-CM | POA: Diagnosis not present

## 2021-06-04 NOTE — Progress Notes (Signed)
Bilateral shoulder pain. Pain radiates to fingers. Right hand dominant but has to use left arm more due to pain and history of surgery on right.  No lotion per patient

## 2021-06-08 NOTE — Progress Notes (Signed)
Annette Chang - 50 y.o. female MRN [id number]  Date of birth: 1971/04/30  Office Visit Note: Visit Date: 06/04/2021 PCP: [name], DO Referred by: [name], DO  Subjective: Chief Complaint  Patient presents with   Right Shoulder - Pain   Left Shoulder - Pain   HPI:  Annette Chang is a 50 y.o. female who comes in today at the request of Dr. Anderson Malta for electrodiagnostic study of the Left upper extremities.  Patient is Right hand dominant.  Patient reports a chronic history of bilateral shoulder pain with some pain radiating to the fingers without specific numbness or tingling.  She has a history of shoulder and scapular surgery by Dr. Marlou Sa for long thoracic nerve lesion on the right.  Her history is that in 2016 she had electrodiagnostic study of the right upper limb by Dr. Floyde Parkins at [facility] which showed needle EMG findings specifically of the serratus anterior on the right.  Surgery on the right has not been very successful from a pain relief standpoint according to the patient.  The left shoulder starting to feel the same way and over the last several months has had increasing amounts of pain.  Dr. Marlou Sa felt that there was prominence of the left scapula although she is a very thin individual is hard to tell if it was weighing or not.  On my exam today it is hard to see as well if there is pure winging.  I have actually completed a left upper extremity electrodiagnostic study in 2020 that was normal.  This is reviewed below.  That did not specifically address the long thoracic nerve or needle EMG of the parascapular muscles.  ROS Otherwise per HPI.  Assessment & Plan: Visit Diagnoses:    ICD-10-CM   1. Paresthesia of skin  R20.2 NCV with EMG (electromyography)      Plan: Impression: Essentially NORMAL electrodiagnostic study of the left upper limb.  There is no significant electrodiagnostic evidence of nerve entrapment, brachial plexopathy  or cervical radiculopathy.  Specifically no evidence of axonal or demyelinating long thoracic nerve neuropathy although this particular testing is fraught with technical difficulties.  No differences when compared to the left upper extremity study of 2020 although again that study did not specifically look at the long thoracic nerve.   As you know, purely sensory or demyelinating radiculopathies and chemical radiculitis may not be detected with this particular electrodiagnostic study.  Recommendations: 1.  Follow-up with referring physician. 2.  Continue current management of symptoms.  Meds & Orders: No orders of the defined types were placed in this encounter.   Orders Placed This Encounter  Procedures   NCV with EMG (electromyography)    Follow-up: Return for  G. Alphonzo Severance, MD.   Procedures: No procedures performed  EMG & NCV Findings: All nerve conduction studies (as indicated in the following tables) were within normal limits.    All examined muscles (as indicated in the following table) showed no evidence of electrical instability.    Impression: Essentially NORMAL electrodiagnostic study of the left upper limb.  There is no significant electrodiagnostic evidence of nerve entrapment, brachial plexopathy or cervical radiculopathy.  Specifically no evidence of axonal or demyelinating long thoracic nerve neuropathy although this particular testing is fraught with technical difficulties.  No differences when compared to the left upper extremity study of 2020 although again that study did not specifically look at the long thoracic nerve.   As you know,  purely sensory or demyelinating radiculopathies and chemical radiculitis may not be detected with this particular electrodiagnostic study.  Recommendations: 1.  Follow-up with referring physician. 2.  Continue current management of symptoms.  ___________________________ Laurence Spates FAAPMR Board Certified, American Board of  Physical Medicine and Rehabilitation    Nerve Conduction Studies Anti Sensory Summary Table   Stim Site NR Peak (ms) Norm Peak (ms) P-T Amp (V) Norm P-T Amp Site1 Site2 Delta-P (ms) Dist (cm) Vel (m/s) Norm Vel (m/s)  Left Median Acr Palm Anti Sensory (2nd Digit)  31.4C  Wrist    3.2 <3.6 27.2 >10 Wrist Palm 1.5 0.0    Palm    1.7 <2.0 26.8         Left Radial Anti Sensory (Base 1st Digit)  31.1C  Wrist    2.2 <3.1 30.6  Wrist Base 1st Digit 2.2 0.0    Left Ulnar Anti Sensory (5th Digit)  31.5C  Wrist    3.1 <3.7 40.7 >15.0 Wrist 5th Digit 3.1 14.0 45 >38   Motor Summary Table   Stim Site NR Onset (ms) Norm Onset (ms) O-P Amp (mV) Norm O-P Amp Site1 Site2 Delta-0 (ms) Dist (cm) Vel (m/s) Norm Vel (m/s)  Left Long Thoracic. Motor (serratus anterior )  31C  axilla    3.4  0.7  axilla serratus anterior  3.4 11.0 32   Left Median Motor (Abd Poll Brev)  30.6C  Wrist    3.0 <4.2 6.2 >5 Elbow Wrist 3.3 20.5 62 >50  Elbow    6.3  7.6         Left Ulnar Motor (Abd Dig Min)  31C  Wrist    2.6 <4.2 9.6 >3 B Elbow Wrist 2.9 18.5 64 >53  B Elbow    5.5  8.0  A Elbow B Elbow 1.4 9.0 64 >53  A Elbow    6.9  8.0          EMG   Side Muscle Nerve Root Ins Act Fibs Psw Amp Dur Poly Recrt Int Fraser Din Comment  Left Deltoid Axillary C5-6 Nml Nml Nml Nml Nml 0 Nml Nml   Left Supraspinatus SupraScap C5-6 Nml Nml Nml Nml Nml 0 Nml Nml   Left Teres Minor Axillary C5-6 Nml Nml Nml Nml Nml 0 Nml Nml   Left SerratAnt LongThor C5-7 Nml Nml Nml Nml Nml 0 Nml Nml     Nerve Conduction Studies Anti Sensory Left/Right Comparison   Stim Site L Lat (ms) R Lat (ms) L-R Lat (ms) L Amp (V) R Amp (V) L-R Amp (%) Site1 Site2 L Vel (m/s) R Vel (m/s) L-R Vel (m/s)  Median Acr Palm Anti Sensory (2nd Digit)  31.4C  Wrist 3.2   27.2   Wrist Palm     Palm 1.7   26.8         Radial Anti Sensory (Base 1st Digit)  31.1C  Wrist 2.2   30.6   Wrist Base 1st Digit     Ulnar Anti Sensory (5th Digit)  31.5C  Wrist  3.1   40.7   Wrist 5th Digit 45     Motor Left/Right Comparison   Stim Site L Lat (ms) R Lat (ms) L-R Lat (ms) L Amp (mV) R Amp (mV) L-R Amp (%) Site1 Site2 L Vel (m/s) R Vel (m/s) L-R Vel (m/s)  Long Thoracic. Motor (serratus anterior )  31C  axilla 3.4   0.7   axilla serratus anterior  32  Median Motor (Abd Poll Brev)  30.6C  Wrist 3.0   6.2   Elbow Wrist 62    Elbow 6.3   7.6         Ulnar Motor (Abd Dig Min)  31C  Wrist 2.6   9.6   B Elbow Wrist 64    B Elbow 5.5   8.0   A Elbow B Elbow 64    A Elbow 6.9   8.0            Waveforms:             Clinical History: 11/2018 EMG/NCS Impression: Essentially NORMAL electrodiagnostic study of the left upper limb.  There is no significant electrodiagnostic evidence of nerve entrapment, brachial plexopathy or cervical radiculopathy.     As you know, purely sensory or demyelinating radiculopathies and chemical radiculitis may not be detected with this particular electrodiagnostic study.   Also this would not detect a purely demyelinated specific brachial plexus issue.   Recommendations: 1.  Follow-up with referring physician. 2.  Continue current management of symptoms.   ___________________________ Wonda Olds Board Certified, American Board of Physical Medicine and Rehabilitation ----------------------  EMG/NCS IMPRESSION:   Nerve conduction studies done on the right upper extremity were unremarkable, without evidence of a peripheral neuropathy. 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 No evidence of a cervical radiculopathy was seen.   Jill Alexanders MD 11/06/2014 11:09 AM   [facility]     Objective:  VS:  HT:    WT:   BMI:     BP:   HR: bpm  TEMP: ( )  RESP:  Physical Exam Constitutional:      Comments: Very thin individual   Musculoskeletal:        General: No swelling, tenderness or deformity.     Comments: Inspection reveals no atrophy of the bilateral APB or FDI or hand intrinsics. There is no swelling, color changes, allodynia or dystrophic changes. There is 5 out of 5 strength in the bilateral wrist extension, finger abduction and long finger flexion. There is intact sensation to light touch in all dermatomal and peripheral nerve distributions.  There is a negative Phalen's test bilaterally. There is a negative Hoffmann's test bilaterally.  She can see prominence of the left scapula compared to the right particularly with resisted forward flexion but not specifically winging.  Skin:    General: Skin is warm and dry.     Findings: No erythema or rash.  Neurological:     General: No focal deficit present.     Mental Status: She is alert and oriented to person, place, and time.     Motor: No weakness or abnormal muscle tone.     Coordination: Coordination normal.  Psychiatric:        Mood and Affect: Mood normal.        Behavior: Behavior normal.     Imaging: No results found.

## 2021-06-08 NOTE — Procedures (Signed)
EMG & NCV Findings: All nerve conduction studies (as indicated in the following tables) were within normal limits.    All examined muscles (as indicated in the following table) showed no evidence of electrical instability.    Impression: Essentially NORMAL electrodiagnostic study of the left upper limb.  There is no significant electrodiagnostic evidence of nerve entrapment, brachial plexopathy or cervical radiculopathy.  Specifically no evidence of axonal or demyelinating long thoracic nerve neuropathy although this particular testing is fraught with technical difficulties.  No differences when compared to the left upper extremity study of 2020 although again that study did not specifically look at the long thoracic nerve.   As you know, purely sensory or demyelinating radiculopathies and chemical radiculitis may not be detected with this particular electrodiagnostic study.  Recommendations: 1.  Follow-up with referring physician. 2.  Continue current management of symptoms.  ___________________________ Laurence Spates FAAPMR Board Certified, American Board of Physical Medicine and Rehabilitation    Nerve Conduction Studies Anti Sensory Summary Table   Stim Site NR Peak (ms) Norm Peak (ms) P-T Amp (V) Norm P-T Amp Site1 Site2 Delta-P (ms) Dist (cm) Vel (m/s) Norm Vel (m/s)  Left Median Acr Palm Anti Sensory (2nd Digit)  31.4C  Wrist    3.2 <3.6 27.2 >10 Wrist Palm 1.5 0.0    Palm    1.7 <2.0 26.8         Left Radial Anti Sensory (Base 1st Digit)  31.1C  Wrist    2.2 <3.1 30.6  Wrist Base 1st Digit 2.2 0.0    Left Ulnar Anti Sensory (5th Digit)  31.5C  Wrist    3.1 <3.7 40.7 >15.0 Wrist 5th Digit 3.1 14.0 45 >38   Motor Summary Table   Stim Site NR Onset (ms) Norm Onset (ms) O-P Amp (mV) Norm O-P Amp Site1 Site2 Delta-0 (ms) Dist (cm) Vel (m/s) Norm Vel (m/s)  Left Long Thoracic. Motor (serratus anterior )  31C  axilla    3.4  0.7  axilla serratus anterior  3.4 11.0 32   Left  Median Motor (Abd Poll Brev)  30.6C  Wrist    3.0 <4.2 6.2 >5 Elbow Wrist 3.3 20.5 62 >50  Elbow    6.3  7.6         Left Ulnar Motor (Abd Dig Min)  31C  Wrist    2.6 <4.2 9.6 >3 B Elbow Wrist 2.9 18.5 64 >53  B Elbow    5.5  8.0  A Elbow B Elbow 1.4 9.0 64 >53  A Elbow    6.9  8.0          EMG   Side Muscle Nerve Root Ins Act Fibs Psw Amp Dur Poly Recrt Int Fraser Din Comment  Left Deltoid Axillary C5-6 Nml Nml Nml Nml Nml 0 Nml Nml   Left Supraspinatus SupraScap C5-6 Nml Nml Nml Nml Nml 0 Nml Nml   Left Teres Minor Axillary C5-6 Nml Nml Nml Nml Nml 0 Nml Nml   Left SerratAnt LongThor C5-7 Nml Nml Nml Nml Nml 0 Nml Nml     Nerve Conduction Studies Anti Sensory Left/Right Comparison   Stim Site L Lat (ms) R Lat (ms) L-R Lat (ms) L Amp (V) R Amp (V) L-R Amp (%) Site1 Site2 L Vel (m/s) R Vel (m/s) L-R Vel (m/s)  Median Acr Palm Anti Sensory (2nd Digit)  31.4C  Wrist 3.2   27.2   Wrist Palm     Palm 1.7   26.8  Radial Anti Sensory (Base 1st Digit)  31.1C  Wrist 2.2   30.6   Wrist Base 1st Digit     Ulnar Anti Sensory (5th Digit)  31.5C  Wrist 3.1   40.7   Wrist 5th Digit 45     Motor Left/Right Comparison   Stim Site L Lat (ms) R Lat (ms) L-R Lat (ms) L Amp (mV) R Amp (mV) L-R Amp (%) Site1 Site2 L Vel (m/s) R Vel (m/s) L-R Vel (m/s)  Long Thoracic. Motor (serratus anterior )  31C  axilla 3.4   0.7   axilla serratus anterior  32    Median Motor (Abd Poll Brev)  30.6C  Wrist 3.0   6.2   Elbow Wrist 62    Elbow 6.3   7.6         Ulnar Motor (Abd Dig Min)  31C  Wrist 2.6   9.6   B Elbow Wrist 64    B Elbow 5.5   8.0   A Elbow B Elbow 64    A Elbow 6.9   8.0            Waveforms:

## 2021-06-10 ENCOUNTER — Ambulatory Visit
Admission: RE | Admit: 2021-06-10 | Discharge: 2021-06-10 | Disposition: A | Discharge status: Home / Self Care | Payer: Medicaid Other | Source: Ambulatory Visit | Attending: Orthopedic Surgery | Admitting: Orthopedic Surgery

## 2021-06-10 ENCOUNTER — Other Ambulatory Visit: Payer: Self-pay

## 2021-06-10 DIAGNOSIS — Z96612 Presence of left artificial shoulder joint: Secondary | ICD-10-CM

## 2021-06-10 DIAGNOSIS — G8929 Other chronic pain: Secondary | ICD-10-CM | POA: Diagnosis not present

## 2021-06-10 DIAGNOSIS — M67814 Other specified disorders of tendon, left shoulder: Secondary | ICD-10-CM | POA: Diagnosis not present

## 2021-06-10 DIAGNOSIS — M25512 Pain in left shoulder: Secondary | ICD-10-CM | POA: Diagnosis not present

## 2021-06-10 MED ORDER — IOPAMIDOL (ISOVUE-M 200) INJECTION 41%
12.0000 mL | Freq: Once | INTRAMUSCULAR | Status: AC
  Administered 2021-06-10: 15:00:00 12 mL via INTRA_ARTICULAR

## 2021-06-11 ENCOUNTER — Telehealth: Payer: Self-pay | Admitting: Orthopedic Surgery

## 2021-06-11 NOTE — Telephone Encounter (Signed)
Please advise 

## 2021-06-11 NOTE — Telephone Encounter (Signed)
  Pt called requesting a refill of her muscle relaxer and hydrocodone. Please send to pharmacy on file. Pt phone number is [phone].

## 2021-06-14 ENCOUNTER — Telehealth: Payer: Self-pay | Admitting: Orthopedic Surgery

## 2021-06-14 NOTE — Telephone Encounter (Signed)
Pt called on Friday about refills of muscle relaxer and hydrocodone.Pt states she is out of both. Pt is in severe pains and asking for a call when medications are sent to her pharmacy on file. Please call pt at [phone].

## 2021-06-15 ENCOUNTER — Telehealth: Payer: Self-pay | Admitting: Orthopedic Surgery

## 2021-06-15 ENCOUNTER — Other Ambulatory Visit: Payer: Self-pay | Admitting: Surgical

## 2021-06-15 MED ORDER — HYDROCODONE-ACETAMINOPHEN 5-325 MG PO TABS: 1.0000 | ORAL_TABLET | Freq: Two times a day (BID) | ORAL | 0 refills | Status: DC | PRN

## 2021-06-15 MED ORDER — METHOCARBAMOL 500 MG PO TABS: 500.0000 mg | ORAL_TABLET | Freq: Three times a day (TID) | ORAL | 0 refills | Status: DC | PRN

## 2021-06-15 NOTE — Telephone Encounter (Signed)
PA has been initiated. Case Ref number [id number].  PA currently under further review for determination.

## 2021-06-15 NOTE — Telephone Encounter (Signed)
Advised patient

## 2021-06-15 NOTE — Telephone Encounter (Signed)
Pt called and states she needs prior auth for hydrocodone.   CB [phone]

## 2021-06-15 NOTE — Telephone Encounter (Signed)
Sent in refills 

## 2021-06-21 NOTE — Progress Notes (Signed)
Mri looks good no surgery indicated pls call and cx appt for wed thx

## 2021-06-22 NOTE — Progress Notes (Signed)
IC advised patient of results.

## 2021-06-23 ENCOUNTER — Ambulatory Visit: Payer: Medicaid Other | Admitting: Orthopedic Surgery

## 2021-06-23 ENCOUNTER — Ambulatory Visit: Payer: Medicaid Other | Admitting: Family Medicine

## 2021-06-24 ENCOUNTER — Ambulatory Visit (INDEPENDENT_AMBULATORY_CARE_PROVIDER_SITE_OTHER): Payer: Medicaid Other | Admitting: Sports Medicine

## 2021-06-24 ENCOUNTER — Encounter: Payer: Self-pay | Admitting: Sports Medicine

## 2021-06-24 VITALS — BP 96/68 | Ht 61.0 in | Wt 98.0 lb

## 2021-06-24 DIAGNOSIS — M6530 Trigger finger, unspecified finger: Secondary | ICD-10-CM

## 2021-06-24 MED ORDER — METHYLPREDNISOLONE ACETATE 40 MG/ML IJ SUSP
20.0000 mg | Freq: Once | INTRAMUSCULAR | Status: AC
  Administered 2021-06-24: 20 mg via INTRA_ARTICULAR

## 2021-06-24 NOTE — Progress Notes (Addendum)
   Subjective:    Patient ID: Annette Chang, female    DOB: [birth year], 50 y.o.   MRN: [id number]  HPI chief complaint: Right middle finger pain  Patient is a very pleasant 50 year old right-hand-dominant female that comes in today complaining of approximately 1 month of right middle finger pain and locking.  She initially injured her hand by smashing it in a door.  She saw her PCP and x-rays of the right hand were ordered and they are unremarkable.  She describes pain along the palmar aspect of the middle finger which is making it difficult for finger flexion.  She does note intermittent swelling as well.  She denies any significant injury or surgery to this finger in the past.  She does have a surgical history significant for extensive right shoulder surgery done by Dr. Marlou Sa.  She is disabled as a result of her chronic right shoulder pain.  She denies pain elsewhere throughout the hand.  Interim medical history reviewed Medications reviewed Allergies reviewed    Review of Systems As above    Objective:   Physical Exam  Well-developed, well-nourished.  No acute distress  Right middle finger: No obvious soft tissue swelling.  No deformity.  She does have limited flexion secondary to pain and a palpable tender nodule at the A1 pulley but flexor and extensor tendons are intact.  Brisk capillary refill.  She does have some pitting of several of her fingernails.  X-rays as above    Assessment & Plan:   Right middle finger trigger finger Pitting fingernails  Right middle finger trigger fingers injected with cortisone today.  She tolerates this without difficulty.  If pain persist despite today's injection she will call me and I will refer the patient to hand surgery for possible A1 pulley release.  I also educated her in Band-Aid splinting of the PIP joint at night to help prevent nighttime locking.  For her pending fingernails, a referral has been made by her PCP to dermatology.  I  strongly suggested that she follow-up with them to see what they suggest.  She will follow-up with me as needed.  Consent obtained and verified. Time-out conducted. Noted no overlying erythema, induration, or other signs of local infection. Skin prepped in a sterile fashion. Topical analgesic spray: Ethyl chloride. Joint: Right middle finger trigger finger Needle: TB needle Completed without difficulty. Meds: Half cc 1% Xylocaine, half cc (20 mg) Depo-Medrol  Advised to call if fevers/chills, erythema, induration, drainage, or persistent bleeding.   This note was dictated using Dragon naturally speaking software and may contain errors in syntax, spelling, or content which have not been identified prior to signing this note.

## 2021-06-24 NOTE — Addendum Note (Signed)
Addended by: Jolinda Croak E on: 06/24/2021 09:06 AM   Modules accepted: Orders

## 2021-06-29 ENCOUNTER — Telehealth: Payer: Self-pay | Admitting: Orthopedic Surgery

## 2021-06-29 ENCOUNTER — Other Ambulatory Visit: Payer: Self-pay | Admitting: Family Medicine

## 2021-06-29 DIAGNOSIS — Z1231 Encounter for screening mammogram for malignant neoplasm of breast: Secondary | ICD-10-CM

## 2021-06-29 NOTE — Telephone Encounter (Signed)
IC advised I was under the impression insurance denied from the info that we had received

## 2021-06-29 NOTE — Telephone Encounter (Signed)
Pt called stating she has been trying to get her hydrocodone rx authorized since 06/15/21 and still hasn't heard anything. Pt would like a CB to discuss why it's taking so long and how much longer it might be.   [phone]

## 2021-07-01 ENCOUNTER — Ambulatory Visit
Admission: RE | Admit: 2021-07-01 | Discharge: 2021-07-01 | Disposition: A | Discharge status: Home / Self Care | Payer: Medicaid Other | Source: Ambulatory Visit | Attending: Orthopedic Surgery | Admitting: Orthopedic Surgery

## 2021-07-01 DIAGNOSIS — Z1231 Encounter for screening mammogram for malignant neoplasm of breast: Secondary | ICD-10-CM | POA: Diagnosis not present

## 2021-07-27 ENCOUNTER — Ambulatory Visit: Payer: Medicaid Other | Admitting: Family Medicine

## 2021-07-27 NOTE — Progress Notes (Deleted)
° ° °  SUBJECTIVE:   CHIEF COMPLAINT / HPI:   Severe headaches-   PERTINENT  PMH / PSH: MDD, osteoporosis, GERD  OBJECTIVE:   There were no vitals taken for this visit.  General: A&O, NAD HEENT: No sign of trauma, EOM grossly intact Cardiac: RRR, no m/r/g Respiratory: CTAB, normal WOB, no w/c/r GI: Soft, NTTP, non-distended  Extremities: NTTP, no peripheral edema. Neuro: Normal gait, moves all four extremities appropriately. Psych: Appropriate mood and affect   ASSESSMENT/PLAN:   No problem-specific Assessment & Plan notes found for this encounter.     Lenoria Chime, MD [place]

## 2021-08-16 NOTE — Progress Notes (Signed)
° ° °  SUBJECTIVE:   CHIEF COMPLAINT / HPI:   Presents for screening for diabetes: She states it runs in her family and wants to know if she should be screened. On chart review she had a previous A1C of 5.2 in November.   Current smoker: Was previously smoking about 1 pack every 2 weeks and has been smoking for about 20 years.  She was not interested in patches or gums at her last appointment.  Today she states she smokes about 1-2 cigarettes per week now.  PERTINENT  PMH / PSH: Current smoker  OBJECTIVE:   BP 130/89    Pulse 62    Ht 5\' 1"  (1.549 m)    Wt 95 lb 4 oz (43.2 kg)    SpO2 100%    BMI 18.00 kg/m    General: NAD, pleasant, able to participate in exam Cardiac: RRR, no murmurs. Respiratory: CTAB, normal effort Psych: Normal affect and mood   ASSESSMENT/PLAN:   Current smoker - down to 1-2 cigarettes per day.Will use the nicotine gum and plan to quit smoking by next visit in 1-2 months. Refill for gum placed and discussed proper use of it.  Screening type 2 diabetes: A1C of 5.2 about 3 months ago, recheck not indicated at this time.   [name], [place]

## 2021-08-17 ENCOUNTER — Encounter: Payer: Self-pay | Admitting: Family Medicine

## 2021-08-17 ENCOUNTER — Other Ambulatory Visit: Payer: Self-pay

## 2021-08-17 ENCOUNTER — Ambulatory Visit (INDEPENDENT_AMBULATORY_CARE_PROVIDER_SITE_OTHER): Payer: Medicaid Other | Admitting: Family Medicine

## 2021-08-17 VITALS — BP 130/89 | HR 62 | Ht 61.0 in | Wt 95.2 lb

## 2021-08-17 DIAGNOSIS — F17200 Nicotine dependence, unspecified, uncomplicated: Secondary | ICD-10-CM | POA: Diagnosis present

## 2021-08-17 MED ORDER — NICOTINE POLACRILEX 2 MG MT GUM: 2.0000 mg | CHEWING_GUM | OROMUCOSAL | 1 refills | Status: DC | PRN

## 2021-08-17 NOTE — Patient Instructions (Signed)
I would like to see you back in 2 to 3 months and at that time I would like to see you completely quit from smoking.  I have sent in the nicotine gum for you to use when you get cravings for cigarettes.  I typically recommend buying no more cigarettes from this point forward so you do not have them in the home and use the nicotine gum when you get cravings.  Let me know if I can assist in any other way.  I do not think we need to screen for A1c at this time as we recently checked it.

## 2021-08-18 ENCOUNTER — Telehealth: Payer: Self-pay | Admitting: Orthopedic Surgery

## 2021-08-18 NOTE — Telephone Encounter (Signed)
Pending response from Dr Marlou Sa

## 2021-08-18 NOTE — Telephone Encounter (Signed)
Patient called stating she uses  [facility] at [place] [address]   Ph# [phone]     The number to contact patient is [phone]

## 2021-08-18 NOTE — Telephone Encounter (Signed)
Pt called. She states her left arm is very swollen! She states the robaxin is not helping and the ibuprofen is not working.   CB [phone]

## 2021-08-25 ENCOUNTER — Ambulatory Visit: Payer: Medicaid Other | Admitting: Orthopedic Surgery

## 2021-08-25 ENCOUNTER — Encounter: Payer: Self-pay | Admitting: Orthopedic Surgery

## 2021-08-25 ENCOUNTER — Other Ambulatory Visit: Payer: Self-pay

## 2021-08-25 DIAGNOSIS — M792 Neuralgia and neuritis, unspecified: Secondary | ICD-10-CM

## 2021-08-25 DIAGNOSIS — M958 Other specified acquired deformities of musculoskeletal system: Secondary | ICD-10-CM | POA: Diagnosis not present

## 2021-08-25 DIAGNOSIS — R200 Anesthesia of skin: Secondary | ICD-10-CM | POA: Diagnosis not present

## 2021-08-25 DIAGNOSIS — R202 Paresthesia of skin: Secondary | ICD-10-CM

## 2021-08-25 MED ORDER — TRAMADOL HCL 50 MG PO TABS: 50.0000 mg | ORAL_TABLET | Freq: Two times a day (BID) | ORAL | 0 refills | Status: DC

## 2021-08-25 NOTE — Addendum Note (Signed)
Addended by: Marcene Duos on: 08/25/2021 09:49 AM   Modules accepted: Orders

## 2021-08-25 NOTE — Progress Notes (Signed)
Office Visit Note   Patient: Annette Chang           Date of Birth: [birth year]           MRN: [id number] Visit Date: 08/25/2021 Requested by: [name], DO [address] [place],  [place] [address] PCP: [name], DO  Subjective: Chief Complaint  Patient presents with   Other     Left arm pain    HPI: Patient presents for evaluation of left greater than right shoulder pain.  She also reports some radicular symptoms on the left-hand side.  Does not report any neck pain.  She has significant difficulty with any overhead motion.  She has had 2 prior surgeries on the right side which did not help her scapular winging.  She has difficulty with any overhead reaching essentially above chest level.              ROS: All systems reviewed are negative as they relate to the chief complaint within the history of present illness.  Patient denies  fevers or chills.   Assessment & Plan: Visit Diagnoses:  1. Winged scapula of right side   2. Numbness and tingling in left arm   3. Radicular pain in left arm     Plan: Impression is bilateral upper extremity weakness and loss of overhead motion due to scapular winging.  She has had an MRI scan on the left shoulder which shows intact rotator cuff but she does not have very much functional ability in either arm to do any type of activity above chest level.  This affects her in terms of opening cabinets and opening doors that are higher than chest level.  Door locks in particular are problematic.  She really has no functional ability above chest level and has fairly significant weakness in both arms in general which has been exacerbated by her scapular winging on both sides.  For this reason I think it would be important and essential for her to be in a handicapped apartment where items are at chest and waist level in terms of cabinets and locks..  Tramadol refilled.  Form filled out for the scat bus.  I think she will also have difficulty  pulling herself up onto the bus with the right and left arm.  We will see her back as needed  Follow-Up Instructions: Return if symptoms worsen or fail to improve.   Orders:  No orders of the defined types were placed in this encounter.  No orders of the defined types were placed in this encounter.     Procedures: No procedures performed   Clinical Data: No additional findings.  Objective: Vital Signs: There were no vitals taken for this visit.  Physical Exam:   Constitutional: Patient appears thin HEENT:  Head: Normocephalic Eyes:EOM are abnormal Neck: Normal range of motion Cardiovascular: Normal rate Pulmonary/chest: Effort normal Neurologic: Patient is alert Skin: Skin is warm Psychiatric: Patient has normal mood and affect   Ortho Exam: Ortho exam demonstrates scapular winging bilaterally.  Worse on the right compared to the left.  5 out of 5 grip EPL FPL or osseous wrist flexion extension biceps triceps and deltoid strength.  She does have some muscle wasting in bilateral upper extremities.  Radial pulse intact bilaterally.  Neck range of motion full.  She has to use the left arm to lift the right arm and vice versa when she is trying to do any overhead activity or any activity above chest level.  Specialty Comments:  No specialty comments available.  Imaging: No results found.   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   Iron deficiency anemia 01/02/2012   Past Medical History:  Diagnosis  Date   Anxiety    Arthritis of shoulder region, 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   Blood transfusion without reported diagnosis    Depression    H/O rotator cuff surgery 01/02/2012   H/O tubal ligation 01/02/2012   H/O: C-section 01/02/2012   3    H/O: C-section 01/02/2012   3    Headache(784.0) 01/02/2012   Iron deficiency anemia 01/02/2012   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    Family History  Problem Relation Age of Onset   Diabetes Maternal Grandmother    Diabetes Paternal Grandmother    Diabetes Paternal Grandfather    Colon cancer Neg Hx    Esophageal cancer Neg Hx    Rectal cancer Neg Hx    Stomach cancer Neg Hx     Past Surgical History:  Procedure Laterality Date   CESAREAN SECTION     3 previous c sections   MUSCLE REPAIR Right 01/10/2017   RIGHT PECTORAL MAJOR TO SCAPULA MUSCLE TRANSFER (Right   NOVASURE ABLATION N/A 03/11/2014   Procedure: NOVASURE ABLATION;  Surgeon: Emily Filbert, MD;  Location: [facility];  Service: Gynecology;  Laterality: N/A;   PECTORALIS TENDON REPAIR Right 01/10/2017   Procedure: RIGHT PECTORAL MAJOR TO SCAPULA MUSCLE TRANSFER;  Surgeon: Meredith Pel, MD;  Location: [facility];  Service: Orthopedics;  Laterality: Right;   PECTORALIS TENDON REPAIR Right 12/28/2017   Procedure: RIGHT REVISION TENDON TRANSFER OF PECTORALIS MAJOR;  Surgeon: Meredith Pel, MD;  Location: [facility];  Service: Orthopedics;  Laterality: Right;   rotator cuff surgery     SHOULDER SURGERY     TUBAL LIGATION     Social History   Occupational History   Occupation: unemployed  Tobacco Use   Smoking status: Former    Years: 5.00    Types: Cigarettes    Quit date: 11/2017    Years since quitting: 3.7   Smokeless tobacco: Never  Vaping Use   Vaping Use: Never used  Substance and Sexual Activity   Alcohol use: Yes    Alcohol/week: 14.0 standard drinks    Types: 14 Glasses of wine per week    Comment: weekends   Drug use: Yes    Comment: marijuana used 3 times a weekly    Sexual activity: Not Currently    Birth control/protection: Surgical

## 2021-08-29 NOTE — Telephone Encounter (Signed)
We have seen her since she called

## 2021-09-17 ENCOUNTER — Encounter: Payer: Self-pay | Admitting: Family Medicine

## 2021-09-17 NOTE — Progress Notes (Signed)
Patient has no-showed to multiple appointments in a 6-month period. Per our no-show policy, a letter has been routed to [facility] Admin to be mailed to patient regarding likely dismissal for repeat no show. Will CC to PCP.  ° °

## 2021-09-22 ENCOUNTER — Telehealth: Payer: Self-pay

## 2021-09-22 NOTE — Telephone Encounter (Signed)
Annette Chang from [facility] calls nurse line requesting clarification on alendronate prescription.  ? ?Prescription that was transferred from [facility] is for 35 mg tablet, with instructions to take 70 mg by mouth every 7 days.  ? ?Please advise if patient is to be taking 35 mg or 70 mg. Please send new rx to [facility].  ? ?Thanks.  ? ?Talbot Grumbling, RN ? ?

## 2021-09-23 DIAGNOSIS — H5213 Myopia, bilateral: Secondary | ICD-10-CM | POA: Diagnosis not present

## 2021-09-28 ENCOUNTER — Other Ambulatory Visit: Payer: Self-pay | Admitting: Family Medicine

## 2021-09-28 DIAGNOSIS — M810 Age-related osteoporosis without current pathological fracture: Secondary | ICD-10-CM

## 2021-09-28 MED ORDER — ALENDRONATE SODIUM 70 MG PO TABS: 70.0000 mg | ORAL_TABLET | ORAL | 4 refills | Status: DC
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
## 2021-10-12 ENCOUNTER — Other Ambulatory Visit: Payer: Self-pay

## 2021-10-12 ENCOUNTER — Ambulatory Visit (HOSPITAL_COMMUNITY)
Admission: RE | Admit: 2021-10-12 | Discharge: 2021-10-12 | Disposition: A | Discharge status: Home / Self Care | Payer: Medicaid Other | Source: Ambulatory Visit | Attending: Family Medicine | Admitting: Family Medicine

## 2021-10-12 ENCOUNTER — Ambulatory Visit (INDEPENDENT_AMBULATORY_CARE_PROVIDER_SITE_OTHER): Payer: Medicaid Other | Admitting: Family Medicine

## 2021-10-12 ENCOUNTER — Encounter: Payer: Self-pay | Admitting: Family Medicine

## 2021-10-12 VITALS — BP 120/67 | HR 91 | Ht 61.0 in | Wt 95.8 lb

## 2021-10-12 DIAGNOSIS — M545 Low back pain, unspecified: Secondary | ICD-10-CM

## 2021-10-12 DIAGNOSIS — W19XXXA Unspecified fall, initial encounter: Secondary | ICD-10-CM | POA: Insufficient documentation

## 2021-10-12 DIAGNOSIS — M25552 Pain in left hip: Secondary | ICD-10-CM

## 2021-10-12 DIAGNOSIS — M546 Pain in thoracic spine: Secondary | ICD-10-CM | POA: Diagnosis not present

## 2021-10-12 DIAGNOSIS — S79912A Unspecified injury of left hip, initial encounter: Secondary | ICD-10-CM | POA: Diagnosis present

## 2021-10-12 NOTE — Patient Instructions (Signed)
I am ordering some x-rays for you.  I like for you to go to [facility] in the front entrance to get these x-rays.  I like see back in about a week to discuss the results.  If you get any dizziness, nausea and vomiting, confusion, or new and worsening pains you should go to the ER or see [place] immediately. ? ?We will give you a list of dental resources as well. ?

## 2021-10-20 ENCOUNTER — Other Ambulatory Visit: Payer: Self-pay

## 2021-10-20 MED ORDER — OMEPRAZOLE 20 MG PO CPDR: 20.0000 mg | DELAYED_RELEASE_CAPSULE | Freq: Every day | ORAL | 3 refills | Status: DC

## 2021-10-21 DIAGNOSIS — H524 Presbyopia: Secondary | ICD-10-CM | POA: Diagnosis not present

## 2021-10-21 DIAGNOSIS — H52223 Regular astigmatism, bilateral: Secondary | ICD-10-CM | POA: Diagnosis not present

## 2021-11-08 ENCOUNTER — Ambulatory Visit: Payer: Medicaid Other | Admitting: Family Medicine

## 2021-11-08 NOTE — Progress Notes (Deleted)
? ? ?  SUBJECTIVE:  ? ?CHIEF COMPLAINT / HPI:  ? ?Encounter for completion of paperwork: ?51 year old female presents for completion of paperwork.  Patient is requesting personal care services due to***. She previously saw orthopedics on 09/22/2021.  They recommended it would be essential for her to be in a handicapped apartment where items her chest and waist level in terms of cabinets and locks due to her inability to raise her arms above shoulder height. ? ?PERTINENT  PMH / PSH: *** ? ?OBJECTIVE:  ? ?There were no vitals taken for this visit. *** ? ?General: NAD, pleasant, able to participate in exam ?Cardiac: RRR, no murmurs. ?Respiratory: CTAB, normal effort, No wheezes, rales or rhonchi ?MSK:*** ?Neuro: alert, no obvious focal deficits ?Psych: Normal affect and mood ? ?ASSESSMENT/PLAN:  ? ?No problem-specific Assessment & Plan notes found for this encounter. ?Encounter for completion of paperwork: ?51 year old female presenting with PCS paperwork to be completed.  She is requesting this due to**. ? ? ?[name], DO ?[place]  ? ? ?{    This will disappear when note is signed, click to select method of visit    :1} ?

## 2021-11-09 ENCOUNTER — Ambulatory Visit (INDEPENDENT_AMBULATORY_CARE_PROVIDER_SITE_OTHER): Payer: Medicaid Other | Admitting: Family Medicine

## 2021-11-09 ENCOUNTER — Encounter: Payer: Self-pay | Admitting: Family Medicine

## 2021-11-09 VITALS — BP 119/83 | HR 68 | Ht 61.0 in | Wt 89.5 lb

## 2021-11-09 DIAGNOSIS — Z0289 Encounter for other administrative examinations: Secondary | ICD-10-CM

## 2021-11-09 DIAGNOSIS — R296 Repeated falls: Secondary | ICD-10-CM

## 2021-11-09 NOTE — Patient Instructions (Signed)
I will get your forms completed and faxed today or tomorrow.  Let me know if you need anything else from that. ? ?For your frequent falls I am placing referral for physical therapy.  They should call you in the next 1 or 2 weeks to set up an appointment.  I am also requesting to do a home safety eval to see if there is things that we can do to help modify her home for more safety.  Physical therapy will evaluate you for the need of canes, walkers, or other options.  I would like for you to follow back up with me after you have seen them to discuss additional things we can do to help. ?

## 2021-11-09 NOTE — Progress Notes (Signed)
? ? ?SUBJECTIVE:  ? ?CHIEF COMPLAINT / HPI:  ? ?Encounter for completion of paperwork: ?51 year old female presents for completion of paperwork.  Patient is requesting personal care services due to frequent falls and arm weakness with history of scapular winging. She previously saw orthopedics on 08/25/2021.  They recommended it would be essential for her to be in a handicapped apartment where items her chest and waist level in terms of cabinets and locks due to her inability to raise her arms above shoulder height. She states that the windows in her apartment are difficult to raise, and the steps are difficult to go up and down without falling. Her cabinets are high and hard to reach due to her inability to raise her arms. She previously had a PCS nurse who helped with her with changing close and washing hair as well as dishes and clothes. She has her primary issue as raising arms above head.  ? ?History of frequent falls: ?Has had 4 over the past year per her recollection. She states she will sometimes trip over her feet as so walks. She is not seeing physical therapy. ` ? ?PERTINENT  PMH / PSH: Osteoporosis ? ?OBJECTIVE:  ? ?BP 119/83   Pulse 68   Ht '5\' 1"'$  (1.549 m)   Wt 89 lb 8 oz (40.6 kg)   SpO2 100%   BMI 16.91 kg/m?   ? ?General: NAD, pleasant, able to participate in exam ?Respiratory: No respiratory distress ?MSK: Range of motion is limited to approximately 90 degrees of abduction of bilateral shoulders.  When attempting to passively move her passive range of motion she is unable to do so.  Strength is 4/5 in elbow flexion, extension, grip strength as well as lower extremity strength testing bilaterally. ?Psych: Normal affect and mood ? ?ASSESSMENT/PLAN:  ? ?Encounter for completion of paperwork: ?51 year old female presenting with PCS paperwork to be completed.  She is requesting this due to her of a thoracic nerve palsy as well as frequent falls.  She previously had PCS services but changed her  insurance and needs to have the form recompleted.  States that she has difficulty washing her hair, changing close, and getting items in the cabinet if they are above shoulder height.  States that she often has to use a separate arm to raise her elbow to lock her door at night.  Discussed with her that I would complete the form and if she needs anything extra please let me know. ? ?Frequent falls: ?She has had 4 falls over the past 1 year.  3 of these she was able to catch herself but on one instance she did fall and cause injuries.  She has not yet followed with physical therapy for this.  She request getting set up with physical therapy for evaluation and possible need for assistance devices such as canes or walkers.  I will provide this referral today.  She does have 4/5 strength in bilateral lower extremities.  States that when she fell she typically will "tripped over her feet" or catch her foot in an unusual manner causing her to fall.  She states that she has a lot of stairs to go up to her home and is always concerned that she will slip and fall.  We will place referral for physical therapy and request a home safety evaluation as well to see what we can do to help her with this.  After physical therapy evaluation can decide if she needs any assistance devices based off  their eval. ? ?[name], DO ?[place]  ? ? ? ?

## 2021-11-20 NOTE — Therapy (Incomplete)
?OUTPATIENT PHYSICAL THERAPY LOWER EXTREMITY EVALUATION ? ? ?Patient Name: Annette Chang ?MRN: [id number] ?DOB:[birth year], 51 y.o., female ?Today's Date: 11/20/2021 ? ? ? ?Past Medical History:  ?Diagnosis Date  ? Anxiety   ? Arthritis of shoulder region, 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  ? Blood transfusion without reported diagnosis   ? Depression   ? H/O rotator cuff surgery 01/02/2012  ? H/O tubal ligation 01/02/2012  ? H/O: C-section 01/02/2012  ? 3   ? H/O: C-section 01/02/2012  ? 3   ? Headache(784.0) 01/02/2012  ? Iron deficiency anemia 01/02/2012  ? 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 ?Past Surgical History:  ?Procedure Laterality Date  ? CESAREAN SECTION    ? 3 previous c sections  ? MUSCLE REPAIR Right 01/10/2017  ? RIGHT PECTORAL MAJOR TO SCAPULA MUSCLE TRANSFER (Right  ? NOVASURE ABLATION N/A 03/11/2014  ? Procedure: NOVASURE ABLATION;  Surgeon: Emily Filbert, MD;  Location: [facility];  Service: Gynecology;  Laterality: N/A;  ? PECTORALIS TENDON REPAIR Right 01/10/2017  ? Procedure: RIGHT PECTORAL MAJOR TO SCAPULA MUSCLE TRANSFER;  Surgeon: Meredith Pel, MD;  Location: [place];  Service: Orthopedics;  Laterality: Right;  ? PECTORALIS TENDON REPAIR Right 12/28/2017  ? Procedure: RIGHT REVISION TENDON TRANSFER OF PECTORALIS MAJOR;  Surgeon: Meredith Pel, MD;  Location: [place];  Service: Orthopedics;  Laterality: Right;  ? rotator cuff surgery    ? SHOULDER SURGERY    ? TUBAL LIGATION    ? ?Patient Active Problem List  ? Diagnosis Date Noted  ? Reflux esophagitis 02/03/2021  ? Irregular finger nails 11/27/2020  ? Current smoker 06/26/2020  ? Vaginal discharge 11/15/2019  ? Screening for STD (sexually transmitted disease) 09/20/2019  ? Osteoporosis 12/08/2017  ? 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 MDD (major depressive disorder) 03/29/2016  ? 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 Chronic urticaria 08/06/2015  ? H/O rotator cuff surgery 01/02/2012  ? Arthritis of shoulder region, 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  ? Iron deficiency anemia  01/02/2012  ? ? ?PCP: [name], DO ? ?REFERRING PROVIDER: McDiarmid, Blane Ohara, MD ? ?REFERRING DIAG: R29.6 (ICD-10-CM) - Frequent falls ? ?THERAPY DIAG:  ?No diagnosis found. ? ?ONSET DATE: *** ? ?SUBJECTIVE:  ? ?SUBJECTIVE STATEMENT: ?*** ? ?PERTINENT HISTORY: ?*** ? ?PAIN:  ?Are you having pain? {OPRCPAIN:27236} ? ?PRECAUTIONS: {Therapy precautions:24002} ? ?WEIGHT BEARING RESTRICTIONS {Yes ***/No:24003} ? ?FALLS:  ?Has patient fallen in last 6 months? {fallsyesno:27318} ? ?LIVING ENVIRONMENT: ?Lives with: {[facility] lives with:25569::"lives with their family"} ?Lives in: {Lives in:25570} ?Stairs: {opstairs:27293} ?Has following equipment at home: {Assistive devices:23999} ? ?OCCUPATION: *** ? ?PLOF: {PLOF:24004} ? ?PATIENT GOALS *** ? ? ?OBJECTIVE:  ? ?DIAGNOSTIC FINDINGS: None relevant ? ?PATIENT SURVEYS:  ?ABC scale *** ? ?COGNITION: ? Overall cognitive status: {cognition:24006}   ?  ?SENSATION: ?{sensation:27233} ? ?MUSCLE LENGTH: ?Hamstrings: Right *** deg; Left *** deg ?Thomas test: Right *** deg; Left *** deg ? ?POSTURE:  ?*** ? ?PALPATION: ?*** ? ?LE ROM: ? ?{AROM/PROM:27142} ROM Right ?11/20/2021 Left ?11/20/2021  ?Hip flexion    ?Hip extension    ?Hip abduction    ?Hip adduction    ?Hip internal rotation    ?Hip external rotation    ?Knee flexion    ?Knee extension    ?Ankle dorsiflexion    ?Ankle plantarflexion    ?Ankle inversion    ?Ankle eversion    ? (Blank rows = not tested) ? ?LE MMT: ? ?MMT Right ?11/20/2021 Left ?11/20/2021  ?Hip flexion    ?  Hip extension    ?Hip abduction    ?Hip adduction    ?Hip internal rotation    ?Hip external rotation    ?Knee flexion    ?Knee extension    ?Ankle dorsiflexion    ?Ankle plantarflexion    ?Ankle inversion    ?Ankle eversion    ? (Blank rows = not tested) ? ?LOWER EXTREMITY SPECIAL TESTS:  ?{LEspecialtests:26242} ? ?FUNCTIONAL TESTS:  ?{Functional tests:24029} ? ?GAIT: ?Distance walked: *** ?Assistive device utilized: {Assistive devices:23999} ?Level of  assistance: {Levels of assistance:24026} ?Comments: *** ? ? ? ?TODAY'S TREATMENT: ?*** ? ? ?PATIENT EDUCATION:  ?Education details: *** ?Person educated: {Person educated:25204} ?Education method: {Education Method:25205} ?Education comprehension: {Education Comprehension:25206} ? ? ?HOME EXERCISE PROGRAM: ?*** ? ?ASSESSMENT: ? ?CLINICAL IMPRESSION: ?Patient is a *** y.o. *** who was seen today for physical therapy evaluation and treatment for ***.  ? ? ?OBJECTIVE IMPAIRMENTS {opptimpairments:25111}.  ? ?ACTIVITY LIMITATIONS {activity limitations:25113}.  ? ?PERSONAL FACTORS {Personal factors:25162} are also affecting patient's functional outcome.  ? ? ?REHAB POTENTIAL: {rehabpotential:25112} ? ?CLINICAL DECISION MAKING: {clinical decision making:25114} ? ?EVALUATION COMPLEXITY: {Evaluation complexity:25115} ? ? ?GOALS: ?Goals reviewed with patient? {yes/no:20286} ? ?SHORT TERM GOALS: Target date: {follow up:25551} ? ?*** ?Baseline: ?Goal status: {GOALSTATUS:25110} ? ?2.  *** ?Baseline:  ?Goal status: {GOALSTATUS:25110} ? ?3.  *** ?Baseline:  ?Goal status: {GOALSTATUS:25110} ? ?4.  *** ?Baseline:  ?Goal status: {GOALSTATUS:25110} ? ?5.  *** ?Baseline:  ?Goal status: {GOALSTATUS:25110} ? ?6.  *** ?Baseline:  ?Goal status: {GOALSTATUS:25110} ? ?LONG TERM GOALS: Target date: {follow up:25551} ? ?*** ?Baseline:  ?Goal status: {GOALSTATUS:25110} ? ?2.  *** ?Baseline:  ?Goal status: {GOALSTATUS:25110} ? ?3.  *** ?Baseline:  ?Goal status: {GOALSTATUS:25110} ? ?4.  *** ?Baseline:  ?Goal status: {GOALSTATUS:25110} ? ?5.  *** ?Baseline:  ?Goal status: {GOALSTATUS:25110} ? ?6.  *** ?Baseline:  ?Goal status: {GOALSTATUS:25110} ? ? ?PLAN: ?PT FREQUENCY: {rehab frequency:25116} ? ?PT DURATION: {rehab duration:25117} ? ?PLANNED INTERVENTIONS: {rehab planned interventions:25118::"Therapeutic exercises","Therapeutic activity","Neuromuscular re-education","Balance training","Gait training","Patient/Family education","Joint  mobilization"} ? ?PLAN FOR NEXT SESSION: *** ? ? ?Cherie Ouch, PT ?11/20/2021, 8:56 AM ? ?

## 2021-11-23 ENCOUNTER — Ambulatory Visit: Payer: Medicaid Other

## 2021-11-30 ENCOUNTER — Ambulatory Visit: Payer: Medicaid Other | Attending: Family Medicine

## 2021-11-30 DIAGNOSIS — R2681 Unsteadiness on feet: Secondary | ICD-10-CM | POA: Insufficient documentation

## 2021-11-30 DIAGNOSIS — S248XXD Injury of other specified nerves of thorax, subsequent encounter: Secondary | ICD-10-CM | POA: Diagnosis present

## 2021-11-30 DIAGNOSIS — R296 Repeated falls: Secondary | ICD-10-CM | POA: Insufficient documentation

## 2021-11-30 DIAGNOSIS — M6281 Muscle weakness (generalized): Secondary | ICD-10-CM | POA: Diagnosis present

## 2021-11-30 NOTE — Therapy (Signed)
?OUTPATIENT PHYSICAL THERAPY LOWER EXTREMITY EVALUATION ? ? ?Patient Name: Annette Chang ?MRN: [id number] ?DOB:11-Aug-1970, 51 y.o., female ?Today's Date: 11/30/2021 ? ? PT End of Session - 11/30/21 1551   ? ? Visit Number 1   ? Number of Visits 8   ? Date for PT Re-Evaluation 12/28/21   ? Authorization Type [place] [facility]   ? PT Start Time 1215   ? PT Stop Time 1300   ? PT Time Calculation (min) 45 min   ? Activity Tolerance Patient tolerated treatment well   ? Behavior During Therapy [facility] for tasks assessed/performed   ? ?  ?  ? ?  ? ? ?Past Medical History:  ?Diagnosis Date  ? Anxiety   ? Arthritis of shoulder region, 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  ? Blood transfusion without reported diagnosis   ? Depression   ? H/O rotator cuff surgery 01/02/2012  ? H/O tubal ligation 01/02/2012  ? H/O: C-section 01/02/2012  ? 3   ? H/O: C-section 01/02/2012  ? 3   ? Headache(784.0) 01/02/2012  ? Iron deficiency anemia 01/02/2012  ? 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 ?Past Surgical History:  ?Procedure Laterality Date  ? CESAREAN SECTION    ? 3 previous c sections  ? MUSCLE REPAIR Right 01/10/2017  ? RIGHT PECTORAL MAJOR TO SCAPULA MUSCLE TRANSFER (Right  ? NOVASURE ABLATION N/A 03/11/2014  ? Procedure: NOVASURE ABLATION;  Surgeon: Emily Filbert, MD;  Location: [facility];  Service: Gynecology;  Laterality: N/A;  ? PECTORALIS TENDON REPAIR Right 01/10/2017  ? Procedure: RIGHT PECTORAL MAJOR TO SCAPULA MUSCLE TRANSFER;  Surgeon: Meredith Pel, MD;  Location: [place];  Service: Orthopedics;  Laterality: Right;  ? PECTORALIS TENDON REPAIR Right 12/28/2017  ? Procedure: RIGHT REVISION TENDON TRANSFER OF PECTORALIS MAJOR;  Surgeon: Meredith Pel, MD;  Location: [place];  Service: Orthopedics;  Laterality: Right;  ? rotator cuff surgery    ? SHOULDER SURGERY    ? TUBAL LIGATION    ? ?Patient Active Problem List  ? Diagnosis Date Noted  ? Reflux esophagitis 02/03/2021  ? Irregular finger nails 11/27/2020  ? Current smoker 06/26/2020  ? Vaginal  discharge 11/15/2019  ? Screening for STD (sexually transmitted disease) 09/20/2019  ? Osteoporosis 12/08/2017  ? 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 MDD (major depressive disorder) 03/29/2016  ? 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 Chronic urticaria 08/06/2015  ? H/O rotator cuff surgery 01/02/2012  ? Arthritis of shoulder region, 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  ? Iron deficiency anemia 01/02/2012  ? ? ?PCP: [name], DO ? ?REFERRING PROVIDER: McDiarmid, Blane Ohara, MD ? ?REFERRING DIAG: R29.6 (ICD-10-CM) - Frequent falls  ? ?THERAPY DIAG: instability of gait ? ? ?ONSET DATE: 07/25/21 ? ?SUBJECTIVE:  ? ?SUBJECTIVE STATEMENT: ?She has had 4 falls over the past 1 year. 3 of these she was able to catch herself but on one instance she did fall and cause injuries. She has not yet followed with physical therapy for this. She request getting set up with physical therapy for evaluation and possible need for assistance devices such as canes or walkers. I will provide this referral today. She does have 4/5 strength in bilateral lower extremities. States that when she fell she typically will "tripped over her feet" or catch her foot in an unusual manner causing her to fall. She states that she has a lot of stairs to go up to her home and is always concerned that she will slip and fall. We  will place referral for physical therapy and request a home safety evaluation as well to see what we can do to help her with this. After physical therapy evaluation can decide if she needs any assistance devices based off their eval.  ? ?PERTINENT HISTORY: ?51 year old female presents for completion of paperwork.  Patient is requesting personal care services due to frequent falls and arm weakness with history of scapular winging. She previously saw orthopedics on 08/25/2021.  They recommended it would be essential for her to be in a handicapped apartment where items her chest and waist level in terms of cabinets and locks due to her inability to raise her arms  above shoulder height. She states that the windows in her apartment are difficult to raise, and the steps are difficult to go up and down without falling. Her cabinets are high and hard to reach due to her inability to raise her arms. She previously had a PCS nurse who helped with her with changing close and washing hair as well as dishes and clothes. She has her primary issue as raising arms above head.  ? ?Attended [facility] on 1988 ? ?PAIN:  ?Are you having pain? No ? ?PRECAUTIONS: Fall ? ?WEIGHT BEARING RESTRICTIONS No ? ?FALLS:  ?Has patient fallen in last 6 months? Yes. Number of falls 4 ? ?LIVING ENVIRONMENT: ?Lives with: lives alone ?Lives in: House/apartment ?Stairs: Yes: External: 6 steps; yes ? ? ?OCCUPATION: disabled ? ?PLOF: Independent with basic ADLs and Needs assistance with gait ? ?PATIENT GOALS To improve my balance  ? ? ?OBJECTIVE:  ? ?DIAGNOSTIC FINDINGS: none available ? ?PATIENT SURVEYS:  ?LEFS 22/80 ? ?COGNITION: ? Overall cognitive status: Within functional limits for tasks assessed   ?  ?SENSATION: ?WFL ? ?MUSCLE LENGTH: ?Hamstrings: Right 90 deg; Left 90 deg ? ?POSTURE:  ?Winged scapulae ? ?PALPATION: ?Not tested ? ?LE ROM: ? ?Active ROM Right ?11/30/2021 Left ?11/30/2021  ?Hip flexion [facility] WFL  ?Hip extension [facility] WFL  ?Hip abduction    ?Hip adduction    ?Hip internal rotation    ?Hip external rotation    ?Knee flexion    ?Knee extension    ?Ankle dorsiflexion [facility] WFL  ?Ankle plantarflexion [facility] WFL  ?Ankle inversion    ?Ankle eversion    ? (Blank rows = not tested) ? ?LE MMT: ? ?MMT Right ?11/30/2021 Left ?11/30/2021  ?Hip flexion 4- 4-  ?Hip extension 4- 4-  ?Hip abduction 4- 4-  ?Hip adduction    ?Hip internal rotation    ?Hip external rotation    ?Knee flexion 4- 4-  ?Knee extension 4- 4-  ?Ankle dorsiflexion 4+ 4+  ?Ankle plantarflexion 4+ 4+  ?Ankle inversion    ?Ankle eversion    ? (Blank rows = not tested) ? ?LOWER EXTREMITY SPECIAL TESTS:  ?N/a ? ?FUNCTIONAL TESTS:  ?5 times sit to stand:  20s w/o UE support ?SL stance 10s w/o UE support ? ? 11/30/21 0001  ?Dynamic Gait Index  ?Level Surface 3  ?Change in Gait Speed 3  ?Gait with Horizontal Head Turns 2  ?Gait with Vertical Head Turns 2  ?Gait and Pivot Turn 3  ?Step Over Obstacle 2  ?Step Around Obstacles 3  ?Steps 2  ?Total Score 20  ? ? ?GAIT: ?Distance walked: 42f x2 ?Assistive device utilized: None ?Level of assistance: Modified independence ? ? ?TODAY'S TREATMENT: ?Evaluation ? ? ?PATIENT EDUCATION:  ?Education details: Discussed eval findings, rehab rationale and POC and patient is in agreement  ?Person  educated: Patient ?Education method: Explanation and Demonstration ?Education comprehension: verbalized understanding, returned demonstration, and needs further education ? ? ?HOME EXERCISE PROGRAM: ?Access Code: [email or web address] ?URL: [email or web address] ?Date: 11/30/2021 ?Prepared by: Sharlynn Oliphant ? ?Exercises ?- Sit to Stand  - 2 x daily - 7 x weekly - 2 sets - 5 reps ?- Clamshell  - 2 x daily - 7 x weekly - 2 sets - 10 reps ?- Figure 4 Bridge  - 2 x daily - 7 x weekly - 2 sets - 10 reps ? ?ASSESSMENT: ? ?CLINICAL IMPRESSION: ?Patient is a 51 y.o. female  who was seen today for physical therapy evaluation and treatment for balance dysfunction resulting in frequent falls.  DGI score is WFL, B LE flexibility is WNL, 5x STS time above normal ranges and weakness noted through B hips, especially abduction with extension bias.  ? ? ?OBJECTIVE IMPAIRMENTS Abnormal gait, decreased activity tolerance, decreased coordination, decreased endurance, difficulty walking, decreased strength, impaired UE functional use, postural dysfunction, and pain.  ? ?ACTIVITY LIMITATIONS cleaning, community activity, driving, meal prep, laundry, yard work, and shopping.  ? ?PERSONAL FACTORS Fitness, Past/current experiences, Time since onset of injury/illness/exacerbation, and 1 comorbidity: thoracic palsy   are also affecting patient's functional outcome.   ? ? ?REHAB POTENTIAL: Good ? ?CLINICAL DECISION MAKING: Evolving/moderate complexity ? ?EVALUATION COMPLEXITY: Moderate ? ? ?GOALS: ?Goals reviewed with patient? Yes ? ?SHORT TERM GOALS: Target date: 12/14/2021

## 2021-12-08 ENCOUNTER — Ambulatory Visit (INDEPENDENT_AMBULATORY_CARE_PROVIDER_SITE_OTHER): Payer: Medicaid Other | Admitting: Dermatology

## 2021-12-08 ENCOUNTER — Ambulatory Visit: Payer: Medicaid Other

## 2021-12-08 DIAGNOSIS — M2550 Pain in unspecified joint: Secondary | ICD-10-CM | POA: Diagnosis not present

## 2021-12-08 DIAGNOSIS — L608 Other nail disorders: Secondary | ICD-10-CM | POA: Diagnosis not present

## 2021-12-08 DIAGNOSIS — L609 Nail disorder, unspecified: Secondary | ICD-10-CM

## 2021-12-08 NOTE — Progress Notes (Signed)
   New Patient Visit  Subjective  Annette Chang is a 51 y.o. female who presents for the following: Nail problem (Finger dystrophy that started three months ago - no known fhx of psoriasis. Patient c/o j/a in the hips, all over body, and hands. Stiffness of the joints in the fingers. No rashes on the body or inside the mouth).  The following portions of the chart were reviewed this encounter and updated as appropriate:   Tobacco  Allergies  Meds  Problems  Med Hx  Surg Hx  Fam Hx     Review of Systems:  No other skin or systemic complaints except as noted in HPI or Assessment and Plan.  Objective  Well appearing patient in no apparent distress; mood and affect are within normal limits.  A focused examination was performed including the hands and feet . Relevant physical exam findings are noted in the Assessment and Plan.  B/L toenails and finger nails Distal toenail dystrophy improving proximally. The skin on the feet appears normal. Palms, soles, elbows, and knees clear.                     Assessment & Plan  Arthralgia, -possible psoriatic arthritis? 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 Patient c/o all over joint aches and pain, finger stiffness. No evidence of fungal infection.   Dr. 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   Recommend referral to rheumatologist since patient c/o joint pain and stiffness. Will await their recommendations before starting treatment.   Psoriasis is a chronic non-curable, but treatable genetic/hereditary disease that may have other systemic features affecting other organ systems such as joints (Psoriatic Arthritis). It is associated with an increased risk of inflammatory bowel  disease, heart disease, non-alcoholic fatty liver disease, and depression.    Return in about 3 months (around 03/10/2022).  Luther Redo, CMA, am acting as scribe for Sarina Ser, MD . Documentation: I have reviewed the above documentation for accuracy and completeness, and I agree with the above.  Sarina Ser, MD

## 2021-12-08 NOTE — Patient Instructions (Signed)

## 2021-12-10 ENCOUNTER — Ambulatory Visit: Payer: Medicaid Other

## 2021-12-10 NOTE — Therapy (Deleted)
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   Past Surgical History:  Procedure Laterality Date   CESAREAN SECTION     3 previous c sections   MUSCLE REPAIR Right 01/10/2017   RIGHT PECTORAL MAJOR TO SCAPULA MUSCLE TRANSFER (Right   NOVASURE ABLATION N/A 03/11/2014   Procedure: NOVASURE ABLATION;  Surgeon: Emily Filbert, MD;  Location: [facility];  Service: Gynecology;  Laterality: N/A;   PECTORALIS TENDON REPAIR Right 01/10/2017   Procedure: RIGHT PECTORAL MAJOR TO SCAPULA MUSCLE TRANSFER;  Surgeon: Meredith Pel, MD;  Location: [facility];  Service: Orthopedics;  Laterality: Right;   PECTORALIS TENDON REPAIR Right 12/28/2017   Procedure: RIGHT REVISION TENDON TRANSFER OF PECTORALIS MAJOR;  Surgeon: Meredith Pel, MD;  Location: [facility];  Service: Orthopedics;  Laterality: Right;   rotator cuff surgery     SHOULDER SURGERY     TUBAL LIGATION     Patient Active Problem List   Diagnosis Date Noted   Reflux esophagitis 02/03/2021   Irregular finger nails 11/27/2020   Current smoker 06/26/2020   Vaginal discharge 11/15/2019   Screening for STD (sexually transmitted disease) 09/20/2019   Osteoporosis 12/08/2017   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  Patient is requesting personal care services due to frequent falls and arm weakness with history of scapular winging. She previously saw orthopedics on 08/25/2021.  They recommended it would be essential for her to be in a handicapped apartment where items her chest and waist level in terms of cabinets and locks due to her inability to raise her arms above shoulder height. She states that the windows in her apartment are difficult to raise, and the steps are difficult to go up and down without falling. Her cabinets are high and hard to reach due to her inability to raise her arms. She previously had a PCS nurse who helped with her with changing close and washing hair as well as dishes and clothes. She has her primary issue as raising arms above head.    Attended [employer] on 1988  PRECAUTIONS: fall  SUBJECTIVE: ***  PAIN:  Are you having pain? {OPRCPAIN:[id number]}   OBJECTIVE: (objective measures completed at initial evaluation unless otherwise dated)   OBJECTIVE:    DIAGNOSTIC FINDINGS: none available   PATIENT SURVEYS:  LEFS 22/80   COGNITION:           Overall cognitive status: Within functional  limits for tasks assessed                          SENSATION: [facility]   MUSCLE LENGTH: Hamstrings: Right 90 deg; Left 90 deg   POSTURE:  Winged scapulae   PALPATION: Not tested   LE ROM:   Active ROM Right 11/30/2021 Left 11/30/2021  Hip flexion [facility]  Hip extension [facility]  Hip abduction      Hip adduction      Hip internal rotation      Hip external rotation      Knee flexion      Knee extension      Ankle dorsiflexion [facility]  Ankle plantarflexion [facility] WFL  Ankle  inversion      Ankle eversion       (Blank rows = not tested)   LE MMT:   MMT Right 11/30/2021 Left 11/30/2021  Hip flexion 4- 4-  Hip extension 4- 4-  Hip abduction 4- 4-  Hip adduction      Hip internal rotation      Hip external rotation      Knee flexion 4- 4-  Knee extension 4- 4-  Ankle dorsiflexion 4+ 4+  Ankle plantarflexion 4+ 4+  Ankle inversion      Ankle eversion       (Blank rows = not tested)   LOWER EXTREMITY SPECIAL TESTS:  N/a   FUNCTIONAL TESTS:  5 times sit to stand: 20s w/o UE support SL stance 10s w/o UE support     11/30/21 0001  Dynamic Gait Index  Level Surface 3  Change in Gait Speed 3  Gait with Horizontal Head Turns 2  Gait with Vertical Head Turns 2  Gait and Pivot Turn 3  Step Over Obstacle 2  Step Around Obstacles 3  Steps 2  Total Score 20      GAIT: Distance walked: 52f x2 Assistive device utilized: None Level of assistance: Modified independence     TODAY'S TREATMENT: Evaluation     PATIENT EDUCATION:  Education details: Discussed eval findings, rehab rationale and POC and patient is in agreement  Person educated: Patient Education method: ECustomer service managerEducation comprehension: verbalized understanding, returned demonstration, and needs further education     HOME EXERCISE PROGRAM: Access Code: [email or web address] URL: [email or web address] Date: 11/30/2021 Prepared by: JSharlynn Oliphant  Exercises - Sit to Stand  - 2 x daily - 7 x weekly - 2 sets - 5 reps - Clamshell  - 2 x daily - 7 x weekly - 2 sets - 10 reps - Figure 4 Bridge  - 2 x daily - 7 x weekly - 2 sets - 10 reps   ASSESSMENT:   CLINICAL IMPRESSION: Patient is a 51y.o. female  who was seen today for physical therapy evaluation and treatment for balance dysfunction resulting in frequent falls.  DGI score is WFL, B LE flexibility is WNL, 5x STS time above normal ranges and weakness noted through B hips, especially abduction with extension  bias.      OBJECTIVE IMPAIRMENTS Abnormal gait, decreased activity tolerance, decreased coordination, decreased endurance, difficulty walking, decreased strength, impaired UE functional use, postural dysfunction, and pain.    ACTIVITY LIMITATIONS cleaning, community activity, driving, meal prep, laundry, yard work, and shopping.    PERSONAL FACTORS Fitness, Past/current experiences, Time since onset of injury/illness/exacerbation, and 1 comorbidity: thoracic palsy   are also affecting patient's functional outcome.  REHAB POTENTIAL: Good   CLINICAL DECISION MAKING: Evolving/moderate complexity   EVALUATION COMPLEXITY: Moderate     GOALS: Goals reviewed with patient? Yes   SHORT TERM GOALS: Target date: 12/14/2021     Patient to demonstrate independence in HEP  Baseline:[email or web address] Goal status: INITIAL   2.  Patient to score 24/30 on FGA Baseline: TBD Goal status: INITIAL     LONG TERM GOALS: Target date: 12/28/2021     Increase B hip and knee strength to 4+/5 Baseline:  MMT Right 11/30/2021 Left 11/30/2021  Hip flexion 4- 4-  Hip extension 4- 4-  Hip abduction 4- 4-  Hip adduction      Hip internal rotation      Hip external rotation      Knee flexion 4- 4-  Knee extension 4- 4-  Ankle dorsiflexion 4+ 4+  Ankle plantarflexion 4+ 4+    Goal status: INITIAL   2.  Patient to decrease 5x STS time to 15s w/o UE support Baseline: 20s w/o UE support Goal status: INITIAL   3.  Assess for most appropriate AD Baseline: TBD Goal status: INITIAL   4.  Increase LEFS score to 40/80 Baseline: 22/80 Goal status: INITIAL         PLAN: PT FREQUENCY: 2x/week   PT DURATION: 4 weeks   PLANNED INTERVENTIONS: Therapeutic exercises, Therapeutic activity, Neuromuscular re-education, Balance training, Gait training, Patient/Family education, Joint mobilization, Stair training, and DME instructions   PLAN FOR NEXT SESSION: HEP update, B hip and LE strengthening, functional  tasks, stair training, aerobic tasks    Lanice Shirts, PT 12/10/2021, 12:03 PM

## 2021-12-14 ENCOUNTER — Telehealth: Payer: Self-pay

## 2021-12-14 ENCOUNTER — Ambulatory Visit: Payer: Medicaid Other

## 2021-12-14 NOTE — Telephone Encounter (Signed)
Spoke with patient about missed appointment. She recently saw her doctor who diagnosed her with psoriasis and has given her a referral to rheumatology for joint aches/pains and her doctor advised that PT is not helpful at this time and to follow up with rheumatology first. Patient would like to pause PT for now and she was advised she can call us and get a new referral if she would like to pick back up with PT.  Evelene Croon, PTA 12/14/21 2:12 PM

## 2021-12-14 NOTE — Therapy (Incomplete)
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   Past Surgical History:  Procedure Laterality Date   CESAREAN SECTION     3 previous c sections   MUSCLE REPAIR Right 01/10/2017   RIGHT PECTORAL MAJOR TO SCAPULA MUSCLE TRANSFER (Right   NOVASURE ABLATION N/A 03/11/2014   Procedure: NOVASURE ABLATION;  Surgeon: Emily Filbert, MD;  Location: [facility];  Service: Gynecology;  Laterality: N/A;   PECTORALIS TENDON REPAIR Right 01/10/2017   Procedure: RIGHT PECTORAL MAJOR TO SCAPULA MUSCLE TRANSFER;  Surgeon: Meredith Pel, MD;  Location: [place];  Service: Orthopedics;  Laterality: Right;   PECTORALIS TENDON REPAIR Right 12/28/2017   Procedure: RIGHT REVISION TENDON TRANSFER OF PECTORALIS MAJOR;  Surgeon: Meredith Pel, MD;  Location: [facility];  Service: Orthopedics;  Laterality: Right;   rotator cuff surgery     SHOULDER SURGERY     TUBAL LIGATION     Patient Active Problem List   Diagnosis Date Noted   Reflux esophagitis 02/03/2021   Irregular finger nails 11/27/2020   Current smoker 06/26/2020   Vaginal discharge 11/15/2019   Screening for STD (sexually transmitted disease) 09/20/2019   Osteoporosis 12/08/2017   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  Rationale for Evaluation and Treatment Rehabilitation  PERTINENT HISTORY: 52 year old female presents for completion of paperwork.  Patient is requesting personal care services due to frequent falls and arm weakness with history of scapular winging. She previously saw orthopedics on 08/25/2021.  They recommended it would be essential for her to be in a handicapped apartment where items her chest and waist level in terms of cabinets and locks due to her inability to raise her arms above shoulder height. She states that the windows in her apartment are difficult to raise, and the steps are difficult to go up and down without falling. Her cabinets are high and hard to reach due to her inability to raise her arms. She previously had a PCS nurse who helped with her with changing close and washing hair as well as dishes and clothes. She has her primary issue as raising arms above head.    Attended [employer] on 1988  PRECAUTIONS: Fall  ONSET DATE: 07/25/21  SUBJECTIVE: ***  PAIN:  Are you having pain? No ***   OBJECTIVE: (objective measures completed at initial evaluation unless otherwise dated)   DIAGNOSTIC FINDINGS: none available   PATIENT SURVEYS:  LEFS 22/80   COGNITION:           Overall cognitive status: Within functional limits  for tasks assessed                          SENSATION: [facility]   MUSCLE LENGTH: Hamstrings: Right 90 deg; Left 90 deg   POSTURE:  Winged scapulae   PALPATION: Not tested   LE ROM:   Active ROM Right 11/30/2021 Left 11/30/2021  Hip flexion [facility]  Hip extension [facility]  Hip abduction      Hip adduction      Hip internal rotation      Hip external rotation      Knee flexion      Knee extension      Ankle dorsiflexion [facility]  Ankle plantarflexion [facility] WFL  Ankle inversion       Ankle eversion       (Blank rows = not tested)   LE MMT:   MMT Right 11/30/2021 Left 11/30/2021  Hip flexion 4- 4-  Hip extension 4- 4-  Hip abduction 4- 4-  Hip adduction      Hip internal rotation      Hip external rotation      Knee flexion 4- 4-  Knee extension 4- 4-  Ankle dorsiflexion 4+ 4+  Ankle plantarflexion 4+ 4+  Ankle inversion      Ankle eversion       (Blank rows = not tested)   LOWER EXTREMITY SPECIAL TESTS:  N/a   FUNCTIONAL TESTS:  5 times sit to stand: 20s w/o UE support SL stance 10s w/o UE support     11/30/21 0001  Dynamic Gait Index  Level Surface 3  Change in Gait Speed 3  Gait with Horizontal Head Turns 2  Gait with Vertical Head Turns 2  Gait and Pivot Turn 3  Step Over Obstacle 2  Step Around Obstacles 3  Steps 2  Total Score 20      GAIT: Distance walked: 39f x2 Assistive device utilized: None Level of assistance: Modified independence     TODAY'S TREATMENT: [facility] Adult PT Treatment:                                                DATE: 12/14/2021 Therapeutic Exercise: Nustep level 5 x 5 mins STS Single leg bridge Cybex hip flexion/abduction/extension 17.5# 2x10 Omega knee extension Omega knee flexion Standing heel/toe raises Slant board stretch 3x45" Manual Therapy: *** Neuromuscular re-ed: Romberg stance EO/EC Tandem stance Romberg stance on Airex Tandem stance on Airex Marching on Airex Therapeutic Activity: *** Modalities: *** Self Care: ***   11/30/2021: Evaluation     PATIENT EDUCATION:  Education details: Discussed eval findings, rehab rationale and POC and patient is in agreement  Person educated: Patient Education method: Explanation and Demonstration Education comprehension: verbalized understanding, returned demonstration, and needs further education     HOME EXERCISE PROGRAM: Access Code: [email or web address] URL: [email or web address] Date: 11/30/2021 Prepared by: JSharlynn Oliphant   Exercises - Sit to Stand  - 2 x daily - 7 x weekly - 2 sets - 5 reps - Clamshell  - 2 x daily - 7 x weekly - 2 sets - 10 reps - Figure 4 Bridge  - 2 x daily - 7 x weekly - 2 sets - 10 reps   ASSESSMENT:   CLINICAL IMPRESSION: ***  Patient is a 51y.o. female  who  was seen today for physical therapy evaluation and treatment for balance dysfunction resulting in frequent falls.  DGI score is WFL, B LE flexibility is WNL, 5x STS time above normal ranges and weakness noted through B hips, especially abduction with extension bias.      OBJECTIVE IMPAIRMENTS Abnormal gait, decreased activity tolerance, decreased coordination, decreased endurance, difficulty walking, decreased strength, impaired UE functional use, postural dysfunction, and pain.    ACTIVITY LIMITATIONS cleaning, community activity, driving, meal prep, laundry, yard work, and shopping.    PERSONAL FACTORS Fitness, Past/current experiences, Time since onset of injury/illness/exacerbation, and 1 comorbidity: thoracic palsy   are also affecting patient's functional outcome.      REHAB POTENTIAL: Good   CLINICAL DECISION MAKING: Evolving/moderate complexity   EVALUATION COMPLEXITY: Moderate     GOALS: Goals reviewed with patient? Yes   SHORT TERM GOALS: Target date: 12/14/2021     Patient to demonstrate independence in HEP  Baseline:[email or web address] Goal status: INITIAL   2.  Patient to score 24/30 on FGA Baseline: TBD Goal status: INITIAL     LONG TERM GOALS: Target date: 12/28/2021     Increase B hip and knee strength to 4+/5 Baseline:  MMT Right 11/30/2021 Left 11/30/2021  Hip flexion 4- 4-  Hip extension 4- 4-  Hip abduction 4- 4-  Hip adduction      Hip internal rotation      Hip external rotation      Knee flexion 4- 4-  Knee extension 4- 4-  Ankle dorsiflexion 4+ 4+  Ankle plantarflexion 4+ 4+    Goal status: INITIAL   2.  Patient to decrease 5x STS time to 15s w/o UE support Baseline: 20s w/o UE  support Goal status: INITIAL   3.  Assess for most appropriate AD Baseline: TBD Goal status: INITIAL   4.  Increase LEFS score to 40/80 Baseline: 22/80 Goal status: INITIAL         PLAN: PT FREQUENCY: 2x/week   PT DURATION: 4 weeks   PLANNED INTERVENTIONS: Therapeutic exercises, Therapeutic activity, Neuromuscular re-education, Balance training, Gait training, Patient/Family education, Joint mobilization, Stair training, and DME instructions   PLAN FOR NEXT SESSION: HEP update, B hip and LE strengthening, functional tasks, stair training, aerobic tasks    [employer], PTA 12/14/2021, 9:06 AM

## 2021-12-18 ENCOUNTER — Encounter: Payer: Self-pay | Admitting: Dermatology

## 2021-12-21 ENCOUNTER — Ambulatory Visit: Payer: Medicaid Other

## 2021-12-23 DIAGNOSIS — G589 Mononeuropathy, unspecified: Secondary | ICD-10-CM | POA: Diagnosis not present

## 2021-12-24 DIAGNOSIS — G589 Mononeuropathy, unspecified: Secondary | ICD-10-CM | POA: Diagnosis not present

## 2021-12-27 DIAGNOSIS — G589 Mononeuropathy, unspecified: Secondary | ICD-10-CM | POA: Diagnosis not present

## 2021-12-28 ENCOUNTER — Ambulatory Visit: Payer: Medicaid Other

## 2021-12-28 DIAGNOSIS — G589 Mononeuropathy, unspecified: Secondary | ICD-10-CM | POA: Diagnosis not present

## 2021-12-29 DIAGNOSIS — G589 Mononeuropathy, unspecified: Secondary | ICD-10-CM | POA: Diagnosis not present

## 2021-12-30 DIAGNOSIS — G589 Mononeuropathy, unspecified: Secondary | ICD-10-CM | POA: Diagnosis not present

## 2021-12-31 DIAGNOSIS — G589 Mononeuropathy, unspecified: Secondary | ICD-10-CM | POA: Diagnosis not present

## 2022-01-03 DIAGNOSIS — G589 Mononeuropathy, unspecified: Secondary | ICD-10-CM | POA: Diagnosis not present

## 2022-01-04 DIAGNOSIS — G589 Mononeuropathy, unspecified: Secondary | ICD-10-CM | POA: Diagnosis not present

## 2022-01-05 DIAGNOSIS — G589 Mononeuropathy, unspecified: Secondary | ICD-10-CM | POA: Diagnosis not present

## 2022-01-06 DIAGNOSIS — G589 Mononeuropathy, unspecified: Secondary | ICD-10-CM | POA: Diagnosis not present

## 2022-01-07 DIAGNOSIS — G589 Mononeuropathy, unspecified: Secondary | ICD-10-CM | POA: Diagnosis not present

## 2022-01-10 DIAGNOSIS — G589 Mononeuropathy, unspecified: Secondary | ICD-10-CM | POA: Diagnosis not present

## 2022-01-11 DIAGNOSIS — G589 Mononeuropathy, unspecified: Secondary | ICD-10-CM | POA: Diagnosis not present

## 2022-01-12 DIAGNOSIS — G589 Mononeuropathy, unspecified: Secondary | ICD-10-CM | POA: Diagnosis not present

## 2022-01-13 DIAGNOSIS — G589 Mononeuropathy, unspecified: Secondary | ICD-10-CM | POA: Diagnosis not present

## 2022-01-17 ENCOUNTER — Other Ambulatory Visit: Payer: Self-pay | Admitting: Family Medicine

## 2022-01-27 ENCOUNTER — Other Ambulatory Visit: Payer: Self-pay | Admitting: *Deleted

## 2022-01-27 DIAGNOSIS — Z1231 Encounter for screening mammogram for malignant neoplasm of breast: Secondary | ICD-10-CM

## 2022-02-14 ENCOUNTER — Other Ambulatory Visit: Payer: Self-pay | Admitting: Family Medicine

## 2022-03-17 ENCOUNTER — Ambulatory Visit: Payer: Medicaid Other | Admitting: Dermatology

## 2022-03-17 DIAGNOSIS — L409 Psoriasis, unspecified: Secondary | ICD-10-CM

## 2022-03-17 DIAGNOSIS — M1990 Unspecified osteoarthritis, unspecified site: Secondary | ICD-10-CM | POA: Diagnosis not present

## 2022-03-17 DIAGNOSIS — Z79899 Other long term (current) drug therapy: Secondary | ICD-10-CM

## 2022-03-17 DIAGNOSIS — L609 Nail disorder, unspecified: Secondary | ICD-10-CM

## 2022-03-17 HISTORY — DX: Psoriasis, unspecified: L40.9

## 2022-03-17 NOTE — Progress Notes (Signed)
   Follow-Up Visit   Subjective  Annette Chang is a 51 y.o. female who presents for the following: Nail Problem (Patient here today for follow up on nail problem at fingernails and toenails. Reports still having some pain. Referred to rheumatologist for evaluation for possible psoriatic arthritis.  Patient states appointment with rheumatology is not until November 2023. ).  The following portions of the chart were reviewed this encounter and updated as appropriate:  Tobacco  Allergies  Meds  Problems  Med Hx  Surg Hx  Fam Hx     Review of Systems: No other skin or systemic complaints except as noted in HPI or Assessment and Plan.  Objective  Well appearing patient in no apparent distress; mood and affect are within normal limits.  A focused examination was performed including fingernails and toenails. Relevant physical exam findings are noted in the Assessment and Plan.   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 Patient c/o all over joint aches and pain, finger stiffness. No evidence of fungal infection.    referral to rheumatologist sent - patient has appointment in November  Start Otezla starter pack - take as directed. Titrate 10 mg to 30 mg.  Qd.  Side effects of Otezla (apremilast) include diarrhea, nausea, headache, upper respiratory infection, depression, and weight decrease (5-10%). It should only be taken by pregnant women after a discussion regarding risks and benefits with their doctor. Goal is control of skin condition, not cure.  The use of [place] Nail requires long term medication management, including periodic office visits.  Follow up in 4 weeks, if improved will consider increasing to twice daily    Psoriasis is a chronic non-curable, but treatable genetic/hereditary disease that may have other systemic  features affecting other organ systems such as joints (Psoriatic Arthritis). It is associated with an increased risk of inflammatory bowel disease, heart disease, non-alcoholic fatty liver disease, and depression.    Return in about 4 weeks (around 04/14/2022) for follow up on otezla .  IRuthell Rummage, CMA, am acting as scribe for Sarina Ser, MD. Documentation: I have reviewed the above documentation for accuracy and completeness, and I agree with the above.  Sarina Ser, MD

## 2022-03-17 NOTE — Patient Instructions (Signed)
Due to recent changes in healthcare laws, you may see results of your pathology and/or laboratory studies on MyChart before the doctors have had a chance to review them. We understand that in some cases there may be results that are confusing or concerning to you. Please understand that not all results are received at the same time and often the doctors may need to interpret multiple results in order to provide you with the best plan of care or course of treatment. Therefore, we ask that you please give us 2 business days to thoroughly review all your results before contacting the office for clarification. Should we see a critical lab result, you will be contacted sooner.   If You Need Anything After Your Visit  If you have any questions or concerns for your doctor, please call our main line at [phone] and press option 4 to reach your doctor's medical assistant. If no one answers, please leave a voicemail as directed and we will return your call as soon as possible. Messages left after 4 pm will be answered the following business day.   You may also send us a message via MyChart. We typically respond to MyChart messages within 1-2 business days.  For prescription refills, please ask your pharmacy to contact our office. Our fax number is [phone].  If you have an urgent issue when the clinic is closed that cannot wait until the next business day, you can page your doctor at the number below.    Please note that while we do our best to be available for urgent issues outside of office hours, we are not available 24/7.   If you have an urgent issue and are unable to reach us, you may choose to seek medical care at your doctor's office, retail clinic, urgent care center, or emergency room.  If you have a medical emergency, please immediately call 911 or go to the emergency department.  Pager Numbers  - Dr. Kowalski: [phone]  - Dr. Moye: [phone]  - Dr. Stewart:  [phone]  In the event of inclement weather, please call our main line at [phone] for an update on the status of any delays or closures.  Dermatology Medication Tips: Please keep the boxes that topical medications come in in order to help keep track of the instructions about where and how to use these. Pharmacies typically print the medication instructions only on the boxes and not directly on the medication tubes.   If your medication is too expensive, please contact our office at [phone] option 4 or send us a message through MyChart.   We are unable to tell what your co-pay for medications will be in advance as this is different depending on your insurance coverage. However, we may be able to find a substitute medication at lower cost or fill out paperwork to get insurance to cover a needed medication.   If a prior authorization is required to get your medication covered by your insurance company, please allow us 1-2 business days to complete this process.  Drug prices often vary depending on where the prescription is filled and some pharmacies may offer cheaper prices.  The website www.goodrx.com contains coupons for medications through different pharmacies. The prices here do not account for what the cost may be with help from insurance (it may be cheaper with your insurance), but the website can give you the price if you did not use any insurance.  - You can print the associated coupon and take it with   your prescription to the pharmacy.  - You may also stop by our office during regular business hours and pick up a GoodRx coupon card.  - If you need your prescription sent electronically to a different pharmacy, notify our office through [place] MyChart or by phone at [phone] option 4.     Si Usted Necesita Algo Despus de Su Visita  Tambin puede enviarnos un mensaje a travs de MyChart. Por lo general respondemos a los mensajes de MyChart en el transcurso de 1 a 2  das hbiles.  Para renovar recetas, por favor pida a su farmacia que se ponga en contacto con nuestra oficina. Nuestro nmero de fax es el [phone].  Si tiene un asunto urgente cuando la clnica est cerrada y que no puede esperar hasta el siguiente da hbil, puede llamar/localizar a su doctor(a) al nmero que aparece a continuacin.   Por favor, tenga en cuenta que aunque hacemos todo lo posible para estar disponibles para asuntos urgentes fuera del horario de oficina, no estamos disponibles las 24 horas del da, los 7 das de la semana.   Si tiene un problema urgente y no puede comunicarse con nosotros, puede optar por buscar atencin mdica  en el consultorio de su doctor(a), en una clnica privada, en un centro de atencin urgente o en una sala de emergencias.  Si tiene una emergencia mdica, por favor llame inmediatamente al 911 o vaya a la sala de emergencias.  Nmeros de bper  - Dr. Kowalski: [phone]  - Dra. Moye: [phone]  - Dra. Stewart: [phone]  En caso de inclemencias del tiempo, por favor llame a nuestra lnea principal al [phone] para una actualizacin sobre el estado de cualquier retraso o cierre.  Consejos para la medicacin en dermatologa: Por favor, guarde las cajas en las que vienen los medicamentos de uso tpico para ayudarle a seguir las instrucciones sobre dnde y cmo usarlos. Las farmacias generalmente imprimen las instrucciones del medicamento slo en las cajas y no directamente en los tubos del medicamento.   Si su medicamento es muy caro, por favor, pngase en contacto con nuestra oficina llamando al [phone] y presione la opcin 4 o envenos un mensaje a travs de MyChart.   No podemos decirle cul ser su copago por los medicamentos por adelantado ya que esto es diferente dependiendo de la cobertura de su seguro. Sin embargo, es posible que podamos encontrar un medicamento sustituto a menor costo o llenar un formulario para que el  seguro cubra el medicamento que se considera necesario.   Si se requiere una autorizacin previa para que su compaa de seguros cubra su medicamento, por favor permtanos de 1 a 2 das hbiles para completar este proceso.  Los precios de los medicamentos varan con frecuencia dependiendo del lugar de dnde se surte la receta y alguna farmacias pueden ofrecer precios ms baratos.  El sitio web www.goodrx.com tiene cupones para medicamentos de diferentes farmacias. Los precios aqu no tienen en cuenta lo que podra costar con la ayuda del seguro (puede ser ms barato con su seguro), pero el sitio web puede darle el precio si no utiliz ningn seguro.  - Puede imprimir el cupn correspondiente y llevarlo con su receta a la farmacia.  - Tambin puede pasar por nuestra oficina durante el horario de atencin regular y recoger una tarjeta de cupones de GoodRx.  - Si necesita que su receta se enve electrnicamente a una farmacia diferente, informe a nuestra oficina a travs de MyChart de [place]   o por telfono llamando al [phone] y presione la opcin 4.  

## 2022-03-19 ENCOUNTER — Encounter: Payer: Self-pay | Admitting: Dermatology

## 2022-03-22 ENCOUNTER — Ambulatory Visit: Payer: Medicaid Other | Admitting: Family Medicine

## 2022-03-24 ENCOUNTER — Ambulatory Visit: Payer: Medicaid Other | Admitting: Family Medicine

## 2022-03-29 ENCOUNTER — Emergency Department (HOSPITAL_COMMUNITY)
Admission: EM | Admit: 2022-03-29 | Discharge: 2022-03-29 | Disposition: A | Discharge status: Home / Self Care | Payer: Medicaid Other | Attending: Emergency Medicine | Admitting: Emergency Medicine

## 2022-03-29 ENCOUNTER — Other Ambulatory Visit: Payer: Self-pay

## 2022-03-29 ENCOUNTER — Emergency Department (HOSPITAL_COMMUNITY): Payer: Medicaid Other

## 2022-03-29 DIAGNOSIS — J029 Acute pharyngitis, unspecified: Secondary | ICD-10-CM | POA: Diagnosis not present

## 2022-03-29 DIAGNOSIS — R6884 Jaw pain: Secondary | ICD-10-CM | POA: Diagnosis not present

## 2022-03-29 DIAGNOSIS — K0889 Other specified disorders of teeth and supporting structures: Secondary | ICD-10-CM | POA: Diagnosis not present

## 2022-03-29 DIAGNOSIS — J392 Other diseases of pharynx: Secondary | ICD-10-CM | POA: Diagnosis not present

## 2022-03-29 DIAGNOSIS — J384 Edema of larynx: Secondary | ICD-10-CM | POA: Diagnosis not present

## 2022-03-29 DIAGNOSIS — Z20822 Contact with and (suspected) exposure to covid-19: Secondary | ICD-10-CM | POA: Diagnosis not present

## 2022-03-29 DIAGNOSIS — R1310 Dysphagia, unspecified: Secondary | ICD-10-CM | POA: Diagnosis not present

## 2022-03-29 LAB — CBC WITH DIFFERENTIAL/PLATELET
Abs Immature Granulocytes: 0.02 10*3/uL (ref 0.00–0.07)
Basophils Absolute: 0.1 10*3/uL (ref 0.0–0.1)
Basophils Relative: 1 %
Eosinophils Absolute: 0.1 10*3/uL (ref 0.0–0.5)
Eosinophils Relative: 1 %
HCT: 39.3 % (ref 36.0–46.0)
Hemoglobin: 13.6 g/dL (ref 12.0–15.0)
Immature Granulocytes: 0 %
Lymphocytes Relative: 19 %
Lymphs Abs: 1.7 10*3/uL (ref 0.7–4.0)
MCH: 31.6 pg (ref 26.0–34.0)
MCHC: 34.6 g/dL (ref 30.0–36.0)
MCV: 91.2 fL (ref 80.0–100.0)
Monocytes Absolute: 0.7 10*3/uL (ref 0.1–1.0)
Monocytes Relative: 8 %
Neutro Abs: 6.3 10*3/uL (ref 1.7–7.7)
Neutrophils Relative %: 71 %
Platelets: 187 10*3/uL (ref 150–400)
RBC: 4.31 MIL/uL (ref 3.87–5.11)
RDW: 12.1 % (ref 11.5–15.5)
WBC: 8.8 10*3/uL (ref 4.0–10.5)
nRBC: 0 % (ref 0.0–0.2)

## 2022-03-29 LAB — GROUP A STREP BY PCR: Group A Strep by PCR: NOT DETECTED

## 2022-03-29 LAB — BASIC METABOLIC PANEL
Anion gap: 10 (ref 5–15)
BUN: 7 mg/dL (ref 6–20)
CO2: 22 mmol/L (ref 22–32)
Calcium: 9.4 mg/dL (ref 8.9–10.3)
Chloride: 106 mmol/L (ref 98–111)
Creatinine, Ser: 0.52 mg/dL (ref 0.44–1.00)
GFR, Estimated: >60 mL/min (ref >60)
Glucose, Bld: 93 mg/dL (ref 70–99)
Potassium: 3.7 mmol/L (ref 3.5–5.1)
Sodium: 138 mmol/L (ref 135–145)

## 2022-03-29 LAB — SARS CORONAVIRUS 2 BY RT PCR: SARS Coronavirus 2 by RT PCR: NEGATIVE

## 2022-03-29 MED ORDER — ALUM & MAG HYDROXIDE-SIMETH 200-200-20 MG/5ML PO SUSP
30.0000 mL | Freq: Once | ORAL | Status: AC
  Administered 2022-03-29: 30 mL via ORAL
  Filled 2022-03-29: qty 30

## 2022-03-29 MED ORDER — DEXAMETHASONE 10 MG/ML FOR PEDIATRIC ORAL USE: 10.0000 mg | Freq: Once | INTRAMUSCULAR | Status: DC

## 2022-03-29 MED ORDER — KETOROLAC TROMETHAMINE 15 MG/ML IJ SOLN
15.0000 mg | Freq: Once | INTRAMUSCULAR | Status: AC
  Administered 2022-03-29: 15 mg via INTRAVENOUS
  Filled 2022-03-29: qty 1

## 2022-03-29 MED ORDER — LIDOCAINE VISCOUS HCL 2 % MT SOLN
15.0000 mL | Freq: Once | OROMUCOSAL | Status: AC
  Administered 2022-03-29: 15 mL via OROMUCOSAL
  Filled 2022-03-29: qty 15

## 2022-03-29 MED ORDER — SODIUM CHLORIDE 0.9 % IV BOLUS
500.0000 mL | Freq: Once | INTRAVENOUS | Status: AC
  Administered 2022-03-29: 500 mL via INTRAVENOUS

## 2022-03-29 MED ORDER — IOHEXOL 300 MG/ML  SOLN
100.0000 mL | Freq: Once | INTRAMUSCULAR | Status: AC | PRN
Start: 2022-03-29 — End: 2022-03-29
  Administered 2022-03-29: 75 mL via INTRAVENOUS

## 2022-03-29 MED ORDER — DEXAMETHASONE SODIUM PHOSPHATE 10 MG/ML IJ SOLN
10.0000 mg | Freq: Once | INTRAMUSCULAR | Status: AC
Start: 2022-03-29 — End: 2022-03-29
  Administered 2022-03-29: 10 mg via INTRAVENOUS
  Filled 2022-03-29: qty 1

## 2022-03-29 MED ORDER — LIDOCAINE VISCOUS HCL 2 % MT SOLN: 15.0000 mL | OROMUCOSAL | 0 refills | Status: AC | PRN

## 2022-03-29 NOTE — Discharge Instructions (Signed)
Please drink plenty of fluids. I have prescribed you more of the lidocaine solution that will help your pain. Your strep throat and COVID returned negative. This is likely a virus that will improve on it's own.  Please see attached instructions for home care recommendations.

## 2022-03-29 NOTE — ED Provider Triage Note (Signed)
Emergency Medicine Provider Triage Evaluation Note  Annette Chang , a 51 y.o. female  was evaluated in triage.  Pt complains of dental pain starting yesterday.  She reports pain left lower jaw with radiation to the ear.  However she also describes difficulty and painful swallowing, unable to swallow water, and voice change.  Temperature 99.5 degrees on arrival.  Review of Systems  Positive: Dental pain, dysphagia Negative: Fever  Physical Exam  BP 125/81 (BP Location: Right Arm)   Pulse 69   Temp 99.5 F (37.5 C) (Oral)   Resp 16   Ht '5\' 1"'$  (1.549 m)   Wt 45.4 kg   SpO2 98%   BMI 18.89 kg/m  Gen:   Awake, no distress   Resp:  Normal effort  MSK:   Moves extremities without difficulty  Other:  ENT patient with raspy voice, there is some fullness just posterior to the left lower posterior molar.  Patient does have some trismus.  She is handling her secretions.  No peritonsillar abscess noted.  Medical Decision Making  Medically screening exam initiated at 10:48 AM.  Appropriate orders placed.  Annette Chang was informed that the remainder of the evaluation will be completed by another provider, this initial triage assessment does not replace that evaluation, and the importance of remaining in the ED until their evaluation is complete.  Patient with what appears to be a dental infection, however concerning exam with voice changes and trismus.  Will obtain CT imaging to rule out deep space tissue infection and neck.   Carlisle Cater, PA-C 03/29/22 1050

## 2022-03-29 NOTE — ED Notes (Signed)
Pt c/o difficulty swallowing due to the increased pain. Pain increases with hot/cold beverages.

## 2022-03-29 NOTE — ED Triage Notes (Signed)
Pt. Stated, bottom right molar is in pain and moves to my ear.

## 2022-03-29 NOTE — ED Provider Notes (Signed)
[facility] EMERGENCY DEPARTMENT Provider Note   CSN: [id number] Arrival date & time: 03/29/22  1011     History PMH: Depression, Anxiety, IDA Chief Complaint  Patient presents with   Dental Pain    Annette Chang is a 51 y.o. female.  Patient presents the ED with a chief complaint of left lower molar pain.  She states that yesterday she started having pain in the for this back left lower molar.  She says that she when she woke up this morning she noticed that she had a sore throat and difficulty swallowing. Pain radiates to her left ear.  She states that her voice has become really hoarse and that she is having difficulty opening her mouth.  She has not ate or drank anything today due to throat pain.  Denies cough, fever, congestion, chest pain, and shortness of breath.  Dental Pain Associated symptoms: no congestion and no fever        Home Medications Prior to Admission medications   Medication Sig Start Date End Date Taking? Authorizing Provider  lidocaine (XYLOCAINE) 2 % solution Use as directed 15 mLs in the mouth or throat as needed for up to 5 days for mouth pain. 03/29/22 04/03/22 Yes Daphanie Oquendo, Adora Fridge, PA-C  alendronate (FOSAMAX) 70 MG tablet Take 1 tablet (70 mg total) by mouth every 7 (seven) days. Take with a full glass of water on an empty stomach. 09/28/21   [name], DO  calcium carbonate (TUMS - DOSED IN MG ELEMENTAL CALCIUM) 500 MG chewable tablet Chew 1 tablet by mouth daily as needed for indigestion or heartburn.    [provider]  Calcium Carbonate Antacid (ALKA-SELTZER ANTACID PO) Take 1 tablet by mouth daily as needed (gas, flatulance).    [provider]  Calcium-Vitamin D-Vitamin K 500-500-40 MG-UNT-MCG CHEW Chew 1 tablet by mouth daily.    [provider]  ketorolac (ACULAR) 0.4 % SOLN Place 2 drops into the left eye daily. 01/08/21   [provider]  methocarbamol (ROBAXIN) 500 MG tablet Take 1  tablet (500 mg total) by mouth every 8 (eight) hours as needed for muscle spasms. 06/15/21   Magnant, Charles L, PA-C  nicotine polacrilex (RA NICOTINE GUM) 2 MG gum Take 1 each (2 mg total) by mouth as needed for smoking cessation. 08/17/21   [name], DO  omeprazole (PRILOSEC) 20 MG capsule TAKE ONE CAPSULE BY MOUTH EVERY DAY 02/14/22   Zola Button, MD  sertraline (ZOLOFT) 50 MG tablet TAKE ONE TABLET BY MOUTH DAILY 01/18/22   [name], DO  traMADol (ULTRAM) 50 MG tablet Take 1 tablet (50 mg total) by mouth 2 (two) times daily. 08/25/21   Meredith Pel, MD  vitamin C (ASCORBIC ACID) 500 MG tablet Take 500 mg by mouth daily.     [provider]      Allergies    Percocet [oxycodone-acetaminophen] and Robaxin [methocarbamol]    Review of Systems   Review of Systems  Constitutional:  Positive for chills. Negative for fever.  HENT:  Positive for dental problem, ear pain, sore throat, trouble swallowing and voice change. Negative for congestion and ear discharge.   Respiratory:  Negative for cough and shortness of breath.   Cardiovascular:  Negative for chest pain.  All other systems reviewed and are negative.   Physical Exam Updated Vital Signs BP 122/77   Pulse (!) 56   Temp 97.9 F (36.6 C)   Resp 16   Ht  $'5\' 1"'d$  (1.549 m)   Wt 45.4 kg   SpO2 100%   BMI 18.89 kg/m  Physical Exam Vitals and nursing note reviewed.  Constitutional:      General: She is not in acute distress.    Appearance: Normal appearance. She is well-developed. She is not ill-appearing, toxic-appearing or diaphoretic.  HENT:     Head: Normocephalic and atraumatic.     Right Ear: Tympanic membrane, ear canal and external ear normal. There is no impacted cerumen.     Left Ear: Tympanic membrane, ear canal and external ear normal. There is no impacted cerumen.     Nose: Nose normal. No nasal deformity, congestion or rhinorrhea.     Mouth/Throat:     Lips: Pink. No lesions.     Mouth: Mucous  membranes are dry.     Dentition: Dental tenderness present.     Pharynx: Oropharynx is clear. Uvula midline. Posterior oropharyngeal erythema present. No pharyngeal swelling, oropharyngeal exudate or uvula swelling.     Tonsils: No tonsillar exudate or tonsillar abscesses. 1+ on the right. 2+ on the left.   Eyes:     General: Gaze aligned appropriately. No scleral icterus.       Right eye: No discharge.        Left eye: No discharge.     Conjunctiva/sclera: Conjunctivae normal.     Right eye: Right conjunctiva is not injected. No exudate or hemorrhage.    Left eye: Left conjunctiva is not injected. No exudate or hemorrhage. Neck:     Comments: Anterior cervical lymphadenopathy Cardiovascular:     Rate and Rhythm: Normal rate and regular rhythm.     Pulses: Normal pulses.     Heart sounds: Normal heart sounds. No murmur heard.    No friction rub. No gallop.  Pulmonary:     Effort: Pulmonary effort is normal. No respiratory distress.     Breath sounds: Normal breath sounds. No stridor. No wheezing, rhonchi or rales.  Chest:     Chest wall: No tenderness.  Abdominal:     General: Abdomen is flat. There is no distension.     Palpations: Abdomen is soft.     Tenderness: There is no abdominal tenderness. There is no guarding or rebound.  Musculoskeletal:     Right lower leg: No edema.     Left lower leg: No edema.  Lymphadenopathy:     Cervical: Cervical adenopathy present.  Skin:    General: Skin is warm and dry.  Neurological:     Mental Status: She is alert and oriented to person, place, and time.  Psychiatric:        Mood and Affect: Mood normal.        Speech: Speech normal.        Behavior: Behavior normal. Behavior is cooperative.     ED Results / Procedures / Treatments   Labs (all labs ordered are listed, but only abnormal results are displayed) Labs Reviewed  GROUP A STREP BY PCR  SARS CORONAVIRUS 2 BY RT PCR  CBC WITH DIFFERENTIAL/PLATELET  BASIC METABOLIC  PANEL    EKG None  Radiology CT Soft Tissue Neck W Contrast  Result Date: 03/29/2022 CLINICAL DATA:  Pain in left lower jaw radiating to the ear with difficulty and painful swallowing. EXAM: CT NECK WITH CONTRAST TECHNIQUE: Multidetector CT imaging of the neck was performed using the standard protocol following the bolus administration of intravenous contrast. RADIATION DOSE REDUCTION: This exam was performed according to the  departmental dose-optimization program which includes automated exposure control, adjustment of the mA and/or kV according to patient size and/or use of iterative reconstruction technique. CONTRAST:  44m OMNIPAQUE IOHEXOL 300 MG/ML  SOLN COMPARISON:  None Available. FINDINGS: Pharynx and larynx: The nasal cavity and nasopharynx are unremarkable. There is mild mucosal edema along the left lateral oropharyngeal wall (5-25) extending inferiorly (5-36). There is no evidence of abscess formation. The oral cavity and oropharynx are otherwise unremarkable. No significant dental disease is identified. The hypopharynx and larynx are unremarkable. The airway is patent the epiglottis is normal There is no retropharyngeal fluid collection. Salivary glands: The parotid and submandibular glands are unremarkable. Thyroid: Unremarkable. Lymph nodes: There is no pathologic lymphadenopathy in the neck. Vascular: The major vasculature of the neck is unremarkable. Limited intracranial: The imaged portions of the intracranial compartment are unremarkable. Visualized orbits: The globes and orbits are unremarkable. Mastoids and visualized paranasal sinuses: Clear. Skeleton: There is degenerative change of the cervical spine most advanced at C5-C6. There is no acute osseous abnormality or suspicious osseous lesion. Upper chest: There is mild centrilobular emphysema in the lung apices. Other: None. IMPRESSION: 1. Mild mucosal edema along the left lateral oropharyngeal wall may reflect pharyngitis. No evidence  of abscess formation. Patent airway. No significant dental disease identified. 2. Mild emphysema in the lung apices. Electronically Signed   By: PValetta MoleM.D.   On: 03/29/2022 13:55    Procedures Procedures   Medications Ordered in ED Medications  iohexol (OMNIPAQUE) 300 MG/ML solution 100 mL (75 mLs Intravenous Contrast Given 03/29/22 1325)  sodium chloride 0.9 % bolus 500 mL (0 mLs Intravenous Stopped 03/29/22 1758)  alum & mag hydroxide-simeth (MAALOX/MYLANTA) 200-200-20 MG/5ML suspension 30 mL (30 mLs Oral Given 03/29/22 1627)  dexamethasone (DECADRON) injection 10 mg (10 mg Intravenous Given 03/29/22 1627)  lidocaine (XYLOCAINE) 2 % viscous mouth solution 15 mL (15 mLs Mouth/Throat Given 03/29/22 1627)  ketorolac (TORADOL) 15 MG/ML injection 15 mg (15 mg Intravenous Given 03/29/22 1627)    ED Course/ Medical Decision Making/ A&P                           Medical Decision Making Risk OTC drugs. Prescription drug management.    MDM  This is a 51y.o. female who presents to the ED with sore throat and dental pain The differential of this patient includes but is not limited to PTA, Ludwigs Angina, RPA, Epiglottitis, Strep throat, Viral Pharyngitis.   Initial Impression  Well appearing, no acute distress. Afebrile. Stable vitals. She does have hoarse voice with some mild trismus, but I am able to open jaw completely. The tooth in question is tender to the touch, but no obvious decay or abnormality is noted. She does not have sign of PTA on exam. Airway intact without any stridor. Initial CT scan was ordered and triage and completed prior to my assessment of the patient.   I personally ordered, reviewed, and interpreted all laboratory work and imaging and agree with radiologist interpretation. Results interpreted below: No leukocytosis.  BMP unremarkable.  Negative for COVID, and strep pharyngitis CT soft tissue neck: Reveals mild mucosal edema along the left lateral oropharyngeal wall  that reflects likely pharyngitis.  There is no evidence of abscess formation.  Airway is patent.  No significant dental disease identified.  Assessment/Plan:  Patient does not have any evidence of a deep space infection on imaging.  She does have mild trismus and hoarse  voice.  I have given her 10 mg of IV Decadron as well as discussed lidocaine for her pain.  At [place], I reassessed this patient and she is significantly improved.  She was able to tolerate fluids without difficulty.  Her throat pain is much better and she feels like she can swallow now.  She continues to have no stridor or airway involvement.  I have low suspicion that this is a bacterial illness and antibiotics not indicated.  No evidence of any dental infection either.  I considered admission, however since patient is able to tolerate p.o. there is no indication for admission at this time.  I have recommended supportive treatments at home.  She is given return precautions.   Charting Requirements Additional history is obtained from:  Independent historian External Records from outside source obtained and reviewed including: Recent office visit August 24 for her dermatologist.  Current Symptoms do not seem to be present at that time. Social Determinants of Health:  none Pertinant PMH that complicates patient's illness: n/a  Patient Care Problems that were addressed during this visit: - Acute Pharyngitis: Acute illness with complication This patient was maintained on a cardiac monitor/telemetry. I personally viewed and interpreted the cardiac monitor which reveals an underlying rhythm of NSR Medications given in ED: Decadron, Viscous Lidocaine/GI cocktail, IVF Reevaluation of the patient after these medicines showed that the patient improved I have reviewed home medications and made changes accordingly.  Critical Care Interventions: n/a Consultations: n/a Disposition: discharge  This is a supervised visit with my attending  physician, Dr. Darl Householder. We have discussed this patient and they have altered the plan as needed.  Portions of this note were generated with [employer]. Dictation errors may occur despite best attempts at proofreading.    Final Clinical Impression(s) / ED Diagnoses Final diagnoses:  Acute pharyngitis, unspecified etiology    Rx / DC Orders ED Discharge Orders          Ordered    lidocaine (XYLOCAINE) 2 % solution  As needed        03/29/22 1850              Annette Chang, Adora Fridge, PA-C 03/29/22 1856    Drenda Freeze, MD 03/29/22 2337

## 2022-04-12 ENCOUNTER — Telehealth: Payer: Self-pay | Admitting: Orthopedic Surgery

## 2022-04-12 NOTE — Telephone Encounter (Signed)
Yes  thx

## 2022-04-12 NOTE — Telephone Encounter (Signed)
Patient called asking for a letter to her apartment complex stating that she is handicap. She said she has gotten him to write letter for this in the past. She is needing it by this Friday if possible. CB # [phone]

## 2022-04-13 NOTE — Telephone Encounter (Signed)
Letter generated. Tried calling to advise. Went straight to VM but unable to LM. VM is full

## 2022-04-14 ENCOUNTER — Ambulatory Visit (INDEPENDENT_AMBULATORY_CARE_PROVIDER_SITE_OTHER): Payer: Medicaid Other | Admitting: Dermatology

## 2022-04-14 ENCOUNTER — Telehealth: Payer: Self-pay | Admitting: Orthopedic Surgery

## 2022-04-14 DIAGNOSIS — L409 Psoriasis, unspecified: Secondary | ICD-10-CM

## 2022-04-14 DIAGNOSIS — Z79899 Other long term (current) drug therapy: Secondary | ICD-10-CM

## 2022-04-14 MED ORDER — OTEZLA 30 MG PO TABS: 30.0000 mg | ORAL_TABLET | Freq: Two times a day (BID) | ORAL | 12 refills | Status: AC

## 2022-04-14 MED ORDER — OTEZLA 30 MG PO TABS: 30.0000 mg | ORAL_TABLET | Freq: Two times a day (BID) | ORAL | 3 refills | Status: DC

## 2022-04-14 NOTE — Telephone Encounter (Signed)
Spoke with patient advised letter is up front for pick up

## 2022-04-14 NOTE — Patient Instructions (Signed)
Due to recent changes in healthcare laws, you may see results of your pathology and/or laboratory studies on MyChart before the doctors have had a chance to review them. We understand that in some cases there may be results that are confusing or concerning to you. Please understand that not all results are received at the same time and often the doctors may need to interpret multiple results in order to provide you with the best plan of care or course of treatment. Therefore, we ask that you please give us 2 business days to thoroughly review all your results before contacting the office for clarification. Should we see a critical lab result, you will be contacted sooner.   If You Need Anything After Your Visit  If you have any questions or concerns for your doctor, please call our main line at [phone] and press option 4 to reach your doctor's medical assistant. If no one answers, please leave a voicemail as directed and we will return your call as soon as possible. Messages left after 4 pm will be answered the following business day.   You may also send us a message via MyChart. We typically respond to MyChart messages within 1-2 business days.  For prescription refills, please ask your pharmacy to contact our office. Our fax number is [phone].  If you have an urgent issue when the clinic is closed that cannot wait until the next business day, you can page your doctor at the number below.    Please note that while we do our best to be available for urgent issues outside of office hours, we are not available 24/7.   If you have an urgent issue and are unable to reach us, you may choose to seek medical care at your doctor's office, retail clinic, urgent care center, or emergency room.  If you have a medical emergency, please immediately call 911 or go to the emergency department.  Pager Numbers  - Dr. Kowalski: [phone]  - Dr. Moye: [phone]  - Dr. Stewart:  [phone]  In the event of inclement weather, please call our main line at [phone] for an update on the status of any delays or closures.  Dermatology Medication Tips: Please keep the boxes that topical medications come in in order to help keep track of the instructions about where and how to use these. Pharmacies typically print the medication instructions only on the boxes and not directly on the medication tubes.   If your medication is too expensive, please contact our office at [phone] option 4 or send us a message through MyChart.   We are unable to tell what your co-pay for medications will be in advance as this is different depending on your insurance coverage. However, we may be able to find a substitute medication at lower cost or fill out paperwork to get insurance to cover a needed medication.   If a prior authorization is required to get your medication covered by your insurance company, please allow us 1-2 business days to complete this process.  Drug prices often vary depending on where the prescription is filled and some pharmacies may offer cheaper prices.  The website www.goodrx.com contains coupons for medications through different pharmacies. The prices here do not account for what the cost may be with help from insurance (it may be cheaper with your insurance), but the website can give you the price if you did not use any insurance.  - You can print the associated coupon and take it with   your prescription to the pharmacy.  - You may also stop by our office during regular business hours and pick up a GoodRx coupon card.  - If you need your prescription sent electronically to a different pharmacy, notify our office through [facility] MyChart or by phone at [phone] option 4.     Si Usted Necesita Algo Despus de Su Visita  Tambin puede enviarnos un mensaje a travs de MyChart. Por lo general respondemos a los mensajes de MyChart en el transcurso de 1 a 2  das hbiles.  Para renovar recetas, por favor pida a su farmacia que se ponga en contacto con nuestra oficina. Nuestro nmero de fax es el [phone].  Si tiene un asunto urgente cuando la clnica est cerrada y que no puede esperar hasta el siguiente da hbil, puede llamar/localizar a su doctor(a) al nmero que aparece a continuacin.   Por favor, tenga en cuenta que aunque hacemos todo lo posible para estar disponibles para asuntos urgentes fuera del horario de oficina, no estamos disponibles las 24 horas del da, los 7 das de la semana.   Si tiene un problema urgente y no puede comunicarse con nosotros, puede optar por buscar atencin mdica  en el consultorio de su doctor(a), en una clnica privada, en un centro de atencin urgente o en una sala de emergencias.  Si tiene una emergencia mdica, por favor llame inmediatamente al 911 o vaya a la sala de emergencias.  Nmeros de bper  - Dr. Kowalski: [phone]  - Dra. Moye: [phone]  - Dra. Stewart: [phone]  En caso de inclemencias del tiempo, por favor llame a nuestra lnea principal al [phone] para una actualizacin sobre el estado de cualquier retraso o cierre.  Consejos para la medicacin en dermatologa: Por favor, guarde las cajas en las que vienen los medicamentos de uso tpico para ayudarle a seguir las instrucciones sobre dnde y cmo usarlos. Las farmacias generalmente imprimen las instrucciones del medicamento slo en las cajas y no directamente en los tubos del medicamento.   Si su medicamento es muy caro, por favor, pngase en contacto con nuestra oficina llamando al [phone] y presione la opcin 4 o envenos un mensaje a travs de MyChart.   No podemos decirle cul ser su copago por los medicamentos por adelantado ya que esto es diferente dependiendo de la cobertura de su seguro. Sin embargo, es posible que podamos encontrar un medicamento sustituto a menor costo o llenar un formulario para que el  seguro cubra el medicamento que se considera necesario.   Si se requiere una autorizacin previa para que su compaa de seguros cubra su medicamento, por favor permtanos de 1 a 2 das hbiles para completar este proceso.  Los precios de los medicamentos varan con frecuencia dependiendo del lugar de dnde se surte la receta y alguna farmacias pueden ofrecer precios ms baratos.  El sitio web www.goodrx.com tiene cupones para medicamentos de diferentes farmacias. Los precios aqu no tienen en cuenta lo que podra costar con la ayuda del seguro (puede ser ms barato con su seguro), pero el sitio web puede darle el precio si no utiliz ningn seguro.  - Puede imprimir el cupn correspondiente y llevarlo con su receta a la farmacia.  - Tambin puede pasar por nuestra oficina durante el horario de atencin regular y recoger una tarjeta de cupones de GoodRx.  - Si necesita que su receta se enve electrnicamente a una farmacia diferente, informe a nuestra oficina a travs de MyChart de    o por telfono llamando al [phone] y presione la opcin 4.  

## 2022-04-14 NOTE — Progress Notes (Signed)
   Follow-Up Visit   Subjective  Annette Chang is a 51 y.o. female who presents for the following: Psoriasis (1 month follow up - Otezla 30 mg 1 po qd - had some GI upset at first but it has gotten better).  The following portions of the chart were reviewed this encounter and updated as appropriate:   Tobacco  Allergies  Meds  Problems  Med Hx  Surg Hx  Fam Hx     Review of Systems:  No other skin or systemic complaints except as noted in HPI or Assessment and Plan.  Objective  Well appearing patient in no apparent distress; mood and affect are within normal limits.  A focused examination was performed including hands, fingernails. Relevant physical exam findings are noted in the Assessment and Plan.   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  Chronic and persistent condition with duration or expected duration over one year. Condition is symptomatic / bothersome to patient. Not to goal.  Psoriasis is a chronic non-curable, but treatable genetic/hereditary disease that may have other systemic features affecting other organ systems such as joints (Psoriatic Arthritis). It is associated with an increased risk of inflammatory bowel disease, heart disease, non-alcoholic fatty liver disease, and depression.    Will plan to increase Otezla 30 mg 1 po bid - advised patient to titrate evening dose up to 30 mg - sample packs given today  Apremilast (OTEZLA) 30 MG TABS Take 1 tablet (30 mg total) by mouth 2 (two) times daily.  Return in about 2 months (around 06/14/2022) for Psoriasis.  I, Ashok Cordia, CMA, am acting as scribe for Sarina Ser, MD . Documentation: I have reviewed the above documentation for accuracy and completeness, and I agree with the above.  Sarina Ser, MD

## 2022-04-19 ENCOUNTER — Encounter: Payer: Self-pay | Admitting: Dermatology

## 2022-05-03 DIAGNOSIS — R6889 Other general symptoms and signs: Secondary | ICD-10-CM | POA: Diagnosis not present

## 2022-05-04 DIAGNOSIS — R6889 Other general symptoms and signs: Secondary | ICD-10-CM | POA: Diagnosis not present

## 2022-05-05 DIAGNOSIS — R6889 Other general symptoms and signs: Secondary | ICD-10-CM | POA: Diagnosis not present

## 2022-05-06 DIAGNOSIS — R6889 Other general symptoms and signs: Secondary | ICD-10-CM | POA: Diagnosis not present

## 2022-05-09 ENCOUNTER — Other Ambulatory Visit: Payer: Self-pay

## 2022-05-09 DIAGNOSIS — R6889 Other general symptoms and signs: Secondary | ICD-10-CM | POA: Diagnosis not present

## 2022-05-09 DIAGNOSIS — M810 Age-related osteoporosis without current pathological fracture: Secondary | ICD-10-CM

## 2022-05-09 MED ORDER — ALENDRONATE SODIUM 70 MG PO TABS: 70.0000 mg | ORAL_TABLET | ORAL | 4 refills | Status: DC

## 2022-05-10 DIAGNOSIS — R6889 Other general symptoms and signs: Secondary | ICD-10-CM | POA: Diagnosis not present

## 2022-05-11 DIAGNOSIS — R6889 Other general symptoms and signs: Secondary | ICD-10-CM | POA: Diagnosis not present

## 2022-05-12 DIAGNOSIS — R6889 Other general symptoms and signs: Secondary | ICD-10-CM | POA: Diagnosis not present

## 2022-05-13 DIAGNOSIS — R6889 Other general symptoms and signs: Secondary | ICD-10-CM | POA: Diagnosis not present

## 2022-05-16 DIAGNOSIS — R6889 Other general symptoms and signs: Secondary | ICD-10-CM | POA: Diagnosis not present

## 2022-05-17 DIAGNOSIS — R6889 Other general symptoms and signs: Secondary | ICD-10-CM | POA: Diagnosis not present

## 2022-05-18 DIAGNOSIS — R6889 Other general symptoms and signs: Secondary | ICD-10-CM | POA: Diagnosis not present

## 2022-05-19 DIAGNOSIS — R6889 Other general symptoms and signs: Secondary | ICD-10-CM | POA: Diagnosis not present

## 2022-05-20 DIAGNOSIS — R6889 Other general symptoms and signs: Secondary | ICD-10-CM | POA: Diagnosis not present

## 2022-05-26 NOTE — Progress Notes (Signed)
Office Visit Note  Patient: Annette Chang             Date of Birth: 07/25/1971           MRN: [id number]             PCP: Zola Button, MD Referring: Ralene Bathe, MD Visit Date: 06/09/2022 Occupation: '@GUAROCC'$ @  Subjective:  Pain in joints and psoriasis  History of Present Illness: Annette Chang is a 51 y.o. female seen in consultation per request of Dr. Nehemiah Massed, her dermatologist.  According the patient she has had right arm pain and numbness for many years.  She was seen by neurologist initially and had nerve conduction velocities.  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  She was referred to Dr. Marlou Sa.  She underwent right rotator cuff tear repair in 2013.  In 2018 she had muscle repair.  In 2019 she had surgery for long thoracic nerve entrapment.  She states she has limited range of motion of her right shoulder joint and chronic discomfort.  She also has discomfort in her left shoulder joint.  She is followed by Dr. Marlou Sa closely for her shoulder joint pain and discomfort.  None of the other joints are painful.  She has had some lower back pain in the past.  She had x-rays which were unremarkable.  She started seeing Dr. Nehemiah Massed.  2023.  At the time she was found to have psoriasis and fingernail changes.  She was started on [place] which she gradually increased to 30 mg p.o. twice daily.  She has noticed weight loss and headaches on Otezla.  She denies any psoriasis rash.  Denies any history of dactylitis, Achilles tendinitis or planter fasciitis.  Activities of Daily Living:  Patient reports morning stiffness for 30 minutes.   Patient Denies nocturnal pain.  Difficulty dressing/grooming: Reports Difficulty climbing stairs: Reports Difficulty getting out of chair: Reports Difficulty using hands for taps, buttons, cutlery, and/or writing: Reports  Review of Systems  Constitutional:  Negative for fatigue.  HENT:  Negative for mouth sores and mouth  dryness.   Eyes:  Negative for dryness.  Respiratory:  Negative for difficulty breathing.   Cardiovascular:  Negative for chest pain and palpitations.  Gastrointestinal:  Negative for blood in stool, constipation and diarrhea.  Endocrine: Negative for increased urination.  Genitourinary:  Negative for difficulty urinating.  Musculoskeletal:  Positive for joint pain, gait problem, joint pain, myalgias, muscle weakness, morning stiffness, muscle tenderness and myalgias. Negative for joint swelling.  Skin:  Negative for color change, rash, hair loss and sensitivity to sunlight.  Allergic/Immunologic: Negative for susceptible to infections.  Neurological:  Positive for dizziness and headaches.  Hematological:  Negative for swollen glands.  Psychiatric/Behavioral:  Positive for depressed mood. Negative for sleep disturbance. The patient is nervous/anxious.     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   Chronic urticaria 08/06/2015   H/O rotator cuff surgery 01/02/2012   Arthritis of shoulder region, 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   Iron deficiency anemia 01/02/2012    Past Medical History:  Diagnosis Date   Anxiety    Arthritis of shoulder region,  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   Blood transfusion without reported diagnosis    Depression    H/O rotator cuff surgery 01/02/2012   H/O tubal ligation 01/02/2012   H/O: C-section 01/02/2012   3    H/O: C-section 01/02/2012   3    Headache(784.0) 01/02/2012   Iron deficiency anemia 01/02/2012   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    Family History  Problem Relation Age of Onset   Diabetes Maternal Grandmother     Diabetes Paternal Grandmother    Diabetes Paternal Grandfather    Colon cancer Neg Hx    Esophageal cancer Neg Hx    Rectal cancer Neg Hx    Stomach cancer Neg Hx    Past Surgical History:  Procedure Laterality Date   CESAREAN SECTION     3 previous c sections   EYE SURGERY Bilateral    MUSCLE REPAIR Right 01/10/2017   RIGHT PECTORAL MAJOR TO SCAPULA MUSCLE TRANSFER (Right   NOVASURE ABLATION N/A 03/11/2014   Procedure: NOVASURE ABLATION;  Surgeon: Emily Filbert, MD;  Location: [facility];  Service: Gynecology;  Laterality: N/A;   PECTORALIS TENDON REPAIR Right 01/10/2017   Procedure: RIGHT PECTORAL MAJOR TO SCAPULA MUSCLE TRANSFER;  Surgeon: Meredith Pel, MD;  Location: [facility];  Service: Orthopedics;  Laterality: Right;   PECTORALIS TENDON REPAIR Right 12/28/2017   Procedure: RIGHT REVISION TENDON TRANSFER OF PECTORALIS MAJOR;  Surgeon: Meredith Pel, MD;  Location: [facility];  Service: Orthopedics;  Laterality: Right;   rotator cuff surgery     SHOULDER SURGERY     TUBAL LIGATION     Social History   Social History Narrative   Patient does not drink caffeine.   Patient is right handed.       Current Social History   (Please include date ( . td) when updating information )      Who lives at home: husband of 71 years and 36 yr old daughter 08/24/2016    Transportation: car 08/24/2016   Important Relationships & Pets: neighbors 08/24/2016    Current Stressors: only income is from husband's job 08/24/2016   Work / Education:  Unable to work due to shoulder 08/24/2016   Religious / Personal Beliefs: not assessed 08/24/2016   Interests / Fun: working to get out more 08/24/2016   Other: going to [place] to establish mental health provider  08/24/2016                                                                                                      Immunization History  Administered Date(s) Administered   Influenza,inj,Quad PF,6+ Mos 08/06/2015   PFIZER Comirnaty(Gray  Top)Covid-19 Tri-Sucrose Vaccine 11/27/2020   PFIZER(Purple Top)SARS-COV-2 Vaccination 05/24/2020, 06/26/2020   Pfizer Covid-19 Vaccine Bivalent Booster 72yr & up 06/02/2021   Tdap 01/02/2012     Objective: Vital Signs: BP 134/88 (BP Location: Right Arm, Patient Position: Sitting, Cuff Size: Normal)   Pulse 60   Resp 17   Ht 5' 3.5" (1.613 m)   Wt 87 lb 6.4 oz (39.6 kg)  BMI 15.24 kg/m    Physical Exam Vitals and nursing note reviewed.  Constitutional:      Appearance: She is well-developed.  HENT:     Head: Normocephalic and atraumatic.  Eyes:     Conjunctiva/sclera: Conjunctivae normal.  Cardiovascular:     Rate and Rhythm: Normal rate and regular rhythm.     Heart sounds: Normal heart sounds.  Pulmonary:     Effort: Pulmonary effort is normal.     Breath sounds: Normal breath sounds.  Abdominal:     General: Bowel sounds are normal.     Palpations: Abdomen is soft.  Musculoskeletal:     Cervical back: Normal range of motion.  Lymphadenopathy:     Cervical: No cervical adenopathy.  Skin:    General: Skin is warm and dry.     Capillary Refill: Capillary refill takes less than 2 seconds.     Comments: Nail dystrophy was noted in her fingernails.  Hyperpigmentation was noted over right elbow.  Neurological:     Mental Status: She is alert and oriented to person, place, and time.  Psychiatric:        Behavior: Behavior normal.      Musculoskeletal Exam: Cervical spine, thoracic and lumbar spine were in good range of motion.  Right shoulder joint abduction was limited to 30 degrees and forward flexion about 40 degrees.  Left shoulder joint abduction forward flexion was limited to 90 degrees.  No warmth swelling or effusion was noted.Her elbow joints, wrist joints, MCPs PIPs and DIPs been good range of motion with no synovitis.  PIP and DIP thickening was noted.  Hip joints were in good range of motion.  She had some discomfort over left trochanteric bursa.  Knee joints  with good range of motion without any warmth swelling or effusion.  There was no tenderness over ankles or MTPs.  No dactylitis, Planter fasciitis or Achilles tendinitis was noted.  CDAI Exam: CDAI Score: -- Patient Global: --; Provider Global: -- Swollen: --; Tender: -- Joint Exam 06/09/2022   No joint exam has been documented for this visit   There is currently no information documented on the homunculus. Go to the Rheumatology activity and complete the homunculus joint exam.  Investigation: No additional findings.  Imaging: XR Foot 2 Views Left  Result Date: 06/09/2022 First MTP narrowing and subluxation was noted.  Mild PIP and DIP narrowing was noted.  No intertarsal, tibiotalar or subtalar narrowing was noted.  No erosive changes were noted. Impression: These findings are consistent with osteoarthritis of the foot.  XR Foot 2 Views Right  Result Date: 06/09/2022 Hallux valgus deformity with first MTP narrowing was noted.  Mild PIP and DIP narrowing was noted.  No intertarsal, tibiotalar or subtalar joint space narrowing was noted.  No erosive changes were noted. Impression: These findings are consistent with osteoarthritis of the foot.  XR Hand 2 View Left  Result Date: 06/09/2022 [facility] and PIP narrowing was noted.  No significant DIP, MCP, intercarpal or radiocarpal joint space narrowing was noted.  No erosive changes were noted. Impression: These findings are consistent with osteoarthritis of the hand.  XR Hand 2 View Right  Result Date: 06/09/2022 [facility] and PIP narrowing was noted.  No significant DIP, MCP, intercarpal or radiocarpal joint space narrowing was noted.  No erosive changes were noted. Impression: These findings are consistent with osteoarthritis of the hand.   Recent Labs: Lab Results  Component Value Date   WBC 8.8 03/29/2022   HGB  13.6 03/29/2022   PLT 187 03/29/2022   NA 138 03/29/2022   K 3.7 03/29/2022   CL 106 03/29/2022   CO2 22 03/29/2022    GLUCOSE 93 03/29/2022   BUN 7 03/29/2022   CREATININE 0.52 03/29/2022   BILITOT 0.4 06/02/2021   ALKPHOS 88 06/02/2021   AST 41 (H) 06/02/2021   ALT 30 06/02/2021   PROT 7.2 06/02/2021   ALBUMIN 4.4 06/02/2021   CALCIUM 9.4 03/29/2022   GFRAA 125 06/10/2020    Speciality Comments: No specialty comments available.  Procedures:  No procedures performed Allergies: Percocet [oxycodone-acetaminophen] and Robaxin [methocarbamol]   Assessment / Plan:     Visit Diagnoses: Polyarthralgia -patient complains of pain and discomfort in her bilateral shoulder joints for many years.  She has been under the care of Dr. Marlou Sa.  She had right rotator cuff tear surgery in the past and has chronic discomfort and limited range of motion.  Recently she has been having pain and discomfort in her left shoulder joint.  I reviewed the MRI of her left shoulder joint from June 12, 2021 which showed mild tendinosis of the supraspinatus tendon.  No synovitis or effusion was noted.  Right shoulder joint x-rays obtained in the past by Dr. 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  She had no warmth swelling or effusion on my examination today.  She complains of a stiffness in her hands and feet.  No synovitis was noted.  There is no history of dactylitis, plantar fasciitis or Achilles tendinitis.  Plan: XR Hand 2 View Right, XR Hand 2 View Left, XR Foot 2 Views Right, XR Foot 2 Views Left x-rays of bilateral hands and bilateral feet were reviewed with the patient.  X-ray findings were consistent with osteoarthritis.  Joint protection muscle strengthening was discussed.  Proper fitting shoes were advised.  Arthritis of shoulder region, right-she is followed by Dr. Marlou Sa.  She has limited range of motion of her right shoulder joint.  She had multiple surgeries on her right shoulder.  H/O rotator cuff surgery   High risk medication use - Otezla 30 mg p.o twice daily prescribed by  her dermatologist.  Patient is concerned about weight loss on [place].  She wants to discuss other treatment options with her dermatologist.  Psoriasis - Followed by Dr. Nehemiah Massed at [facility] dermatology.  Nail dystrophy was noted.  She had hyperpigmentation of her right elbow.  She is on Otezla 30 mg twice daily.  History of osteoporosis-I do not have results available.  She is on alendronate.  She also takes calcium and vitamin D.  Need for regular exercise was discussed.  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 Maxie Better.  Nerve palsy  Other medical problems are listed as follows:  Chronic urticaria  Gastroesophageal reflux disease with esophagitis without hemorrhage  History of iron deficiency anemia  Moderate episode of recurrent major depressive disorder (HCC)  Current smoker - 1/3 PPD x 10 years  Family history of psoriasis-mother, other family members on maternal side  Orders: Orders Placed This Encounter  Procedures   XR Hand 2 View Right   XR Hand 2 View Left   XR Foot 2 Views Right   XR Foot 2 Views Left   No orders of the defined types were placed in this encounter.    Follow-Up Instructions: Return if symptoms worsen or fail to improve, for Pain in multiple joints and psoriasis.   Bo Merino, MD  Note -  This record has been created using [employer].  Chart creation errors have been sought, but may not always  have been located. Such creation errors do not reflect on  the standard of medical care.

## 2022-05-30 DIAGNOSIS — R6889 Other general symptoms and signs: Secondary | ICD-10-CM | POA: Diagnosis not present

## 2022-05-31 DIAGNOSIS — R6889 Other general symptoms and signs: Secondary | ICD-10-CM | POA: Diagnosis not present

## 2022-06-01 DIAGNOSIS — R6889 Other general symptoms and signs: Secondary | ICD-10-CM | POA: Diagnosis not present

## 2022-06-09 ENCOUNTER — Ambulatory Visit (INDEPENDENT_AMBULATORY_CARE_PROVIDER_SITE_OTHER): Payer: Medicaid Other

## 2022-06-09 ENCOUNTER — Encounter: Payer: Self-pay | Admitting: Rheumatology

## 2022-06-09 ENCOUNTER — Ambulatory Visit: Payer: Medicaid Other | Attending: Rheumatology | Admitting: Rheumatology

## 2022-06-09 VITALS — BP 134/88 | HR 60 | Resp 17 | Ht 63.5 in | Wt 87.4 lb

## 2022-06-09 DIAGNOSIS — F17200 Nicotine dependence, unspecified, uncomplicated: Secondary | ICD-10-CM

## 2022-06-09 DIAGNOSIS — M19011 Primary osteoarthritis, right shoulder: Secondary | ICD-10-CM

## 2022-06-09 DIAGNOSIS — M2550 Pain in unspecified joint: Secondary | ICD-10-CM

## 2022-06-09 DIAGNOSIS — Z8739 Personal history of other diseases of the musculoskeletal system and connective tissue: Secondary | ICD-10-CM

## 2022-06-09 DIAGNOSIS — L409 Psoriasis, unspecified: Secondary | ICD-10-CM

## 2022-06-09 DIAGNOSIS — F331 Major depressive disorder, recurrent, moderate: Secondary | ICD-10-CM

## 2022-06-09 DIAGNOSIS — K2100 Gastro-esophageal reflux disease with esophagitis, without bleeding: Secondary | ICD-10-CM

## 2022-06-09 DIAGNOSIS — Z840 Family history of diseases of the skin and subcutaneous tissue: Secondary | ICD-10-CM

## 2022-06-09 DIAGNOSIS — Z79899 Other long term (current) drug therapy: Secondary | ICD-10-CM

## 2022-06-09 DIAGNOSIS — Z9889 Other specified postprocedural states: Secondary | ICD-10-CM | POA: Diagnosis not present

## 2022-06-09 DIAGNOSIS — L508 Other urticaria: Secondary | ICD-10-CM

## 2022-06-09 DIAGNOSIS — G589 Mononeuropathy, unspecified: Secondary | ICD-10-CM

## 2022-06-09 DIAGNOSIS — Z862 Personal history of diseases of the blood and blood-forming organs and certain disorders involving the immune mechanism: Secondary | ICD-10-CM

## 2022-06-13 ENCOUNTER — Other Ambulatory Visit: Payer: Self-pay | Admitting: Surgical

## 2022-06-13 ENCOUNTER — Telehealth: Payer: Self-pay | Admitting: Orthopedic Surgery

## 2022-06-13 MED ORDER — TRAMADOL HCL 50 MG PO TABS: 50.0000 mg | ORAL_TABLET | Freq: Two times a day (BID) | ORAL | 0 refills | Status: DC

## 2022-06-13 NOTE — Telephone Encounter (Signed)
Pt called in to schedule appt... Pt is requesting medication.... Pt stated that her right shoulder went out... Pt stated that her shoulder is in pain and all she have is muscle relaxer... Pt is schedule to see Dr. Marlou Sa on 06/29/2022 at 3:15pm..... Pt requesting callback

## 2022-06-13 NOTE — Telephone Encounter (Signed)
IC advised. Patient verbalized understanding

## 2022-06-13 NOTE — Telephone Encounter (Signed)
Sent in RX for tramadol  that Dr Marlou Sa sent in before this year to hopefully help with acute pain.  No refill until OV

## 2022-06-22 ENCOUNTER — Other Ambulatory Visit: Payer: Self-pay | Admitting: Orthopedic Surgery

## 2022-06-27 ENCOUNTER — Ambulatory Visit: Payer: Medicaid Other | Admitting: Dermatology

## 2022-06-29 ENCOUNTER — Ambulatory Visit: Payer: Medicaid Other | Admitting: Orthopedic Surgery

## 2022-06-30 ENCOUNTER — Ambulatory Visit: Payer: Medicaid Other | Admitting: Rheumatology

## 2022-07-04 ENCOUNTER — Ambulatory Visit: Payer: Medicaid Other

## 2022-07-05 IMAGING — CR DG THORACIC SPINE 2V
3 series · 3 of 3 positions shown · non-contrast
Comparison: None.

CLINICAL DATA: Recent fall with back pain, initial encounter

EXAM:
THORACIC SPINE 2 VIEWS

[t-spine ap]
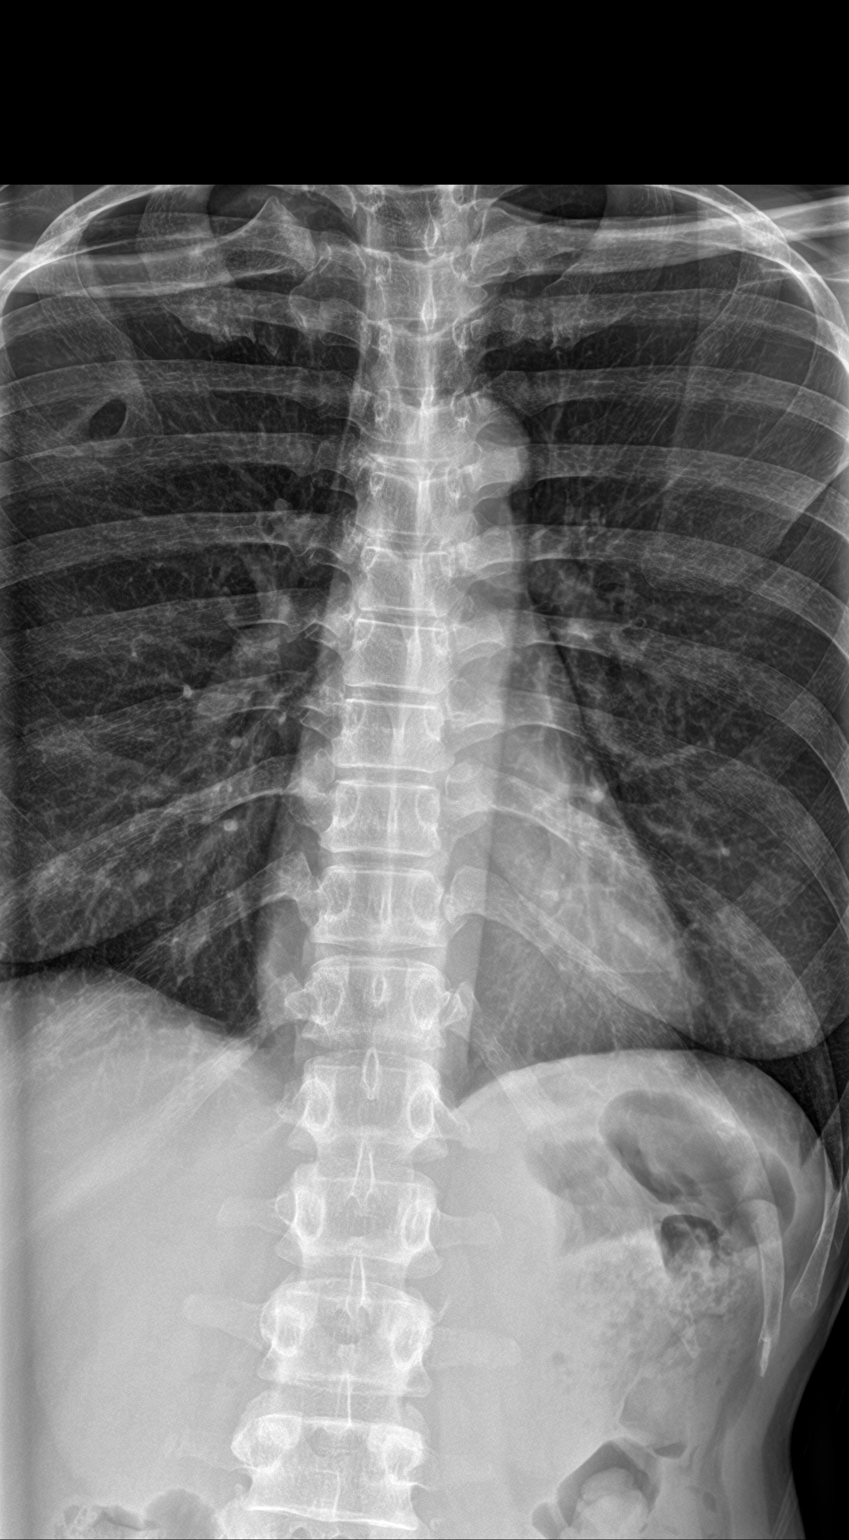

[t-spine lat]
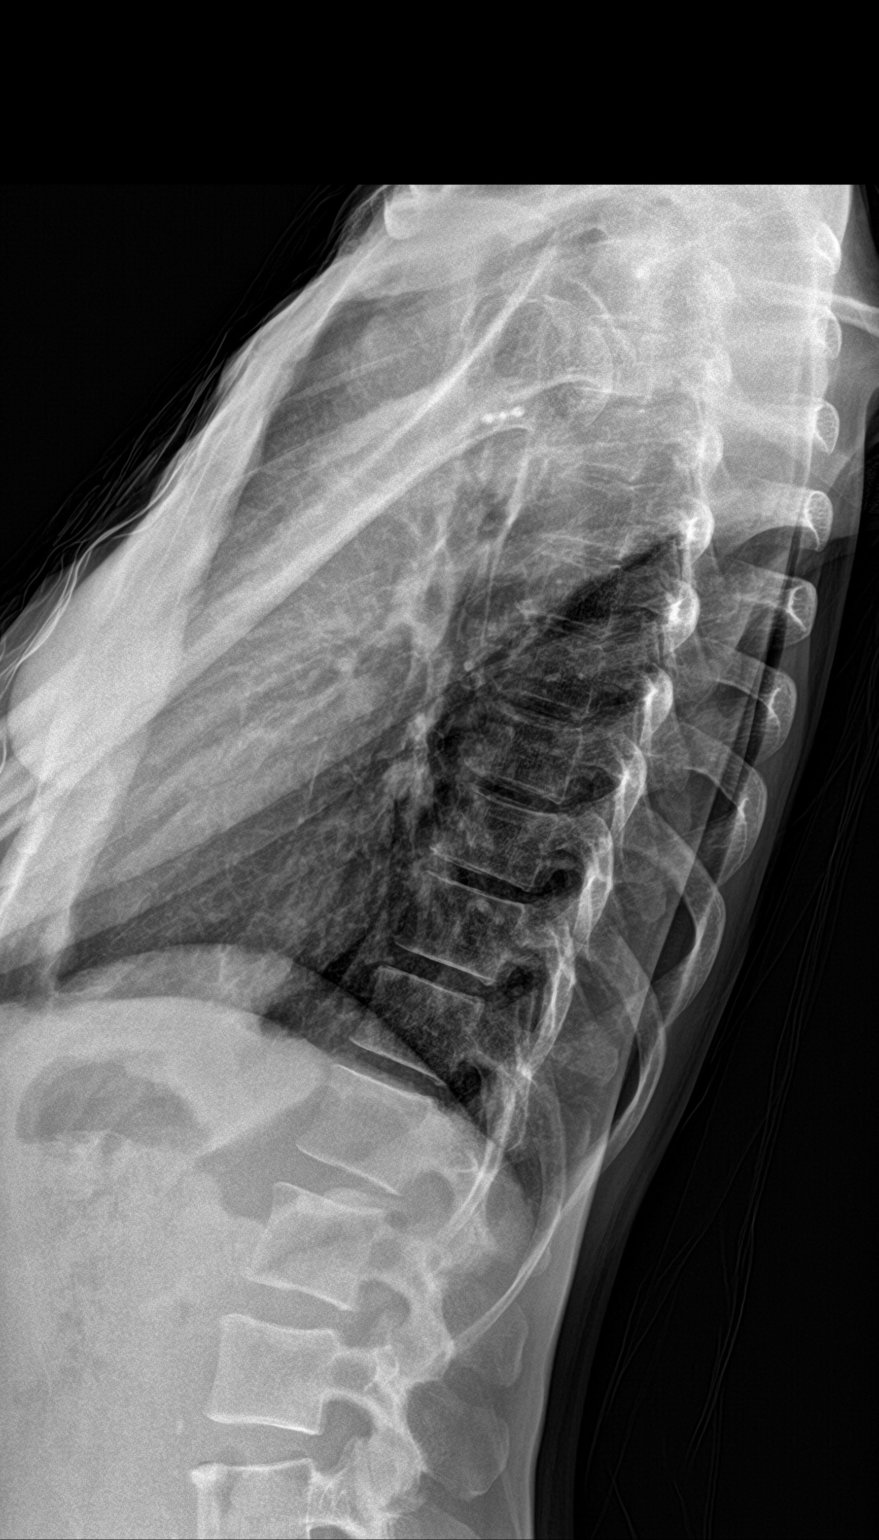

[t-spine swimmers]
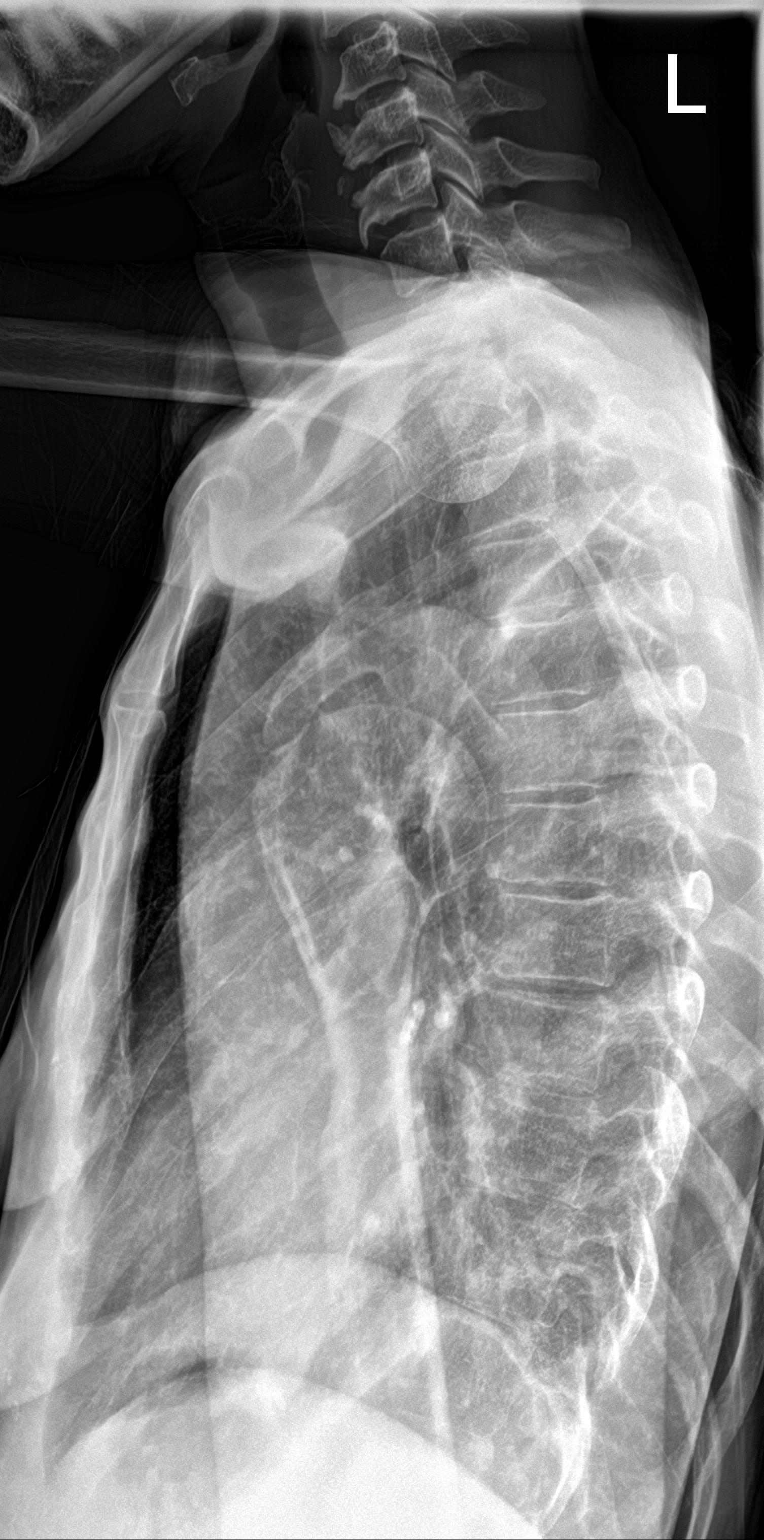

[3 of 3 positions shown; findings below may reference images not displayed]

FINDINGS: Vertebral body height is well maintained. No paraspinal mass is
seen. Pedicles are within normal limits. No acute rib abnormality is
noted.
IMPRESSION: No acute abnormality seen.

## 2022-07-05 IMAGING — CR DG LUMBAR SPINE COMPLETE 4+V
5 series · 5 of 5 positions shown · non-contrast
Comparison: None.

CLINICAL DATA: Recent fall with back pain, initial encounter

EXAM:
LUMBAR SPINE - COMPLETE 4+ VIEW

[l-spine ap]
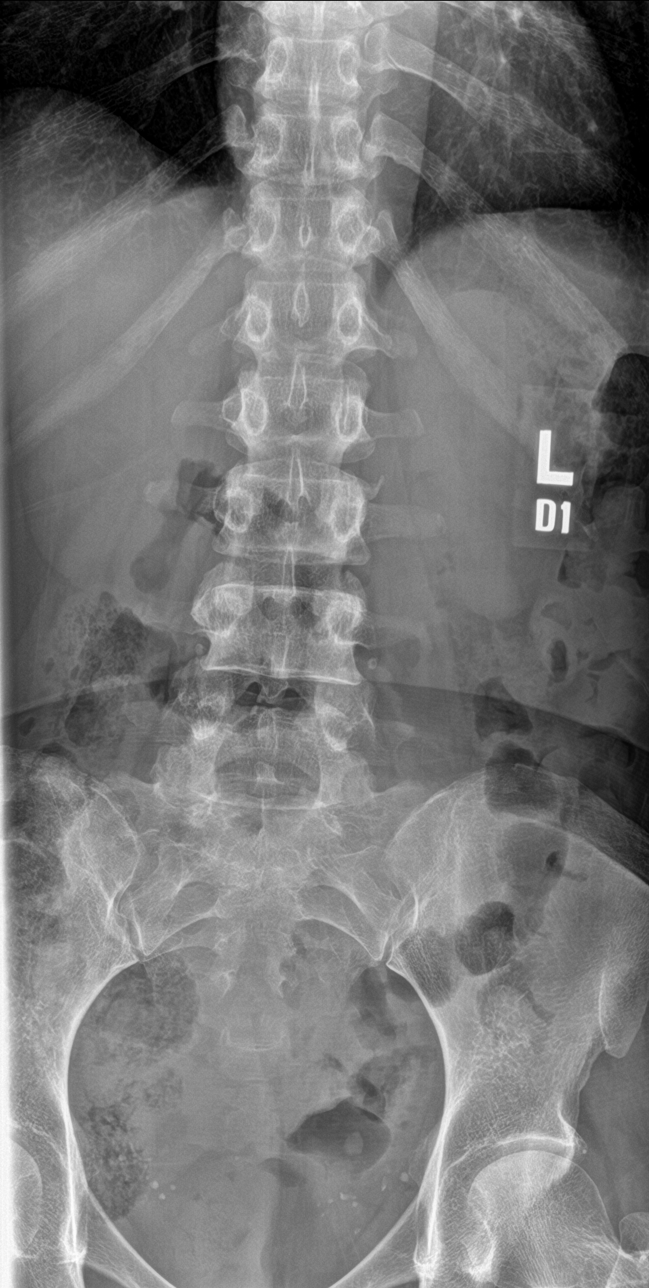

[l-spine obl (1 of 2)]
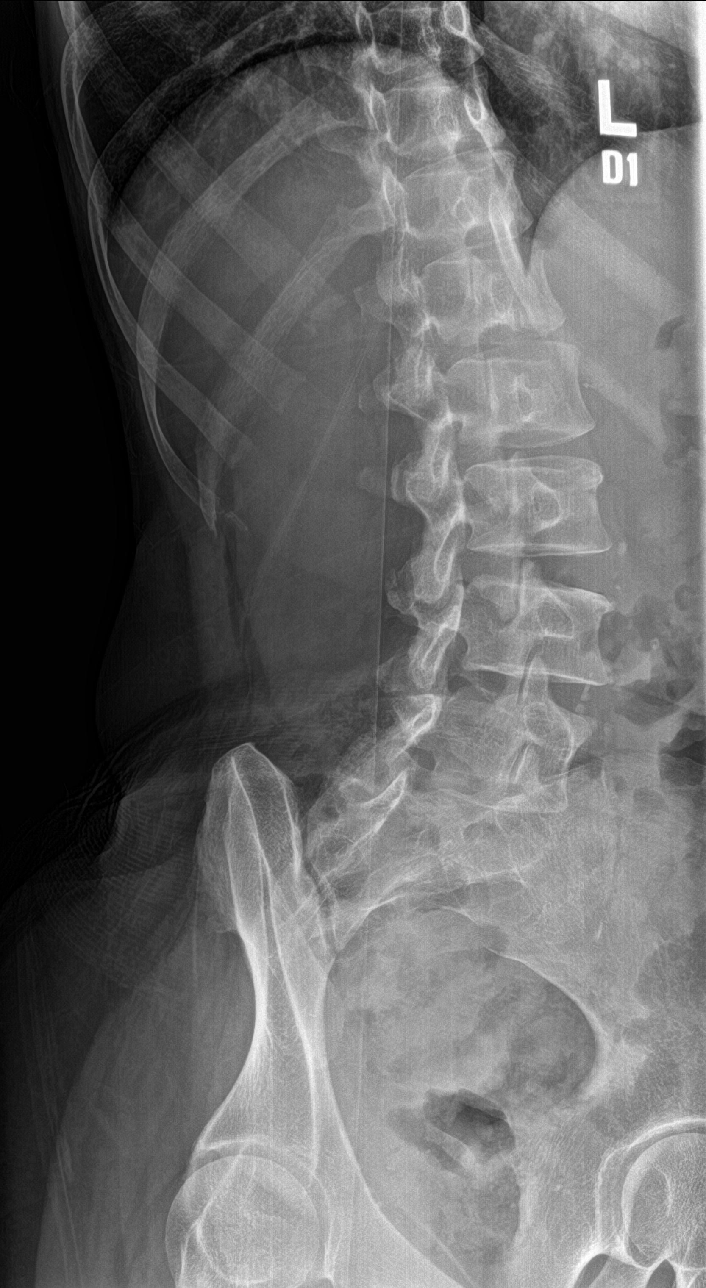

[l-spine obl (2 of 2)]
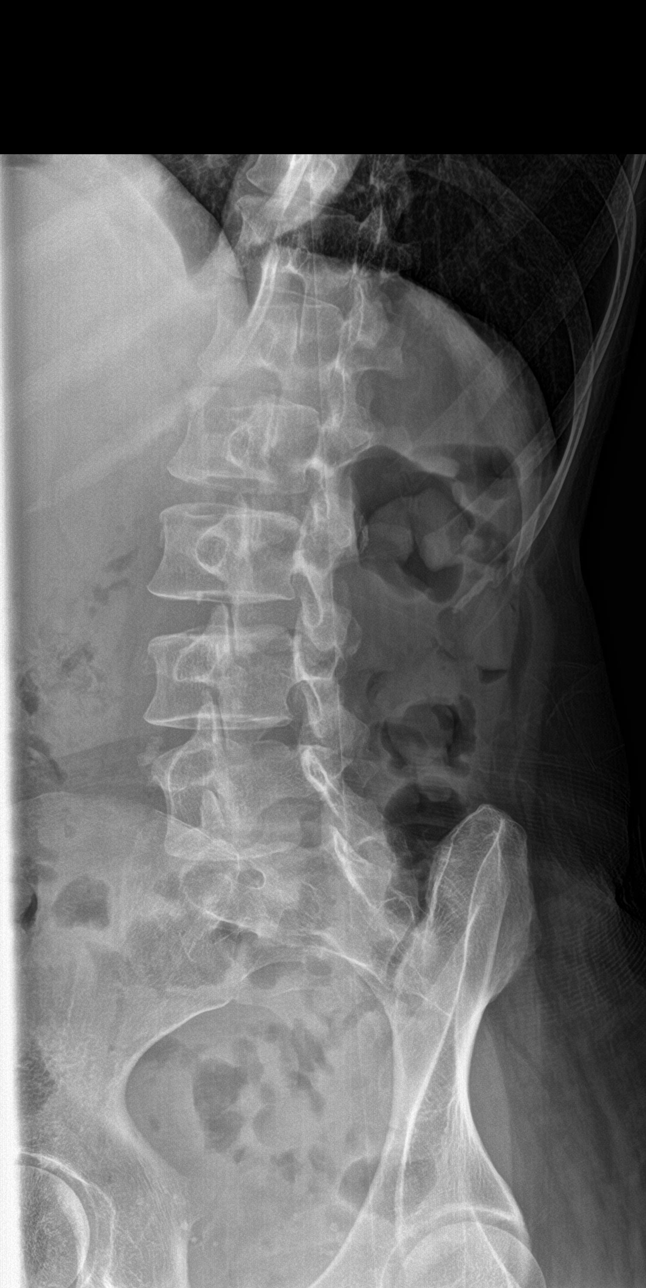

[l-spine lat]
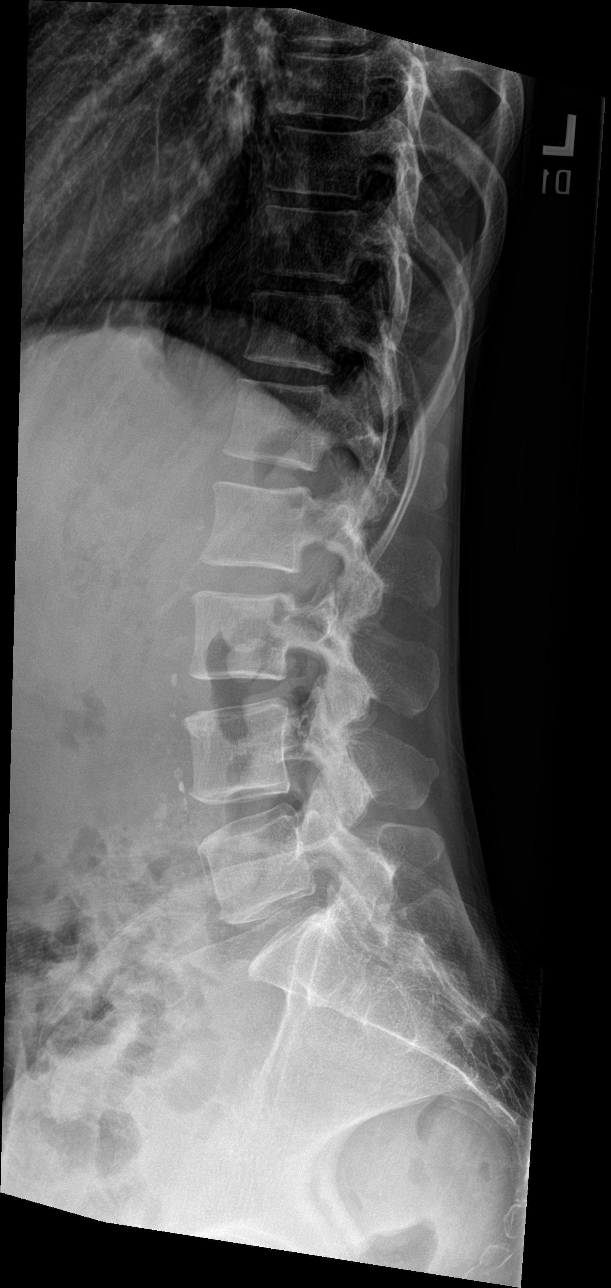

[l-spine spot]
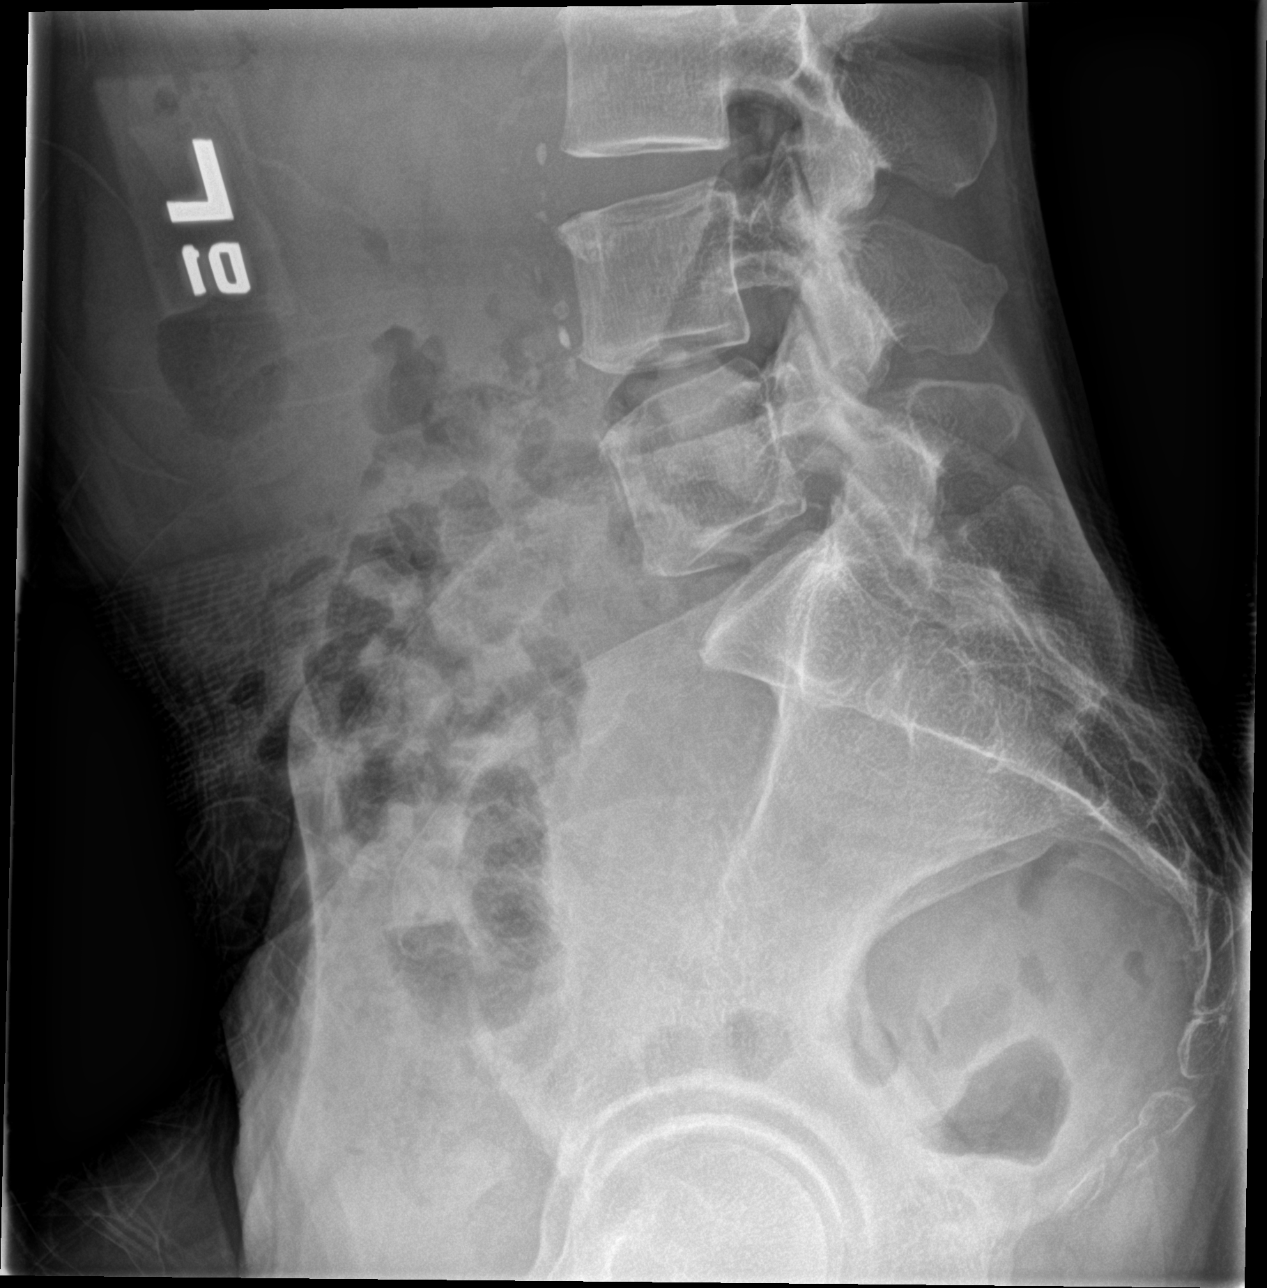

[5 of 5 positions shown; findings below may reference images not displayed]

FINDINGS: Five lumbar type vertebral bodies are well visualized. Vertebral
body height is well maintained. No pars defects are noted. No
anterolisthesis is seen. Mild aortic calcifications are noted. No
soft tissue abnormality is noted.
IMPRESSION: No acute abnormality noted.

## 2022-07-05 IMAGING — CR DG HIP (WITH OR WITHOUT PELVIS) 2-3V*L*
3 series · 3 of 3 positions shown · non-contrast
Comparison: None.

CLINICAL DATA: Recent fall with left hip pain, initial encounter

EXAM:
DG HIP (WITH OR WITHOUT PELVIS) 3V LEFT

[pelvis ap]
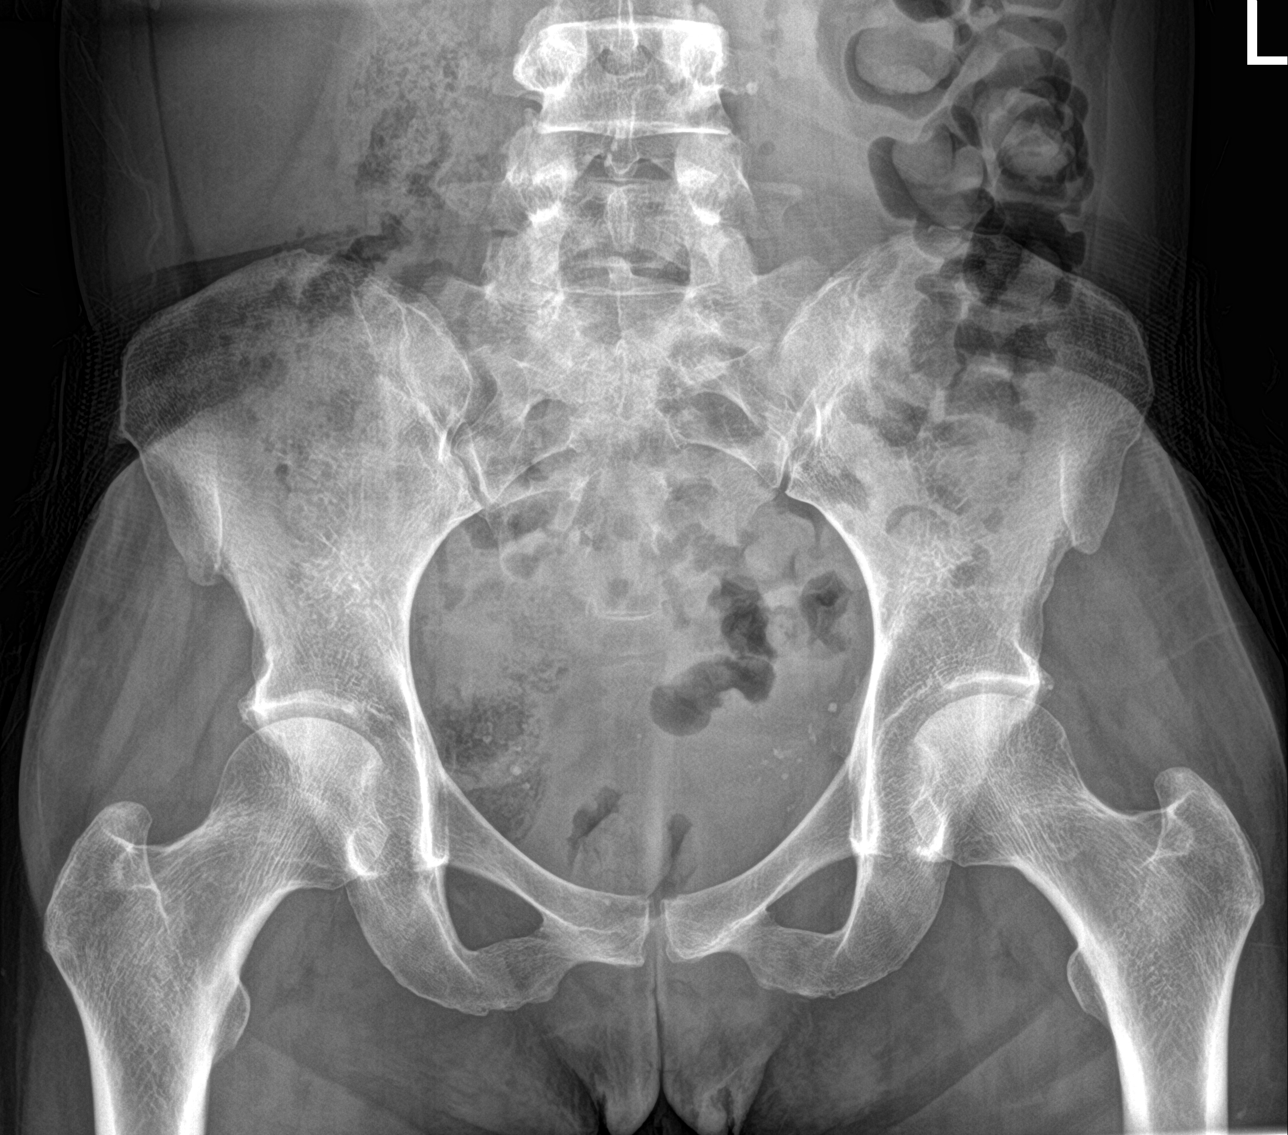

[hip ap]
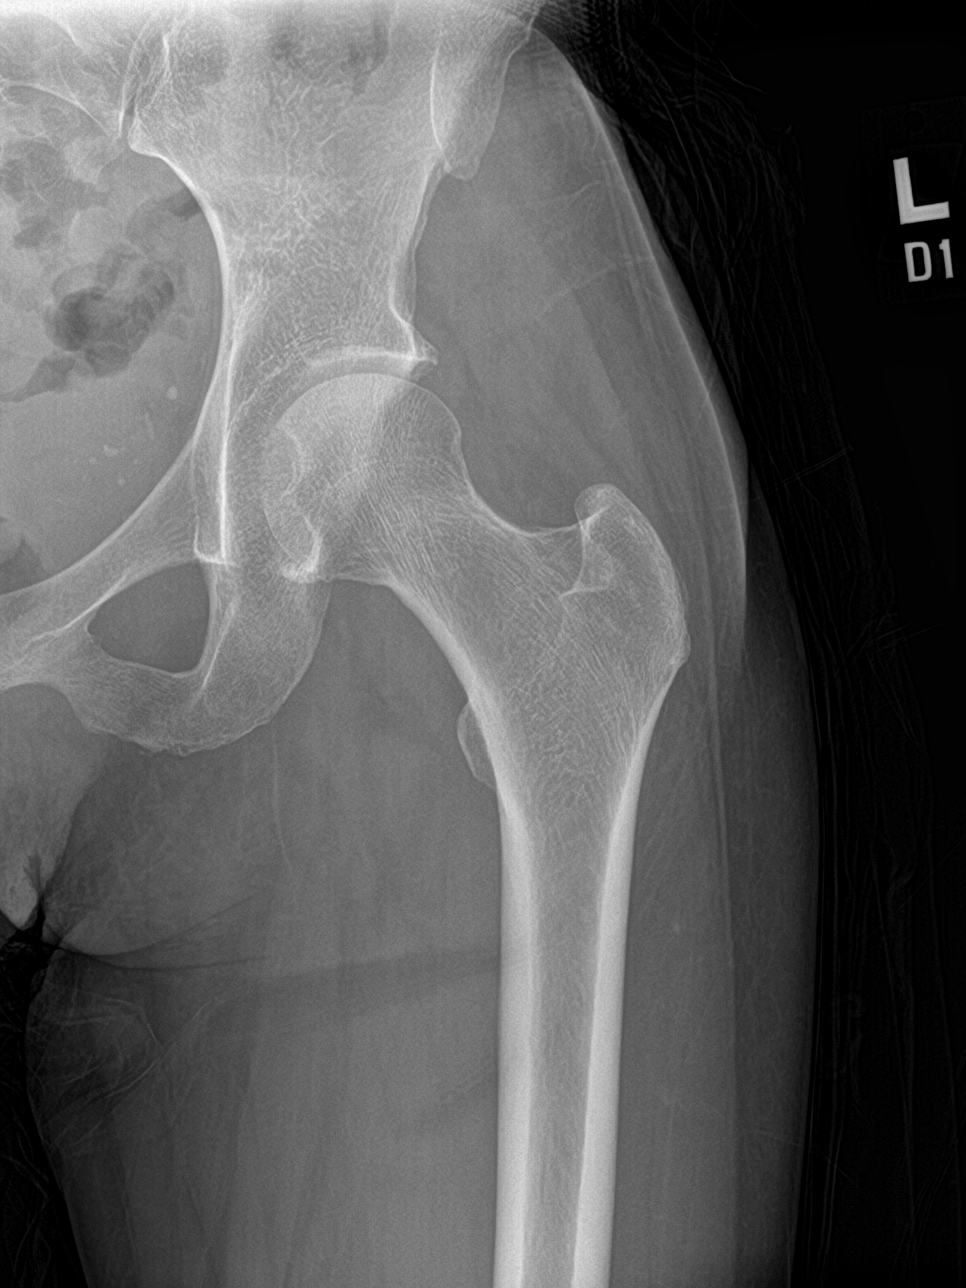

[hip lat]
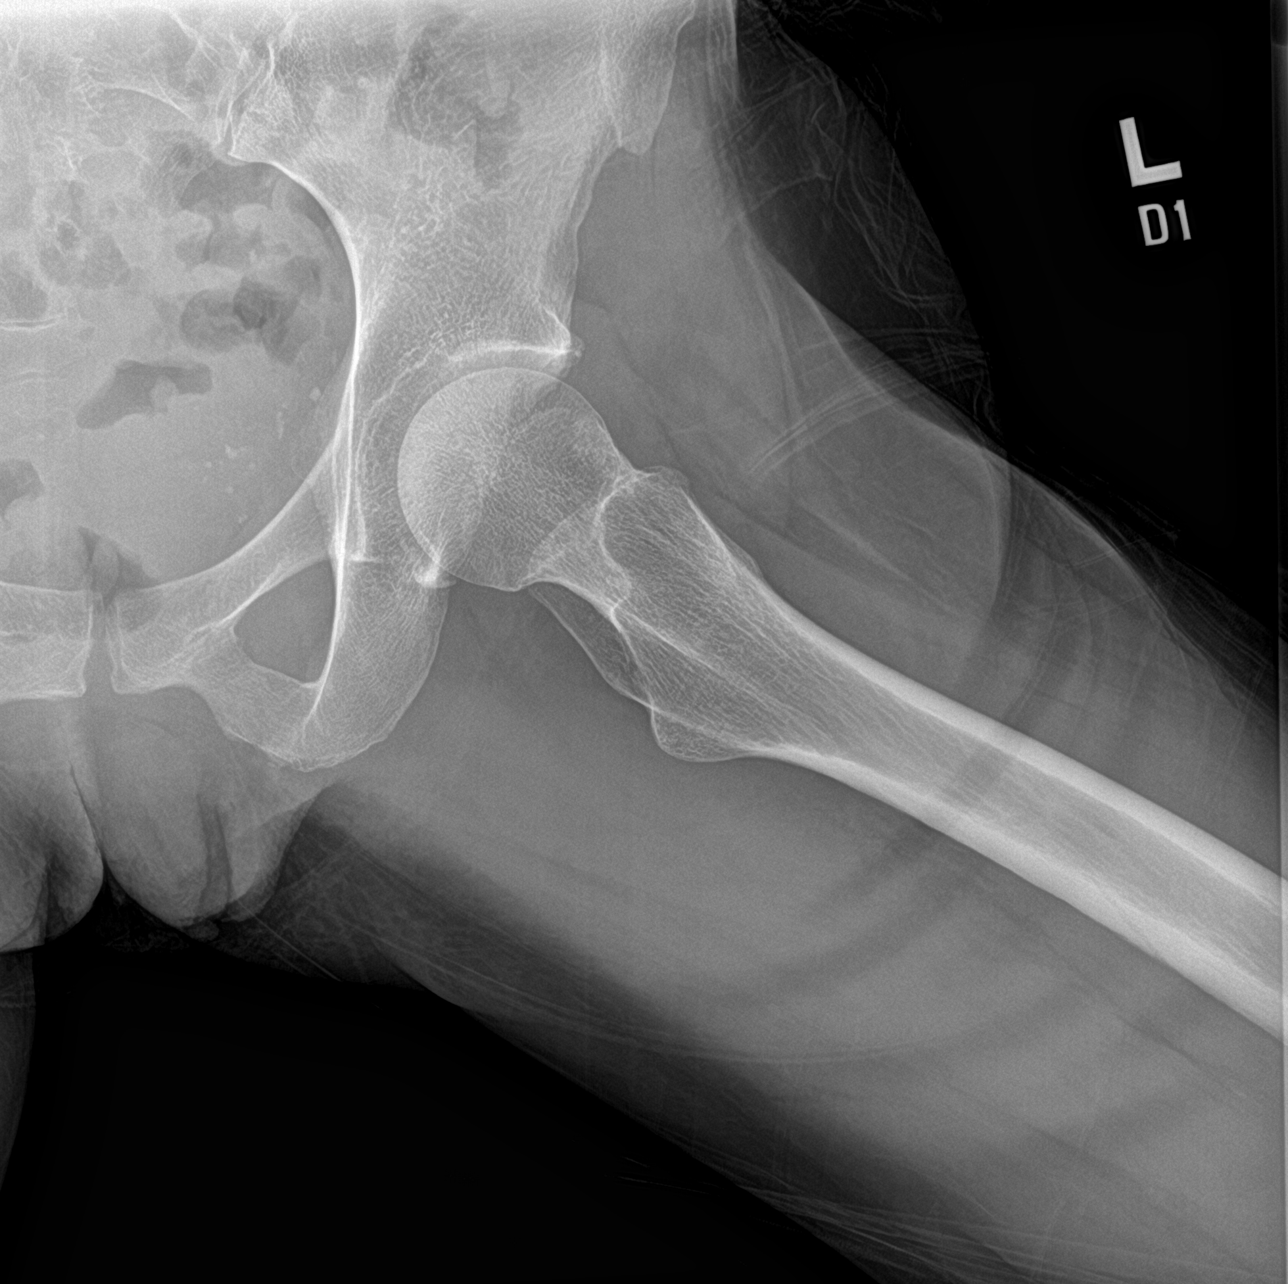

[3 of 3 positions shown; findings below may reference images not displayed]

FINDINGS: Pelvic ring is intact. No acute fracture or dislocation is noted. No
soft tissue abnormality is seen.
IMPRESSION: No acute abnormality noted.

## 2022-07-08 ENCOUNTER — Encounter: Payer: Self-pay | Admitting: Family Medicine

## 2022-07-08 ENCOUNTER — Ambulatory Visit: Payer: Medicaid Other | Admitting: Family Medicine

## 2022-07-08 VITALS — BP 106/80 | HR 81 | Wt 89.4 lb

## 2022-07-08 DIAGNOSIS — R296 Repeated falls: Secondary | ICD-10-CM

## 2022-07-08 DIAGNOSIS — M2550 Pain in unspecified joint: Secondary | ICD-10-CM

## 2022-07-08 NOTE — Progress Notes (Signed)
    SUBJECTIVE:   CHIEF COMPLAINT / HPI:  Chief Complaint  Patient presents with   Fall    DME order for walker    Patient presents today after she had a minor fall yesterday requesting to have order for rolling walker.  She is followed by rheumatology for polyarthralgia and has been on disabilities for about 3 years or so due to her joint pains.  Regarding fall yesterday, she reports that she did not have any lightheadedness or dizziness prior to the fall.  She was able to catch herself with her arms and did not sustain any injuries.  She thinks that her right knee may have given out but she is unsure.  She has had some pain primarily in the right hip.  She had a fall several months ago earlier this year.  She is worried about falling again and would like a rolling walker.  PERTINENT  PMH / PSH: depression, psoriasis, osteoporosis, arthritis  Patient Care Team: Zola Button, MD as PCP - General (Family Medicine)   OBJECTIVE:   BP 106/80   Pulse 81   Wt 89 lb 6 oz (40.5 kg)   SpO2 99%   BMI 15.58 kg/m   Physical Exam Constitutional:      General: She is not in acute distress. Cardiovascular:     Rate and Rhythm: Normal rate and regular rhythm.  Pulmonary:     Effort: Pulmonary effort is normal. No respiratory distress.     Breath sounds: Normal breath sounds.  Musculoskeletal:     Cervical back: Neck supple.  Neurological:     Mental Status: She is alert.     Gait: Gait normal.         07/08/2022    3:27 PM  Depression screen PHQ 2/9  Decreased Interest 0  Down, Depressed, Hopeless 0  PHQ - 2 Score 0  Altered sleeping 1  Tired, decreased energy 1  Change in appetite 0  Feeling bad or failure about yourself  0  Trouble concentrating 0  Moving slowly or fidgety/restless 0  Suicidal thoughts 0  PHQ-9 Score 2  Difficult doing work/chores Somewhat difficult     {Show previous vital signs (optional):23777}    ASSESSMENT/PLAN:   Frequent falls  Fortunately  has not sustained any major injuries after a minor fall yesterday.  Given fall earlier this year as well as yesterday, I think she would benefit from a rolling walker to help prevent future falls due to her orthopedic condition.  Order has been placed.  Return in about 3 months (around 10/07/2022) for physical.   Zola Button, MD [place]

## 2022-07-08 NOTE — Patient Instructions (Addendum)
It was nice seeing you today!  Come back for your Tdap vaccine whenever is a good time for you.  Stay well, Zola Button, MD [place] [phone]  --  Make sure to check out at the front desk before you leave today.  Please arrive at least 15 minutes prior to your scheduled appointments.  If you had blood work today, I will send you a MyChart message or a letter if results are normal. Otherwise, I will give you a call.  If you had a referral placed, they will call you to set up an appointment. Please give [place] a call if you don't hear back in the next 2 weeks.  If you need additional refills before your next appointment, please call your pharmacy first.

## 2022-07-13 ENCOUNTER — Telehealth: Payer: Self-pay

## 2022-07-13 NOTE — Telephone Encounter (Signed)
DME order for walker sent to [facility] for processing.

## 2022-07-27 ENCOUNTER — Ambulatory Visit: Payer: Medicaid Other | Admitting: Family Medicine

## 2022-07-27 ENCOUNTER — Other Ambulatory Visit: Payer: Self-pay

## 2022-07-27 ENCOUNTER — Emergency Department (HOSPITAL_COMMUNITY): Payer: Medicaid Other

## 2022-07-27 ENCOUNTER — Emergency Department (HOSPITAL_COMMUNITY)
Admission: EM | Admit: 2022-07-27 | Discharge: 2022-07-28 | Disposition: A | Discharge status: Home / Self Care | Payer: Medicaid Other | Attending: Emergency Medicine | Admitting: Emergency Medicine

## 2022-07-27 DIAGNOSIS — R0789 Other chest pain: Secondary | ICD-10-CM | POA: Insufficient documentation

## 2022-07-27 DIAGNOSIS — R079 Chest pain, unspecified: Secondary | ICD-10-CM | POA: Diagnosis not present

## 2022-07-27 DIAGNOSIS — F32A Depression, unspecified: Secondary | ICD-10-CM | POA: Diagnosis present

## 2022-07-27 DIAGNOSIS — R45851 Suicidal ideations: Secondary | ICD-10-CM

## 2022-07-27 DIAGNOSIS — R9431 Abnormal electrocardiogram [ECG] [EKG]: Secondary | ICD-10-CM | POA: Diagnosis not present

## 2022-07-27 DIAGNOSIS — Z1152 Encounter for screening for COVID-19: Secondary | ICD-10-CM | POA: Insufficient documentation

## 2022-07-27 HISTORY — DX: Suicidal ideations: R45.851

## 2022-07-27 LAB — RAPID URINE DRUG SCREEN, HOSP PERFORMED
Amphetamines: NOT DETECTED
Barbiturates: NOT DETECTED
Benzodiazepines: NOT DETECTED
Cocaine: POSITIVE — AB
Opiates: NOT DETECTED
Tetrahydrocannabinol: POSITIVE — AB

## 2022-07-27 LAB — CBC WITH DIFFERENTIAL/PLATELET
Abs Immature Granulocytes: 0.02 10*3/uL (ref 0.00–0.07)
Basophils Absolute: 0 10*3/uL (ref 0.0–0.1)
Basophils Relative: 1 %
Eosinophils Absolute: 0.1 10*3/uL (ref 0.0–0.5)
Eosinophils Relative: 1 %
HCT: 44.3 % (ref 36.0–46.0)
Hemoglobin: 14.9 g/dL (ref 12.0–15.0)
Immature Granulocytes: 0 %
Lymphocytes Relative: 33 %
Lymphs Abs: 2.3 10*3/uL (ref 0.7–4.0)
MCH: 30.5 pg (ref 26.0–34.0)
MCHC: 33.6 g/dL (ref 30.0–36.0)
MCV: 90.8 fL (ref 80.0–100.0)
Monocytes Absolute: 0.8 10*3/uL (ref 0.1–1.0)
Monocytes Relative: 12 %
Neutro Abs: 3.8 10*3/uL (ref 1.7–7.7)
Neutrophils Relative %: 53 %
Platelets: 232 10*3/uL (ref 150–400)
RBC: 4.88 MIL/uL (ref 3.87–5.11)
RDW: 12.9 % (ref 11.5–15.5)
WBC: 7 10*3/uL (ref 4.0–10.5)
nRBC: 0 % (ref 0.0–0.2)

## 2022-07-27 LAB — COMPREHENSIVE METABOLIC PANEL
ALT: 18 U/L (ref 0–44)
AST: 25 U/L (ref 15–41)
Albumin: 4.7 g/dL (ref 3.5–5.0)
Alkaline Phosphatase: 91 U/L (ref 38–126)
Anion gap: 9 (ref 5–15)
BUN: 11 mg/dL (ref 6–20)
CO2: 27 mmol/L (ref 22–32)
Calcium: 9.5 mg/dL (ref 8.9–10.3)
Chloride: 103 mmol/L (ref 98–111)
Creatinine, Ser: 0.47 mg/dL (ref 0.44–1.00)
GFR, Estimated: >60 mL/min (ref >60)
Glucose, Bld: 91 mg/dL (ref 70–99)
Potassium: 3.5 mmol/L (ref 3.5–5.1)
Sodium: 139 mmol/L (ref 135–145)
Total Bilirubin: 1.1 mg/dL (ref 0.3–1.2)
Total Protein: 8.7 g/dL — ABNORMAL HIGH (ref 6.5–8.1)

## 2022-07-27 LAB — ETHANOL: Alcohol, Ethyl (B): <10 mg/dL (ref <10)

## 2022-07-27 LAB — I-STAT BETA HCG BLOOD, ED (MC, WL, AP ONLY): I-stat hCG, quantitative: <5 m[IU]/mL (ref <5)

## 2022-07-27 LAB — TROPONIN I (HIGH SENSITIVITY): Troponin I (High Sensitivity): 3 ng/L (ref <18)

## 2022-07-27 MED ORDER — PANTOPRAZOLE SODIUM 40 MG PO TBEC
40.0000 mg | DELAYED_RELEASE_TABLET | Freq: Every day | ORAL | Status: DC
  Administered 2022-07-27: 40 mg via ORAL
  Filled 2022-07-27: qty 1

## 2022-07-27 MED ORDER — APREMILAST 30 MG PO TABS: 30.0000 mg | ORAL_TABLET | Freq: Two times a day (BID) | ORAL | Status: DC

## 2022-07-27 MED ORDER — SERTRALINE HCL 50 MG PO TABS
50.0000 mg | ORAL_TABLET | Freq: Every day | ORAL | Status: DC
  Administered 2022-07-27: 50 mg via ORAL
  Filled 2022-07-27: qty 1

## 2022-07-27 NOTE — Consult Note (Incomplete)
[facility] ED ASSESSMENT   Reason for Consult:  Suicidal Referring Physician:  Dr. Wyvonnia Dusky Patient Identification: Annette Chang MRN:  [id number] ED Chief Complaint: Suicidal ideation  Diagnosis:  Principal Problem:   Suicidal ideation   ED Assessment Time Calculation: Start Time: 1600 Stop Time: 1645 Total Time in Minutes (Assessment Completion): 45   HPI: Per triage note, "patient states she is tired and has SI, she denies having a plan ".  Subjective:  Annette Chang, 52 y.o., female patient seen face to face by this provider, consulted with Dr. Dwyane Dee; and chart reviewed on 07/27/22.  On evaluation Annette Chang reports that she has been having SI for 2 weeks with no plan, she is also stating that she is having impulsive thoughts of wanting to stab people for no reason. When provider asked patient what was going on in her life to feel this way patient stated "everything ".  Patient says that she is so tired of being in physical pain and not getting any help from people that are supposed to help her such as her Education officer, museum and aids they are supposed to come into the house to help fix her dinner.  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  Patient said that she put an order with DME for a walker and I told her it would not be ready for another 2 weeks, and patient stated she is tired of falling.  Patient is very tearful upon evaluation and says that she is just so scared about falling, she has fallen to where she fell flat on her face and cracked her teeth, she has also fallen at a different time on her right side and has bruised the right side of her body. Patient says she lives with her 36 year old daughter who works as she tries to help her out the best she can,  patient also has an older daughter who is 52 years old and has 4 kids and she is unable to help much.  Patient said that she used to be strong and independent and now she does not feel that way, she cannot play with her grandkids like she wants to, she says now it feels like all I do is sit around and take medications.  Patient says that she used to weigh 120 pounds, and now I am down to 83 pounds, she says her appetite is poor because she does not eat much, due to the fact she cannot make her own meals.  Patient says her sleep is fair she does have dreams often as she sees herself in a casket.  She denies AVH. Patient said the only psychiatric history she has, she was admitted to an inpatient psychiatric facility at around age 22, unable to remember the details, she said that her mother, and her daughters have a history of depression. Patient says she smokes THC and cocaine, she has been doing it more often in the last month, she also says she drinks beer used to be 3 times a week in the last month has been almost every day.   During evaluation Annette Chang is sitting in the conference room with provider in no acute distress. She is alert, oriented x 4, calm, tearful, cooperative and  attentive. Her mood is anxious and depressed, she also states she feels hopeless. Affect is flat and tearful.  She has normal speech, and behavior.  Objectively there is no evidence of psychosis/mania or delusional thinking.  Patient is able to converse coherently, goal directed thoughts, no distractibility, or pre-occupation.  She continues to endorse passive suicidal/self-harm/homicidal ideation, and denies psychosis, and paranoia.   Patient appears to be very sad, she is cooperative, and seems to want help.    Past Psychiatric History: Inpatient psychiatric facility at around age 29 per patient. Patient is not on any psychiatric medications.   Risk to Self or Others: Is the patient at risk to self? Yes Has the  patient been a risk to self in the past 6 months? No Has the patient been a risk to self within the distant past? No Is the patient a risk to others? Yes Has the patient been a risk to others in the past 6 months? No Has the patient been a risk to others within the distant past? No  [place] Scale:  [facility] from 07/27/2022 in [facility] ED from 03/29/2022 in [facility] from 01/30/2021 in [facility] Low Risk No Risk No Risk       AIMS:  , , ,  ,   ASAM:    Substance Abuse:     Past Medical History:  Past Medical History:  Diagnosis Date  . Anxiety   . 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 Blood transfusion without reported diagnosis   . Depression   . H/O rotator cuff surgery 01/02/2012  . H/O tubal ligation 01/02/2012  . H/O: C-section 01/02/2012   3   . H/O: C-section 01/02/2012   3   . Headache(784.0) 01/02/2012  . Iron deficiency anemia 01/02/2012  . 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 CESAREAN SECTION     3 previous c sections  . EYE SURGERY Bilateral   . MUSCLE REPAIR Right 01/10/2017   RIGHT PECTORAL MAJOR TO SCAPULA MUSCLE TRANSFER (Right  . NOVASURE ABLATION N/A 03/11/2014   Procedure: NOVASURE ABLATION;  Surgeon: Emily Filbert, MD;  Location: [facility];  Service: Gynecology;  Laterality: N/A;  . PECTORALIS TENDON REPAIR Right 01/10/2017   Procedure: RIGHT PECTORAL MAJOR TO SCAPULA MUSCLE TRANSFER;  Surgeon: Meredith Pel, MD;  Location: [facility];  Service: Orthopedics;  Laterality: Right;  . PECTORALIS TENDON REPAIR Right 12/28/2017   Procedure: RIGHT REVISION TENDON TRANSFER OF PECTORALIS MAJOR;  Surgeon: Meredith Pel, MD;  Location: [facility];  Service: Orthopedics;  Laterality: Right;  . rotator cuff surgery    . SHOULDER SURGERY    . TUBAL  LIGATION     Family History:  Family History  Problem Relation Age of Onset  . Diabetes Maternal Grandmother   . Diabetes Paternal Grandmother   . Diabetes Paternal Grandfather   . Colon cancer Neg Hx   . Esophageal cancer Neg Hx   . Rectal cancer Neg Hx   . Stomach cancer Neg Hx    Family Psychiatric  History: Per patient, her daughters and mother have hx depression.   Social History:  Social History   Substance and Sexual Activity  Alcohol Use Yes   Comment: weekends     Social History   Substance and Sexual Activity  Drug  Use Yes   Comment: marijuana used 3 times a weekly    Social History   Socioeconomic History  . Marital status: Single    Spouse name: Not on file  . Number of children: 2  . Years of education: 61  . Highest education level: Not on file  Occupational History  . Occupation: unemployed  Tobacco Use  . Smoking status: Former    Years: 5.00    Types: Cigarettes    Quit date: 11/2017    Years since quitting: 4.6    Passive exposure: Never  . Smokeless tobacco: Never  Vaping Use  . Vaping Use: Never used  Substance and Sexual Activity  . Alcohol use: Yes    Comment: weekends  . Drug use: Yes    Comment: marijuana used 3 times a weekly  . Sexual activity: Not Currently    Birth control/protection: Surgical  Other Topics Concern  . Not on file  Social History Narrative   Patient does not drink caffeine.   Patient is right handed.       Current Social History   (Please include date ( . td) when updating information )      Who lives at home: husband of 23 years and 58 yr old daughter 08/24/2016    Transportation: car 08/24/2016   Important Relationships & Pets: neighbors 08/24/2016    Current Stressors: only income is from husband's job 08/24/2016   Work / Education:  Unable to work due to shoulder 08/24/2016   Religious / Personal Beliefs: not assessed 08/24/2016   Interests / Fun: working to get out more 08/24/2016   Other: going to [place]  to establish mental health provider  08/24/2016                                                                                                      Social Determinants of Health   Financial Resource Strain: Not on file  Food Insecurity: Not on file  Transportation Needs: Not on file  Physical Activity: Not on file  Stress: Not on file  Social Connections: Not on file      Allergies:   Allergies  Allergen Reactions  . Percocet [Oxycodone-Acetaminophen] Itching  . Robaxin [Methocarbamol] Rash    Labs:  Results for orders placed or performed during the hospital encounter of 07/27/22 (from the past 48 hour(s))  Comprehensive metabolic panel     Status: Abnormal   Collection Time: 07/27/22 12:16 PM  Result Value Ref Range   Sodium 139 135 - 145 mmol/L   Potassium 3.5 3.5 - 5.1 mmol/L   Chloride 103 98 - 111 mmol/L   CO2 27 22 - 32 mmol/L   Glucose, Bld 91 70 - 99 mg/dL    Comment: Glucose reference range applies only to samples taken after fasting for at least 8 hours.   BUN 11 6 - 20 mg/dL   Creatinine, Ser 0.47 0.44 - 1.00 mg/dL   Calcium 9.5 8.9 - 10.3 mg/dL   Total Protein 8.7 (H) 6.5 - 8.1 g/dL   Albumin 4.7 3.5 -  5.0 g/dL   AST 25 15 - 41 U/L   ALT 18 0 - 44 U/L   Alkaline Phosphatase 91 38 - 126 U/L   Total Bilirubin 1.1 0.3 - 1.2 mg/dL   GFR, Estimated >60 >60 mL/min    Comment: (NOTE) Calculated using the CKD-EPI Creatinine Equation (2021)    Anion gap 9 5 - 15    Comment: Performed at [facility], [place] [address], [place], [place] [address]  Ethanol     Status: None   Collection Time: 07/27/22 12:16 PM  Result Value Ref Range   Alcohol, Ethyl (B) <10 <10 mg/dL    Comment: (NOTE) Lowest detectable limit for serum alcohol is 10 mg/dL.  For medical purposes only. Performed at [facility], [place] [address], [place], [place] [address]   CBC with Diff     Status: None   Collection Time: 07/27/22 12:16 PM  Result Value  Ref Range   WBC 7.0 4.0 - 10.5 K/uL   RBC 4.88 3.87 - 5.11 MIL/uL   Hemoglobin 14.9 12.0 - 15.0 g/dL   HCT 44.3 36.0 - 46.0 %   MCV 90.8 80.0 - 100.0 fL   MCH 30.5 26.0 - 34.0 pg   MCHC 33.6 30.0 - 36.0 g/dL   RDW 12.9 11.5 - 15.5 %   Platelets 232 150 - 400 K/uL    Comment: SPECIMEN CHECKED FOR CLOTS REPEATED TO VERIFY PLATELET COUNT CONFIRMED BY SMEAR    nRBC 0.0 0.0 - 0.2 %   Neutrophils Relative % 53 %   Neutro Abs 3.8 1.7 - 7.7 K/uL   Lymphocytes Relative 33 %   Lymphs Abs 2.3 0.7 - 4.0 K/uL   Monocytes Relative 12 %   Monocytes Absolute 0.8 0.1 - 1.0 K/uL   Eosinophils Relative 1 %   Eosinophils Absolute 0.1 0.0 - 0.5 K/uL   Basophils Relative 1 %   Basophils Absolute 0.0 0.0 - 0.1 K/uL   Immature Granulocytes 0 %   Abs Immature Granulocytes 0.02 0.00 - 0.07 K/uL    Comment: Performed at [facility], [place] [address], [place], [place] [address]  Troponin I (High Sensitivity)     Status: None   Collection Time: 07/27/22 12:26 PM  Result Value Ref Range   Troponin I (High Sensitivity) 3 <18 ng/L    Comment: (NOTE) Elevated high sensitivity troponin I (hsTnI) values and significant  changes across serial measurements may suggest ACS but many other  chronic and acute conditions are known to elevate hsTnI results.  Refer to the "Links" section for chest pain algorithms and additional  guidance. Performed at [facility], [place] [address] [place], [place] [address]   I-Stat beta hCG blood, ED     Status: None   Collection Time: 07/27/22 12:31 PM  Result Value Ref Range   I-stat hCG, quantitative <5.0 <5 mIU/mL   Comment 3            Comment:   GEST. AGE      CONC.  (mIU/mL)   <=1 WEEK        5 - 50     2 WEEKS       50 - 500     3 WEEKS       100 - 10,000     4 WEEKS     1,000 - 30,000        FEMALE AND NON-PREGNANT FEMALE:     LESS  THAN 5 mIU/mL   Urine rapid drug screen (hosp performed)     Status: Abnormal   Collection Time:  07/27/22 12:58 PM  Result Value Ref Range   Opiates NONE DETECTED NONE DETECTED   Cocaine POSITIVE (A) NONE DETECTED   Benzodiazepines NONE DETECTED NONE DETECTED   Amphetamines NONE DETECTED NONE DETECTED   Tetrahydrocannabinol POSITIVE (A) NONE DETECTED   Barbiturates NONE DETECTED NONE DETECTED    Comment: (NOTE) DRUG SCREEN FOR MEDICAL PURPOSES ONLY.  IF CONFIRMATION IS NEEDED FOR ANY PURPOSE, NOTIFY LAB WITHIN 5 DAYS.  LOWEST DETECTABLE LIMITS FOR URINE DRUG SCREEN Drug Class                     Cutoff (ng/mL) Amphetamine and metabolites    1000 Barbiturate and metabolites    200 Benzodiazepine                 200 Opiates and metabolites        300 Cocaine and metabolites        300 THC                            50 Performed at [facility], [place] [address] [place], [place] [address]     Current Facility-Administered Medications  Medication Dose Route Frequency Provider Last Rate Last Admin  . Apremilast TABS 30 mg  30 mg Oral BID Rancour, Stephen, MD      . pantoprazole (PROTONIX) EC tablet 40 mg  40 mg Oral Daily Rancour, Stephen, MD      . sertraline (ZOLOFT) tablet 50 mg  50 mg Oral Daily Rancour, Stephen, MD       Current Outpatient Medications  Medication Sig Dispense Refill  . alendronate (FOSAMAX) 70 MG tablet Take 1 tablet (70 mg total) by mouth every 7 (seven) days. Take with a full glass of water on an empty stomach. 4 tablet 4  . Apremilast (OTEZLA) 30 MG TABS Take 1 tablet (30 mg total) by mouth 2 (two) times daily. 28 tablet 12  . Calcium Carbonate Antacid (ALKA-SELTZER ANTACID PO) Take 1 tablet by mouth daily as needed (gas, flatulance).    . Calcium-Vitamin D-Vitamin K 500-500-40 MG-UNT-MCG CHEW Chew 1 tablet by mouth daily.    . Ferrous Gluconate (IRON 27 PO) Take 27 mg by mouth daily.    Marland Kitchen ketorolac (ACULAR) 0.4 % SOLN Place 1 drop into the left eye daily as needed (pain).    . methocarbamol (ROBAXIN) 500 MG tablet TAKE ONE TABLET  BY MOUTH every 8 HOURS AS NEEDED FOR muscle SPASMS (Patient taking differently: Take 500 mg by mouth every 8 (eight) hours as needed for muscle spasms.) 30 tablet 0  . omeprazole (PRILOSEC) 20 MG capsule TAKE ONE CAPSULE BY MOUTH EVERY DAY 30 capsule PRN  . sertraline (ZOLOFT) 50 MG tablet TAKE ONE TABLET BY MOUTH DAILY (Patient taking differently: Take 50 mg by mouth daily.) 90 tablet 3  . traMADol (ULTRAM) 50 MG tablet Take 1 tablet (50 mg total) by mouth 2 (two) times daily. 30 tablet 0  . vitamin C (ASCORBIC ACID) 500 MG tablet Take 500 mg by mouth daily.     . nicotine polacrilex (RA NICOTINE GUM) 2 MG gum Take 1 each (2 mg total) by mouth as needed for smoking cessation. 20 each 1    Musculoskeletal: Strength & Muscle Tone: decreased Gait & Station: unsteady Patient leans: Right  Psychiatric Specialty Exam: Presentation  General Appearance:  Appropriate for Environment  Eye Contact: Good  Speech: Clear and Coherent  Speech Volume: Normal  Handedness: Right   Mood and Affect  Mood: Anxious; Depressed; Hopeless  Affect: Flat; Tearful   Thought Process  Thought Processes: Coherent  Descriptions of Associations:Intact  Orientation:Full (Time, Place and Person)  Thought Content:WDL  History of Schizophrenia/Schizoaffective disorder:No data recorded Duration of Psychotic Symptoms:No data recorded Hallucinations:Hallucinations: None  Ideas of Reference:None  Suicidal Thoughts:Suicidal Thoughts: Yes, Passive SI Passive Intent and/or Plan: Without Intent; Without Plan  Homicidal Thoughts:Homicidal Thoughts: Yes, Passive   Sensorium  Memory: Immediate Fair; Remote Good  Judgment: Fair  Insight: Fair   Community education officer  Concentration: Fair  Attention Span: Good  Recall: Good  Fund of Knowledge: Fair  Language: Good   Psychomotor Activity  Psychomotor Activity: Psychomotor Activity: Normal   Assets  Assets: Restaurant manager, fast food; Housing; Social Support; Communication Skills    Sleep  Sleep: Sleep: Fair   Physical Exam: Physical Exam ROS Blood pressure (!) 128/98, pulse 89, temperature 98.2 F (36.8 C), temperature source Oral, resp. rate 18, height '5\' 1"'$  (1.549 m), weight 45.4 kg, SpO2 100 %. Body mass index is 18.89 kg/m.  Medical Decision Making: Inpatient Admission Social Work will fax him out  Disposition: Recommend psychiatric Inpatient admission when medically cleared.  Michaele Offer, PMHNP 07/27/2022 5:13 PM

## 2022-07-27 NOTE — ED Provider Notes (Signed)
[place] DEPT Provider Note   CSN: [id number] Arrival date & time: 07/27/22  1141     History  Chief Complaint  Patient presents with   Suicidal    Annette Chang is a 52 y.o. female.  Patient with a history of arthritis, chronic pain, anemia presenting with depression and thoughts of wanting to hurt herself.  Symptoms worsening for the past 2 to weeks.  States has been lying in bed "overwhelmed with her life and "seeing myself in a casket".  She does not have a plan to hurt herself now but is worried what she might do.  She has been using cocaine and alcohol and taking her medications as prescribed.  Denies any homicidal thoughts or hallucinations.  Has never been hospitalized for depression in the past.  Called her neighbor today because she was concerned about feeling tired and wanting to hurt herself.  Complains of central chest pain which has been constant since having a fall 2 weeks ago landing directly on her chest.  Pain is constant nothing makes it better or worse.  Denies any cardiac history.  Denies hitting her head when she fell.  The history is provided by the patient.       Home Medications Prior to Admission medications   Medication Sig Start Date End Date Taking? Authorizing Provider  alendronate (FOSAMAX) 70 MG tablet Take 1 tablet (70 mg total) by mouth every 7 (seven) days. Take with a full glass of water on an empty stomach. 05/09/22   Zola Button, MD  Apremilast (OTEZLA) 30 MG TABS Take 1 tablet (30 mg total) by mouth 2 (two) times daily. 04/14/22   Ralene Bathe, MD  Apremilast (OTEZLA) 30 MG TABS Take 1 tablet (30 mg total) by mouth 2 (two) times daily. Patient not taking: Reported on 06/09/2022 04/14/22   Ralene Bathe, MD  calcium carbonate (TUMS - DOSED IN MG ELEMENTAL CALCIUM) 500 MG chewable tablet Chew 1 tablet by mouth daily as needed for indigestion or heartburn. Patient not taking: Reported on 06/09/2022     [provider]  Calcium Carbonate Antacid (ALKA-SELTZER ANTACID PO) Take 1 tablet by mouth daily as needed (gas, flatulance). Patient not taking: Reported on 06/09/2022    [provider]  Calcium-Vitamin D-Vitamin K 500-500-40 MG-UNT-MCG CHEW Chew 1 tablet by mouth daily.    [provider]  ketorolac (ACULAR) 0.4 % SOLN Place 2 drops into the left eye daily. 01/08/21   [provider]  methocarbamol (ROBAXIN) 500 MG tablet TAKE ONE TABLET BY MOUTH every 8 HOURS AS NEEDED FOR muscle SPASMS 06/22/22   Magnant, Charles L, PA-C  nicotine polacrilex (RA NICOTINE GUM) 2 MG gum Take 1 each (2 mg total) by mouth as needed for smoking cessation. Patient not taking: Reported on 06/09/2022 08/17/21   [name], DO  omeprazole (PRILOSEC) 20 MG capsule TAKE ONE CAPSULE BY MOUTH EVERY DAY 02/14/22   Zola Button, MD  sertraline (ZOLOFT) 50 MG tablet TAKE ONE TABLET BY MOUTH DAILY 01/18/22   [name], DO  traMADol (ULTRAM) 50 MG tablet Take 1 tablet (50 mg total) by mouth 2 (two) times daily. 06/13/22   Magnant, Charles L, PA-C  vitamin C (ASCORBIC ACID) 500 MG tablet Take 500 mg by mouth daily.     [provider]      Allergies    Percocet [oxycodone-acetaminophen] and Robaxin [methocarbamol]    Review of Systems   Review of Systems  Constitutional:  Negative for activity change, appetite change and fever.  HENT:  Negative for congestion and rhinorrhea.   Cardiovascular:  Positive for chest pain.  Gastrointestinal:  Negative for abdominal pain, nausea and vomiting.  Genitourinary:  Negative for dysuria.  Musculoskeletal:  Negative for arthralgias, back pain and myalgias.  Skin:  Negative for rash.  Neurological:  Negative for weakness and headaches.  Psychiatric/Behavioral:  Positive for decreased concentration, dysphoric mood, self-injury, sleep disturbance and suicidal ideas. The patient is nervous/anxious.    all other systems are negative except  as noted in the HPI and PMH.    Physical Exam Updated Vital Signs BP (!) 128/98 (BP Location: Right Arm)   Pulse 89   Temp 98.2 F (36.8 C) (Oral)   Resp 18   Ht '5\' 1"'$  (1.549 m)   Wt 45.4 kg   SpO2 100%   BMI 18.89 kg/m  Physical Exam Vitals and nursing note reviewed.  Constitutional:      General: She is not in acute distress.    Appearance: She is well-developed.     Comments: tearful  HENT:     Head: Normocephalic and atraumatic.     Mouth/Throat:     Pharynx: No oropharyngeal exudate.  Eyes:     Conjunctiva/sclera: Conjunctivae normal.     Pupils: Pupils are equal, round, and reactive to light.  Neck:     Comments: No meningismus. Cardiovascular:     Rate and Rhythm: Normal rate and regular rhythm.     Heart sounds: Normal heart sounds. No murmur heard. Pulmonary:     Effort: Pulmonary effort is normal. No respiratory distress.     Breath sounds: Normal breath sounds.     Comments: Reproducible chest wall tenderness Chest:     Chest wall: Tenderness present.  Abdominal:     Palpations: Abdomen is soft.     Tenderness: There is no abdominal tenderness. There is no guarding or rebound.  Musculoskeletal:        General: No tenderness. Normal range of motion.     Cervical back: Normal range of motion and neck supple.  Skin:    General: Skin is warm.  Neurological:     Mental Status: She is alert and oriented to person, place, and time.     Cranial Nerves: No cranial nerve deficit.     Motor: No abnormal muscle tone.     Coordination: Coordination normal.     Comments:  5/5 strength throughout. CN 2-12 intact.Equal grip strength.   Psychiatric:        Behavior: Behavior normal.     ED Results / Procedures / Treatments   Labs (all labs ordered are listed, but only abnormal results are displayed) Labs Reviewed  COMPREHENSIVE METABOLIC PANEL - Abnormal; Notable for the following components:      Result Value   Total Protein 8.7 (*)    All other components  within normal limits  RAPID URINE DRUG SCREEN, HOSP PERFORMED - Abnormal; Notable for the following components:   Cocaine POSITIVE (*)    Tetrahydrocannabinol POSITIVE (*)    All other components within normal limits  ETHANOL  CBC WITH DIFFERENTIAL/PLATELET  I-STAT BETA HCG BLOOD, ED ([facility], [facility], [facility] ONLY)  TROPONIN I (HIGH SENSITIVITY)  TROPONIN I (HIGH SENSITIVITY)    EKG EKG Interpretation  Date/Time:  Wednesday July 27 2022 15:04:21 EST Ventricular Rate:  81 PR Interval:  128 QRS Duration: 70 QT Interval:  370 QTC Calculation: 429 R Axis:   29 Text  Interpretation: Sinus rhythm with Fusion complexes Right atrial enlargement Anterior infarct , age undetermined Abnormal ECG No previous ECGs available Nonspecific T wave abnormality Confirmed by Ezequiel Essex [phone]) on 07/27/2022 3:37:43 PM  Radiology DG Chest 2 View  Result Date: 07/27/2022 CLINICAL DATA:  Chest pain after fall. EXAM: CHEST - 2 VIEW COMPARISON:  January 30, 2021. FINDINGS: The heart size and mediastinal contours are within normal limits. Both lungs are clear. The visualized skeletal structures are unremarkable. IMPRESSION: No active cardiopulmonary disease. Electronically Signed   By: Marijo Conception M.D.   On: 07/27/2022 13:14    Procedures Procedures    Medications Ordered in ED Medications - No data to display  ED Course/ Medical Decision Making/ A&P                           Medical Decision Making Amount and/or Complexity of Data Reviewed Labs: ordered. Decision-making details documented in ED Course. Radiology: ordered and independent interpretation performed. Decision-making details documented in ED Course. ECG/medicine tests: ordered and independent interpretation performed. Decision-making details documented in ED Course.  Depression and suicidal thoughts for the past 2 weeks.  Vital stable, no distress, no fever.  Ongoing chest pain since fall 2 weeks ago.  Low suspicion for ACS.  EKG shows new T  wave inversion which was not present on most recent EKG but was present in 2021.  Chest pain is been constant for the past 2 weeks and is reproducible.  Chest x-ray is negative for fracture or pneumothorax.  Troponin negative x 1.  Low suspicion for ACS.  UDS positive for cocaine and THC.  Patient medically clear for psychiatric evaluation. Holding orders placed.        Final Clinical Impression(s) / ED Diagnoses Final diagnoses:  None    Rx / DC Orders ED Discharge Orders     None         Eoin Willden, Annie Main, MD 07/27/22 1542

## 2022-07-27 NOTE — ED Triage Notes (Signed)
Patient states she is tired and has SI she denies having a plan

## 2022-07-27 NOTE — Progress Notes (Deleted)
    SUBJECTIVE:   CHIEF COMPLAINT / HPI:   Annette Chang is a 52 y.o. female who presents to the [facility] clinic today to discuss the following concerns:   Depression Has a noted history of MDD.  He has been on Zoloft 50 mg for this.  PERTINENT  PMH / PSH: ***  OBJECTIVE:   There were no vitals taken for this visit. ***  General: NAD, pleasant, able to participate in exam Cardiac: RRR, no murmurs. Respiratory: CTAB, normal effort, No wheezes, rales or rhonchi Abdomen: Bowel sounds present, nontender, nondistended, no hepatosplenomegaly. Extremities: no edema or cyanosis. Skin: warm and dry, no rashes noted Neuro: alert, no obvious focal deficits Psych: Normal affect and mood  ASSESSMENT/PLAN:   No problem-specific Assessment & Plan notes found for this encounter.     Sharion Settler, [place]

## 2022-07-28 ENCOUNTER — Encounter (HOSPITAL_COMMUNITY): Payer: Self-pay | Admitting: Psychiatry

## 2022-07-28 ENCOUNTER — Inpatient Hospital Stay (HOSPITAL_COMMUNITY)
Admission: AD | Admit: 2022-07-28 | Discharge: 2022-08-03 | DRG: 885 | Disposition: A | Discharge status: Home / Self Care | Payer: Medicaid Other | Source: Intra-hospital | Attending: Psychiatry | Admitting: Psychiatry

## 2022-07-28 DIAGNOSIS — E611 Iron deficiency: Secondary | ICD-10-CM | POA: Diagnosis not present

## 2022-07-28 DIAGNOSIS — Z7983 Long term (current) use of bisphosphonates: Secondary | ICD-10-CM

## 2022-07-28 DIAGNOSIS — Z79899 Other long term (current) drug therapy: Secondary | ICD-10-CM | POA: Diagnosis not present

## 2022-07-28 DIAGNOSIS — F322 Major depressive disorder, single episode, severe without psychotic features: Secondary | ICD-10-CM | POA: Diagnosis not present

## 2022-07-28 DIAGNOSIS — M419 Scoliosis, unspecified: Secondary | ICD-10-CM | POA: Diagnosis present

## 2022-07-28 DIAGNOSIS — K219 Gastro-esophageal reflux disease without esophagitis: Secondary | ICD-10-CM | POA: Diagnosis not present

## 2022-07-28 DIAGNOSIS — M898X9 Other specified disorders of bone, unspecified site: Secondary | ICD-10-CM | POA: Diagnosis present

## 2022-07-28 DIAGNOSIS — M19011 Primary osteoarthritis, right shoulder: Secondary | ICD-10-CM | POA: Diagnosis not present

## 2022-07-28 DIAGNOSIS — Z9851 Tubal ligation status: Secondary | ICD-10-CM

## 2022-07-28 DIAGNOSIS — Z20822 Contact with and (suspected) exposure to covid-19: Secondary | ICD-10-CM | POA: Diagnosis present

## 2022-07-28 DIAGNOSIS — G4700 Insomnia, unspecified: Secondary | ICD-10-CM | POA: Diagnosis not present

## 2022-07-28 DIAGNOSIS — Z87891 Personal history of nicotine dependence: Secondary | ICD-10-CM

## 2022-07-28 DIAGNOSIS — M62838 Other muscle spasm: Secondary | ICD-10-CM | POA: Diagnosis not present

## 2022-07-28 DIAGNOSIS — R45851 Suicidal ideations: Secondary | ICD-10-CM | POA: Diagnosis not present

## 2022-07-28 DIAGNOSIS — Z833 Family history of diabetes mellitus: Secondary | ICD-10-CM

## 2022-07-28 LAB — SARS CORONAVIRUS 2 BY RT PCR: SARS Coronavirus 2 by RT PCR: NEGATIVE

## 2022-07-28 MED ORDER — ENSURE ENLIVE PO LIQD
237.0000 mL | Freq: Two times a day (BID) | ORAL | Status: DC
  Administered 2022-07-28 – 2022-08-03 (×12): 237 mL via ORAL
  Filled 2022-07-28 (×14): qty 237

## 2022-07-28 MED ORDER — ADULT MULTIVITAMIN W/MINERALS CH
1.0000 | ORAL_TABLET | Freq: Every day | ORAL | Status: DC
  Administered 2022-07-28 – 2022-08-03 (×7): 1 via ORAL
  Filled 2022-07-28 (×9): qty 1

## 2022-07-28 MED ORDER — NICOTINE POLACRILEX 2 MG MT GUM
2.0000 mg | CHEWING_GUM | OROMUCOSAL | Status: DC | PRN
  Administered 2022-07-28 – 2022-08-02 (×3): 2 mg via ORAL
  Filled 2022-07-28 (×4): qty 1

## 2022-07-28 MED ORDER — FERROUS GLUCONATE 324 (38 FE) MG PO TABS
324.0000 mg | ORAL_TABLET | Freq: Every day | ORAL | Status: DC
  Administered 2022-07-29 – 2022-08-03 (×6): 324 mg via ORAL
  Filled 2022-07-28 (×7): qty 1

## 2022-07-28 MED ORDER — SERTRALINE HCL 50 MG PO TABS
50.0000 mg | ORAL_TABLET | Freq: Every day | ORAL | Status: DC
  Administered 2022-07-28 – 2022-08-02 (×6): 50 mg via ORAL
  Filled 2022-07-28 (×7): qty 1

## 2022-07-28 MED ORDER — MAGNESIUM HYDROXIDE 400 MG/5ML PO SUSP: 30.0000 mL | Freq: Every day | ORAL | Status: DC | PRN

## 2022-07-28 MED ORDER — ALUM & MAG HYDROXIDE-SIMETH 200-200-20 MG/5ML PO SUSP: 30.0000 mL | ORAL | Status: DC | PRN

## 2022-07-28 MED ORDER — KETOROLAC TROMETHAMINE 0.5 % OP SOLN: 1.0000 [drp] | Freq: Every day | OPHTHALMIC | Status: DC | PRN

## 2022-07-28 MED ORDER — APREMILAST 30 MG PO TABS
30.0000 mg | ORAL_TABLET | Freq: Two times a day (BID) | ORAL | Status: DC
  Administered 2022-07-28 – 2022-08-02 (×11): 30 mg via ORAL

## 2022-07-28 MED ORDER — METHOCARBAMOL 500 MG PO TABS
500.0000 mg | ORAL_TABLET | Freq: Three times a day (TID) | ORAL | Status: DC | PRN
  Administered 2022-07-30: 500 mg via ORAL
  Filled 2022-07-28 (×2): qty 1

## 2022-07-28 MED ORDER — TRAZODONE HCL 50 MG PO TABS
50.0000 mg | ORAL_TABLET | Freq: Every evening | ORAL | Status: DC | PRN
  Filled 2022-07-28: qty 1

## 2022-07-28 MED ORDER — TRAMADOL HCL 50 MG PO TABS
50.0000 mg | ORAL_TABLET | Freq: Two times a day (BID) | ORAL | Status: DC
  Administered 2022-07-28 – 2022-08-03 (×12): 50 mg via ORAL
  Filled 2022-07-28 (×13): qty 1

## 2022-07-28 MED ORDER — HYDROXYZINE HCL 25 MG PO TABS
25.0000 mg | ORAL_TABLET | Freq: Three times a day (TID) | ORAL | Status: DC | PRN
  Filled 2022-07-28: qty 10

## 2022-07-28 MED ORDER — ALENDRONATE SODIUM 70 MG PO TABS: 70.0000 mg | ORAL_TABLET | ORAL | Status: DC

## 2022-07-28 MED ORDER — OYSTER SHELL CALCIUM/D3 500-5 MG-MCG PO TABS
1.0000 | ORAL_TABLET | Freq: Every day | ORAL | Status: DC
  Administered 2022-07-29 – 2022-08-03 (×6): 1 via ORAL
  Filled 2022-07-28 (×7): qty 1

## 2022-07-28 MED ORDER — PANTOPRAZOLE SODIUM 40 MG PO TBEC
40.0000 mg | DELAYED_RELEASE_TABLET | Freq: Every day | ORAL | Status: DC
  Administered 2022-07-28 – 2022-08-03 (×7): 40 mg via ORAL
  Filled 2022-07-28 (×9): qty 1

## 2022-07-28 NOTE — [facility] Suicide Risk Assessment (Signed)
Annette Chang INPATIENT:  Family/Significant Other Suicide Prevention Education  Suicide Prevention Education:  Education Completed; 07-28-2022, Annette Chang [phone] (Mother)has been identified by the patient as the family member/significant other with whom the patient will be residing, and identified as the person(s) who will aid the patient in the event of a mental health crisis (suicidal ideations/suicide attempt).  With written consent from the Annette Chang [phone] (Mother) has been provided the following suicide prevention education, prior to the and/or following the discharge of the patient.  The suicide prevention education provided includes the following: Suicide risk factors Suicide prevention and interventions National Suicide Hotline telephone number [facility] assessment telephone number [facility] Emergency Assistance [place] and/or Residential Mobile Crisis Unit telephone number  Request made of family/significant other to: Remove weapons (e.g., guns, rifles, knives), all items previously/currently identified as safety concern.   Remove drugs/medications (over-the-counter, prescriptions, illicit drugs), all items previously/currently identified as a safety concern.  Annette Chang [phone] (Mother) verbalizes understanding of the suicide prevention education information provided.  The family member/significant other agrees to remove the items of safety concern listed above.  Annette Chang 07/28/2022, 3:44 PM

## 2022-07-28 NOTE — Group Note (Signed)
[facility] LCSW Group Therapy Note   Group Date: 07/28/2022 Start Time: 1100 End Time: 1200   Type of Therapy/Topic:  Group Therapy:  Balance in Life  Participation Level:  Active   Description of Group:    This group will address the concept of balance and how it feels and looks when one is unbalanced. Patients will be encouraged to process areas in their lives that are out of balance, and identify reasons for remaining unbalanced. Facilitators will guide patients utilizing problem- solving interventions to address and correct the stressor making their life unbalanced. Understanding and applying boundaries will be explored and addressed for obtaining  and maintaining a balanced life. Patients will be encouraged to explore ways to assertively make their unbalanced needs known to significant others in their lives, using other group members and facilitator for support and feedback.  Therapeutic Goals: Patient will identify two or more emotions or situations they have that consume much of in their lives. Patient will identify signs/triggers that life has become out of balance:  Patient will identify two ways to set boundaries in order to achieve balance in their lives:  Patient will demonstrate ability to communicate their needs through discussion and/or role plays  Summary of Patient Progress:        Therapeutic Modalities:   Cognitive Behavioral Therapy Solution-Focused Therapy Assertiveness Training   [place], LCSW

## 2022-07-28 NOTE — Tx Team (Signed)
Initial Treatment Plan 07/28/2022 3:41 AM Annette Chang ZHG:[id number]    PATIENT STRESSORS: Health problems     PATIENT STRENGTHS: Motivation for treatment/growth  Supportive family/friends    PATIENT IDENTIFIED PROBLEMS: Declining health  Chronic pain  Depression                 DISCHARGE CRITERIA:  Improved stabilization in mood, thinking, and/or behavior Reduction of life-threatening or endangering symptoms to within safe limits  PRELIMINARY DISCHARGE PLAN: Return to previous living arrangement  PATIENT/FAMILY INVOLVEMENT: This treatment plan has been presented to and reviewed with the patient, Annette Chang. The patient has been given the opportunity to ask questions and make suggestions.  Azucena Cecil, RN 07/28/2022, 3:41 AM

## 2022-07-28 NOTE — Plan of Care (Signed)
  Problem: Safety: Goal: Ability to identify and utilize support systems that promote safety will improve Outcome: Progressing   Problem: Safety: Goal: Periods of time without injury will increase Outcome: Progressing

## 2022-07-28 NOTE — [facility] Suicide Risk Assessment (Addendum)
Suicide Risk Assessment  Admission Assessment    [facility] Admission Suicide Risk Assessment   Nursing information obtained from:  Patient Demographic factors:  NA Current Mental Status:  NA Loss Factors:  Decline in physical health Historical Factors:  NA Risk Reduction Factors:  Positive social support, Sense of responsibility to family  Total Time spent with patient: 1 hour Principal Problem: MDD (major depressive disorder), severe ([place]) Diagnosis:  Principal Problem:   MDD (major depressive disorder), severe ([place])  Subjective Data: Annette Chang is a 52 y.o. AA female with past psychiatric history of depression, suicidal ideation and past medical history of arthritis, poliosis, chronic pain, and anemia, presents voluntarily to [facility] from [facility] ED for worsening depression with suicidal ideation x 2 weeks. States she was lying in her bed at home "overwhelmed with my life and seeing myself in a casket". She does not have a plan to hurt herself now, however, is worried what she might do.  Patient called her neighbor due to having suicidal thoughts, and neighbor encouraged her to go to [facility] emergency room for evaluation.  Patient reports cocaine and alcohol use, and being compliant with her prescribed psychoactive medications.  She reports prior mental health hospitalization at [facility] about 20 years ago.  Due to clinical factors indicated below, patient is admitted for mood stabilization, medication management, and safety.  Continued Clinical Symptoms:  Alcohol Use Disorder Identification Test Final Score (AUDIT): 11 The "Alcohol Use Disorders Identification Test", Guidelines for Use in Primary Care, Second Edition.  World Pharmacologist [facility]). Score between 0-7:  no or low risk or alcohol related problems. Score between 8-15:  moderate risk of alcohol related problems. Score between 16-19:  high risk of alcohol related  problems. Score 20 or above:  warrants further diagnostic evaluation for alcohol dependence and treatment.   CLINICAL FACTORS:   Severe Anxiety and/or Agitation Depression:   Comorbid alcohol abuse/dependence Alcohol/Substance Abuse/Dependencies Chronic Pain More than one psychiatric diagnosis Unstable or Poor Therapeutic Relationship Previous Psychiatric Diagnoses and Treatments  Musculoskeletal: Strength & Muscle Tone: within normal limits Gait & Station: unsteady Patient leans: N/A  Psychiatric Specialty Exam:  Presentation  General Appearance:  Appropriate for Environment; Casual; Fairly Groomed  Eye Contact: Good  Speech: Clear and Coherent  Speech Volume: Normal  Handedness: Right  Mood and Affect  Mood: Anxious; Depressed; Hopeless  Affect: Appropriate; Blunt  Thought Process  Thought Processes: Coherent  Descriptions of Associations:Intact  Orientation:Full (Time, Place and Person)  Thought Content:Logical  History of Schizophrenia/Schizoaffective disorder:No data recorded Duration of Psychotic Symptoms:No data recorded Hallucinations:Hallucinations: None  Ideas of Reference:None  Suicidal Thoughts:Suicidal Thoughts: No SI Passive Intent and/or Plan: -- (Patient denies)  Homicidal Thoughts:Homicidal Thoughts: No  Sensorium  Memory: Immediate Fair; Recent Fair  Judgment: Fair  Insight: Fair  Community education officer  Concentration: Good  Attention Span: Good  Recall: Heil of Knowledge: Fair  Language: Good  Psychomotor Activity  Psychomotor Activity: Psychomotor Activity: Normal (Patient requires walker with ambulation)  Assets  Assets: Communication Skills; Housing; Social Support; Physical Health  Sleep  Sleep: Sleep: Good Number of Hours of Sleep: 5  Physical Exam: Physical Exam Vitals and nursing note reviewed.  Constitutional:      Appearance: Normal appearance.  HENT:     Head: Normocephalic.      Right Ear: External ear normal.     Left Ear: External ear normal.     Nose: Nose normal.  Mouth/Throat:     Mouth: Mucous membranes are moist.     Pharynx: Oropharynx is clear.  Eyes:     Extraocular Movements: Extraocular movements intact.     Conjunctiva/sclera: Conjunctivae normal.     Pupils: Pupils are equal, round, and reactive to light.  Cardiovascular:     Rate and Rhythm: Normal rate.     Pulses: Normal pulses.  Pulmonary:     Effort: Pulmonary effort is normal.  Abdominal:     Palpations: Abdomen is soft.  Genitourinary:    Comments: Deferred Musculoskeletal:        General: Normal range of motion.     Cervical back: Normal range of motion.  Skin:    General: Skin is warm.  Neurological:     General: No focal deficit present.     Mental Status: She is alert and oriented to person, place, and time.  Psychiatric:        Behavior: Behavior normal.    Review of Systems  Constitutional: Negative.   HENT: Negative.    Eyes: Negative.   Respiratory: Negative.    Cardiovascular: Negative.   Gastrointestinal: Negative.   Genitourinary: Negative.   Musculoskeletal: Negative.   Skin: Negative.   Neurological: Negative.   Endo/Heme/Allergies: Negative.   Psychiatric/Behavioral:  Positive for depression and substance abuse. The patient is nervous/anxious.    Blood pressure 115/85, pulse 70, temperature 98.2 F (36.8 C), temperature source Oral, resp. rate 16, height '5\' 1"'$  (1.549 m), weight 42.2 kg, SpO2 99 %. Body mass index is 17.57 kg/m.   COGNITIVE FEATURES THAT CONTRIBUTE TO RISK:  Polarized thinking    SUICIDE RISK:   Moderate:  Frequent suicidal ideation with limited intensity, and duration, some specificity in terms of plans, no associated intent, good self-control, limited dysphoria/symptomatology, some risk factors present, and identifiable protective factors, including available and accessible social support.  PLAN OF CARE: Treatment Plan  Summary: Daily contact with patient to assess and evaluate symptoms and progress in treatment and Medication management  Observation Level/Precautions:  15 minute checks  Laboratory:  CBC Chemistry Profile HbAIC UDS UA  Psychotherapy:.  Therapeutic milieu  Medications: MAR  Consultations: Pending  Discharge Concerns: Safety  Estimated LOS: 5 to 7 days  Other:     Physician Treatment Plan for Primary Diagnosis: MDD (major depressive disorder), severe ([place]) Long Term Goal(s): Improvement in symptoms so as ready for discharge  Short Term Goals: Ability to identify changes in lifestyle to reduce recurrence of condition will improve, Ability to verbalize feelings will improve, Ability to disclose and discuss suicidal ideas, Ability to demonstrate self-control will improve, Ability to identify and develop effective coping behaviors will improve, Ability to maintain clinical measurements within normal limits will improve, Compliance with prescribed medications will improve, and Ability to identify triggers associated with substance abuse/mental health issues will improve  Plan: Safety and Monitoring: Voluntary admission to inpatient psychiatric unit for safety, stabilization and treatment Daily contact with patient to assess and evaluate symptoms and progress in treatment Patient's case to be discussed in multi-disciplinary team meeting Observation Level : q15 minute checks Vital signs: q12 hours Precautions: suicide, but pt currently verbally contracts for safety on unit   Physician Treatment Plan for Secondary Diagnosis: Principal Problem:   MDD (major depressive disorder), severe ([facility])  Major depressive disorder recurrent severe --Initiate sertraline Zoloft 50 mg p.o. daily at bedtime  Anxiety --Initiate hydroxyzine 25 mg 3 times daily as needed/anxiety   Insomnia --Initiate trazodone 50 mg p.o. nightly  daily as needed  Smoking cessation: --Initiate nicotine replacement  protocol  Scoliosis: -- Initiate apremilast 30 mg p.o. twice daily.  Family members to supply  Calcium deficiency: -- Initiate calcium-vitamin D 500-5 mg-micrograms 30 tablets 1 tablet daily  Nutritional supplement: --Initiate Ensure Enlive/Ensure Plus to 37 meal p.o. 2 times daily between meals  Anemia: -- Initiate ferrous gluconate tablet 324 mg p.o. daily -- Patient multivitamin with minerals 1 tablet p.o. daily  Muscle spasm: --Initiate Robaxin tablet 500 mg p.o. every 8 hours as needed  GERD: --Initiate Protonix EC 40 mg tablet p.o. daily.  Dry/itchy eyes -- Initiate Ocular 0.5% ophthalmic solution 1 drop to left eye daily as needed  Bone pain:  -- Initiate tramadol 50 mg tablet p.o. 2 times daily Patient to use standard walker for ambulation  Other PRN Medications -Acetaminophen 650 mg every 6 as needed/mild pain -Maalox 30 mL oral every 4 as needed/digestion -Magnesium hydroxide 30 mL daily as needed/mild constipation   Discharge Planning: Social work and case management to assist with discharge planning and identification of hospital follow-up needs prior to discharge Estimated LOS: 5-7 days Discharge Concerns: Need to establish a safety plan; Medication compliance and effectiveness Discharge Goals: Return home with outpatient referrals for mental health follow-up including medication management/psychotherapy.   I certify that inpatient services furnished can reasonably be expected to improve the patient's condition.   Laretta Bolster, FNP 07/28/2022, 6:04 PM

## 2022-07-28 NOTE — H&P (Addendum)
Psychiatric Admission Assessment Adult  Patient Identification: Annette Chang  MRN:  [id number]  Date of Evaluation:  07/28/2022  Chief Complaint:  MDD (major depressive disorder), severe ([place]) [F32.2]  Principal Diagnosis: MDD (major depressive disorder), severe ([place])  Diagnosis:  Principal Problem:   MDD (major depressive disorder), severe ([place])  CC: Suicidal ideation x 2 weeks  History of Present Illness: Annette Chang is a 52 y.o. AA female with past psychiatric history of depression, suicidal ideation and past medical history of arthritis, poliosis, chronic pain, and anemia, presents voluntarily to [facility] from [facility] ED for worsening depression with suicidal ideation x 2 weeks. States she was lying in her bed at home "overwhelmed with my life and seeing myself in a casket". She does not have a plan to hurt herself now, however, is worried what she might do.  Patient called her neighbor due to having suicidal thoughts, and neighbor encouraged her to go to [facility] emergency room for evaluation.  Patient reports cocaine and alcohol use, and being compliant with her prescribed psychoactive medications.  Patient reports prior mental health hospitalization at [facility] about 20 years ago.   Assessment: On assessment today patient is seen and examined on [address] sitting up on her bed.  She appears pleasant, calm and participating in the exams.  Chart reviewed and findings shared with the treatment team and discussed with the attending psychiatrist.  Alert and oriented x 4, speech clear coherent with normal volume and pattern.  Present with depressed and anxious mood.  Affect appropriate and congruent.  Reports she was treated for chest pain at [facility] long and also for her chronic arthritis.  Reports maintaining her psychiatric health wellbeing for very long time about 20 years without being hospitalized in behavioral health.  Added that  her husband is currently incarcerated.  Patient did not appear be responding to internal or external stimuli during the assessment.  No delusional thinking or paranoia observed.  Admission labs reviewed with CMP: Total protein 8.7 high otherwise normal.  CBC with differential normal.  Urine drug screen ordered as none noted in chart.  Vital signs reviewed normal.  Due to associated symptoms indicated below, patient is admitted to [facility] adult psychiatric unit for mood stabilization, medication management, and safety.  Patient denies SI, HI or AVH.  PT consult ordered due to chronic arthritis scoliosis and shoulder pain.  Patient only ambulates with a rolling walker.  Patient endorses alcohol drinking of sixpacks of beer twice weekly, marijuana use of 2 g daily, smoking of 3 cigarettes daily, occasional cocaine use, no quantity provided, with last use 3 weeks ago.  Instructions provided on cessation of polysubstance uses due to adverse effects on overall psychiatric and medical wellbeing.  Patient nods and smiles in agreement.  Mode of transport to Hospital: Safe transport  Current Outpatient (Home) Medication List: Calcium carbonate antiacid 1 tablet p.o. daily 4 days Zoloft 50 mg tablets 1 p.o. daily for depression Vitamin C ascorbic acid 500 g tab p.o. daily Fosamax 70 mg tabs every 7 days take with full glass of water on empty stomach for bone disorder Otezla 30 mg tab by mouth 2 times daily Calcium-vitamin D-vitamin K 500 - 500 - 40 mg unit-microgram to 1 tablet by mouth daily Ferrous gluconate 27 mg p.o. daily Good to rule outs all, 0.4% solution 1 drop to left eye daily as needed Robaxin 500 mg tabs for muscle spasm Nicotine  gum 2 g take 1 by mouth as needed for smoking cessation Omeprazole 20 mg capsule 1 capsule daily p.o. for GERD tramadol 50 mg tablets 1 p.o. 2 times daily for pain   ED course:Labs: ordered. Radiology: ordered and independent  interpretation performed. ECG/medicine tests: ordered and independent interpretation performed. EKG shows new T wave inversion which was not present on most recent EKG but was present in 2021. Chest pain has been constant for the past 2 weeks and is reproducible.  Chest x-ray is negative for fracture or pneumothorax.Troponin negative x 1.  Low suspicion for ACS.   Collateral Information: No collateral information.  Patient states spouse is currently in prison.  POA/Legal Guardian:  Past Psychiatric Hx: Previous Psych Diagnoses: Major depressive disorder severe and suicidal ideation Prior inpatient treatment: Yes, [facility] ago Current/prior outpatient treatment: Yes, seen therapies about 3 years ago Prior rehab hx: Not applicable, patient denies Psychotherapy hx: Patient denies History of suicide: Patient reports suicidal thoughts History of homicide or aggression: Patient denies Psychiatric medication history: Patient taking Zoloft for depression Psychiatric medication compliance history: Yes, patient compliant with medications Neuromodulation history: Not applicable Current Psychiatrist: Patient denies Current therapist: Not currently  Substance Abuse Hx: Alcohol: Yes, consumes sixpacks twice a week Tobacco: 3 cigarettes daily Illicit drugs: Marijuana use, 2 g daily Rx drug abuse: Cocaine use, then the last use 3 times a week Rehab hx: Yes, about 20 years ago  Past Medical History: Medical Diagnoses: Scoliosis Home Rx: [place]: Patient was hospitalized for bone problem in 2010 Prior Surgeries/Trauma: Patient had 6 surgeries on her right side for rotator cuff Head trauma, LOC, concussions, seizures: No Allergies:          Reaction Severity Reaction Type Noted         Allergies     Robaxin [Methocarbamol]  Rash Low Allergy 06/02/2015  Denies current allergy    Adverse Reactions/Drug Intolerances     Percocet  [Oxycodone-acetaminophen]  Itching Low Intolerance 5/78/4696   LMP: Not applicable Contraception: Not applicable [facility]  Family History: Medical: Scoliosis, anemia Psych: Patient denies Psych Rx: None denies SA/HA: Denies Substance use family hx: Patient denies  Social History: Childhood (bring, raised, lives now, parents, siblings, schooling, education): Abuse: Denies Marital Status: Married Sexual orientation: Female Children: 2, 1 deceased Employment: Disabled Peer Group: Not applicable Housing: Yes Finances: Yes, stable Legal: Patient denies Military: Patient denies  Associated Signs/Symptoms: Depression Symptoms:  depressed mood, insomnia, fatigue, suicidal thoughts without plan, anxiety, loss of energy/fatigue, disturbed sleep, weight loss, increased appetite,  (Hypo) Manic Symptoms:   Not Applicable  Anxiety Symptoms:   Not applicable  Psychotic Symptoms:   Not Applicable  PTSD Symptoms: N/A  Total Time spent with patient: 1 hour  Past Psychiatric History: Major depressive disorder recurrent severe, suicidal ideations.  Patient was admitted at [facility] about 20 years ago.  Is the patient at risk to self? No.  Has the patient been a risk to self in the past 6 months? No.  Has the patient been a risk to self within the distant past? Yes.    Is the patient a risk to others? No.  Has the patient been a risk to others in the past 6 months? No.  Has the patient been a risk to others within the distant past? No.   [place] Scale:  [place] Admission (Current) from 07/28/2022 in [facility] ED  from 07/27/2022 in [place] DEPT ED from 03/29/2022 in [place] CATEGORY Low Risk Low Risk No Risk        Prior Inpatient Therapy: Yes.   If yes, patient was admitted at [facility] 20 years ago. Prior Outpatient Therapy: Yes.   Patient reports she last saw a therapist 3 years ago.  Alcohol Screening: 1. How often do you have a drink containing alcohol?: 2 to 3 times a week 2. How many drinks containing alcohol do you have on a typical day when you are drinking?: 5 or 6 3. How often do you have six or more drinks on one occasion?: Weekly AUDIT-C Score: 8 4. How often during the last year have you found that you were not able to stop drinking once you had started?: Never 5. How often during the last year have you failed to do what was normally expected from you because of drinking?: Never 6. How often during the last year have you needed a first drink in the morning to get yourself going after a heavy drinking session?: Never 7. How often during the last year have you had a feeling of guilt of remorse after drinking?: Weekly 8. How often during the last year have you been unable to remember what happened the night before because you had been drinking?: Never 9. Have you or someone else been injured as a result of your drinking?: No 10. Has a relative or friend or a doctor or another health worker been concerned about your drinking or suggested you cut down?: No Alcohol Use Disorder Identification Test Final Score (AUDIT): 11 Alcohol Brief Interventions/Follow-up: Alcohol education/Brief advice Substance Abuse History in the last 12 months:  Yes.   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    Past Surgical History:  Procedure Laterality Date   CESAREAN SECTION     3 previous c sections   EYE SURGERY Bilateral    MUSCLE REPAIR Right 01/10/2017   RIGHT PECTORAL MAJOR TO SCAPULA MUSCLE TRANSFER (Right   NOVASURE ABLATION N/A 03/11/2014   Procedure: NOVASURE ABLATION;  Surgeon: Emily Filbert, MD;  Location: [facility];  Service: Gynecology;  Laterality: N/A;   PECTORALIS TENDON REPAIR Right 01/10/2017   Procedure: RIGHT PECTORAL MAJOR TO SCAPULA MUSCLE TRANSFER;  Surgeon: Meredith Pel, MD;  Location: [facility];  Service: Orthopedics;  Laterality: Right;   PECTORALIS TENDON REPAIR Right 12/28/2017   Procedure: RIGHT REVISION TENDON TRANSFER OF PECTORALIS MAJOR;  Surgeon: Meredith Pel, MD;  Location: [facility];  Service: Orthopedics;  Laterality: Right;   rotator cuff surgery     SHOULDER SURGERY     TUBAL LIGATION     Family History:  Family History  Problem Relation Age of Onset   Diabetes Maternal Grandmother    Diabetes Paternal Grandmother    Diabetes Paternal Grandfather    Colon cancer Neg  Hx    Esophageal cancer Neg Hx    Rectal cancer Neg Hx    Stomach cancer Neg Hx    Family Psychiatric  History: Patient denies family psychiatric history  Tobacco Screening:  Social History   Tobacco Use  Smoking Status Former   Years: 5.00   Types: Cigarettes   Quit date: 11/2017   Years since quitting: 4.6   Passive exposure: Never  Smokeless Tobacco Never    BH Tobacco Counseling     Are you interested in Tobacco Cessation Medications?  N/A, patient does not use tobacco products Counseled patient on smoking cessation:  N/A, patient does not use tobacco products Reason Tobacco Screening Not Completed: No value filed.       Social History:  Social History   Substance and Sexual Activity  Alcohol Use Yes   Alcohol/week: 18.0 standard drinks of alcohol   Types: 18 Cans of beer per week   Comment: three times a week drink 6  beers in one sitting     Social History   Substance and Sexual Activity  Drug Use Yes   Types: Cocaine, Marijuana   Comment: marijuana used 3 times a weekly    Additional Social History: Marital status: Married Number of Years Married: 21 What types of issues is patient dealing with in the relationship?: my husband is currently serving a 12 year sentance, he has served 4 so far Are you sexually active?: No What is your sexual orientation?: Straight Has your sexual activity been affected by drugs, alcohol, medication, or emotional stress?: No  Does patient have children?: Yes How many children?: 2 How is patient's relationship with their children?: Patient stated that her relationship with her children is "pretty good" and supportive.      Allergies:   Allergies  Allergen Reactions   Percocet [Oxycodone-Acetaminophen] Itching   Robaxin [Methocarbamol] Rash    Denies current allergy   Lab Results:  Results for orders placed or performed during the hospital encounter of 07/27/22 (from the past 48 hour(s))  Comprehensive metabolic panel     Status: Abnormal   Collection Time: 07/27/22 12:16 PM  Result Value Ref Range   Sodium 139 135 - 145 mmol/L   Potassium 3.5 3.5 - 5.1 mmol/L   Chloride 103 98 - 111 mmol/L   CO2 27 22 - 32 mmol/L   Glucose, Bld 91 70 - 99 mg/dL    Comment: Glucose reference range applies only to samples taken after fasting for at least 8 hours.   BUN 11 6 - 20 mg/dL   Creatinine, Ser 0.47 0.44 - 1.00 mg/dL   Calcium 9.5 8.9 - 10.3 mg/dL   Total Protein 8.7 (H) 6.5 - 8.1 g/dL   Albumin 4.7 3.5 - 5.0 g/dL   AST 25 15 - 41 U/L   ALT 18 0 - 44 U/L   Alkaline Phosphatase 91 38 - 126 U/L   Total Bilirubin 1.1 0.3 - 1.2 mg/dL   GFR, Estimated >60 >60 mL/min    Comment: (NOTE) Calculated using the CKD-EPI Creatinine Equation (2021)    Anion gap 9 5 - 15    Comment: Performed at [facility], [place] [address], [place], [place] [address]   Ethanol     Status: None   Collection Time: 07/27/22 12:16 PM  Result Value Ref Range   Alcohol, Ethyl (B) <10 <10 mg/dL    Comment: (NOTE) Lowest detectable limit for serum alcohol is 10 mg/dL.  For medical purposes  only. Performed at [facility], [place] [address], [place], [place] [address]   CBC with Diff     Status: None   Collection Time: 07/27/22 12:16 PM  Result Value Ref Range   WBC 7.0 4.0 - 10.5 K/uL   RBC 4.88 3.87 - 5.11 MIL/uL   Hemoglobin 14.9 12.0 - 15.0 g/dL   HCT 44.3 36.0 - 46.0 %   MCV 90.8 80.0 - 100.0 fL   MCH 30.5 26.0 - 34.0 pg   MCHC 33.6 30.0 - 36.0 g/dL   RDW 12.9 11.5 - 15.5 %   Platelets 232 150 - 400 K/uL    Comment: SPECIMEN CHECKED FOR CLOTS REPEATED TO VERIFY PLATELET COUNT CONFIRMED BY SMEAR    nRBC 0.0 0.0 - 0.2 %   Neutrophils Relative % 53 %   Neutro Abs 3.8 1.7 - 7.7 K/uL   Lymphocytes Relative 33 %   Lymphs Abs 2.3 0.7 - 4.0 K/uL   Monocytes Relative 12 %   Monocytes Absolute 0.8 0.1 - 1.0 K/uL   Eosinophils Relative 1 %   Eosinophils Absolute 0.1 0.0 - 0.5 K/uL   Basophils Relative 1 %   Basophils Absolute 0.0 0.0 - 0.1 K/uL   Immature Granulocytes 0 %   Abs Immature Granulocytes 0.02 0.00 - 0.07 K/uL    Comment: Performed at [facility], [place] [address] [place], [place] [address]  Troponin I (High Sensitivity)     Status: None   Collection Time: 07/27/22 12:26 PM  Result Value Ref Range   Troponin I (High Sensitivity) 3 <18 ng/L    Comment: (NOTE) Elevated high sensitivity troponin I (hsTnI) values and significant  changes across serial measurements may suggest ACS but many other  chronic and acute conditions are known to elevate hsTnI results.  Refer to the "Links" section for chest pain algorithms and additional  guidance. Performed at [facility], [place] [address], [place],  [address]   I-Stat beta hCG blood, ED     Status: None   Collection Time: 07/27/22  12:31 PM  Result Value Ref Range   I-stat hCG, quantitative <5.0 <5 mIU/mL   Comment 3            Comment:   GEST. AGE      CONC.  (mIU/mL)   <=1 WEEK        5 - 50     2 WEEKS       50 - 500     3 WEEKS       100 - 10,000     4 WEEKS     1,000 - 30,000        FEMALE AND NON-PREGNANT FEMALE:     LESS THAN 5 mIU/mL   Urine rapid drug screen (hosp performed)     Status: Abnormal   Collection Time: 07/27/22 12:58 PM  Result Value Ref Range   Opiates NONE DETECTED NONE DETECTED   Cocaine POSITIVE (A) NONE DETECTED   Benzodiazepines NONE DETECTED NONE DETECTED   Amphetamines NONE DETECTED NONE DETECTED   Tetrahydrocannabinol POSITIVE (A) NONE DETECTED   Barbiturates NONE DETECTED NONE DETECTED    Comment: (NOTE) DRUG SCREEN FOR MEDICAL PURPOSES ONLY.  IF CONFIRMATION IS NEEDED FOR ANY PURPOSE, NOTIFY LAB WITHIN 5 DAYS.  LOWEST DETECTABLE LIMITS FOR URINE DRUG SCREEN Drug Class                     Cutoff (ng/mL)  Amphetamine and metabolites    1000 Barbiturate and metabolites    200 Benzodiazepine                 200 Opiates and metabolites        300 Cocaine and metabolites        300 THC                            50 Performed at [facility], [place] [address] [place], [place] [address]   SARS Coronavirus 2 by RT PCR (hospital order, performed in [facility] hospital lab) *cepheid single result test* Anterior Nasal Swab     Status: None   Collection Time: 07/27/22  7:55 PM   Specimen: Anterior Nasal Swab  Result Value Ref Range   SARS Coronavirus 2 by RT PCR NEGATIVE NEGATIVE    Comment: (NOTE) SARS-CoV-2 target nucleic acids are NOT DETECTED.  The SARS-CoV-2 RNA is generally detectable in upper and lower respiratory specimens during the acute phase of infection. The lowest concentration of SARS-CoV-2 viral copies this assay can detect is 250 copies / mL. A negative result does not preclude SARS-CoV-2 infection and should not be used as the sole basis  for treatment or other patient management decisions.  A negative result may occur with improper specimen collection / handling, submission of specimen other than nasopharyngeal swab, presence of viral mutation(s) within the areas targeted by this assay, and inadequate number of viral copies (<250 copies / mL). A negative result must be combined with clinical observations, patient history, and epidemiological information.  Fact Sheet for Patients:   https://www.patel.info/  Fact Sheet for Healthcare Providers: https://hall.com/  This test is not yet approved or  cleared by the [place] FDA and has been authorized for detection and/or diagnosis of SARS-CoV-2 by FDA under an Emergency Use Authorization (EUA).  This EUA will remain in effect (meaning this test can be used) for the duration of the COVID-19 declaration under Section 564(b)(1) of the Act, 21 U.S.C. section 360bbb-3(b)(1), unless the authorization is terminated or revoked sooner.  Performed at [facility], [place] [address], [place], [place] [address]    Blood Alcohol level:  Lab Results  Component Value Date   [facility] <10 07/27/2022   ETH <5 85/08/7739   Metabolic Disorder Labs:  Lab Results  Component Value Date   HGBA1C 5.2 06/02/2021   No results found for: "PROLACTIN" Lab Results  Component Value Date   CHOL 213 (H) 07/22/2019   TRIG 128 07/22/2019   HDL 85 07/22/2019   CHOLHDL 2.5 07/22/2019   LDLCALC 106 (H) 07/22/2019    Current Medications: Current Facility-Administered Medications  Medication Dose Route Frequency Provider Last Rate Last Admin   alum & mag hydroxide-simeth (MAALOX/MYLANTA) 200-200-20 MG/5ML suspension 30 mL  30 mL Oral Q4H PRN Onuoha, Chinwendu V, NP       Apremilast TABS 30 mg  30 mg Oral BID Massengill, Ovid Curd, MD       Derrill Memo ON 07/29/2022] calcium-vitamin D (OSCAL WITH D) 500-5 MG-MCG per tablet 1 tablet  1 tablet Oral Daily  Massengill, Nathan, MD       feeding supplement (ENSURE ENLIVE / ENSURE PLUS) liquid 237 mL  237 mL Oral BID BM Massengill, Ovid Curd, MD   237 mL at 07/28/22 1601   [START ON 07/29/2022] ferrous gluconate (FERGON) tablet 324 mg  324 mg Oral Daily Massengill, Ovid Curd, MD  hydrOXYzine (ATARAX) tablet 25 mg  25 mg Oral TID PRN Onuoha, Chinwendu V, NP       ketorolac (ACULAR) 0.5 % ophthalmic solution 1 drop  1 drop Left Eye Daily PRN Massengill, Ovid Curd, MD       magnesium hydroxide (MILK OF MAGNESIA) suspension 30 mL  30 mL Oral Daily PRN Onuoha, Chinwendu V, NP       methocarbamol (ROBAXIN) tablet 500 mg  500 mg Oral Q8H PRN Massengill, Ovid Curd, MD       multivitamin with minerals tablet 1 tablet  1 tablet Oral Daily Massengill, Nathan, MD   1 tablet at 07/28/22 1601   nicotine polacrilex (NICORETTE) gum 2 mg  2 mg Oral PRN Janine Limbo, MD   2 mg at 07/28/22 1811   pantoprazole (PROTONIX) EC tablet 40 mg  40 mg Oral Daily Massengill, Ovid Curd, MD   40 mg at 07/28/22 1811   sertraline (ZOLOFT) tablet 50 mg  50 mg Oral QHS Onuoha, Chinwendu V, NP       traMADol (ULTRAM) tablet 50 mg  50 mg Oral BID Massengill, Ovid Curd, MD   50 mg at 07/28/22 1811   traZODone (DESYREL) tablet 50 mg  50 mg Oral QHS PRN Onuoha, Chinwendu V, NP       PTA Medications: Medications Prior to Admission  Medication Sig Dispense Refill Last Dose   alendronate (FOSAMAX) 70 MG tablet Take 1 tablet (70 mg total) by mouth every 7 (seven) days. Take with a full glass of water on an empty stomach. 4 tablet 4    Apremilast (OTEZLA) 30 MG TABS Take 1 tablet (30 mg total) by mouth 2 (two) times daily. 28 tablet 12    Calcium Carbonate Antacid (ALKA-SELTZER ANTACID PO) Take 1 tablet by mouth daily as needed (gas, flatulance).      Calcium-Vitamin D-Vitamin K 681-275-17 MG-UNT-MCG CHEW Chew 1 tablet by mouth daily.      Ferrous Gluconate (IRON 27 PO) Take 27 mg by mouth daily.      ketorolac (ACULAR) 0.4 % SOLN Place 1 drop into the  left eye daily as needed (pain).      methocarbamol (ROBAXIN) 500 MG tablet TAKE ONE TABLET BY MOUTH every 8 HOURS AS NEEDED FOR muscle SPASMS (Patient taking differently: Take 500 mg by mouth every 8 (eight) hours as needed for muscle spasms.) 30 tablet 0    nicotine polacrilex (RA NICOTINE GUM) 2 MG gum Take 1 each (2 mg total) by mouth as needed for smoking cessation. 20 each 1    omeprazole (PRILOSEC) 20 MG capsule TAKE ONE CAPSULE BY MOUTH EVERY DAY 30 capsule PRN    sertraline (ZOLOFT) 50 MG tablet TAKE ONE TABLET BY MOUTH DAILY (Patient taking differently: Take 50 mg by mouth daily.) 90 tablet 3    traMADol (ULTRAM) 50 MG tablet Take 1 tablet (50 mg total) by mouth 2 (two) times daily. 30 tablet 0    vitamin C (ASCORBIC ACID) 500 MG tablet Take 500 mg by mouth daily.       Musculoskeletal: Strength & Muscle Tone: within normal limits Gait & Station: unsteady Patient leans: N/A  Psychiatric Specialty Exam:  Presentation  General Appearance:  Appropriate for Environment; Casual; Fairly Groomed  Eye Contact: Good  Speech: Clear and Coherent  Speech Volume: Normal  Handedness: Right  Mood and Affect  Mood: Anxious; Depressed; Hopeless  Affect: Appropriate; Blunt  Thought Process  Thought Processes: Coherent  Duration of Psychotic Symptoms:N/A Past Diagnosis of Schizophrenia or  Psychoactive disorder: No data recorded Descriptions of Associations:Intact  Orientation:Full (Time, Place and Person)  Thought Content:Logical  Hallucinations:Hallucinations: None  Ideas of Reference:None  Suicidal Thoughts:Suicidal Thoughts: No SI Passive Intent and/or Plan: -- (Patient denies)  Homicidal Thoughts:Homicidal Thoughts: No  Sensorium  Memory: Immediate Fair; Recent Fair  Judgment: Fair  Insight: Fair  Community education officer  Concentration: Good  Attention Span: Good  Recall: [place] of Knowledge: Fair  Language: Good  Psychomotor Activity   Psychomotor Activity: Psychomotor Activity: Normal (Patient requires walker with ambulation)  Assets  Assets: Communication Skills; Housing; Social Support; Physical Health  Sleep  Sleep: Sleep: Good Number of Hours of Sleep: 5  Physical Exam: Physical Exam Vitals and nursing note reviewed.  Constitutional:      Appearance: Normal appearance.  HENT:     Head: Normocephalic.     Right Ear: External ear normal.     Left Ear: External ear normal.     Nose: Nose normal.     Mouth/Throat:     Mouth: Mucous membranes are moist.     Pharynx: Oropharynx is clear.  Eyes:     Conjunctiva/sclera: Conjunctivae normal.     Pupils: Pupils are equal, round, and reactive to light.  Cardiovascular:     Rate and Rhythm: Normal rate.     Pulses: Normal pulses.  Pulmonary:     Effort: Pulmonary effort is normal.  Abdominal:     Palpations: Abdomen is soft.  Genitourinary:    Comments: Deferred Musculoskeletal:        General: Normal range of motion.     Cervical back: Normal range of motion.     Comments: Patient ambulates with a walker due to scoliosis  Skin:    General: Skin is warm.  Neurological:     General: No focal deficit present.     Mental Status: She is alert and oriented to person, place, and time.  Psychiatric:        Behavior: Behavior normal.    Review of Systems  Constitutional: Negative.   HENT: Negative.    Eyes: Negative.   Respiratory: Negative.    Cardiovascular: Negative.   Genitourinary: Negative.   Musculoskeletal:        Patient ambulates with a walker due to scoliosis  Skin: Negative.   Neurological: Negative.   Endo/Heme/Allergies: Negative.   Psychiatric/Behavioral:  Positive for depression and substance abuse. The patient is nervous/anxious and has insomnia.    Blood pressure 115/85, pulse 70, temperature 98.2 F (36.8 C), temperature source Oral, resp. rate 16, height '5\' 1"'$  (1.549 m), weight 42.2 kg, SpO2 99 %. Body mass index is 17.57  kg/m.  Treatment Plan Summary: Daily contact with patient to assess and evaluate symptoms and progress in treatment and Medication management  Observation Level/Precautions:  15 minute checks  Laboratory:  CBC Chemistry Profile HbAIC UDS UA  Psychotherapy:.  Therapeutic milieu  Medications: MAR  Consultations: Pending  Discharge Concerns: Safety  Estimated LOS: 5 to 7 days  Other:     Physician Treatment Plan for Primary Diagnosis: MDD (major depressive disorder), severe ([facility]) Long Term Goal(s): Improvement in symptoms so as ready for discharge  Short Term Goals: Ability to identify changes in lifestyle to reduce recurrence of condition will improve, Ability to verbalize feelings will improve, Ability to disclose and discuss suicidal ideas, Ability to demonstrate self-control will improve, Ability to identify and develop effective coping behaviors will improve, Ability to maintain clinical measurements within normal limits will improve, Compliance  with prescribed medications will improve, and Ability to identify triggers associated with substance abuse/mental health issues will improve  Plan: Safety and Monitoring: Voluntary admission to inpatient psychiatric unit for safety, stabilization and treatment Daily contact with patient to assess and evaluate symptoms and progress in treatment Patient's case to be discussed in multi-disciplinary team meeting Observation Level : q15 minute checks Vital signs: q12 hours Precautions: suicide, but pt currently verbally contracts for safety on unit   Physician Treatment Plan for Secondary Diagnosis: Principal Problem:   MDD (major depressive disorder), severe ([facility])  Major depressive disorder recurrent severe --Initiate sertraline Zoloft 50 mg p.o. daily at bedtime  Anxiety --Initiate hydroxyzine 25 mg 3 times daily as needed/anxiety   Insomnia --Initiate trazodone 50 mg p.o. nightly daily as needed  Smoking cessation: --Initiate  nicotine replacement protocol  Scoliosis: -- Initiate apremilast 30 mg p.o. twice daily.  Family members to supply  Calcium deficiency: -- Initiate calcium-vitamin D 500-5 mg-micrograms 30 tablets 1 tablet daily  Nutritional supplement: --Initiate Ensure Enlive/Ensure Plus to 37 meal p.o. 2 times daily between meals  Anemia: -- Initiate ferrous gluconate tablet 324 mg p.o. daily -- Patient multivitamin with minerals 1 tablet p.o. daily  Muscle spasm: --Initiate Robaxin tablet 500 mg p.o. every 8 hours as needed  GERD: --Initiate Protonix EC 40 mg tablet p.o. daily.  Dry/itchy eyes -- Initiate Ocular 0.5% ophthalmic solution 1 drop to left eye daily as needed  Bone pain:  -- Initiate tramadol 50 mg tablet p.o. 2 times daily Patient to use standard walker for ambulation  Other PRN Medications -Acetaminophen 650 mg every 6 as needed/mild pain -Maalox 30 mL oral every 4 as needed/digestion -Magnesium hydroxide 30 mL daily as needed/mild constipation   Discharge Planning: Social work and case management to assist with discharge planning and identification of hospital follow-up needs prior to discharge Estimated LOS: 5-7 days Discharge Concerns: Need to establish a safety plan; Medication compliance and effectiveness Discharge Goals: Return home with outpatient referrals for mental health follow-up including medication management/psychotherapy.  I certify that inpatient services furnished can reasonably be expected to improve the patient's condition.    Laretta Bolster, FNP 1/4/20246:18 PM
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
## 2022-07-28 NOTE — Progress Notes (Signed)
Admission Note:   Annette Chang is a 52 y.o. female with a history of arthritis, chronic pain, anemia presenting with depression and thoughts of wanting to hurt herself. Symptoms worsening for the past 2 to weeks. States has been lying in bed "overwhelmed with her life and "seeing myself in a casket". Pt verbalized SI with no plan in the emergency department. She states her stressors is her declining health. She states she has chronic pain. She currently denies SI. Denies HI or AVH. She verbalized using cocaine and marijuana and drinking alcohol three times a week. Pt states she drinks a 6 pack of beer when she drinks. Denies smoking tobacco.   Pt states she lives with her daughter, and her mom is another great support system. Denies hx of verbal, sexual, and physical abuse. Pt placed on high fall risk, she verbalized a frequent history of falls and has a walker at home.Pt very minimal during assessment and stated she is very sleepy.   Skin was assessed and findings documented in flowsheets. Pt searched and no contraband found, POC and unit policies explained and understanding verbalized. Consents obtained. Food and fluids offered, and fluids accepted. Pt had no additional questions or concerns.

## 2022-07-28 NOTE — Progress Notes (Signed)
NUTRITION ASSESSMENT  Pt identified as at risk on the Malnutrition Screen Tool  INTERVENTION: 1. Supplements: Ensure Plus High Protein po BID, each supplement provides 350 kcal and 20 grams of protein.  2. Multivitamin with minerals daily  NUTRITION DIAGNOSIS: Unintentional weight loss related to sub-optimal intake as evidenced by pt report.   Goal: Pt to meet >/= 90% of their estimated nutrition needs.  Monitor:  PO intake  Assessment:  Pt admitted for depression. Pt reports not eating well d/t inability to cook, pt with RA.  Pt uses cocaine, THC and alcohol weekly.  Per weight records, pt with fluctuating weights. Weighed 87 lbs on 06/09/22. Current weight: 93 lbs. Pt is underweight per BMI. Will order Ensure supplements and daily MVI.   Height: Ht Readings from Last 1 Encounters:  07/28/22 '5\' 1"'$  (1.549 m)    Weight: Wt Readings from Last 1 Encounters:  07/28/22 42.2 kg    Weight Hx: Wt Readings from Last 10 Encounters:  07/28/22 42.2 kg  07/27/22 45.4 kg  07/08/22 40.5 kg  06/09/22 39.6 kg  03/29/22 45.4 kg  11/09/21 40.6 kg  10/12/21 43.5 kg  08/17/21 43.2 kg  06/24/21 44.5 kg  06/02/21 41.5 kg    BMI:  Body mass index is 17.57 kg/m. Pt meets criteria for underweight based on current BMI.  Estimated Nutritional Needs: Kcal: 25-30 kcal/kg Protein: > 1 gram protein/kg Fluid: 1 ml/kcal  Diet Order:  Diet Order             Diet regular Room service appropriate? Yes; Fluid consistency: Thin  Diet effective now                  Pt is also offered choice of unit snacks mid-morning and mid-afternoon.  Pt is eating as desired.   Lab results and medications reviewed.   Clayton Bibles, MS, RD, LDN Inpatient Clinical Dietitian Contact information available via Amion

## 2022-07-28 NOTE — Progress Notes (Signed)
PHARMACIST - PHYSICIAN COMMUNICATION  CONCERNING: P&T Medication Policy Regarding Oral Bisphosphonates  RECOMMENDATION: Your order for alendronate (Fosamax), ibandronate (Boniva), or risedronate (Actonel) has been discontinued at this time.  If the patient's post-hospital medical condition warrants safe use of this class of drugs, please resume the pre-hospital regimen upon discharge.  DESCRIPTION:  Alendronate (Fosamax), ibandronate (Boniva), and risedronate (Actonel) can cause severe esophageal erosions in patients who are unable to remain upright at least 30 minutes after taking this medication.   Since brief interruptions in therapy are thought to have minimal impact on bone mineral density, the [facility] has established that bisphosphonate orders should be routinely discontinued during hospitalization.   To override this safety policy and permit administration of Boniva, Fosamax, or Actonel in the hospital, prescribers must write "DO NOT HOLD" in the comments section when placing the order for this class of medications.    Royetta Asal, PharmD, BCPS 07/28/2022 5:39 PM

## 2022-07-28 NOTE — Progress Notes (Signed)
CSW requested [facility] Wynonia Hazard, RN to review and [facility] agreed to review. Pt meets inpatient criteria per Arvella Merles, NP. CSW will assist and follow with placement.   Benjaman Kindler, MSW, LCSWA 07/28/2022 12:05 AM

## 2022-07-28 NOTE — Progress Notes (Addendum)
Patient observed sitting in her bed this morning. Patient presents with flat affect and very depressed mood. A&Ox4. Patient states she does not currently have SI or a plan but states her physical pain and condition are big triggers for her depression. Patient states being in chronic pain due to her rheumatoid arthritis causing deformities on fingers and has a torn rotator cuff on R arm causing limited movement. Patient also suffers from scoliosis that causes pain on her hips and joints. Patient states her pain has not been properly managed and is now limited with her ADLS due to her health condition. Patient states she was not eating well due not being able to cook her meals and now requires much more assistance. Daughters offer support but are also busy. Providers made aware of patient's pain. Patient using walker to assist with ambulation. Patient remains cooperative on unit.    07/28/22 0900  Psych Admission Type (Psych Patients Only)  Admission Status Voluntary  Psychosocial Assessment  Patient Complaints Depression  Eye Contact Fair  Facial Expression Worried  Affect Blunted  Speech Logical/coherent  Interaction Assertive  Motor Activity Slow  Appearance/Hygiene In scrubs  Behavior Characteristics Cooperative  Mood Depressed  Thought Process  Coherency WDL  Content WDL  Delusions None reported or observed  Perception WDL  Hallucination None reported or observed  Judgment Limited  Confusion None  Danger to Self  Current suicidal ideation? Denies  Self-Injurious Behavior No self-injurious ideation or behavior indicators observed or expressed   Agreement Not to Harm Self Yes  Description of Agreement verbally contracts for safety  Danger to Others  Danger to Others None reported or observed

## 2022-07-28 NOTE — Progress Notes (Signed)
Psychoeducational Group Note  Date:  07/28/2022 Time:  2028  Group Topic/Focus:  Wrap-Up Group:   The focus of this group is to help patients review their daily goal of treatment and discuss progress on daily workbooks.  Participation Level: Did Not Attend  Participation Quality:  Not Applicable  Affect:  Not Applicable  Cognitive:  Not Applicable  Insight:  Not Applicable  Engagement in Group: Not Applicable  Additional Comments:  The patient did not attend group this evening.   Archie Balboa S 07/28/2022, 8:28 PM

## 2022-07-29 ENCOUNTER — Encounter (HOSPITAL_COMMUNITY): Payer: Self-pay

## 2022-07-29 DIAGNOSIS — F322 Major depressive disorder, single episode, severe without psychotic features: Secondary | ICD-10-CM | POA: Diagnosis not present

## 2022-07-29 LAB — RAPID URINE DRUG SCREEN, HOSP PERFORMED
Amphetamines: NOT DETECTED
Barbiturates: NOT DETECTED
Benzodiazepines: NOT DETECTED
Cocaine: POSITIVE — AB
Opiates: NOT DETECTED
Tetrahydrocannabinol: POSITIVE — AB

## 2022-07-29 NOTE — Progress Notes (Signed)
   07/29/22 0388  15 Minute Checks  Location Bedroom  Visual Appearance Calm  Behavior Composed  Sleep (Behavioral Health Patients Only)  Calculate sleep? (Click Yes once per 24 hr at 0600 safety check) Yes  Documented sleep last 24 hours 6.5

## 2022-07-29 NOTE — Evaluation (Signed)
Physical Therapy Evaluation Patient Details Name: Annette Chang MRN: [id number] DOB: 1971-05-20 Today's Date: 07/29/2022  History of Present Illness  Patient with a history of arthritis, chronic pain, anemia, multiple R shoulder surgeries, anxiety, depression, presenting with depression and thoughts of wanting to hurt herself. transfer to [facility] 07/28/22.  Clinical Impression  Pt admitted with above diagnosis.  Pt currently with functional limitations due to the deficits listed below (see PT Problem List). Pt will benefit from skilled PT to increase their independence and safety with mobility to allow discharge to the venue listed below.     The patient  endorses multiple falls, reports" My legs just give out.' Patient reports that  the RW is very beneficial. Patient requesting RW at Dc. Patient noted to fatigue when performing standing exercises and reports increased pain in legs. Had patient cease standing exercises.  Patient  instructed on LE exercises x 5 reps each in sitting and standing due to fatigue and H/O falls.  Patient  will benefit from a RW vs  Rollator at DC. PT will follow up on another visit.    Recommendations for follow up therapy are one component of a multi-disciplinary discharge planning process, led by the attending physician.  Recommendations may be updated based on patient status, additional functional criteria and insurance authorization.  Follow Up Recommendations No PT follow up      Assistance Recommended at Discharge Set up Supervision/Assistance (on stairs)  Patient can return home with the following  Help with stairs or ramp for entrance;Assistance with cooking/housework    Equipment Recommendations Rolling walker (2 wheels)  Recommendations for Other Services       Functional Status Assessment Patient has had a recent decline in their functional status and demonstrates the ability to make significant improvements in function in a reasonable and predictable  amount of time.     Precautions / Restrictions Precautions Precautions: Fall Precaution Comments: reports multiple falls      Mobility  Bed Mobility Overal bed mobility: Independent                  Transfers Overall transfer level: Modified independent Equipment used: Rolling walker (2 wheels)                    Ambulation/Gait Ambulation/Gait assistance: Modified independent (Device/Increase time) Gait Distance (Feet): 100 Feet (x 2) Assistive device: Rolling walker (2 wheels) Gait Pattern/deviations: Step-to pattern, Step-through pattern Gait velocity: decr     General Gait Details: noted knees slightly flexed  during gait,  also slight  tremors  Stairs            Wheelchair Mobility    Modified Rankin (Stroke Patients Only)       Balance Overall balance assessment: Needs assistance, History of Falls Sitting-balance support: Feet supported, No upper extremity supported Sitting balance-Leahy Scale: Normal     Standing balance support: No upper extremity supported, During functional activity Standing balance-Leahy Scale: Poor                               Pertinent Vitals/Pain Pain Assessment Pain Assessment: 0-10 Pain Score: 7  Pain Location: back and legs Pain Descriptors / Indicators: Aching, Grimacing, Guarding Pain Intervention(s): Monitored during session    Home Living Family/patient expects to be discharged to:: Private residence Living Arrangements: Children;Non-relatives/Friends Available Help at Discharge: Family;Available PRN/intermittently Type of Home: Apartment Home Access: Stairs to enter Entrance Stairs-Rails:  Left;Right Entrance Stairs-Number of Steps: flight   Home Layout: One level Home Equipment: None      Prior Function Prior Level of Function : Independent/Modified Independent                     Hand Dominance        Extremity/Trunk Assessment   Upper Extremity  Assessment Upper Extremity Assessment: RUE deficits/detail RUE Deficits / Details: history of multiple surgeries, decreased elevatio  strength    Lower Extremity Assessment Lower Extremity Assessment: RLE deficits/detail;LLE deficits/detail RLE Deficits / Details: noted tremors with quad activation and resistance, tight hamstrings LLE Deficits / Details: similar to right    Cervical / Trunk Assessment Cervical / Trunk Assessment: Normal  Communication   Communication: No difficulties  Cognition Arousal/Alertness: Awake/alert Behavior During Therapy: WFL for tasks assessed/performed Overall Cognitive Status: Within Functional Limits for tasks assessed                                 General Comments: pleasant and  agreeable to performing exercises        General Comments      Exercises General Exercises - Lower Extremity Ankle Circles/Pumps: AROM Long Arc Quad: AROM, Both, 5 reps, Seated Hip ABduction/ADduction: Both, 15 reps, Standing Hip Flexion/Marching: AROM, Both, 5 reps, Seated, Standing   Assessment/Plan    PT Assessment Patient needs continued PT services  PT Problem List Decreased strength;Decreased mobility;Decreased safety awareness;Decreased range of motion;Decreased balance;Decreased knowledge of use of DME;Decreased knowledge of precautions;Pain       PT Treatment Interventions DME instruction;Therapeutic activities;Gait training;Therapeutic exercise;Patient/family education    PT Goals (Current goals can be found in the Care Plan section)  Acute Rehab PT Goals Patient Stated Goal: to not fall PT Goal Formulation: With patient Time For Goal Achievement: 08/12/22 Potential to Achieve Goals: Good    Frequency Min 1X/week     Co-evaluation               AM-PAC PT "6 Clicks" Mobility  Outcome Measure Help needed turning from your back to your side while in a flat bed without using bedrails?: None Help needed moving from lying on  your back to sitting on the side of a flat bed without using bedrails?: None Help needed moving to and from a bed to a chair (including a wheelchair)?: None Help needed standing up from a chair using your arms (e.g., wheelchair or bedside chair)?: None Help needed to walk in hospital room?: None Help needed climbing 3-5 steps with a railing? : A Lot 6 Click Score: 22    End of Session   Activity Tolerance: Patient tolerated treatment well;Patient limited by fatigue Patient left: in bed Nurse Communication: Mobility status PT Visit Diagnosis: Unsteadiness on feet (R26.81);Repeated falls (R29.6)    Time: 1103-1594 PT Time Calculation (min) (ACUTE ONLY): 23 min   Charges:   PT Evaluation $PT Eval Low Complexity: 1 Low PT Treatments $Therapeutic Exercise: 8-22 mins        [place] [facility] [phone] Weekend [phone]   Claretha Cooper 07/29/2022, 4:35 PM

## 2022-07-29 NOTE — Progress Notes (Signed)
Patient educated and provided with specimen cup for UDS.

## 2022-07-29 NOTE — BH IP Treatment Plan (Signed)
Interdisciplinary Treatment and Diagnostic Plan Update  07/29/2022 Time of Session: [place] MRN: [id number]  Principal Diagnosis: MDD (major depressive disorder), severe ([place])  Secondary Diagnoses: Principal Problem:   MDD (major depressive disorder), severe ([facility])   Current Medications:  Current Facility-Administered Medications  Medication Dose Route Frequency Provider Last Rate Last Admin   alum & mag hydroxide-simeth (MAALOX/MYLANTA) 884-166-06 MG/5ML suspension 30 mL  30 mL Oral Q4H PRN Onuoha, Chinwendu V, NP       Apremilast TABS 30 mg  30 mg Oral BID Massengill, Ovid Curd, MD   30 mg at 07/29/22 0950   calcium-vitamin D (OSCAL WITH D) 500-5 MG-MCG per tablet 1 tablet  1 tablet Oral Daily Massengill, Ovid Curd, MD   1 tablet at 07/29/22 0950   feeding supplement (ENSURE ENLIVE / ENSURE PLUS) liquid 237 mL  237 mL Oral BID BM Massengill, Ovid Curd, MD   237 mL at 07/29/22 0951   ferrous gluconate (FERGON) tablet 324 mg  324 mg Oral Daily Massengill, Ovid Curd, MD   324 mg at 07/29/22 0950   hydrOXYzine (ATARAX) tablet 25 mg  25 mg Oral TID PRN Onuoha, Chinwendu V, NP       ketorolac (ACULAR) 0.5 % ophthalmic solution 1 drop  1 drop Left Eye Daily PRN Massengill, Ovid Curd, MD       magnesium hydroxide (MILK OF MAGNESIA) suspension 30 mL  30 mL Oral Daily PRN Onuoha, Chinwendu V, NP       methocarbamol (ROBAXIN) tablet 500 mg  500 mg Oral Q8H PRN Massengill, Ovid Curd, MD       multivitamin with minerals tablet 1 tablet  1 tablet Oral Daily Massengill, Nathan, MD   1 tablet at 07/29/22 3016   nicotine polacrilex (NICORETTE) gum 2 mg  2 mg Oral PRN Janine Limbo, MD   2 mg at 07/28/22 1811   pantoprazole (PROTONIX) EC tablet 40 mg  40 mg Oral Daily Massengill, Ovid Curd, MD   40 mg at 07/29/22 0950   sertraline (ZOLOFT) tablet 50 mg  50 mg Oral QHS Onuoha, Chinwendu V, NP   50 mg at 07/28/22 2116   traMADol (ULTRAM) tablet 50 mg  50 mg Oral BID Massengill, Ovid Curd, MD   50 mg at 07/29/22  0950   traZODone (DESYREL) tablet 50 mg  50 mg Oral QHS PRN Onuoha, Chinwendu V, NP       PTA Medications: Medications Prior to Admission  Medication Sig Dispense Refill Last Dose   alendronate (FOSAMAX) 70 MG tablet Take 1 tablet (70 mg total) by mouth every 7 (seven) days. Take with a full glass of water on an empty stomach. 4 tablet 4    Apremilast (OTEZLA) 30 MG TABS Take 1 tablet (30 mg total) by mouth 2 (two) times daily. 28 tablet 12    Calcium Carbonate Antacid (ALKA-SELTZER ANTACID PO) Take 1 tablet by mouth daily as needed (gas, flatulance).      Calcium-Vitamin D-Vitamin K 010-932-35 MG-UNT-MCG CHEW Chew 1 tablet by mouth daily.      Ferrous Gluconate (IRON 27 PO) Take 27 mg by mouth daily.      ketorolac (ACULAR) 0.4 % SOLN Place 1 drop into the left eye daily as needed (pain).      methocarbamol (ROBAXIN) 500 MG tablet TAKE ONE TABLET BY MOUTH every 8 HOURS AS NEEDED FOR muscle SPASMS (Patient taking differently: Take 500 mg by mouth every 8 (eight) hours as needed for muscle spasms.) 30 tablet 0    nicotine polacrilex (  RA NICOTINE GUM) 2 MG gum Take 1 each (2 mg total) by mouth as needed for smoking cessation. 20 each 1    omeprazole (PRILOSEC) 20 MG capsule TAKE ONE CAPSULE BY MOUTH EVERY DAY 30 capsule PRN    sertraline (ZOLOFT) 50 MG tablet TAKE ONE TABLET BY MOUTH DAILY (Patient taking differently: Take 50 mg by mouth daily.) 90 tablet 3    traMADol (ULTRAM) 50 MG tablet Take 1 tablet (50 mg total) by mouth 2 (two) times daily. 30 tablet 0    vitamin C (ASCORBIC ACID) 500 MG tablet Take 500 mg by mouth daily.        Patient Stressors: Health problems    Patient Strengths: Motivation for treatment/growth  Supportive family/friends   Treatment Modalities: Medication Management, Group therapy, Case management,  1 to 1 session with clinician, Psychoeducation, Recreational therapy.   Physician Treatment Plan for Primary Diagnosis: MDD (major depressive disorder), severe  ([place]) Long Term Goal(s): Improvement in symptoms so as ready for discharge   Short Term Goals: Ability to identify changes in lifestyle to reduce recurrence of condition will improve Ability to verbalize feelings will improve Ability to disclose and discuss suicidal ideas Ability to demonstrate self-control will improve Ability to identify and develop effective coping behaviors will improve Ability to maintain clinical measurements within normal limits will improve Compliance with prescribed medications will improve Ability to identify triggers associated with substance abuse/mental health issues will improve  Medication Management: Evaluate patient's response, side effects, and tolerance of medication regimen.  Therapeutic Interventions: 1 to 1 sessions, Unit Group sessions and Medication administration.  Evaluation of Outcomes: Progressing  Physician Treatment Plan for Secondary Diagnosis: Principal Problem:   MDD (major depressive disorder), severe ([place])  Long Term Goal(s): Improvement in symptoms so as ready for discharge   Short Term Goals: Ability to identify changes in lifestyle to reduce recurrence of condition will improve Ability to verbalize feelings will improve Ability to disclose and discuss suicidal ideas Ability to demonstrate self-control will improve Ability to identify and develop effective coping behaviors will improve Ability to maintain clinical measurements within normal limits will improve Compliance with prescribed medications will improve Ability to identify triggers associated with substance abuse/mental health issues will improve     Medication Management: Evaluate patient's response, side effects, and tolerance of medication regimen.  Therapeutic Interventions: 1 to 1 sessions, Unit Group sessions and Medication administration.  Evaluation of Outcomes: Progressing   RN Treatment Plan for Primary Diagnosis: MDD (major depressive disorder), severe  ([place]) Long Term Goal(s): Knowledge of disease and therapeutic regimen to maintain health will improve  Short Term Goals: Ability to remain free from injury will improve, Ability to verbalize frustration and anger appropriately will improve, Ability to demonstrate self-control, Ability to participate in decision making will improve, Ability to verbalize feelings will improve, Ability to disclose and discuss suicidal ideas, Ability to identify and develop effective coping behaviors will improve, and Compliance with prescribed medications will improve  Medication Management: RN will administer medications as ordered by provider, will assess and evaluate patient's response and provide education to patient for prescribed medication. RN will report any adverse and/or side effects to prescribing provider.  Therapeutic Interventions: 1 on 1 counseling sessions, Psychoeducation, Medication administration, Evaluate responses to treatment, Monitor vital signs and CBGs as ordered, Perform/monitor CIWA, COWS, AIMS and Fall Risk screenings as ordered, Perform wound care treatments as ordered.  Evaluation of Outcomes: Progressing   LCSW Treatment Plan for Primary Diagnosis: MDD (major depressive disorder),  severe [facility]) Long Term Goal(s): Safe transition to appropriate next level of care at discharge, Engage patient in therapeutic group addressing interpersonal concerns.  Short Term Goals: Engage patient in aftercare planning with referrals and resources, Increase social support, Increase ability to appropriately verbalize feelings, Increase emotional regulation, Facilitate acceptance of mental health diagnosis and concerns, Facilitate patient progression through stages of change regarding substance use diagnoses and concerns, Identify triggers associated with mental health/substance abuse issues, and Increase skills for wellness and recovery  Therapeutic Interventions: Assess for all discharge needs, 1 to 1 time  with Social worker, Explore available resources and support systems, Assess for adequacy in community support network, Educate family and significant other(s) on suicide prevention, Complete Psychosocial Assessment, Interpersonal group therapy.  Evaluation of Outcomes: Progressing   Progress in Treatment: Attending groups: Yes. Participating in groups: Yes. Taking medication as prescribed: Yes. Toleration medication: Yes. Family/Significant other contact made: Yes, individual(s) contacted:  Fermin Schwab [phone] (Mother) Patient understands diagnosis: Yes. Discussing patient identified problems/goals with staff: Yes. Medical problems stabilized or resolved: Yes. Denies suicidal/homicidal ideation: Yes. Issues/concerns per patient self-inventory: Yes. Other:   New problem(s) identified: No, Describe:  None reported  New Short Term/Long Term Goal(s):Medication Stabilization  Patient Goals:  Reduce negative thought patterns  Discharge Plan or Barriers: None Reported  Reason for Continuation of Hospitalization: Anxiety Depression Medication stabilization Suicidal ideation  Estimated Length of Stay: 3-7 days  Last 3 [place] Suicide Severity Risk Score: Flowsheet Row Admission (Current) from 07/28/2022 in [place] [facility] ED from 07/27/2022 in [place] DEPT ED from 03/29/2022 in [place] Low Risk Low Risk No Risk       Last PHQ 2/9 Scores:    07/08/2022    3:27 PM 11/09/2021    2:13 PM 10/12/2021    2:17 PM  Depression screen PHQ 2/9  Decreased Interest 0 1 1  Down, Depressed, Hopeless 0 0 0  PHQ - 2 Score 0 1 1  Altered sleeping 1 0 1  Tired, decreased energy '1 1 2  '$ Change in appetite 0 1 0  Feeling bad or failure about yourself  0 0 0  Trouble concentrating 0 0 1  Moving slowly or fidgety/restless 0 0 0  Suicidal thoughts 0 0 0  PHQ-9  Score '2 3 5  '$ Difficult doing work/chores Somewhat difficult Somewhat difficult Somewhat difficult  detox, medication management for mood stabilization; elimination of SI thoughts; development of comprehensive mental wellness/sobriety plan    Scribe for Treatment Team: Windle Guard, LCSW 07/29/2022 1:09 PM

## 2022-07-29 NOTE — Progress Notes (Signed)
   07/28/22 2116  Psych Admission Type (Psych Patients Only)  Admission Status Voluntary  Psychosocial Assessment  Patient Complaints None  Eye Contact Fair  Facial Expression Flat  Affect Blunted  Speech Logical/coherent  Interaction Assertive  Motor Activity Slow  Appearance/Hygiene Unremarkable  Behavior Characteristics Cooperative  Mood Depressed;Sad  Thought Process  Coherency WDL  Content WDL  Delusions None reported or observed  Perception WDL  Hallucination None reported or observed  Judgment Limited  Confusion None  Danger to Self  Current suicidal ideation? Denies  Description of Suicide Plan No plan  Self-Injurious Behavior No self-injurious ideation or behavior indicators observed or expressed   Agreement Not to Harm Self Yes  Description of Agreement Verbally contracts for safety.  Danger to Others  Danger to Others None reported or observed   Patient alert and oriented. Presenting with a blunted affect and depressed, sad mood. Patient denies SI, HI, AVH, and pain. Patient denies anxiety and depression at this time. Scheduled medication administered to patient, per provider orders. Support and encouragement provided. Routine safety checks conducted every 15 minutes. Patient verbally contracts for safety.  Patient receptive, calm, and cooperative and remains safe on the unit.

## 2022-07-29 NOTE — Plan of Care (Signed)
  Problem: Education: Goal: Utilization of techniques to improve thought processes will improve Outcome: Progressing Goal: Knowledge of the prescribed therapeutic regimen will improve Outcome: Progressing   Problem: Activity: Goal: Interest or engagement in leisure activities will improve Outcome: Progressing Goal: Imbalance in normal sleep/wake cycle will improve Outcome: Progressing   Problem: Coping: Goal: Coping ability will improve Outcome: Progressing Goal: Will verbalize feelings Outcome: Progressing   Problem: Health Behavior/Discharge Planning: Goal: Ability to make decisions will improve Outcome: Progressing Goal: Compliance with therapeutic regimen will improve Outcome: Progressing   Problem: Role Relationship: Goal: Will demonstrate positive changes in social behaviors and relationships Outcome: Progressing   Problem: Safety: Goal: Ability to disclose and discuss suicidal ideas will improve Outcome: Progressing Goal: Ability to identify and utilize support systems that promote safety will improve Outcome: Progressing   Problem: Self-Concept: Goal: Will verbalize positive feelings about self Outcome: Progressing Goal: Level of anxiety will decrease Outcome: Progressing   Problem: Education: Goal: Knowledge of [place] [facility] Education information/materials will improve Outcome: Progressing Goal: Emotional status will improve Outcome: Progressing Goal: Mental status will improve Outcome: Progressing Goal: Verbalization of understanding the information provided will improve Outcome: Progressing

## 2022-07-29 NOTE — Progress Notes (Signed)
D: Patient alert and oriented, able to make needs known. Denies SI/HI, AVH at present. Denies pain at present. Patient goal today "to get better."  Rates depression 7/10, hopelessness 7/10, and anxiety 5/10. Patient reports energy level as normal. She reports she slept fair last night. Patient does not request any PRN medication at this time.   A: Scheduled medications administered to patient per MD order. Support and encouragement provided. Routine safety checks conducted every fifteen minutes. Patient informed to notify staff with problems or concerns. Frequent verbal contact made.   R: No adverse drug reactions noted. Patient contracts for safety at this time. Patient is compliant with medications and treatment plan. Patient receptive, calm and cooperative. Patient interacts with others appropriately on unit at present. Patient remains safe at present.

## 2022-07-29 NOTE — Progress Notes (Addendum)
[facility] MD Progress Note  07/29/2022 2:54 PM Annette Chang  MRN:  [id number] Subjective:  "I just have so much. My health. I've had 6 operations on my rotator cuff. I fall all the times. I live in [facility] and I've been asking for a handicap unit for years but they won't give it to me because of the turn over. They won't fix my apartment. It's a lot".  Principal Problem: MDD (major depressive disorder), severe ([place]) Diagnosis: Principal Problem:   MDD (major depressive disorder), severe (HCC)  HPI: Annette Chang is a 52 y.o. African American female with past psychiatric history of depression, suicidal ideation, and past medical history of arthritis, scoliosis, chronic pain, and anemia, who initially presented voluntarily to [facility] with increased depression x2 weeks with recent onset of suicidal ideations; patient was transferred to [facility] 07/28/21 . Patient reports she was lying in her bed at home "overwhelmed with my life and seeing myself in a casket". She initially reported cocaine and alcohol use, daily marijuana use, and being compliant with her prescribed psychoactive medications; per chart review patient later admitted to not taking Zoloft in the past. Patient reports prior mental health hospitalization at [facility] about 20 years ago. UDS+ cocaine, THC; BAL<10.   24 hr assessment: Patient noted to be intermittently visible in milieu and attending unit groups. She is ambulating on unit using walker as walking aide. Sleeping 6.5 hours. Medication compliant. PRN Nicorette gum given x1; no other PRNs noted. PT consult ordered.   Assessment: Patient observed in milieu, using phone, interacting with peers and staff. She reports ongoing psychosocial issues related to health decline and housing isues. Patient reports currently living on disability due to musculoskeletal illnesses including x6 rotator cuff surgeries. Endorses daily THC, nicotine use; denies any other illicit substance use. UDS+  cocaine, marijuana. BAL<10. She reports increased frustration with [employer] due to what she describes as "they keep losing my file due to turnover" as a result she continues to live in [facility] where she reports poor housing conditions and maintenance that affects her mental health. Reports appetite as 'so-so'; anxiety and depression as 7/10. Ate breakfast; continues to have Ensure supplements. Endorses improvement of mood and physical health; 'I've smiled more here than I have in a whole year. It just feels like a big happy family'. She denies any SI/HI/AVH. Contracts for safety. Denies any concerns on the unit at this time.   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    Past Surgical History:  Procedure Laterality Date   CESAREAN SECTION     3 previous c sections   EYE SURGERY Bilateral    MUSCLE REPAIR Right 01/10/2017   RIGHT PECTORAL MAJOR TO SCAPULA MUSCLE TRANSFER (Right   NOVASURE ABLATION N/A 03/11/2014   Procedure: NOVASURE ABLATION;  Surgeon: Emily Filbert, MD;  Location: [facility];  Service: Gynecology;  Laterality: N/A;   PECTORALIS TENDON REPAIR Right  01/10/2017   Procedure: RIGHT PECTORAL MAJOR TO SCAPULA MUSCLE TRANSFER;  Surgeon: Meredith Pel, MD;  Location: [place];  Service: Orthopedics;  Laterality: Right;   PECTORALIS TENDON REPAIR Right 12/28/2017   Procedure: RIGHT REVISION TENDON TRANSFER OF PECTORALIS MAJOR;  Surgeon: Meredith Pel, MD;  Location: [place];  Service: Orthopedics;  Laterality: Right;   rotator cuff surgery     SHOULDER SURGERY     TUBAL LIGATION     Family History:  Family History  Problem Relation Age of Onset   Diabetes Maternal Grandmother    Diabetes Paternal Grandmother    Diabetes Paternal Grandfather    Colon cancer Neg Hx    Esophageal cancer Neg Hx    Rectal cancer Neg Hx    Stomach cancer Neg Hx    Family Psychiatric History: not noted Social History:  Social History   Substance and Sexual Activity  Alcohol Use Yes   Alcohol/week: 18.0 standard drinks of alcohol   Types: 18 Cans of beer per week   Comment: three times a week drink 6 beers in one sitting     Social History   Substance and Sexual Activity  Drug Use Yes   Types: Cocaine, Marijuana   Comment: marijuana used 3 times a weekly    Social History   Socioeconomic History   Marital status: Single    Spouse name: Not on file   Number of children: 2   Years of education: 12   Highest education level: Not on file  Occupational History   Occupation: unemployed  Tobacco Use   Smoking status: Former    Years: 5.00    Types: Cigarettes    Quit date: 11/2017    Years since quitting: 4.6    Passive exposure: Never   Smokeless tobacco: Never  Vaping Use   Vaping Use: Never used  Substance and Sexual Activity   Alcohol use: Yes    Alcohol/week: 18.0 standard drinks of alcohol    Types: 18 Cans of beer per week    Comment: three times a week drink 6 beers in one sitting   Drug use: Yes    Types: Cocaine, Marijuana    Comment: marijuana used 3 times a weekly   Sexual activity: Not Currently    Birth control/protection: Surgical  Other Topics Concern   Not on file  Social History Narrative   Patient does not drink caffeine.   Patient is right handed.       Current Social History   (Please include date ( . td) when updating information )      Who lives at home: husband of 53 years and 4 yr old daughter 08/24/2016     Transportation: car 08/24/2016   Important Relationships & Pets: neighbors 08/24/2016    Current Stressors: only income is from husband's job 08/24/2016   Work / Education:  Unable to work due to shoulder 08/24/2016   Religious / Personal Beliefs: not assessed 08/24/2016   Interests / Fun: working to get out more 08/24/2016   Other: going to [place] to establish mental health provider  08/24/2016  Social Determinants of Health   Financial Resource Strain: Not on file  Food Insecurity: No Food Insecurity (07/28/2022)   Hunger Vital Sign    Worried About Running Out of Food in the Last Year: Never true    Ran Out of Food in the Last Year: Never true  Transportation Needs: No Transportation Needs (07/28/2022)   PRAPARE - Hydrologist (Medical): No    Lack of Transportation (Non-Medical): No  Physical Activity: Not on file  Stress: Not on file  Social Connections: Not on file   Additional Social History:   Sleep: Good  Appetite:  Good  Current Medications: Current Facility-Administered Medications  Medication Dose Route Frequency Provider Last Rate Last Admin   alum & mag hydroxide-simeth (MAALOX/MYLANTA) 200-200-20 MG/5ML suspension 30 mL  30 mL Oral Q4H PRN Onuoha, Chinwendu V, NP       Apremilast TABS 30 mg  30 mg Oral BID Massengill, Ovid Curd, MD   30 mg at 07/29/22 0950   calcium-vitamin D (OSCAL WITH D) 500-5 MG-MCG per tablet 1 tablet  1 tablet Oral Daily Massengill, Ovid Curd, MD   1 tablet at 07/29/22 0950   feeding supplement (ENSURE ENLIVE / ENSURE PLUS) liquid 237 mL  237 mL Oral BID BM Massengill, Ovid Curd, MD   237 mL at 07/29/22 0951   ferrous gluconate (FERGON) tablet 324 mg  324 mg Oral Daily Massengill, Ovid Curd, MD   324 mg at 07/29/22 0950   hydrOXYzine (ATARAX) tablet 25 mg  25 mg Oral TID PRN Onuoha, Chinwendu V, NP       ketorolac (ACULAR) 0.5 %  ophthalmic solution 1 drop  1 drop Left Eye Daily PRN Massengill, Nathan, MD       magnesium hydroxide (MILK OF MAGNESIA) suspension 30 mL  30 mL Oral Daily PRN Onuoha, Chinwendu V, NP       methocarbamol (ROBAXIN) tablet 500 mg  500 mg Oral Q8H PRN Massengill, Nathan, MD       multivitamin with minerals tablet 1 tablet  1 tablet Oral Daily Massengill, Nathan, MD   1 tablet at 07/29/22 0962   nicotine polacrilex (NICORETTE) gum 2 mg  2 mg Oral PRN Massengill, Ovid Curd, MD   2 mg at 07/28/22 1811   pantoprazole (PROTONIX) EC tablet 40 mg  40 mg Oral Daily Massengill, Ovid Curd, MD   40 mg at 07/29/22 0950   sertraline (ZOLOFT) tablet 50 mg  50 mg Oral QHS Onuoha, Chinwendu V, NP   50 mg at 07/28/22 2116   traMADol (ULTRAM) tablet 50 mg  50 mg Oral BID Massengill, Ovid Curd, MD   50 mg at 07/29/22 0950   traZODone (DESYREL) tablet 50 mg  50 mg Oral QHS PRN Onuoha, Chinwendu V, NP        Lab Results:  Results for orders placed or performed during the hospital encounter of 07/27/22 (from the past 48 hour(s))  SARS Coronavirus 2 by RT PCR (hospital order, performed in [facility] hospital lab) *cepheid single result test* Anterior Nasal Swab     Status: None   Collection Time: 07/27/22  7:55 PM   Specimen: Anterior Nasal Swab  Result Value Ref Range   SARS Coronavirus 2 by RT PCR NEGATIVE NEGATIVE    Comment: (NOTE) SARS-CoV-2 target nucleic acids are NOT DETECTED.  The SARS-CoV-2 RNA is generally detectable in upper and lower respiratory specimens during the acute phase of infection. The lowest concentration of SARS-CoV-2 viral copies this assay can detect is  250 copies / mL. A negative result does not preclude SARS-CoV-2 infection and should not be used as the sole basis for treatment or other patient management decisions.  A negative result may occur with improper specimen collection / handling, submission of specimen other than nasopharyngeal swab, presence of viral mutation(s) within the areas  targeted by this assay, and inadequate number of viral copies (<250 copies / mL). A negative result must be combined with clinical observations, patient history, and epidemiological information.  Fact Sheet for Patients:   https://www.patel.info/  Fact Sheet for Healthcare Providers: https://hall.com/  This test is not yet approved or  cleared by the [place] FDA and has been authorized for detection and/or diagnosis of SARS-CoV-2 by FDA under an Emergency Use Authorization (EUA).  This EUA will remain in effect (meaning this test can be used) for the duration of the COVID-19 declaration under Section 564(b)(1) of the Act, 21 U.S.C. section 360bbb-3(b)(1), unless the authorization is terminated or revoked sooner.  Performed at [facility], [place] [address], [place], [place] [address]     Blood Alcohol level:  Lab Results  Component Value Date   [facility] <10 07/27/2022   ETH <5 19/50/9326    Metabolic Disorder Labs: Lab Results  Component Value Date   HGBA1C 5.2 06/02/2021   No results found for: "PROLACTIN" Lab Results  Component Value Date   CHOL 213 (H) 07/22/2019   TRIG 128 07/22/2019   HDL 85 07/22/2019   CHOLHDL 2.5 07/22/2019   LDLCALC 106 (H) 07/22/2019    Physical Findings: AIMS:  , ,  ,  ,    CIWA:    COWS:     Musculoskeletal: Strength & Muscle Tone: within normal limits Gait & Station: normal Patient leans: N/A  Psychiatric Specialty Exam:  Presentation  General Appearance:  Appropriate for Environment; Casual; Fairly Groomed  Eye Contact: Good  Speech: Clear and Coherent  Speech Volume: Normal  Handedness: Right   Mood and Affect  Mood: Dysphoric  Affect: Blunt; Appropriate  Thought Process  Thought Processes: Coherent  Descriptions of Associations:Intact  Orientation:Full (Time, Place and Person)  Thought Content:Logical  History of Schizophrenia/Schizoaffective  disorder:No data recorded Duration of Psychotic Symptoms:No data recorded Hallucinations:Hallucinations: None  Ideas of Reference:None  Suicidal Thoughts:Suicidal Thoughts: No SI Passive Intent and/or Plan: -- (Patient denies)  Homicidal Thoughts:Homicidal Thoughts: No   Sensorium  Memory: Immediate Good; Recent Good  Judgment: Fair  Insight: Fair  Community education officer  Concentration: Good  Attention Span: Good  Recall: Good  Fund of Knowledge: Good  Language: Good  Psychomotor Activity  Psychomotor Activity: Psychomotor Activity: Normal  Assets  Assets: Communication Skills; Desire for Improvement; Housing; Physical Health; Resilience; Social Support; Catering manager; Talents/Skills  Sleep  Sleep: Sleep: Fair Number of Hours of Sleep: 5  Physical Exam: Physical Exam Vitals and nursing note reviewed.  Constitutional:      Appearance: She is ill-appearing.  HENT:     Head: Normocephalic.     Nose: Nose normal.     Mouth/Throat:     Mouth: Mucous membranes are moist.     Pharynx: Oropharynx is clear.  Eyes:     Pupils: Pupils are equal, round, and reactive to light.  Cardiovascular:     Rate and Rhythm: Normal rate.     Pulses: Normal pulses.  Pulmonary:     Effort: Pulmonary effort is normal.  Abdominal:     General: Abdomen is flat.  Musculoskeletal:     Cervical  back: Normal range of motion.  Skin:    General: Skin is warm and dry.  Neurological:     Mental Status: She is alert and oriented to person, place, and time.     Sensory: Sensory deficit present.     Motor: Weakness present.     Coordination: Coordination abnormal.     Gait: Gait abnormal.  Psychiatric:        Attention and Perception: Attention and perception normal. She does not perceive auditory or visual hallucinations.        Mood and Affect: Mood and affect normal.        Speech: Speech normal.        Behavior: Behavior is withdrawn. Behavior is  cooperative.        Thought Content: Thought content is not paranoid or delusional. Thought content does not include homicidal or suicidal ideation. Thought content does not include homicidal or suicidal plan.        Cognition and Memory: Cognition and memory normal.        Judgment: Judgment normal.    Review of Systems  Psychiatric/Behavioral:  Positive for depression and substance abuse. Negative for hallucinations and suicidal ideas.   All other systems reviewed and are negative.  Blood pressure (!) 136/96, pulse 70, temperature 98.6 F (37 C), temperature source Oral, resp. rate 16, height '5\' 1"'$  (1.549 m), weight 42.2 kg, SpO2 100 %. Body mass index is 17.57 kg/m.   Treatment Plan Summary: Daily contact with patient to assess and evaluate symptoms and progress in treatment, Medication management, and Plan   PLAN:  Safety and Monitoring: Voluntary admission to inpatient psychiatric unit for safety, stabilization and treatment Daily contact with patient to assess and evaluate symptoms and progress in treatment Patient's case to be discussed in multi-disciplinary team meeting Observation Level : q15 minute checks Vital signs: q12 hours Precautions: suicide, but pt currently verbally contracts for safety on unit    Physician Treatment Plan for Secondary Diagnosis: Principal Problem:   MDD (major depressive disorder), severe (HCC)   Major depressive disorder recurrent severe --Continue:  -Sertraline Zoloft 50 mg p.o. daily HS   Anxiety --Continue:  -Hydroxyzine 25 mg 3 times daily as  needed/anxiety   Insomnia --Continue:  -Trazodone 50 mg p.o. nightly daily  PRN    Smoking cessation: --Continue:  -Nicotine replacement protocol   Scoliosis: -- Continue: -Apremilast 30 mg p.o. twice daily.   Family members to supply   Calcium deficiency: -- Continue:  -Calcium-vitamin D 500-5 mg-micrograms 30 tablets 1 tablet daily   Nutritional supplement: --Continue:   -Ensure Enlive/Ensure Plus to 37  meal p.o. 2 times daily between meals   Anemia: -- Continue:  -Ferrous gluconate tablet 324 mg  p.o. daily - Patient multivitamin with minerals 1  tablet p.o. daily   Muscle spasm: --Continue:  -Robaxin tablet 500 mg p.o. every 8 hours as needed   GERD: --Continue:  -Protonix EC 40 mg tablet p.o. daily.   Dry/itchy eyes --Continue:  -Ocular 0.5% ophthalmic solution 1 drop to left eye daily as needed   Bone pain:      --Continue:  -Tramadol 50 mg tablet p.o. BID  **Patient to use standard walker for ambulation   Other PRN Medications -Acetaminophen 650 mg every 6 as needed/mild pain -Maalox 30 mL oral every 4 as needed/digestion -Magnesium hydroxide 30 mL daily as needed/mild constipation   Discharge Planning: Social work and case management to assist with discharge planning and identification of hospital follow-up  needs prior to discharge Estimated LOS: 5-7 days Discharge Concerns: Need to establish a safety plan; Medication compliance and effectiveness Discharge Goals: Return home with outpatient referrals for mental health follow-up including medication management/psychotherapy.   Inda Merlin, NP 07/29/2022, 2:54 PM

## 2022-07-29 NOTE — [facility] Group Notes (Signed)
Pt did attend AA group  

## 2022-07-30 DIAGNOSIS — F322 Major depressive disorder, single episode, severe without psychotic features: Secondary | ICD-10-CM | POA: Diagnosis not present

## 2022-07-30 MED ORDER — ALENDRONATE SODIUM 10 MG PO TABS
70.0000 mg | ORAL_TABLET | Freq: Every day | ORAL | Status: DC
  Administered 2022-07-30: 70 mg via ORAL
  Filled 2022-07-30 (×3): qty 7

## 2022-07-30 NOTE — Progress Notes (Signed)
[facility] MD Progress Note  07/30/2022 8:51 AM Annette Chang  MRN:  [id number] Subjective:  "I just have so much. My health. I've had 6 operations on my rotator cuff. I fall all the times. I live in [facility] and I've been asking for a handicap unit for years but they won't give it to me because of the turn over. They won't fix my apartment. It's a lot".  Principal Problem: MDD (major depressive disorder), severe ([place]) Diagnosis: Principal Problem:   MDD (major depressive disorder), severe (HCC)  HPI: Annette Chang is a 52 y.o. African American female with past psychiatric history of depression, suicidal ideation, and past medical history of arthritis, scoliosis, chronic pain, and anemia, who initially presented voluntarily to [facility] with increased depression x2 weeks with recent onset of suicidal ideations; patient was transferred to [facility] 07/28/21 . Patient reports she was lying in her bed at home "overwhelmed with my life and seeing myself in a casket". She initially reported cocaine and alcohol use, daily marijuana use, and being compliant with her prescribed psychoactive medications; per chart review patient later admitted to not taking Zoloft in the past. Patient reports prior mental health hospitalization at [facility] about 20 years ago. UDS+ cocaine, THC; BAL<10.   24 hr assessment: Patient noted to be intermittently visible in milieu and attending unit groups. She is ambulating on unit using walker as walking aide. Slept overnight without any issues. Medication compliant. PRN Nicorette gum given x1; no other PRNs noted. PT consult ordered.   Assessment: Patient observed in bed asks provider to come back. Patient presented herself to office door to complete assessment.  She reports doing 'so so' feels weather and her completing physical therapy yesterday is contributing to some pain and stiffness but states its manageable. Reports improved appetite; anxiety and depression as 4/10; 'it's  getting better'. Ate some breakfast; continues to have Ensure supplements. Endorses improvement of mood and motivation. Feels meals depend on cook. Expressed need to take Fosamax today; provider put in order. She denies any SI/HI/AVH. Contracts for safety. Denies any concerns on the unit at this time.   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    Past Surgical History:  Procedure Laterality Date   CESAREAN SECTION     3 previous c sections   EYE SURGERY Bilateral    MUSCLE REPAIR Right 01/10/2017   RIGHT PECTORAL MAJOR TO SCAPULA MUSCLE TRANSFER (Right   NOVASURE ABLATION N/A 03/11/2014   Procedure: NOVASURE ABLATION;  Surgeon: Emily Filbert, MD;  Location: [facility];  Service: Gynecology;  Laterality: N/A;   PECTORALIS TENDON REPAIR Right 01/10/2017   Procedure: RIGHT PECTORAL MAJOR TO SCAPULA MUSCLE TRANSFER;  Surgeon: Meredith Pel, MD;  Location: [place];  Service: Orthopedics;  Laterality: Right;   PECTORALIS TENDON REPAIR Right 12/28/2017   Procedure: RIGHT REVISION TENDON TRANSFER OF PECTORALIS MAJOR;  Surgeon: Meredith Pel, MD;  Location: [place];  Service: Orthopedics;  Laterality: Right;   rotator cuff surgery     SHOULDER SURGERY     TUBAL LIGATION     Family History:  Family History  Problem Relation Age of Onset   Diabetes Maternal Grandmother    Diabetes Paternal Grandmother    Diabetes Paternal Grandfather    Colon cancer Neg Hx    Esophageal cancer Neg Hx    Rectal  cancer Neg Hx    Stomach cancer Neg Hx    Family Psychiatric History: not noted Social History:  Social History   Substance and Sexual Activity  Alcohol Use Yes   Alcohol/week: 18.0 standard drinks of alcohol   Types: 18 Cans of beer per week   Comment: three times a week drink 6 beers in one sitting     Social History   Substance and Sexual Activity  Drug Use Yes   Types: Cocaine, Marijuana   Comment: marijuana used 3 times a weekly    Social History   Socioeconomic History   Marital status: Single    Spouse name: Not on file   Number of children: 2   Years of education: 12   Highest education level: Not on file  Occupational History   Occupation: unemployed  Tobacco Use   Smoking status: Former    Years: 5.00    Types: Cigarettes    Quit date: 11/2017    Years since quitting: 4.6    Passive exposure: Never   Smokeless tobacco: Never  Vaping Use   Vaping Use: Never used  Substance and Sexual Activity   Alcohol use: Yes    Alcohol/week: 18.0 standard drinks of alcohol    Types: 18 Cans of beer per week    Comment: three times a week drink 6 beers in one sitting   Drug use: Yes    Types: Cocaine, Marijuana    Comment: marijuana used 3 times a weekly   Sexual activity: Not Currently    Birth control/protection: Surgical  Other Topics Concern   Not on file  Social History Narrative   Patient does not drink caffeine.   Patient is right handed.       Current Social History   (Please include date ( . td) when updating information )      Who lives at home: husband of 42 years and 47 yr old daughter 08/24/2016    Transportation: car 08/24/2016   Important Relationships & Pets: neighbors 08/24/2016    Current Stressors: only income is from husband's job 08/24/2016   Work / Education:  Unable to work due to shoulder 08/24/2016   Religious / Personal Beliefs: not assessed 08/24/2016   Interests / Fun: working to get out more 08/24/2016   Other: going to [place] to  establish mental health provider  08/24/2016                                                                                                      Social Determinants of Health   Financial Resource Strain: Not on file  Food Insecurity: No Food Insecurity (  07/28/2022)   Hunger Vital Sign    Worried About Running Out of Food in the Last Year: Never true    [place] in the Last Year: Never true  Transportation Needs: No Transportation Needs (07/28/2022)   PRAPARE - Hydrologist (Medical): No    Lack of Transportation (Non-Medical): No  Physical Activity: Not on file  Stress: Not on file  Social Connections: Not on file   Additional Social History:   Sleep: Good  Appetite:  Good  Current Medications: Current Facility-Administered Medications  Medication Dose Route Frequency Provider Last Rate Last Admin   alum & mag hydroxide-simeth (MAALOX/MYLANTA) 200-200-20 MG/5ML suspension 30 mL  30 mL Oral Q4H PRN Onuoha, Chinwendu V, NP       Apremilast TABS 30 mg  30 mg Oral BID Massengill, Ovid Curd, MD   30 mg at 07/30/22 2458   calcium-vitamin D (OSCAL WITH D) 500-5 MG-MCG per tablet 1 tablet  1 tablet Oral Daily Massengill, Ovid Curd, MD   1 tablet at 07/30/22 0998   feeding supplement (ENSURE ENLIVE / ENSURE PLUS) liquid 237 mL  237 mL Oral BID BM Massengill, Ovid Curd, MD   237 mL at 07/29/22 1610   ferrous gluconate (FERGON) tablet 324 mg  324 mg Oral Daily Massengill, Ovid Curd, MD   324 mg at 07/30/22 3382   hydrOXYzine (ATARAX) tablet 25 mg  25 mg Oral TID PRN Onuoha, Chinwendu V, NP       ketorolac (ACULAR) 0.5 % ophthalmic solution 1 drop  1 drop Left Eye Daily PRN Massengill, Nathan, MD       magnesium hydroxide (MILK OF MAGNESIA) suspension 30 mL  30 mL Oral Daily PRN Onuoha, Chinwendu V, NP       methocarbamol (ROBAXIN) tablet 500 mg  500 mg Oral Q8H PRN Massengill, Nathan, MD       multivitamin with minerals tablet 1 tablet  1 tablet Oral Daily Massengill,  Nathan, MD   1 tablet at 07/30/22 5053   nicotine polacrilex (NICORETTE) gum 2 mg  2 mg Oral PRN Massengill, Ovid Curd, MD   2 mg at 07/28/22 1811   pantoprazole (PROTONIX) EC tablet 40 mg  40 mg Oral Daily Massengill, Ovid Curd, MD   40 mg at 07/30/22 0823   sertraline (ZOLOFT) tablet 50 mg  50 mg Oral QHS Onuoha, Chinwendu V, NP   50 mg at 07/29/22 2116   traMADol (ULTRAM) tablet 50 mg  50 mg Oral BID Massengill, Ovid Curd, MD   50 mg at 07/30/22 0824   traZODone (DESYREL) tablet 50 mg  50 mg Oral QHS PRN Onuoha, Chinwendu V, NP        Lab Results:  No results found for this or any previous visit (from the past 19 hour(s)).   Blood Alcohol level:  Lab Results  Component Value Date   ETH <10 07/27/2022   ETH <5 97/67/3419    Metabolic Disorder Labs: Lab Results  Component Value Date   HGBA1C 5.2 06/02/2021   No results found for: "PROLACTIN" Lab Results  Component Value Date   CHOL 213 (H) 07/22/2019   TRIG 128 07/22/2019   HDL 85 07/22/2019   CHOLHDL 2.5 07/22/2019   LDLCALC 106 (H) 07/22/2019    Physical Findings: AIMS:  , ,  ,  ,    CIWA:    COWS:     Musculoskeletal: Strength & Muscle Tone: within normal limits Gait & Station: normal Patient leans: N/A  Psychiatric  Specialty Exam:  Presentation  General Appearance:  Appropriate for Environment; Casual; Fairly Groomed  Eye Contact: Good  Speech: Clear and Coherent  Speech Volume: Normal  Handedness: Right   Mood and Affect  Mood: Dysphoric  Affect: Blunt; Appropriate  Thought Process  Thought Processes: Coherent  Descriptions of Associations:Intact  Orientation:Full (Time, Place and Person)  Thought Content:Logical  History of Schizophrenia/Schizoaffective disorder:No data recorded Duration of Psychotic Symptoms:No data recorded Hallucinations:Hallucinations: None  Ideas of Reference:None  Suicidal Thoughts:Suicidal Thoughts: No  Homicidal Thoughts:Homicidal Thoughts:  No   Sensorium  Memory: Immediate Good; Recent Good  Judgment: Fair  Insight: Fair  Community education officer  Concentration: Good  Attention Span: Good  Recall: Good  Fund of Knowledge: Good  Language: Good  Psychomotor Activity  Psychomotor Activity: Psychomotor Activity: Normal  Assets  Assets: Communication Skills; Desire for Improvement; Housing; Physical Health; Resilience; Social Support; Catering manager; Talents/Skills  Sleep  Sleep: Sleep: Fair  Physical Exam: Physical Exam Vitals and nursing note reviewed.  Constitutional:      Appearance: She is ill-appearing.  HENT:     Head: Normocephalic.     Nose: Nose normal.     Mouth/Throat:     Mouth: Mucous membranes are moist.     Pharynx: Oropharynx is clear.  Eyes:     Pupils: Pupils are equal, round, and reactive to light.  Cardiovascular:     Rate and Rhythm: Normal rate.     Pulses: Normal pulses.  Pulmonary:     Effort: Pulmonary effort is normal.  Abdominal:     General: Abdomen is flat.  Musculoskeletal:     Cervical back: Normal range of motion.  Skin:    General: Skin is warm and dry.  Neurological:     Mental Status: She is alert and oriented to person, place, and time.     Sensory: Sensory deficit present.     Motor: Weakness present.     Coordination: Coordination abnormal.     Gait: Gait abnormal.  Psychiatric:        Attention and Perception: Attention and perception normal. She does not perceive auditory or visual hallucinations.        Mood and Affect: Mood and affect normal.        Speech: Speech normal.        Behavior: Behavior is withdrawn. Behavior is cooperative.        Thought Content: Thought content is not paranoid or delusional. Thought content does not include homicidal or suicidal ideation. Thought content does not include homicidal or suicidal plan.        Cognition and Memory: Cognition and memory normal.        Judgment: Judgment normal.     Review of Systems  Psychiatric/Behavioral:  Positive for depression and substance abuse. Negative for hallucinations and suicidal ideas.   All other systems reviewed and are negative.  Blood pressure (!) 130/93, pulse (!) 58, temperature 98.6 F (37 C), temperature source Oral, resp. rate 16, height '5\' 1"'$  (1.549 m), weight 42.2 kg, SpO2 100 %. Body mass index is 17.57 kg/m.   Treatment Plan Summary: Daily contact with patient to assess and evaluate symptoms and progress in treatment, Medication management, and Plan   PLAN:  Safety and Monitoring: Voluntary admission to inpatient psychiatric unit for safety, stabilization and treatment Daily contact with patient to assess and evaluate symptoms and progress in treatment Patient's case to be discussed in multi-disciplinary team meeting Observation Level : q15 minute checks Vital signs:  q12 hours Precautions: suicide, but pt currently verbally contracts for safety on unit    Physician Treatment Plan for Secondary Diagnosis: Principal Problem:   MDD (major depressive disorder), severe ([place])   Major depressive disorder recurrent severe --Continue:  -Sertraline Zoloft 50 mg p.o. daily HS   Anxiety --Continue:  -Hydroxyzine 25 mg 3 times daily as  needed/anxiety   Insomnia --Continue:  -Trazodone 50 mg p.o. nightly daily  PRN    Smoking cessation: --Continue:  -Nicotine replacement protocol   Scoliosis: -- Continue: -Apremilast 30 mg p.o. twice daily.   Family members to supply   Calcium deficiency: -- Continue:  -Calcium-vitamin D 500-5 mg-micrograms 30 tablets 1 tablet daily --Start home medication:   -Alendronate (Fosamax) 70 mg PO every 7 days.    Nutritional supplement: --Continue:  -Ensure Enlive/Ensure Plus to 37  meal p.o. 2 times daily between meals   Anemia: -- Continue:  -Ferrous gluconate tablet 324 mg  p.o. daily - Patient multivitamin with minerals 1  tablet p.o. daily   Muscle  spasm: --Continue:  -Robaxin tablet 500 mg p.o. every 8 hours as needed   GERD: --Continue:  -Protonix EC 40 mg tablet p.o. daily.   Dry/itchy eyes --Continue:  -Ocular 0.5% ophthalmic solution 1 drop to left eye daily as needed   Bone pain:      --Continue:  -Tramadol 50 mg tablet p.o. BID  **Patient to use standard walker for ambulation   Other PRN Medications -Acetaminophen 650 mg every 6 as needed/mild pain -Maalox 30 mL oral every 4 as needed/digestion -Magnesium hydroxide 30 mL daily as needed/mild constipation   Discharge Planning: Social work and case management to assist with discharge planning and identification of hospital follow-up needs prior to discharge Estimated LOS: 5-7 days Discharge Concerns: Need to establish a safety plan; Medication compliance and effectiveness Discharge Goals: Return home with outpatient referrals for mental health follow-up including medication management/psychotherapy.   Annette Merlin, NP 07/30/2022, 8:51 AMPatient ID: Birdie Sons, female   DOB: February 17, 1971, 52 y.o.   MRN: [id number]

## 2022-07-30 NOTE — B[facility] Group Notes (Signed)
Goals Group 07/30/22   Group Focus: affirmation, clarity of thought, and goals/reality orientation Treatment Modality:  Psychoeducation Interventions utilized were assignment, group exercise, and support Purpose: To be able to understand and verbalize the reason for their admission to the hospital. To understand that the medication helps with their chemical imbalance but they also need to work on their choices in life. To be challenged to develop a list of 30 positives about themselves. Also introduce the concept that "feelings" are not reality.  Participation Level:  Active  Participation Quality:  Appropriate  Affect:  Appropriate  Cognitive:  Appropriate  Insight:  Improving  Engagement in Group:  Engaged  Additional Comments: Rates energy at a 7/10. Participated in the group.  Annette Chang

## 2022-07-30 NOTE — BHH Group Notes (Signed)
.  Psychoeducational Group Note    Date:  07/30/2022 Time:1300-1400    Purpose of Group: . The group focus' on teaching patients on how to identify their needs and their Life Skills:  A group where two lists are made. What people need and what are things that we do that are unhealthy. The lists are developed by the patients and it is explained that we often do the actions that are not healthy to get our list of needs met.  Goal:: to develop the coping skills needed to get their needs met  Participation Level:  did not attend  Paulino Rily

## 2022-07-30 NOTE — B[facility] Group Notes (Signed)
[name] Notes:  (Nursing/MHT/Case Management/Adjunct)  Date:  07/30/2022  Time:  9:35 AM    Group Topic/Focus:  Goals Group:   The focus of this group is to help patients establish daily goals to achieve during treatment and discuss how the patient can incorporate goal setting into their daily lives to aide in recovery.  Participation Level:  Active  Participation Quality:  Appropriate  Affect:  Appropriate  Cognitive:  Appropriate  Insight:  Appropriate  Engagement in Group:  Engaged  Modes of Intervention:  Discussion  Summary of Progress/Problems: Patient gaol is to get better daily.  Annette Chang 07/30/2022, 9:35 AM

## 2022-07-30 NOTE — Group Note (Signed)
LCSW Group Therapy Note  05/18/2019   10:00-11:00am   Type of Therapy and Topic:  Group Therapy: Anger Analysis Using CBT  Participation Level:  None   Description of Group:   In this group, patients learned how to recognize the physical, cognitive, emotional, and behavioral responses they have to anger-provoking situations.  They identified a recent time they became angry and how they reacted.  They identified the thoughts they had at the time they last were angry, and how this influenced their subsequent actions.  The group then explored how Cognitive Behavioral analysis can help change outcomes.  Therapeutic Goals: Patients will remember his/her last incident of anger and the surrounding circumstances Patients will identify how they felt emotionally and physically, what their thoughts were at the time, and what actions they took Patients will explore possible changes in their feelings and actions if their thoughts had been analyzed and actually found to be inaccurate or unhelpful.  Summary of Patient Progress:  The patient did not arrive to group until the last 5 minutes, sat quietly listening.  Therapeutic Modalities:   Cognitive Behavioral Therapy  Maretta Los, MSW, [place]

## 2022-07-30 NOTE — Progress Notes (Signed)
   07/30/22 0830  Psych Admission Type (Psych Patients Only)  Admission Status Voluntary  Psychosocial Assessment  Patient Complaints None  Eye Contact Brief  Facial Expression Animated  Affect Appropriate to circumstance  Speech Logical/coherent  Interaction Assertive  Motor Activity Slow  Appearance/Hygiene Unremarkable  Behavior Characteristics Cooperative  Mood Pleasant  Thought Process  Coherency WDL  Content WDL  Delusions None reported or observed  Perception WDL  Hallucination None reported or observed  Judgment Poor  Confusion None  Danger to Self  Current suicidal ideation? Denies  Danger to Others  Danger to Others None reported or observed

## 2022-07-30 NOTE — Progress Notes (Signed)
   07/29/22 2055  Psych Admission Type (Psych Patients Only)  Admission Status Voluntary  Psychosocial Assessment  Patient Complaints None  Eye Contact Fair  Facial Expression Animated  Affect Appropriate to circumstance  Speech Logical/coherent  Interaction Assertive  Motor Activity Slow  Appearance/Hygiene Unremarkable  Behavior Characteristics Cooperative;Appropriate to situation  Mood Pleasant  Thought Process  Coherency WDL  Content WDL  Delusions None reported or observed  Perception WDL  Hallucination None reported or observed  Judgment Limited  Confusion None  Danger to Self  Current suicidal ideation? Denies  Description of Suicide Plan No plan  Self-Injurious Behavior No self-injurious ideation or behavior indicators observed or expressed   Agreement Not to Harm Self Yes  Description of Agreement Verbally contracts for safety.  Danger to Others  Danger to Others None reported or observed   Patient alert and oriented. Presenting appropriate to circumstance with a pleasant mood. Patient denies SI, HI, AVH, and pain. Patient denies anxiety and depression at this time. Patient stated that she had a good day today and enjoyed PT. Scheduled medication administered to patient, per provider order. Support and encouragement provided.  Routine safety checks conducted every 15 minutes. Patient verbally contracts for safety and remains safe on unit.

## 2022-07-30 NOTE — Progress Notes (Signed)
   07/30/22 0559  15 Minute Checks  Location Bedroom  Visual Appearance Calm  Behavior Sleeping  Sleep (Behavioral Health Patients Only)  Calculate sleep? (Click Yes once per 24 hr at 0600 safety check) Yes  Documented sleep last 24 hours 8.25

## 2022-07-30 NOTE — Progress Notes (Signed)
   07/30/22 2213  Psych Admission Type (Psych Patients Only)  Admission Status Voluntary  Psychosocial Assessment  Patient Complaints None  Eye Contact Brief  Facial Expression Animated  Affect Appropriate to circumstance  Speech Logical/coherent  Interaction Assertive  Motor Activity Slow (Pt utilizes walker)  Appearance/Hygiene Unremarkable  Behavior Characteristics Appropriate to situation  Mood Pleasant  Thought Process  Coherency WDL  Content WDL  Delusions None reported or observed  Perception WDL  Hallucination None reported or observed  Judgment Poor  Confusion None  Danger to Self  Current suicidal ideation? Denies  Self-Injurious Behavior No self-injurious ideation or behavior indicators observed or expressed   Agreement Not to Harm Self Yes  Description of Agreement verbal  Danger to Others  Danger to Others None reported or observed

## 2022-07-31 DIAGNOSIS — F322 Major depressive disorder, single episode, severe without psychotic features: Secondary | ICD-10-CM | POA: Diagnosis not present

## 2022-07-31 LAB — RESP PANEL BY RT-PCR (RSV, FLU A&B, COVID)  RVPGX2
Influenza A by PCR: NEGATIVE
Influenza B by PCR: NEGATIVE
Resp Syncytial Virus by PCR: NEGATIVE
SARS Coronavirus 2 by RT PCR: NEGATIVE

## 2022-07-31 MED ORDER — ACETAMINOPHEN 325 MG PO TABS
650.0000 mg | ORAL_TABLET | Freq: Once | ORAL | Status: AC
  Administered 2022-07-31: 650 mg via ORAL
  Filled 2022-07-31 (×2): qty 2

## 2022-07-31 MED ORDER — ALENDRONATE SODIUM 70 MG PO TABS: 70.0000 mg | ORAL_TABLET | ORAL | Status: DC

## 2022-07-31 NOTE — Progress Notes (Signed)
[facility] MD Progress Note  07/31/2022 11:32 AM CLARISSIA MCKEEN  MRN:  [id number]  Principal Problem: MDD (major depressive disorder), severe ([place]) Diagnosis: Principal Problem:   MDD (major depressive disorder), severe ([place])  HPI: ZAKYA HALABI is a 52 y.o. African American female with past psychiatric history of depression, suicidal ideation, and past medical history of arthritis, scoliosis, chronic pain, and anemia, who initially presented voluntarily to [facility] with increased depression x2 weeks with recent onset of suicidal ideations; patient was transferred to [facility] 07/28/21 . Patient reports she was lying in her bed at home "overwhelmed with my life and seeing myself in a casket". She initially reported cocaine and alcohol use, daily marijuana use, and being compliant with her prescribed psychoactive medications; per chart review patient later admitted to not taking Zoloft in the past. Patient reports prior mental health hospitalization at [facility] about 20 years ago. UDS+ cocaine, THC; BAL<10.   24 hr assessment: Patient noted to be intermittently visible in milieu and attending unit groups. She is ambulating on unit using walker as walking aide.  Medication compliant. PRN Robaxin 500 mg  given x1 for muscle spasms; no other PRNs noted. PT consult ordered.   Assessment: Patient observed in bed; reports poor sleep due to frequent rounding by staff. Casually dressed. Dysphoric mood; congruent affect. Consistent eye contact throughout assessment. Reports stable appetite and mood. Rates anxiety and depression as 4/10; 'same as yesterday, it's getting better. I'm just tired'. Ate breakfast; continues to have Ensure supplements. Endorses improvement of motivation. She denies any SI/HI/AVH. Contracts for safety.   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    Past Surgical History:  Procedure Laterality Date   CESAREAN SECTION     3 previous c sections   EYE SURGERY Bilateral    MUSCLE REPAIR Right 01/10/2017   RIGHT PECTORAL MAJOR TO SCAPULA MUSCLE TRANSFER (Right   NOVASURE ABLATION N/A 03/11/2014   Procedure: NOVASURE ABLATION;  Surgeon: Emily Filbert, MD;  Location: [facility];  Service: Gynecology;  Laterality: N/A;   PECTORALIS TENDON REPAIR Right 01/10/2017   Procedure: RIGHT PECTORAL MAJOR TO SCAPULA MUSCLE TRANSFER;  Surgeon: Meredith Pel, MD;  Location: [place];  Service: Orthopedics;  Laterality: Right;   PECTORALIS TENDON REPAIR Right 12/28/2017   Procedure: RIGHT REVISION TENDON TRANSFER OF PECTORALIS MAJOR;  Surgeon: Meredith Pel, MD;  Location: [place];  Service: Orthopedics;  Laterality: Right;   rotator cuff surgery     SHOULDER SURGERY     TUBAL LIGATION     Family History:  Family History  Problem Relation Age of Onset   Diabetes Maternal Grandmother    Diabetes Paternal Grandmother    Diabetes Paternal Grandfather    Colon cancer Neg Hx    Esophageal cancer Neg Hx    Rectal cancer Neg Hx    Stomach cancer Neg Hx    Family Psychiatric History: not noted Social History:  Social History   Substance and Sexual Activity  Alcohol Use Yes   Alcohol/week: 18.0 standard drinks of alcohol   Types: 18 Cans of beer per week   Comment: three times a week drink 6 beers in one sitting     Social History   Substance and Sexual Activity  Drug Use Yes   Types: Cocaine, Marijuana   Comment: marijuana used 3 times a weekly    Social History   Socioeconomic  History   Marital status: Single    Spouse name: Not on file   Number of children: 2   Years of education: 12   Highest education level: Not on file  Occupational History   Occupation: unemployed  Tobacco Use   Smoking status: Former    Years: 5.00    Types: Cigarettes    Quit date: 11/2017    Years since quitting: 4.6    Passive exposure: Never   Smokeless tobacco: Never  Vaping Use   Vaping Use: Never used  Substance and Sexual Activity   Alcohol use: Yes    Alcohol/week: 18.0 standard drinks of alcohol    Types: 18 Cans of beer per week    Comment: three times a week drink 6 beers in one sitting   Drug use: Yes    Types: Cocaine, Marijuana    Comment: marijuana used 3 times a weekly   Sexual activity: Not Currently    Birth control/protection: Surgical  Other Topics Concern   Not on file  Social History Narrative   Patient does not drink caffeine.   Patient is right handed.       Current Social History   (Please include date ( . td) when updating information )      Who lives at home: husband of 1 years and 55 yr old daughter 08/24/2016    Transportation: car 08/24/2016   Important Relationships & Pets: neighbors 08/24/2016    Current Stressors: only income is from husband's job 08/24/2016   Work / Education:  Unable to work due to shoulder 08/24/2016   Religious / Personal Beliefs: not assessed 08/24/2016   Interests / Fun: working to get out more 08/24/2016   Other: going to [place] to establish mental health provider  08/24/2016                                                                                                      Social Determinants of Health   Financial Resource Strain: Not on file  Food Insecurity: No Food Insecurity (07/28/2022)   Hunger Vital Sign    Worried About Running Out of Food in the Last Year: Never true    [place] in the Last Year: Never true  Transportation Needs: No Transportation Needs (07/28/2022)   PRAPARE - Armed forces logistics/support/administrative officer (Medical): No    Lack of Transportation (Non-Medical): No  Physical Activity: Not on file  Stress: Not on file  Social Connections: Not on file   Additional Social History:   Sleep: Good  Appetite:  Good  Current Medications: Current Facility-Administered Medications  Medication Dose Route Frequency Provider Last Rate Last Admin   [START ON 08/06/2022] alendronate (FOSAMAX) tablet 70 mg  70 mg Oral Weekly Green, Terri L, RPH       alum & mag hydroxide-simeth (MAALOX/MYLANTA) 200-200-20 MG/5ML suspension 30 mL  30 mL Oral Q4H PRN Onuoha, Chinwendu V, NP       Apremilast TABS 30 mg  30 mg Oral BID Massengill, Ovid Curd, MD   30 mg at 07/31/22 1448   calcium-vitamin D (OSCAL WITH D) 500-5 MG-MCG per tablet 1 tablet  1 tablet Oral Daily Massengill, Ovid Curd, MD   1 tablet at 07/31/22 1856   feeding supplement (ENSURE ENLIVE / ENSURE PLUS) liquid 237 mL  237 mL Oral BID BM Massengill, Ovid Curd, MD   237 mL at 07/30/22 1444   ferrous gluconate (FERGON) tablet 324 mg  324 mg Oral Daily Massengill, Ovid Curd, MD   324 mg at 07/31/22 3149   hydrOXYzine (ATARAX) tablet 25 mg  25 mg Oral TID PRN Onuoha, Chinwendu V, NP       ketorolac (ACULAR) 0.5 % ophthalmic solution 1 drop  1 drop Left Eye Daily PRN Massengill, Nathan, MD       magnesium hydroxide (MILK OF MAGNESIA) suspension 30 mL  30 mL Oral Daily PRN Onuoha, Chinwendu V, NP       methocarbamol (ROBAXIN) tablet 500 mg  500 mg Oral Q8H PRN Massengill, Ovid Curd, MD   500 mg at 07/30/22 2108   multivitamin with minerals tablet 1 tablet  1 tablet Oral Daily Massengill, Ovid Curd, MD   1 tablet at 07/31/22 7026   nicotine polacrilex (NICORETTE) gum 2 mg  2 mg Oral PRN Massengill, Ovid Curd, MD   2 mg at 07/28/22 1811   pantoprazole (PROTONIX) EC tablet 40 mg  40 mg Oral Daily Massengill, Ovid Curd, MD   40 mg at 07/31/22 3785   sertraline (ZOLOFT) tablet 50 mg  50 mg Oral QHS Onuoha, Chinwendu V, NP   50 mg at 07/30/22 2108   traMADol (ULTRAM)  tablet 50 mg  50 mg Oral BID Massengill, Ovid Curd, MD   50 mg at 07/31/22 8850   traZODone (DESYREL) tablet 50 mg  50 mg Oral QHS PRN Onuoha, Chinwendu V, NP        Lab Results:  No results found for this or any previous visit (from the past 42 hour(s)).   Blood Alcohol level:  Lab Results  Component Value Date   ETH <10 07/27/2022   ETH <5 27/74/1287    Metabolic Disorder Labs: Lab Results  Component Value Date   HGBA1C 5.2 06/02/2021   No results found for: "PROLACTIN" Lab Results  Component Value Date   CHOL 213 (H) 07/22/2019   TRIG 128 07/22/2019   HDL 85 07/22/2019   CHOLHDL 2.5 07/22/2019   LDLCALC 106 (H) 07/22/2019   Physical Findings: AIMS:  , ,  ,  ,    CIWA:    COWS:     Musculoskeletal: Strength & Muscle Tone: within normal limits Gait & Station: normal Patient leans: N/A  Psychiatric Specialty Exam:  Presentation  General Appearance:  Casual  Eye Contact: Fair  Speech: Clear and Coherent  Speech Volume: Normal  Handedness: Right  Mood and Affect  Mood: Dysphoric  Affect: Appropriate; Congruent  Thought Process  Thought Processes: Coherent  Descriptions of Associations:Intact  Orientation:Full (Time, Place and Person)  Thought Content:Logical  History of Schizophrenia/Schizoaffective disorder:No data recorded Duration of Psychotic Symptoms:No data  recorded Hallucinations:Hallucinations: None  Ideas of Reference:None  Suicidal Thoughts:Suicidal Thoughts: No  Homicidal Thoughts:Homicidal Thoughts: No  Sensorium  Memory: Immediate Good; Recent Good; Remote Good  Judgment: Good  Insight: Good  Executive Functions  Concentration: Good  Attention Span: Good  Recall: Good  Fund of Knowledge: Good  Language: Good  Psychomotor Activity  Psychomotor Activity: Psychomotor Activity: Normal  Assets  Assets: Communication Skills; Desire for Improvement; Housing; Physical Health; Social Support;  Resilience  Sleep  Sleep: Sleep: Good  Physical Exam: Physical Exam Vitals and nursing note reviewed.  Constitutional:      Appearance: She is ill-appearing.  HENT:     Head: Normocephalic.     Nose: Nose normal.     Mouth/Throat:     Mouth: Mucous membranes are moist.     Pharynx: Oropharynx is clear.  Eyes:     Pupils: Pupils are equal, round, and reactive to light.  Cardiovascular:     Rate and Rhythm: Normal rate.     Pulses: Normal pulses.  Pulmonary:     Effort: Pulmonary effort is normal.  Abdominal:     General: Abdomen is flat.  Musculoskeletal:     Cervical back: Normal range of motion.  Skin:    General: Skin is warm and dry.  Neurological:     Mental Status: She is alert and oriented to person, place, and time.     Sensory: Sensory deficit present.     Motor: Weakness present.     Coordination: Coordination abnormal.     Gait: Gait abnormal.  Psychiatric:        Attention and Perception: Attention and perception normal. She does not perceive auditory or visual hallucinations.        Mood and Affect: Mood and affect normal.        Speech: Speech normal.        Behavior: Behavior is withdrawn. Behavior is cooperative.        Thought Content: Thought content is not paranoid or delusional. Thought content does not include homicidal or suicidal ideation. Thought content does not include homicidal or suicidal plan.        Cognition and Memory: Cognition and memory normal.        Judgment: Judgment normal.    Review of Systems  Psychiatric/Behavioral:  Positive for depression and substance abuse. Negative for hallucinations and suicidal ideas.   All other systems reviewed and are negative.  Blood pressure 109/87, pulse 67, temperature 98.5 F (36.9 C), temperature source Oral, resp. rate 18, height '5\' 1"'$  (1.549 m), weight 42.2 kg, SpO2 100 %. Body mass index is 17.57 kg/m.  Treatment Plan Summary: Daily contact with patient to assess and evaluate symptoms  and progress in treatment, Medication management, and Plan   PLAN:  Safety and Monitoring: Voluntary admission to inpatient psychiatric unit for safety, stabilization and treatment Daily contact with patient to assess and evaluate symptoms and progress in treatment Patient's case to be discussed in multi-disciplinary team meeting Observation Level : q15 minute checks Vital signs: q12 hours Precautions: suicide, but pt currently verbally contracts for safety on unit    Physician Treatment Plan for Secondary Diagnosis: Principal Problem:   MDD (major depressive disorder), severe ([facility])   Major depressive disorder recurrent severe --Continue:  -Sertraline Zoloft 50 mg p.o. daily HS   Anxiety --Continue:  -Hydroxyzine 25 mg 3 times daily as  needed/anxiety   Insomnia --Continue:  -Trazodone 50 mg p.o. nightly daily  PRN    Smoking  cessation: --Continue:  -Nicotine replacement protocol   Scoliosis: -- Continue: -Apremilast 30 mg p.o. twice daily.   Family members to supply   Calcium deficiency: -- Continue:  -Calcium-vitamin D 500-5 mg-micrograms 30 tablets 1 tablet daily --Start home medication:   -Alendronate (Fosamax) 70 mg PO every 7 days.    Nutritional supplement: --Continue:  -Ensure Enlive/Ensure Plus to 37  meal p.o. 2 times daily between meals   Anemia: -- Continue:  -Ferrous gluconate tablet 324 mg  p.o. daily - Patient multivitamin with minerals 1  tablet p.o. daily   Muscle spasm: --Continue:  -Robaxin tablet 500 mg p.o. every 8 hours as needed   GERD: --Continue:  -Protonix EC 40 mg tablet p.o. daily.   Dry/itchy eyes --Continue:  -Ocular 0.5% ophthalmic solution 1 drop to left eye daily as needed   Bone pain:      --Continue:  -Tramadol 50 mg tablet p.o. BID  **Patient to use standard walker for ambulation   Other PRN Medications -Acetaminophen 650 mg every 6 as needed/mild pain -Maalox 30 mL oral every 4 as  needed/digestion -Magnesium hydroxide 30 mL daily as needed/mild constipation   Discharge Planning: Social work and case management to assist with discharge planning and identification of hospital follow-up needs prior to discharge Estimated LOS: 5-7 days Discharge Concerns: Need to establish a safety plan; Medication compliance and effectiveness Discharge Goals: Return home with outpatient referrals for mental health follow-up including medication management/psychotherapy.  Inda Merlin, NP 07/31/2022, 11:32 AMPatient ID: Birdie Sons, female   DOB: Jun 13, 1971, 13 y.o.   MRN: [id number]

## 2022-07-31 NOTE — Progress Notes (Signed)
D. Pt has been calm and cooperative, appropriate on the unit- reported sleeping poorly last night due to frequent interruptions from staff doing checks. . Pt currently denies SI/HI and AVH and does not appear to be responding to internal stimuli. Pt observed sitting in the dayroom watching a movie with peers. A. Labs and vitals monitored. Pt given and educated on medications. Pt supported emotionally and encouraged to express concerns and ask questions.   R. Pt remains safe with 15 minute checks. Will continue POC.

## 2022-07-31 NOTE — [facility] Group Notes (Signed)
Adult Psychoeducational Group Note Date:  07/31/2022 Time:  0900-1000 Group Topic/Focus: PROGRESSIVE RELAXATION. A group where deep breathing is taught and tensing and relaxation muscle groups is used. Imagery is used as well.  Pts are asked to imagine 3 pillars that hold them up when they are not able to hold themselves up and to share that with the group.   Participation Level: did not attend   : Annette Chang

## 2022-08-01 DIAGNOSIS — F322 Major depressive disorder, single episode, severe without psychotic features: Secondary | ICD-10-CM | POA: Diagnosis not present

## 2022-08-01 MED ORDER — TRAZODONE HCL 50 MG PO TABS
50.0000 mg | ORAL_TABLET | Freq: Every evening | ORAL | Status: DC | PRN
  Administered 2022-08-01: 50 mg via ORAL
  Filled 2022-08-01: qty 3
  Filled 2022-08-01: qty 1

## 2022-08-01 MED ORDER — TRAZODONE HCL 100 MG PO TABS: 100.0000 mg | ORAL_TABLET | Freq: Every evening | ORAL | Status: DC | PRN

## 2022-08-01 NOTE — Progress Notes (Signed)
Physical Therapy Treatment Patient Details Name: Annette Chang MRN: [id number] DOB: 22-Oct-1970 Today's Date: 08/01/2022   History of Present Illness Patient with a history of arthritis, chronic pain, anemia, multiple R shoulder surgeries, anxiety, depression, presenting with depression and thoughts of wanting to hurt herself. transfer to [facility] 07/28/22.    PT Comments    Patient  reporting that she has been dizzy today and not as mobile, does not feel like performing exercises.   Provided written copy and  verbally and demonstrated each  exercise to patient.   PT ill attempt to revisit on 08/03/22 to follow up.  Patient will benefit from a RW at DC.  Recommendations for follow up therapy are one component of a multi-disciplinary discharge planning process, led by the attending physician.  Recommendations may be updated based on patient status, additional functional criteria and insurance authorization.  Follow Up Recommendations  No PT follow up     Assistance Recommended at Discharge Set up Supervision/Assistance  Patient can return home with the following Help with stairs or ramp for entrance;Assistance with cooking/housework   Equipment Recommendations  Rolling walker (2 wheels)    Recommendations for Other Services       Precautions / Restrictions Precautions Precautions: Fall Precaution Comments: reports multiple falls Restrictions Weight Bearing Restrictions: No     Mobility  Bed Mobility Overal bed mobility: Independent                  Transfers Overall transfer level: Modified independent                      Ambulation/Gait               General Gait Details: NT due to reports of dizziness   Stairs             Wheelchair Mobility    Modified Rankin (Stroke Patients Only)       Balance Overall balance assessment: Modified Independent             Standing balance comment: NT                             Cognition Arousal/Alertness: Awake/alert Behavior During Therapy: WFL for tasks assessed/performed Overall Cognitive Status: Within Functional Limits for tasks assessed                                 General Comments: expresses feeling dizzy and not able to mobilize as well today        Exercises Other Exercises Other Exercises: provided HEP written from medbridge and left in  soft chart and with patient.    General Comments        Pertinent Vitals/Pain Pain Assessment Pain Score: 7  Pain Location: back and legs Pain Descriptors / Indicators: Aching, Grimacing, Guarding Pain Intervention(s): Monitored during session    Home Living                          Prior Function            PT Goals (current goals can now be found in the care plan section) Progress towards PT goals: Progressing toward goals    Frequency    Min 1X/week      PT Plan Current plan remains appropriate    Co-evaluation  AM-PAC PT "6 Clicks" Mobility   Outcome Measure  Help needed turning from your back to your side while in a flat bed without using bedrails?: None Help needed moving from lying on your back to sitting on the side of a flat bed without using bedrails?: None Help needed moving to and from a bed to a chair (including a wheelchair)?: None Help needed standing up from a chair using your arms (e.g., wheelchair or bedside chair)?: None Help needed to walk in hospital room?: None Help needed climbing 3-5 steps with a railing? : A Lot 6 Click Score: 22    End of Session   Activity Tolerance: Treatment limited secondary to medical complications (Comment) (dizziness) Patient left: in bed Nurse Communication:  (exercise program left with patient) PT Visit Diagnosis: Unsteadiness on feet (R26.81);Repeated falls (R29.6);Pain     Time: 2671-2458 PT Time Calculation (min) (ACUTE ONLY): 20 min  Charges:  $Therapeutic Exercise: 8-22  mins                     [place] Office [phone] Weekend [phone]    Claretha Cooper 08/01/2022, 4:20 PM

## 2022-08-01 NOTE — Plan of Care (Signed)
Nurse discussed anxiety, depression and coping skills, etc.

## 2022-08-01 NOTE — [facility] Group Notes (Signed)
Pt did not attend AA group.

## 2022-08-01 NOTE — Progress Notes (Signed)
[facility] MD Progress Note  08/01/2022 1:08 PM Annette Chang  MRN:  [id number]  Principal Problem: MDD (major depressive disorder), severe ([place]) Diagnosis: Principal Problem:   MDD (major depressive disorder), severe ([place])  HPI: Annette Chang is a 52 y.o. African American female with past psychiatric history of depression, suicidal ideation, and past medical history of arthritis, scoliosis, chronic pain, and anemia, who initially presented voluntarily to [facility] with increased depression x2 weeks with recent onset of suicidal ideations; patient was transferred to [facility] 07/28/21 . Patient reports she was lying in her bed at home "overwhelmed with my life and seeing myself in a casket". She initially reported cocaine and alcohol use, daily marijuana use, and being compliant with her prescribed psychoactive medications; per chart review patient later admitted to not taking Zoloft in the past. Patient reports prior mental health hospitalization at [facility] about 20 years ago. UDS+ cocaine, THC; BAL<10.   24 hr assessment: Vital signs reviewed within normal values except for pulse of 56.  Patient noted to be intermittently visible in milieu and attending unit groups. She is ambulating on unit using walker as walking aide.  Medication compliant. No PRN requested in the past 24 hours. PT consult ordered.   Assessment: Patient observed in bed; reports poor sleep of 1 hour last night due to frequent rounding by staff.  Trazodone 50 mg p.o. q. nightly as needed increased to 100 mg p.o. q. nightly as needed to improve sleep quality.  No other changes in her treatment plan at this time.  Casually dressed.  Less depressed mood, affect congruent. Consistent eye contact throughout assessment. Reports stable appetite and mood. Rates anxiety and depression as 5/10.  Reports lightheadedness and slight headache.  Encouraged to get up slowly from bed to standing position and to increase fluid intake.  Reports eating  good breakfast and continues to have Ensure supplements. Endorses improvement of motivation. She denies any SI/HI/AVH. Contracts for safety.   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    Past Surgical History:  Procedure Laterality Date   CESAREAN SECTION     3 previous c sections   EYE SURGERY Bilateral    MUSCLE REPAIR Right 01/10/2017   RIGHT PECTORAL MAJOR TO SCAPULA MUSCLE TRANSFER (Right   NOVASURE ABLATION N/A 03/11/2014   Procedure: NOVASURE ABLATION;  Surgeon: Emily Filbert, MD;  Location: [facility];  Service: Gynecology;  Laterality: N/A;   PECTORALIS TENDON REPAIR Right 01/10/2017   Procedure: RIGHT PECTORAL MAJOR TO SCAPULA MUSCLE TRANSFER;  Surgeon: Meredith Pel, MD;  Location: [place] [facility];  Service: Orthopedics;  Laterality: Right;   PECTORALIS TENDON REPAIR Right 12/28/2017   Procedure: RIGHT REVISION TENDON TRANSFER OF PECTORALIS MAJOR;  Surgeon: Meredith Pel, MD;  Location: [facility];  Service: Orthopedics;  Laterality: Right;   rotator cuff surgery     SHOULDER SURGERY     TUBAL LIGATION     Family History:  Family History  Problem Relation Age of Onset   Diabetes Maternal Grandmother    Diabetes Paternal Grandmother    Diabetes Paternal Grandfather    Colon cancer Neg Hx    Esophageal cancer Neg Hx    Rectal cancer Neg Hx    Stomach cancer Neg Hx    Family Psychiatric History: not noted Social History:  Social History   Substance and Sexual Activity  Alcohol Use  Yes   Alcohol/week: 18.0 standard drinks of alcohol   Types: 18 Cans of beer per week   Comment: three times a week drink 6 beers in one sitting     Social History   Substance and Sexual Activity  Drug Use Yes   Types: Cocaine, Marijuana   Comment: marijuana used 3 times a weekly    Social History   Socioeconomic History   Marital status: Single    Spouse name: Not on file   Number of children: 2   Years of education: 12   Highest education level: Not on file  Occupational History   Occupation: unemployed  Tobacco Use   Smoking status: Former    Years: 5.00    Types: Cigarettes    Quit date: 11/2017    Years since quitting: 4.6    Passive exposure: Never   Smokeless tobacco: Never  Vaping Use   Vaping Use: Never used  Substance and Sexual Activity   Alcohol use: Yes    Alcohol/week: 18.0 standard drinks of alcohol    Types: 18 Cans of beer per week    Comment: three times a week drink 6 beers in one sitting   Drug use: Yes    Types: Cocaine, Marijuana    Comment: marijuana used 3 times a weekly   Sexual activity: Not Currently    Birth control/protection: Surgical  Other Topics Concern   Not on file  Social History Narrative   Patient does not drink caffeine.   Patient is right handed.       Current Social History   (Please include date ( . td) when updating information )      Who lives at home: husband of 68 years and 34 yr old daughter 08/24/2016    Transportation: car 08/24/2016   Important Relationships & Pets: neighbors 08/24/2016    Current Stressors: only income is from husband's job 08/24/2016   Work / Education:  Unable to work due to shoulder 08/24/2016   Religious / Personal Beliefs: not assessed 08/24/2016   Interests / Fun: working to get out more 08/24/2016   Other: going to [place] to establish mental health provider  08/24/2016                                                                                                      Social Determinants  of Health   Financial Resource Strain: Not on file  Food Insecurity: No Food Insecurity (07/28/2022)   Hunger Vital Sign    Worried About Running Out of Food in the Last Year: Never true    [place] in  the Last Year: Never true  Transportation Needs: No Transportation Needs (07/28/2022)   PRAPARE - Hydrologist (Medical): No    Lack of Transportation (Non-Medical): No  Physical Activity: Not on file  Stress: Not on file  Social Connections: Not on file   Additional Social History:   Sleep: Good  Appetite:  Good  Current Medications: Current Facility-Administered Medications  Medication Dose Route Frequency Provider Last Rate Last Admin   [START ON 08/06/2022] alendronate (FOSAMAX) tablet 70 mg  70 mg Oral Weekly Green, Terri L, RPH       alum & mag hydroxide-simeth (MAALOX/MYLANTA) 200-200-20 MG/5ML suspension 30 mL  30 mL Oral Q4H PRN Onuoha, Chinwendu V, NP       Apremilast TABS 30 mg  30 mg Oral BID Massengill, Ovid Curd, MD   30 mg at 08/01/22 0820   calcium-vitamin D (OSCAL WITH D) 500-5 MG-MCG per tablet 1 tablet  1 tablet Oral Daily Massengill, Ovid Curd, MD   1 tablet at 08/01/22 0819   feeding supplement (ENSURE ENLIVE / ENSURE PLUS) liquid 237 mL  237 mL Oral BID BM Massengill, Ovid Curd, MD   237 mL at 07/31/22 2020   ferrous gluconate (FERGON) tablet 324 mg  324 mg Oral Daily Massengill, Ovid Curd, MD   324 mg at 08/01/22 9357   hydrOXYzine (ATARAX) tablet 25 mg  25 mg Oral TID PRN Onuoha, Chinwendu V, NP       ketorolac (ACULAR) 0.5 % ophthalmic solution 1 drop  1 drop Left Eye Daily PRN Massengill, Nathan, MD       magnesium hydroxide (MILK OF MAGNESIA) suspension 30 mL  30 mL Oral Daily PRN Onuoha, Chinwendu V, NP       methocarbamol (ROBAXIN) tablet 500 mg  500 mg Oral Q8H PRN Massengill, Ovid Curd, MD   500 mg at 07/30/22 2108   multivitamin with minerals tablet 1 tablet  1 tablet Oral Daily Massengill, Ovid Curd, MD   1 tablet at 08/01/22 0177    nicotine polacrilex (NICORETTE) gum 2 mg  2 mg Oral PRN Janine Limbo, MD   2 mg at 07/28/22 1811   pantoprazole (PROTONIX) EC tablet 40 mg  40 mg Oral Daily Massengill, Ovid Curd, MD   40 mg at 08/01/22 0819   sertraline (ZOLOFT) tablet 50 mg  50 mg Oral QHS Onuoha, Chinwendu V, NP   50 mg at 07/31/22 2105   traMADol (ULTRAM) tablet 50 mg  50 mg Oral BID Massengill, Ovid Curd, MD   50 mg at 08/01/22 9390   traZODone (DESYREL) tablet 100 mg  100 mg Oral QHS PRN Clarabel Marion, Kris Hartmann, FNP        Lab Results:  Results for orders placed or performed during the hospital encounter of 07/28/22 (from the past 48 hour(s))  Resp panel by RT-PCR (RSV, Flu A&B, Covid) Anterior Nasal Swab     Status: None   Collection Time: 07/31/22 10:09 AM   Specimen: Anterior Nasal Swab  Result Value Ref Range   SARS Coronavirus 2 by RT PCR NEGATIVE NEGATIVE    Comment: (NOTE) SARS-CoV-2 target nucleic acids are NOT DETECTED.  The SARS-CoV-2 RNA is generally detectable in upper respiratory specimens during the acute phase of infection. The lowest concentration of SARS-CoV-2 viral copies this assay can detect is 138 copies/mL. A negative result does not preclude SARS-Cov-2 infection and should not be used as the sole basis for treatment or other patient management decisions. A negative result may occur with  improper  specimen collection/handling, submission of specimen other than nasopharyngeal swab, presence of viral mutation(s) within the areas targeted by this assay, and inadequate number of viral copies(<138 copies/mL). A negative result must be combined with clinical observations, patient history, and epidemiological information. The expected result is Negative.  Fact Sheet for Patients:  EntrepreneurPulse.com.au  Fact Sheet for Healthcare Providers:  IncredibleEmployment.be  This test is no t yet approved or cleared by the [place] FDA and  has been authorized for  detection and/or diagnosis of SARS-CoV-2 by FDA under an Emergency Use Authorization (EUA). This EUA will remain  in effect (meaning this test can be used) for the duration of the COVID-19 declaration under Section 564(b)(1) of the Act, 21 U.S.C.section 360bbb-3(b)(1), unless the authorization is terminated  or revoked sooner.       Influenza A by PCR NEGATIVE NEGATIVE   Influenza B by PCR NEGATIVE NEGATIVE    Comment: (NOTE) The Xpert Xpress SARS-CoV-2/FLU/RSV plus assay is intended as an aid in the diagnosis of influenza from Nasopharyngeal swab specimens and should not be used as a sole basis for treatment. Nasal washings and aspirates are unacceptable for Xpert Xpress SARS-CoV-2/FLU/RSV testing.  Fact Sheet for Patients: EntrepreneurPulse.com.au  Fact Sheet for Healthcare Providers: IncredibleEmployment.be  This test is not yet approved or cleared by the [place] FDA and has been authorized for detection and/or diagnosis of SARS-CoV-2 by FDA under an Emergency Use Authorization (EUA). This EUA will remain in effect (meaning this test can be used) for the duration of the COVID-19 declaration under Section 564(b)(1) of the Act, 21 U.S.C. section 360bbb-3(b)(1), unless the authorization is terminated or revoked.     Resp Syncytial Virus by PCR NEGATIVE NEGATIVE    Comment: (NOTE) Fact Sheet for Patients: EntrepreneurPulse.com.au  Fact Sheet for Healthcare Providers: IncredibleEmployment.be  This test is not yet approved or cleared by the [place] FDA and has been authorized for detection and/or diagnosis of SARS-CoV-2 by FDA under an Emergency Use Authorization (EUA). This EUA will remain in effect (meaning this test can be used) for the duration of the COVID-19 declaration under Section 564(b)(1) of the Act, 21 U.S.C. section 360bbb-3(b)(1), unless the authorization is terminated  or revoked.  Performed at [facility], [place] [address], [place], [place] [address]      Blood Alcohol level:  Lab Results  Component Value Date   [facility] <10 07/27/2022   ETH <5 75/04/2584    Metabolic Disorder Labs: Lab Results  Component Value Date   HGBA1C 5.2 06/02/2021   No results found for: "PROLACTIN" Lab Results  Component Value Date   CHOL 213 (H) 07/22/2019   TRIG 128 07/22/2019   HDL 85 07/22/2019   CHOLHDL 2.5 07/22/2019   LDLCALC 106 (H) 07/22/2019   Physical Findings: AIMS:  , ,  ,  ,    CIWA:    COWS:     Musculoskeletal: Strength & Muscle Tone: within normal limits Gait & Station: normal Patient leans: N/A  Psychiatric Specialty Exam:  Presentation  General Appearance:  Appropriate for Environment; Casual; Fairly Groomed  Eye Contact: Good  Speech: Clear and Coherent; Normal Rate  Speech Volume: Normal  Handedness: Right  Mood and Affect  Mood: Anxious; Depressed  Affect: Congruent  Thought Process  Thought Processes: Coherent  Descriptions of Associations:Intact  Orientation:Full (Time, Place and Person)  Thought Content:Logical  History of Schizophrenia/Schizoaffective disorder:No data recorded Duration of Psychotic Symptoms:No data recorded Hallucinations:Hallucinations: None  Ideas of Reference:None  Suicidal  Thoughts:Suicidal Thoughts: No SI Passive Intent and/or Plan: -- (Denies)  Homicidal Thoughts:Homicidal Thoughts: No  Sensorium  Memory: Immediate Good; Recent Good  Judgment: Good  Insight: Good  Executive Functions  Concentration: Good  Attention Span: Good  Recall: Good  Fund of Knowledge: Good  Language: Good  Psychomotor Activity  Psychomotor Activity: Psychomotor Activity: Normal (Ambulates with a walker)  Assets  Assets: Communication Skills; Desire for Improvement; Physical Health; Resilience  Sleep  Sleep: Sleep: Poor Number of Hours of Sleep:  1  Physical Exam: Physical Exam Vitals and nursing note reviewed.  Constitutional:      Appearance: She is ill-appearing.  HENT:     Head: Normocephalic.     Nose: Nose normal.     Mouth/Throat:     Mouth: Mucous membranes are moist.     Pharynx: Oropharynx is clear.  Eyes:     Pupils: Pupils are equal, round, and reactive to light.  Cardiovascular:     Rate and Rhythm: Normal rate.     Pulses: Normal pulses.  Pulmonary:     Effort: Pulmonary effort is normal.  Abdominal:     General: Abdomen is flat.  Musculoskeletal:     Cervical back: Normal range of motion.  Skin:    General: Skin is warm and dry.  Neurological:     Mental Status: She is alert and oriented to person, place, and time.     Sensory: Sensory deficit present.     Motor: Weakness present.     Coordination: Coordination abnormal.     Gait: Gait abnormal.  Psychiatric:        Attention and Perception: Attention and perception normal. She does not perceive auditory or visual hallucinations.        Mood and Affect: Mood and affect normal.        Speech: Speech normal.        Behavior: Behavior is withdrawn. Behavior is cooperative.        Thought Content: Thought content is not paranoid or delusional. Thought content does not include homicidal or suicidal ideation. Thought content does not include homicidal or suicidal plan.        Cognition and Memory: Cognition and memory normal.        Judgment: Judgment normal.    Review of Systems  Psychiatric/Behavioral:  Positive for depression and substance abuse. Negative for hallucinations and suicidal ideas.   All other systems reviewed and are negative.  Blood pressure 122/73, pulse (!) 56, temperature 99.6 F (37.6 C), temperature source Oral, resp. rate 18, height '5\' 1"'$  (1.549 m), weight 42.2 kg, SpO2 100 %. Body mass index is 17.57 kg/m.  Treatment Plan Summary: Daily contact with patient to assess and evaluate symptoms and progress in treatment, Medication  management, and Plan   PLAN:  Safety and Monitoring: Voluntary admission to inpatient psychiatric unit for safety, stabilization and treatment Daily contact with patient to assess and evaluate symptoms and progress in treatment Patient's case to be discussed in multi-disciplinary team meeting Observation Level : q15 minute checks Vital signs: q12 hours Precautions: suicide, but pt currently verbally contracts for safety on unit    Physician Treatment Plan for Secondary Diagnosis: Principal Problem:   MDD (major depressive disorder), severe ([facility])   Major depressive disorder recurrent severe --Continue:  -Sertraline Zoloft 50 mg p.o. daily HS   Anxiety --Continue:  -Hydroxyzine 25 mg 3 times daily as  needed/anxiety   Insomnia --Continue:  -Trazodone 100 mg p.o. nightly daily  PRN  Smoking cessation: --Continue:  -Nicotine replacement protocol   Scoliosis: -- Continue: -Apremilast 30 mg p.o. twice daily.   Family members to supply   Calcium deficiency: -- Continue:  -Calcium-vitamin D 500-5 mg-micrograms 30 tablets 1 tablet daily --Start home medication:   -Alendronate (Fosamax) 70 mg PO every 7 days.    Nutritional supplement: --Continue:  -Ensure Enlive/Ensure Plus to 37  meal p.o. 2 times daily between meals   Anemia: -- Continue:  -Ferrous gluconate tablet 324 mg  p.o. daily - Patient multivitamin with minerals 1  tablet p.o. daily   Muscle spasm: --Continue:  -Robaxin tablet 500 mg p.o. every 8 hours as needed   GERD: --Continue:  -Protonix EC 40 mg tablet p.o. daily.   Dry/itchy eyes --Continue:  -Ocular 0.5% ophthalmic solution 1 drop to left eye daily as needed   Bone pain:      --Continue:  -Tramadol 50 mg tablet p.o. BID  **Patient to use standard walker for ambulation   Other PRN Medications -Acetaminophen 650 mg every 6 as needed/mild pain -Maalox 30 mL oral every 4 as needed/digestion -Magnesium hydroxide 30 mL daily as  needed/mild constipation   Discharge Planning: Social work and case management to assist with discharge planning and identification of hospital follow-up needs prior to discharge Estimated LOS: 5-7 days Discharge Concerns: Need to establish a safety plan; Medication compliance and effectiveness Discharge Goals: Return home with outpatient referrals for mental health follow-up including medication management/psychotherapy.  Laretta Bolster, FNP 08/01/2022, 1:08 PMPatient ID: Annette Chang, female   DOB: [birth year], 52 y.o.   MRN: [id number] Patient ID: Annette Chang, female   DOB: 03-06-1971, 52 y.o.   MRN: [id number]

## 2022-08-01 NOTE — Group Note (Signed)
Recreation Therapy Group Note   Group Topic:Stress Management  Group Date: 08/01/2022 Start Time: 0935 End Time: 0955 Facilitators: Dijon Kohlman-McCall, LRT,CTRS Location: [address] Day[address]   Goal Area(s) Addresses:  Patient will identify positive stress management techniques. Patient will identify benefits of using stress management post d/c.  Group Description:  Meditation.  LRT played a meditation for the patients that focused on having a fresh new start to your day.  Patients were to listen and follow along as meditation played to fully encompass what the meditation is saying.   Affect/Mood: N/A   Participation Level: Did not attend    Clinical Observations/Individualized Feedback:     Plan: Continue to engage patient in RT group sessions 2-3x/week.   Annette Chang, LRT,CTRS 08/01/2022 12:40 PM

## 2022-08-01 NOTE — Progress Notes (Signed)
D:  Patient's self inventory sheet, patient has poor sleep, no sleep medication given.  Fair appetite, normal energy level, good concentration.  Rated depression, anxiety and hopeless #4  Denied withdrawals.  Did check nausea.  Denied SI  Checked lightheaded, headache.  Worst pain #4 in past 24 hours.  Goal is get better.  Would like to discuss discharge plan with staff. A:  Medications administered per MD orders.  Emotional support and encouragement given patient. R:  Safety maintained with 15 minute checks.

## 2022-08-02 DIAGNOSIS — F322 Major depressive disorder, single episode, severe without psychotic features: Secondary | ICD-10-CM | POA: Diagnosis not present

## 2022-08-02 NOTE — Plan of Care (Signed)
Nurse discussed anxiety, depression and coping skills with patient.  

## 2022-08-02 NOTE — Group Note (Signed)
Date:  08/02/2022 Time:  10:17 AM  Group Topic/Focus:  Goals Group:   The focus of this group is to help patients establish daily goals to achieve during treatment and discuss how the patient can incorporate goal setting into their daily lives to aide in recovery.    Participation Level:  Did Not Attend  Participation Quality:      Affect:      Cognitive:      Insight: None  Engagement in Group:      Modes of Intervention:      Additional Comments:  Did not attend but was encourage to participate in group.  Jerrye Beavers 08/02/2022, 10:17 AM

## 2022-08-02 NOTE — [facility] Group Notes (Signed)
Spiritual care group on grief and loss facilitated by Chaplain Janne Napoleon, [place], Chaplain Genesis Adams and Lysle Morales, [place] intern.    Group Goal:  Support / Education around grief and loss  Members engage in facilitated group support and psycho-social education.  Group Description:  Following introductions and group rules, group members engaged in facilitated group dialog and support around topic of loss, with particular support around experiences of loss in their lives. Group Identified types of loss (relationships / self / things) and identified patterns, circumstances, and changes that precipitate losses. Reflected on thoughts / feelings around loss, normalized grief responses, and recognized variety in grief experience.  Group drew on Adlerian / Rogerian, narrative, MI,  Patient Progress: Did not attend.

## 2022-08-02 NOTE — Progress Notes (Signed)
   08/01/22 2129  Psych Admission Type (Psych Patients Only)  Admission Status Voluntary  Psychosocial Assessment  Patient Complaints Anxiety;Depression  Eye Contact Brief  Facial Expression Anxious  Affect Anxious  Speech Logical/coherent  Interaction Assertive  Motor Activity Slow  Appearance/Hygiene Unremarkable  Behavior Characteristics Cooperative;Appropriate to situation  Mood Anxious  Thought Process  Coherency WDL  Content WDL  Delusions None reported or observed  Perception WDL  Hallucination None reported or observed  Judgment Poor  Confusion None  Danger to Self  Current suicidal ideation? Denies  Self-Injurious Behavior No self-injurious ideation or behavior indicators observed or expressed   Agreement Not to Harm Self Yes  Description of Agreement Verbal  Danger to Others  Danger to Others None reported or observed

## 2022-08-02 NOTE — Progress Notes (Signed)
D:  Patient denied SI and HI, contracts for safety.  Denied A/V hallucinations.  Denied pain. A:  Medications administered per MD orders.  Emotional support and encouragement given patient. R:  Safety maintained with 15 minute checks.  Patient has been attending groups.  Ambulates with walker in hallway, etc.

## 2022-08-02 NOTE — Plan of Care (Signed)
  Problem: Activity: Goal: Imbalance in normal sleep/wake cycle will improve Outcome: Progressing   Problem: Coping: Goal: Coping ability will improve Outcome: Progressing Goal: Will verbalize feelings Outcome: Progressing   Problem: Health Behavior/Discharge Planning: Goal: Ability to make decisions will improve Outcome: Progressing Goal: Compliance with therapeutic regimen will improve Outcome: Progressing

## 2022-08-02 NOTE — Group Note (Signed)
Recreation Therapy Group Note   Group Topic:Animal Assisted Therapy   Group Date: 08/02/2022 Start Time: 1430 End Time: 1515 Facilitators: Torence Palmeri-McCall, LRT,CTRS Location: [address] Dayroom   Animal-Assisted Activity (AAA) Program Checklist/Progress Notes Patient Eligibility Criteria Checklist & Daily Group note for Rec Tx Intervention  AAA/T Program Assumption of Risk Form signed by Patient/ or Parent Legal Guardian Yes  Patient understands his/her participation is voluntary Yes   Affect/Mood: N/A   Participation Level: Did not attend    Clinical Observations/Individualized Feedback:      Plan: Continue to engage patient in RT group sessions 2-3x/week.   Hasna Stefanik-McCall, LRT,CTRS 08/02/2022 3:27 PM

## 2022-08-02 NOTE — Progress Notes (Signed)
Psychoeducational Group Note  Date:  08/02/2022 Time:  2041  Group Topic/Focus:  Wrap-Up Group:   The focus of this group is to help patients review their daily goal of treatment and discuss progress on daily workbooks.  Participation Level: Did Not Attend  Participation Quality:  Not Applicable  Affect:  Not Applicable  Cognitive:  Not Applicable  Insight:  Not Applicable  Engagement in Group: Not Applicable  Additional Comments:  The patient did not attend group this evening.   Archie Balboa S 08/02/2022, 8:41 PM

## 2022-08-02 NOTE — Progress Notes (Signed)
   08/02/22 0548  15 Minute Checks  Location Bedroom  Visual Appearance Calm  Behavior Sleeping  Sleep (Behavioral Health Patients Only)  Calculate sleep? (Click Yes once per 24 hr at 0600 safety check) Yes  Documented sleep last 24 hours 8.25

## 2022-08-02 NOTE — Progress Notes (Signed)
[facility] MD Progress Note  08/02/2022 11:27 AM Annette Chang  MRN:  [id number]  Principal Problem: MDD (major depressive disorder), severe ([place]) Diagnosis: Principal Problem:   MDD (major depressive disorder), severe ([place])  HPI: Annette Chang is a 52 y.o. African American female with past psychiatric history of depression, suicidal ideation, and past medical history of arthritis, scoliosis, chronic pain, and anemia, who initially presented voluntarily to [facility] with increased depression x2 weeks with recent onset of suicidal ideations; patient was transferred to [facility] 07/28/21 . Patient reports she was lying in her bed at home "overwhelmed with my life and seeing myself in a casket". She initially reported cocaine and alcohol use, daily marijuana use, and being compliant with her prescribed psychoactive medications; per chart review patient later admitted to not taking Zoloft in the past. Patient reports prior mental health hospitalization at [facility] about 20 years ago. UDS+ cocaine, THC; BAL<10.   24 hr assessment: Vital signs reviewed within normal values.  Patient noted to be intermittently visible in milieu and attending unit groups. She is ambulating on unit using walker as walking aide.  Medication compliant. PRN nicotine gum for smoking cessation, and trazodone for sleep with requested in the past 24 hours.  Continues on PT consult.   Assessment: Patient seen and examined on [address] lying in bed. Reports improved sleep of 7 hours last night after receiving as needed of trazodone for sleep.  No changes in her treatment plan at this time.  Casually dressed.  Less depressed mood, affect congruent. Consistent eye contact throughout assessment. Reports stable appetite and mood. Rates anxiety and depression as 2/10.  She denies lightheadedness and headache today.  Encouraged to get up slowly from bed to standing position and to increase fluid intake.  Reports eating good breakfast and continues  to have Ensure supplements. Endorses improvement of motivation. She denies any SI/HI/AVH.  No delusional thinking or paranoia observed during encounter.  Continues to contracts for safety.   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    Past Surgical History:  Procedure Laterality Date   CESAREAN SECTION     3 previous c sections   EYE SURGERY Bilateral    MUSCLE REPAIR Right 01/10/2017   RIGHT PECTORAL MAJOR TO SCAPULA MUSCLE TRANSFER (Right   NOVASURE ABLATION N/A 03/11/2014   Procedure: NOVASURE ABLATION;  Surgeon: Emily Filbert, MD;  Location: [facility];  Service: Gynecology;  Laterality: N/A;   PECTORALIS TENDON REPAIR Right 01/10/2017   Procedure: RIGHT PECTORAL MAJOR TO SCAPULA MUSCLE TRANSFER;  Surgeon: Meredith Pel, MD;  Location: [place];  Service: Orthopedics;  Laterality: Right;   PECTORALIS TENDON REPAIR Right 12/28/2017   Procedure: RIGHT REVISION TENDON TRANSFER OF PECTORALIS MAJOR;  Surgeon: Meredith Pel, MD;  Location: [place];  Service: Orthopedics;  Laterality: Right;   rotator cuff surgery     SHOULDER SURGERY     TUBAL LIGATION  Family History:  Family History  Problem Relation Age of Onset   Diabetes Maternal Grandmother    Diabetes Paternal Grandmother    Diabetes Paternal Grandfather    Colon cancer Neg Hx    Esophageal cancer Neg Hx    Rectal cancer Neg Hx    Stomach cancer Neg Hx    Family Psychiatric History: not noted Social History:  Social History    Substance and Sexual Activity  Alcohol Use Yes   Alcohol/week: 18.0 standard drinks of alcohol   Types: 18 Cans of beer per week   Comment: three times a week drink 6 beers in one sitting     Social History   Substance and Sexual Activity  Drug Use Yes   Types: Cocaine, Marijuana   Comment: marijuana used 3 times a weekly    Social History   Socioeconomic History   Marital status: Single    Spouse name: Not on file   Number of children: 2   Years of education: 12   Highest education level: Not on file  Occupational History   Occupation: unemployed  Tobacco Use   Smoking status: Former    Years: 5.00    Types: Cigarettes    Quit date: 11/2017    Years since quitting: 4.6    Passive exposure: Never   Smokeless tobacco: Never  Vaping Use   Vaping Use: Never used  Substance and Sexual Activity   Alcohol use: Yes    Alcohol/week: 18.0 standard drinks of alcohol    Types: 18 Cans of beer per week    Comment: three times a week drink 6 beers in one sitting   Drug use: Yes    Types: Cocaine, Marijuana    Comment: marijuana used 3 times a weekly   Sexual activity: Not Currently    Birth control/protection: Surgical  Other Topics Concern   Not on file  Social History Narrative   Patient does not drink caffeine.   Patient is right handed.       Current Social History   (Please include date ( . td) when updating information )      Who lives at home: husband of 60 years and 30 yr old daughter 08/24/2016    Transportation: car 08/24/2016   Important Relationships & Pets: neighbors 08/24/2016    Current Stressors: only income is from husband's job 08/24/2016   Work / Education:  Unable to work due to shoulder 08/24/2016   Religious / Personal Beliefs: not assessed 08/24/2016   Interests / Fun: working to get out more 08/24/2016   Other: going to [place] to establish mental health provider  08/24/2016                                                                                                       Social Determinants of Health   Financial Resource Strain: Not on file  Food Insecurity: No Food Insecurity (07/28/2022)   Hunger Vital Sign    Worried About Running Out of Food in the Last Year: Never true    Ran Out  of Food in the Last Year: Never true  Transportation Needs: No Transportation Needs (07/28/2022)   PRAPARE - Hydrologist (Medical): No    Lack of Transportation (Non-Medical): No  Physical Activity: Not on file  Stress: Not on file  Social Connections: Not on file   Additional Social History:   Sleep: Good  Appetite:  Good  Current Medications: Current Facility-Administered Medications  Medication Dose Route Frequency Provider Last Rate Last Admin   [START ON 08/06/2022] alendronate (FOSAMAX) tablet 70 mg  70 mg Oral Weekly Green, Terri L, RPH       alum & mag hydroxide-simeth (MAALOX/MYLANTA) 200-200-20 MG/5ML suspension 30 mL  30 mL Oral Q4H PRN Onuoha, Chinwendu V, NP       Apremilast TABS 30 mg  30 mg Oral BID Massengill, Ovid Curd, MD   30 mg at 08/02/22 0741   calcium-vitamin D (OSCAL WITH D) 500-5 MG-MCG per tablet 1 tablet  1 tablet Oral Daily Massengill, Ovid Curd, MD   1 tablet at 08/02/22 6440   feeding supplement (ENSURE ENLIVE / ENSURE PLUS) liquid 237 mL  237 mL Oral BID BM Massengill, Ovid Curd, MD   237 mL at 08/01/22 1438   ferrous gluconate (FERGON) tablet 324 mg  324 mg Oral Daily Massengill, Ovid Curd, MD   324 mg at 08/02/22 3474   hydrOXYzine (ATARAX) tablet 25 mg  25 mg Oral TID PRN Onuoha, Chinwendu V, NP       ketorolac (ACULAR) 0.5 % ophthalmic solution 1 drop  1 drop Left Eye Daily PRN Massengill, Nathan, MD       magnesium hydroxide (MILK OF MAGNESIA) suspension 30 mL  30 mL Oral Daily PRN Onuoha, Chinwendu V, NP       methocarbamol (ROBAXIN) tablet 500 mg  500 mg Oral Q8H PRN Massengill, Ovid Curd, MD   500 mg at 07/30/22 2108   multivitamin with minerals tablet 1 tablet  1 tablet Oral Daily Massengill,  Ovid Curd, MD   1 tablet at 08/02/22 2595   nicotine polacrilex (NICORETTE) gum 2 mg  2 mg Oral PRN Janine Limbo, MD   2 mg at 08/01/22 2129   pantoprazole (PROTONIX) EC tablet 40 mg  40 mg Oral Daily Massengill, Ovid Curd, MD   40 mg at 08/02/22 0742   sertraline (ZOLOFT) tablet 50 mg  50 mg Oral QHS Onuoha, Chinwendu V, NP   50 mg at 08/01/22 2129   traMADol (ULTRAM) tablet 50 mg  50 mg Oral BID Massengill, Ovid Curd, MD   50 mg at 08/02/22 0744   traZODone (DESYREL) tablet 50 mg  50 mg Oral QHS PRN Laretta Bolster, FNP   50 mg at 08/01/22 2129    Lab Results:  No results found for this or any previous visit (from the past 40 hour(s)).    Blood Alcohol level:  Lab Results  Component Value Date   ETH <10 07/27/2022   ETH <5 63/87/5643    Metabolic Disorder Labs: Lab Results  Component Value Date   HGBA1C 5.2 06/02/2021   No results found for: "PROLACTIN" Lab Results  Component Value Date   CHOL 213 (H) 07/22/2019   TRIG 128 07/22/2019   HDL 85 07/22/2019   CHOLHDL 2.5 07/22/2019   LDLCALC 106 (H) 07/22/2019   Physical Findings: AIMS:  , ,  ,  ,    CIWA:    COWS:     Musculoskeletal: Strength & Muscle Tone: within normal limits Gait & Station: normal Patient leans:  N/A  Psychiatric Specialty Exam:  Presentation  General Appearance:  Appropriate for Environment; Casual; Fairly Groomed  Eye Contact: Good  Speech: Clear and Coherent; Normal Rate  Speech Volume: Normal  Handedness: Right  Mood and Affect  Mood: Anxious; Depressed  Affect: Appropriate; Congruent  Thought Process  Thought Processes: Coherent  Descriptions of Associations:Intact  Orientation:Full (Time, Place and Person)  Thought Content:Logical  History of Schizophrenia/Schizoaffective disorder:No data recorded Duration of Psychotic Symptoms:No data recorded Hallucinations:Hallucinations: None  Ideas of Reference:None  Suicidal Thoughts:Suicidal Thoughts: No SI Passive Intent  and/or Plan: -- (n/a)  Homicidal Thoughts:Homicidal Thoughts: No  Sensorium  Memory: Immediate Good; Recent Good  Judgment: Good  Insight: Good  Executive Functions  Concentration: Good  Attention Span: Good  Recall: Good  Fund of Knowledge: Good  Language: Good  Psychomotor Activity  Psychomotor Activity: Psychomotor Activity: Normal  Assets  Assets: Communication Skills; Desire for Improvement; Physical Health; Resilience  Sleep  Sleep: Sleep: Good Number of Hours of Sleep: 7  Physical Exam: Physical Exam Vitals and nursing note reviewed.  Constitutional:      Appearance: She is not ill-appearing (Normal for patient.  Patient at baseline health).  HENT:     Head: Normocephalic.     Nose: Nose normal.     Mouth/Throat:     Mouth: Mucous membranes are moist.     Pharynx: Oropharynx is clear.  Eyes:     Pupils: Pupils are equal, round, and reactive to light.  Cardiovascular:     Rate and Rhythm: Normal rate.     Pulses: Normal pulses.  Pulmonary:     Effort: Pulmonary effort is normal.  Abdominal:     General: Abdomen is flat.  Musculoskeletal:     Cervical back: Normal range of motion.  Skin:    General: Skin is warm and dry.  Neurological:     Mental Status: She is alert and oriented to person, place, and time.     Sensory: Sensory deficit present.     Motor: Weakness present.     Coordination: Coordination abnormal.     Gait: Gait abnormal.  Psychiatric:        Attention and Perception: Attention and perception normal. She does not perceive auditory or visual hallucinations.        Mood and Affect: Mood and affect normal.        Speech: Speech normal.        Behavior: Behavior is withdrawn. Behavior is cooperative.        Thought Content: Thought content is not paranoid or delusional. Thought content does not include homicidal or suicidal ideation. Thought content does not include homicidal or suicidal plan.        Cognition and Memory:  Cognition and memory normal.        Judgment: Judgment normal.    Review of Systems  Psychiatric/Behavioral:  Positive for depression and substance abuse. Negative for hallucinations and suicidal ideas.   All other systems reviewed and are negative.  Blood pressure 109/79, pulse 77, temperature 99.6 F (37.6 C), temperature source Oral, resp. rate 18, height '5\' 1"'$  (1.549 m), weight 42.2 kg, SpO2 100 %. Body mass index is 17.57 kg/m.  Treatment Plan Summary: Daily contact with patient to assess and evaluate symptoms and progress in treatment, Medication management, and Plan   PLAN:  Safety and Monitoring: Voluntary admission to inpatient psychiatric unit for safety, stabilization and treatment Daily contact with patient to assess and evaluate symptoms and progress in treatment  Patient's case to be discussed in multi-disciplinary team meeting Observation Level : q15 minute checks Vital signs: q12 hours Precautions: suicide, but pt currently verbally contracts for safety on unit    Physician Treatment Plan for Secondary Diagnosis: Principal Problem:   MDD (major depressive disorder), severe ([place])   Major depressive disorder recurrent severe --Continue:  -Sertraline Zoloft 50 mg p.o. daily HS   Anxiety --Continue:  -Hydroxyzine 25 mg 3 times daily as  needed/anxiety   Insomnia --Continue:  -Trazodone 100 mg p.o. nightly daily  PRN    Smoking cessation: --Continue:  -Nicotine replacement protocol   Scoliosis: -- Continue: -Apremilast 30 mg p.o. twice daily.   Family members to supply   Calcium deficiency: -- Continue:  -Calcium-vitamin D 500-5 mg-micrograms 30 tablets 1 tablet daily --Start home medication:   -Alendronate (Fosamax) 70 mg PO every 7 days.    Nutritional supplement: --Continue:  -Ensure Enlive/Ensure Plus to 37  meal p.o. 2 times daily between meals   Anemia: -- Continue:  -Ferrous gluconate tablet 324 mg  p.o. daily - Patient multivitamin  with minerals 1  tablet p.o. daily   Muscle spasm: --Continue:  -Robaxin tablet 500 mg p.o. every 8 hours as needed   GERD: --Continue:  -Protonix EC 40 mg tablet p.o. daily.   Dry/itchy eyes --Continue:  -Ocular 0.5% ophthalmic solution 1 drop to left eye daily as needed   Bone pain:      --Continue:  -Tramadol 50 mg tablet p.o. BID  **Patient to use standard walker for ambulation   Other PRN Medications -Acetaminophen 650 mg every 6 as needed/mild pain -Maalox 30 mL oral every 4 as needed/digestion -Magnesium hydroxide 30 mL daily as needed/mild constipation   Discharge Planning: Social work and case management to assist with discharge planning and identification of hospital follow-up needs prior to discharge Estimated LOS: 5-7 days Discharge Concerns: Need to establish a safety plan; Medication compliance and effectiveness Discharge Goals: Return home with outpatient referrals for mental health follow-up including medication management/psychotherapy.  Laretta Bolster, FNP 08/02/2022, 11:27 AMPatient ID: Birdie Sons, female   DOB: 24-Jan-1971, 16 y.o.   MRN: [id number] Patient ID: JAKIYA BOOKBINDER, female   DOB: 1970/08/16, 52 y.o.   MRN: [id number] Patient ID: YANNELY KINTZEL, female   DOB: 1971/04/04, 52 y.o.   MRN: [id number]

## 2022-08-02 NOTE — Group Note (Signed)
LCSW Group Therapy Note  Group Date: 08/02/2022 Start Time: 1100 End Time: 1200   Type of Therapy and Topic:  Group Therapy: Positive Affirmations  Participation Level:  Active   Description of Group:   This group addressed positive affirmation towards self and others.  Patients went around the room and identified two positive things about themselves and two positive things about a peer in the room.  Patients reflected on how it felt to share something positive with others, to identify positive things about themselves, and to hear positive things from others/ Patients were encouraged to have a daily reflection of positive characteristics or circumstances.   Therapeutic Goals: Patients will verbalize two of their positive qualities Patients will demonstrate empathy for others by stating two positive qualities about a peer in the group Patients will verbalize their feelings when voicing positive self affirmations and when voicing positive affirmations of others Patients will discuss the potential positive impact on their wellness/recovery of focusing on positive traits of self and others.  Summary of Patient Progress:  Aoi was actively engaged in the discussion and  was  able to identify positive affirmations about herself as well as other group members. Patient demonstrated good insight into the subject matter, was respectful of peers, participated throughout the entire session.  Therapeutic Modalities:   Cognitive Behavioral Therapy Motivational Interviewing    Windle Guard, LCSW 08/02/2022  1:42 PM

## 2022-08-03 ENCOUNTER — Encounter (HOSPITAL_COMMUNITY): Payer: Self-pay

## 2022-08-03 DIAGNOSIS — F322 Major depressive disorder, single episode, severe without psychotic features: Secondary | ICD-10-CM | POA: Diagnosis not present

## 2022-08-03 DIAGNOSIS — M810 Age-related osteoporosis without current pathological fracture: Secondary | ICD-10-CM | POA: Diagnosis not present

## 2022-08-03 MED ORDER — SERTRALINE HCL 50 MG PO TABS: 50.0000 mg | ORAL_TABLET | Freq: Every day | ORAL | 0 refills | Status: DC

## 2022-08-03 MED ORDER — HYDROXYZINE HCL 25 MG PO TABS: 25.0000 mg | ORAL_TABLET | Freq: Three times a day (TID) | ORAL | 0 refills | Status: DC | PRN

## 2022-08-03 MED ORDER — TRAZODONE HCL 50 MG PO TABS: 50.0000 mg | ORAL_TABLET | Freq: Every evening | ORAL | 0 refills | Status: DC | PRN

## 2022-08-03 NOTE — BH IP Treatment Plan (Signed)
Interdisciplinary Treatment and Diagnostic Plan Update  08/03/2022 Time of Session: 10:00am  Annette Chang MRN: [id number]  Principal Diagnosis: MDD (major depressive disorder), severe ([place])  Secondary Diagnoses: Principal Problem:   MDD (major depressive disorder), severe ([place])   Current Medications:  Current Facility-Administered Medications  Medication Dose Route Frequency Provider Last Rate Last Admin   [START ON 08/06/2022] alendronate (FOSAMAX) tablet 70 mg  70 mg Oral Weekly Green, Terri L, RPH       alum & mag hydroxide-simeth (MAALOX/MYLANTA) 200-200-20 MG/5ML suspension 30 mL  30 mL Oral Q4H PRN Onuoha, Chinwendu V, NP       Apremilast TABS 30 mg  30 mg Oral BID Massengill, Ovid Curd, MD   30 mg at 08/02/22 1702   calcium-vitamin D (OSCAL WITH D) 500-5 MG-MCG per tablet 1 tablet  1 tablet Oral Daily Massengill, Nathan, MD   1 tablet at 08/03/22 0815   feeding supplement (ENSURE ENLIVE / ENSURE PLUS) liquid 237 mL  237 mL Oral BID BM Massengill, Ovid Curd, MD   237 mL at 08/03/22 0817   ferrous gluconate (FERGON) tablet 324 mg  324 mg Oral Daily Massengill, Ovid Curd, MD   324 mg at 08/03/22 0815   hydrOXYzine (ATARAX) tablet 25 mg  25 mg Oral TID PRN Onuoha, Chinwendu V, NP       ketorolac (ACULAR) 0.5 % ophthalmic solution 1 drop  1 drop Left Eye Daily PRN Massengill, Nathan, MD       magnesium hydroxide (MILK OF MAGNESIA) suspension 30 mL  30 mL Oral Daily PRN Onuoha, Chinwendu V, NP       methocarbamol (ROBAXIN) tablet 500 mg  500 mg Oral Q8H PRN Massengill, Ovid Curd, MD   500 mg at 07/30/22 2108   multivitamin with minerals tablet 1 tablet  1 tablet Oral Daily Massengill, Nathan, MD   1 tablet at 08/03/22 5009   nicotine polacrilex (NICORETTE) gum 2 mg  2 mg Oral PRN Massengill, Ovid Curd, MD   2 mg at 08/02/22 2106   pantoprazole (PROTONIX) EC tablet 40 mg  40 mg Oral Daily Massengill, Ovid Curd, MD   40 mg at 08/03/22 0814   sertraline (ZOLOFT) tablet 50 mg  50 mg Oral QHS Onuoha,  Chinwendu V, NP   50 mg at 08/02/22 2104   traMADol (ULTRAM) tablet 50 mg  50 mg Oral BID Massengill, Ovid Curd, MD   50 mg at 08/03/22 0814   traZODone (DESYREL) tablet 50 mg  50 mg Oral QHS PRN Laretta Bolster, FNP   50 mg at 08/01/22 2129   Current Outpatient Medications  Medication Sig Dispense Refill   alendronate (FOSAMAX) 70 MG tablet Take 1 tablet (70 mg total) by mouth every 7 (seven) days. Take with a full glass of water on an empty stomach. 4 tablet 4   Apremilast (OTEZLA) 30 MG TABS Take 1 tablet (30 mg total) by mouth 2 (two) times daily. 28 tablet 12   Calcium Carbonate Antacid (ALKA-SELTZER ANTACID PO) Take 1 tablet by mouth daily as needed (gas, flatulance).     Calcium-Vitamin D-Vitamin K [id number] MG-UNT-MCG CHEW Chew 1 tablet by mouth daily.     Ferrous Gluconate (IRON 27 PO) Take 27 mg by mouth daily.     hydrOXYzine (ATARAX) 25 MG tablet Take 1 tablet (25 mg total) by mouth 3 (three) times daily as needed for anxiety. 30 tablet 0   ketorolac (ACULAR) 0.4 % SOLN Place 1 drop into the left eye daily as needed (pain).  methocarbamol (ROBAXIN) 500 MG tablet TAKE ONE TABLET BY MOUTH every 8 HOURS AS NEEDED FOR muscle SPASMS (Patient taking differently: Take 500 mg by mouth every 8 (eight) hours as needed for muscle spasms.) 30 tablet 0   nicotine polacrilex (RA NICOTINE GUM) 2 MG gum Take 1 each (2 mg total) by mouth as needed for smoking cessation. 20 each 1   omeprazole (PRILOSEC) 20 MG capsule TAKE ONE CAPSULE BY MOUTH EVERY DAY 30 capsule PRN   sertraline (ZOLOFT) 50 MG tablet Take 1 tablet (50 mg total) by mouth daily. 30 tablet 0   traMADol (ULTRAM) 50 MG tablet Take 1 tablet (50 mg total) by mouth 2 (two) times daily. 30 tablet 0   traZODone (DESYREL) 50 MG tablet Take 1 tablet (50 mg total) by mouth at bedtime as needed for sleep. 30 tablet 0   vitamin C (ASCORBIC ACID) 500 MG tablet Take 500 mg by mouth daily.      PTA Medications: No medications prior to admission.     Patient Stressors: Health problems    Patient Strengths: Motivation for treatment/growth  Supportive family/friends   Treatment Modalities: Medication Management, Group therapy, Case management,  1 to 1 session with clinician, Psychoeducation, Recreational therapy.   Physician Treatment Plan for Primary Diagnosis: MDD (major depressive disorder), severe ([place]) Long Term Goal(s): Improvement in symptoms so as ready for discharge   Short Term Goals: Ability to identify changes in lifestyle to reduce recurrence of condition will improve Ability to verbalize feelings will improve Ability to disclose and discuss suicidal ideas Ability to demonstrate self-control will improve Ability to identify and develop effective coping behaviors will improve Ability to maintain clinical measurements within normal limits will improve Compliance with prescribed medications will improve Ability to identify triggers associated with substance abuse/mental health issues will improve  Medication Management: Evaluate patient's response, side effects, and tolerance of medication regimen.  Therapeutic Interventions: 1 to 1 sessions, Unit Group sessions and Medication administration.  Evaluation of Outcomes: Adequate for Discharge  Physician Treatment Plan for Secondary Diagnosis: Principal Problem:   MDD (major depressive disorder), severe ([place])  Long Term Goal(s): Improvement in symptoms so as ready for discharge   Short Term Goals: Ability to identify changes in lifestyle to reduce recurrence of condition will improve Ability to verbalize feelings will improve Ability to disclose and discuss suicidal ideas Ability to demonstrate self-control will improve Ability to identify and develop effective coping behaviors will improve Ability to maintain clinical measurements within normal limits will improve Compliance with prescribed medications will improve Ability to identify triggers associated with  substance abuse/mental health issues will improve     Medication Management: Evaluate patient's response, side effects, and tolerance of medication regimen.  Therapeutic Interventions: 1 to 1 sessions, Unit Group sessions and Medication administration.  Evaluation of Outcomes: Adequate for Discharge   RN Treatment Plan for Primary Diagnosis: MDD (major depressive disorder), severe ([place]) Long Term Goal(s): Knowledge of disease and therapeutic regimen to maintain health will improve  Short Term Goals: Ability to remain free from injury will improve, Ability to participate in decision making will improve, Ability to verbalize feelings will improve, Ability to disclose and discuss suicidal ideas, and Ability to identify and develop effective coping behaviors will improve  Medication Management: RN will administer medications as ordered by provider, will assess and evaluate patient's response and provide education to patient for prescribed medication. RN will report any adverse and/or side effects to prescribing provider.  Therapeutic Interventions: 1 on 1  counseling sessions, Psychoeducation, Medication administration, Evaluate responses to treatment, Monitor vital signs and CBGs as ordered, Perform/monitor CIWA, COWS, AIMS and Fall Risk screenings as ordered, Perform wound care treatments as ordered.  Evaluation of Outcomes: Adequate for Discharge   LCSW Treatment Plan for Primary Diagnosis: MDD (major depressive disorder), severe ([place]) Long Term Goal(s): Safe transition to appropriate next level of care at discharge, Engage patient in therapeutic group addressing interpersonal concerns.  Short Term Goals: Engage patient in aftercare planning with referrals and resources, Increase social support, Increase emotional regulation, Facilitate acceptance of mental health diagnosis and concerns, Identify triggers associated with mental health/substance abuse issues, and Increase skills for wellness and  recovery  Therapeutic Interventions: Assess for all discharge needs, 1 to 1 time with Social worker, Explore available resources and support systems, Assess for adequacy in community support network, Educate family and significant other(s) on suicide prevention, Complete Psychosocial Assessment, Interpersonal group therapy.  Evaluation of Outcomes: Adequate for Discharge   Progress in Treatment: Attending groups: Yes. Participating in groups: Yes. Taking medication as prescribed: Yes. Toleration medication: Yes. Family/Significant other contact made: Yes, individual(s) contacted:  Fermin Schwab [phone] (Mother) Patient understands diagnosis: Yes. Discussing patient identified problems/goals with staff: Yes. Medical problems stabilized or resolved: Yes. Denies suicidal/homicidal ideation: Yes. Issues/concerns per patient self-inventory: Yes. Other:    New problem(s) identified: No, Describe:  None reported   New Short Term/Long Term Goal(s): medication stabilization, elimination of SI thoughts, development of comprehensive mental wellness plan.    Patient Goals:  Reduce negative thought patterns   Discharge Plan or Barriers: Patient recently admitted. CSW will continue to follow and assess for appropriate referrals and possible discharge planning.    Reason for Continuation of Hospitalization: Medication stabilization    Estimated Length of Stay: Adequate for Discharge   Last [place] Suicide Severity Risk Score: Flowsheet Row Admission (Discharged) from 07/28/2022 in [facility] ED from 07/27/2022 in [facility] ED from 03/29/2022 in [facility] Low Risk Low Risk No Risk       Last PHQ 2/9 Scores:    07/08/2022    3:27 PM 11/09/2021    2:13 PM 10/12/2021    2:17 PM  Depression screen PHQ 2/9  Decreased Interest 0 1 1  Down, Depressed,  Hopeless 0 0 0  PHQ - 2 Score 0 1 1  Altered sleeping 1 0 1  Tired, decreased energy '1 1 2  '$ Change in appetite 0 1 0  Feeling bad or failure about yourself  0 0 0  Trouble concentrating 0 0 1  Moving slowly or fidgety/restless 0 0 0  Suicidal thoughts 0 0 0  PHQ-9 Score '2 3 5  '$ Difficult doing work/chores Somewhat difficult Somewhat difficult Somewhat difficult    Scribe for Treatment Team: Darleen Crocker, Latanya Presser 08/03/2022 2:32 PM

## 2022-08-03 NOTE — Group Note (Signed)
Recreation Therapy Group Note   Group Topic:Problem Solving  Group Date: 08/03/2022 Start Time: 0930 End Time: 1002 Facilitators: Ziv Welchel-McCall, LRT,CTRS Location: [address] Dayroom   Goal Area(s) Addresses:  Patient will effectively work with peer towards shared goal.  Patient will identify skills used to make activity successful.  Patient will identify how skills used during activity can be used to reach post d/c goals.   Group Description: Landing Pad. In teams of 3-5, patients were given 12 plastic drinking straws and an equal length of masking tape. Using the materials provided, patients were asked to build a landing pad to catch a golf ball dropped from approximately 5 feet in the air. All materials were required to be used by the team in their design. LRT facilitated post-activity discussion.   Affect/Mood: N/A   Participation Level: Did not attend    Clinical Observations/Individualized Feedback:     Plan: Continue to engage patient in RT group sessions 2-3x/week.   Daishaun Ayre-McCall, LRT,CTRS  08/03/2022 11:02 AM

## 2022-08-03 NOTE — Discharge Summary (Signed)
Physician Discharge Summary Note  Patient:  Annette Chang is an 52 y.o., female  MRN:  [id number]  DOB:  1970-12-12  Patient phone:  [phone] (home)   Patient address:   [address] [place] [address],   Total Time spent with patient: 1 hour  Date of Admission:  07/28/2022 Date of Discharge:  08/03/2022  Reason for Admission:  Annette Chang is a 52 y.o. AA female with past psychiatric history of depression, suicidal ideation and past medical history of arthritis, poliosis, chronic pain, and anemia, presents [facility] to [facility] from [facility] ED for worsening depression with suicidal ideation x 2 weeks. States she was lying in her bed at home "overwhelmed with my life and seeing myself in a casket". She does not have a plan to hurt herself now, however, is worried what she might do.  Patient called her neighbor due to having suicidal thoughts, and neighbor encouraged her to go to [facility] emergency room for evaluation.  Patient reports cocaine and alcohol use, and being compliant with her prescribed psychoactive medications.  Patient reports prior mental health hospitalization at [facility] about 20 years ago.   Principal Problem: MDD (major depressive disorder), severe ([place]) Discharge Diagnoses: Principal Problem:   MDD (major depressive disorder), severe ([place])  Past Psychiatric History: Previous Psych Diagnoses: Major depressive disorder severe and suicidal ideation Prior inpatient treatment: Yes, [facility] Current/prior outpatient treatment: Yes, seen therapies about 3 years ago Prior rehab hx: Not applicable, patient denies Psychotherapy hx: Patient denies History of suicide: Patient reports suicidal thoughts History of homicide or aggression: Patient denies Psychiatric medication history: Patient taking Zoloft for depression Psychiatric medication compliance history:  Yes, patient compliant with medications Neuromodulation history: Not applicable Current Psychiatrist: Patient denies Current therapist: Not currently  Family Psychiatric  History: Psych: Patient denies Psych Rx: None denies SA/HA: Denies Substance use family hx: Patient denies   Social History:  Social History   Substance and Sexual Activity  Alcohol Use Yes   Alcohol/week: 18.0 standard drinks of alcohol   Types: 18 Cans of beer per week   Comment: three times a week drink 6 beers in one sitting     Social History   Substance and Sexual Activity  Drug Use Yes   Types: Cocaine, Marijuana   Comment: marijuana used 3 times a weekly    Social History   Socioeconomic History   Marital status: Single    Spouse name: Not on file   Number of children: 2   Years of education: 12   Highest education level: Not on file  Occupational History   Occupation: unemployed  Tobacco Use   Smoking status: Former    Years: 5.00    Types: Cigarettes    Quit date: 11/2017    Years since quitting: 4.6    Passive exposure: Never   Smokeless tobacco: Never  Vaping Use   Vaping Use: Never used  Substance and Sexual Activity   Alcohol use: Yes    Alcohol/week: 18.0 standard drinks of alcohol    Types: 18 Cans of beer per week    Comment: three times a week drink 6 beers in one sitting   Drug use: Yes    Types: Cocaine, Marijuana    Comment: marijuana used 3 times a weekly   Sexual activity: Not Currently    Birth control/protection: Surgical  Other Topics Concern   Not on file  Social History Narrative   Patient does not drink caffeine.   Patient is right handed.       Current Social History   (Please include date ( . td) when updating information )      Who lives at home: husband of 17 years and 86 yr old daughter 08/24/2016    Transportation: car 08/24/2016   Important Relationships & Pets: neighbors 08/24/2016    Current Stressors: only income is from husband's job 08/24/2016    Work / Education:  Unable to work due to shoulder 08/24/2016   Religious / Personal Beliefs: not assessed 08/24/2016   Interests / Fun: working to get out more 08/24/2016   Other: going to [place] to establish mental health provider  08/24/2016                                                                                                      Social Determinants of Health   Financial Resource Strain: Not on file  Food Insecurity: No Food Insecurity (07/28/2022)   Hunger Vital Sign    Worried About Running Out of Food in the Last Year: Never true    [place] in the Last Year: Never true  Transportation Needs: No Transportation Needs (07/28/2022)   PRAPARE - Hydrologist (Medical): No    Lack of Transportation (Non-Medical): No  Physical Activity: Not on file  Stress: Not on file  Social Connections: Not on file    Hospital Course:  Hospital Course:   Patient was admitted to the Adult unit at [facility] under the service of Dr. Caswell Corwin.  Routine labs reviewed, no new labs. CBC with diff: RBC 3.37 low otherwise normal.  CMP: Total protein 8.7 high, bilirubin direct 0.5 high, indirect bilirubin 1.2 high otherwise noraml.  Blood toxicology: Positive for cocaine.  UDS: Positive for tetrahydrocannabinol and cocaine.  EKG: Sinus rhythm with fusion complexes, right atrial enlargement.  Patient is to follow-up with outpatient PCP for all abnormal labs. Maintained Q 15 minutes observation for safety. During this admission patient did not require any change in her observation level and no PRN or time out was required. Length of stay was 7 days.  During this hospitalization the patient received psychosocial  assessment. Patient participated in all forms of therapy including; group, milieu, and family therapy. Psychotherapy: Social and Airline pilot, anti-bullying, learning based strategies, cognitive behavioral, and family object  relations individuation separation intervention psychotherapies can be considered.  During this admission patient met with her psychiatrist daily and received full nursing service. An individualized treatment plan according to the patient's age, level of functioning, diagnostic considerations and acute behavior was initiated.  To continue to reduce current symptoms to base line and improve the patient's overall level of functioning, patient continued Zoloft tablet 50 mg p.o. daily at bedtime for depression, Fosamax tablet 70 mg q. weekly was held, calcium vitamin D 1 tablet p.o. daily, feeding supplement to 237 mL p.o. 2 times daily with meals, ferrous gluconate tablet 324 mg p.o. daily,  ACULAR 0.5% ophthalmic solution to the left eye daily as needed, Robaxin tablet 500 mg p.o. every 8 hours as needed muscle spasm, multivitamin with minerals 1 tablet p.o. daily, nicotine gum 2 mg p.o. for smoking cessation, Protonix EC tablet 40 mg p.o. daily, and tramadol tablet 50 mg p.o. 2 times daily.  Patient tolerating medications well with no side effects at discharge.                      Patient was able to verbalize reasons for living and a safety plan was developed.  She appears to have a positive outlook and is agreeable to continued therapy and medication management. Safety plan was discussed with patient who is in agreement. Patient was provided with the national suicide hotline # 1-800-273-TALK as well as [facility]  number. Medications on admission were Zoloft 50 mg p.o. daily at bedtime, hydroxyzine 25 mg p.o. 3 times daily as needed for anxiety, trazodone 50 mg p.o. q. nightly as needed for insomnia, nicotine replacement protocol for smoking cessation, calcium vitamin D 500 mg - 5 mg 1 tablet p.o. daily, Ensure  nutritional supplement 2 times daily between meals, iron gluconate 324 mg p.o. daily, multivitamin with minerals 1 tablet p.o. daily, Robaxin tablet 500 mg every 8 hours as needed  for muscle spasm, Protonix EC 40 mg tablet p.o. daily, for GERD, oh, 0.5% ophthalmic solution 1 drop to left eye as needed for dry eyes, and tramadol 50 mg p.o. 2 times daily for bone pain. Patient has responded well to these medications and is sleeping better.  At discharge, patient is medically stable and basic physical exam is within normal limits with no abnormal findings.   Patient appeared to benefit from the structure and consistency of the inpatient unit, continued current medication therapy, and integrated therapies. During the hospitalization the patient gradually improved and expressed no suicidal or homicidal thoughts, plan or intent to act on a plan. Her depressive symptoms and anxiety have appeared to subside and she has a positive outlook on her life going forward. She is able to express her feelings, and recognize when she is feeling overwhelmed. She has been calm and cooperative with no behavioral issues during this hospitalization.  A discharge conference was held with during which findings, recommendations, safety plans and aftercare plan were discussed with the patient. Please refer to the therapist note for further information about issues discussed on family session. At discharge patient denied psychotic symptoms, suicidal and homicidal ideation, intent or plan, and there was no evidence of manic or  Depressive symptoms. Patient is discharged home in stable condition.  Physical Findings: AIMS:  , ,  ,  ,    CIWA:    COWS:     Musculoskeletal: Strength & Muscle Tone: within normal limits Gait & Station: normal Patient leans: N/A  Psychiatric Specialty Exam:  Presentation  General Appearance:  Appropriate for Environment; Casual; Fairly Groomed  Eye Contact: Good  Speech: Clear and Coherent; Normal Rate  Speech Volume: Normal  Handedness: Right  Mood and Affect  Mood: Anxious; Depressed  Affect: Appropriate; Congruent  Thought Process  Thought  Processes: Coherent  Descriptions of Associations:Intact  Orientation:Full (Time, Place and Person)  Thought Content:Logical  History of Schizophrenia/Schizoaffective disorder:No data recorded Duration of Psychotic Symptoms:No data recorded Hallucinations:Hallucinations: None  Ideas of Reference:None  Suicidal Thoughts:Suicidal Thoughts: No SI Passive Intent and/or Plan: -- (n/a)  Homicidal Thoughts:Homicidal Thoughts: No  Sensorium  Memory: Immediate Good;  Recent Good  Judgment: Good  Insight: Good  Executive Functions  Concentration: Good  Attention Span: Good  Recall: Good  Fund of Knowledge: Good  Language: Good  Psychomotor Activity  Psychomotor Activity: Psychomotor Activity: Normal  Assets  Assets: Communication Skills; Desire for Improvement; Physical Health; Resilience  Sleep  Sleep: Sleep: Good Number of Hours of Sleep: 7  Physical Exam: Physical Exam Vitals reviewed.  HENT:     Head: Normocephalic.     Right Ear: External ear normal.     Left Ear: External ear normal.     Nose: Nose normal.     Mouth/Throat:     Mouth: Mucous membranes are moist.     Pharynx: Oropharynx is clear.  Eyes:     Extraocular Movements: Extraocular movements intact.     Conjunctiva/sclera: Conjunctivae normal.     Pupils: Pupils are equal, round, and reactive to light.  Cardiovascular:     Rate and Rhythm: Normal rate.     Pulses: Normal pulses.     Comments: Blood pressure 107/90, pulse 67.  Nursing staff to recheck vital signs Pulmonary:     Effort: Pulmonary effort is normal.  Abdominal:     Palpations: Abdomen is soft.  Genitourinary:    Comments: Deferred Musculoskeletal:        General: Normal range of motion.     Cervical back: Normal range of motion.  Skin:    General: Skin is warm.  Neurological:     General: No focal deficit present.     Mental Status: She is alert and oriented to person, place, and time.  Psychiatric:         Mood and Affect: Mood normal.        Behavior: Behavior normal.        Thought Content: Thought content normal.    Review of Systems  Constitutional: Negative.   HENT: Negative.    Eyes: Negative.   Respiratory: Negative.    Cardiovascular: Negative.        Blood pressure 107/90, pulse 67.  Nursing staff to recheck vital signs  Gastrointestinal: Negative.   Genitourinary: Negative.   Musculoskeletal: Negative.   Skin: Negative.   Neurological: Negative.   Endo/Heme/Allergies: Negative.   Psychiatric/Behavioral:  Positive for depression (Stable with medications), substance abuse and suicidal ideas (Patient denies). The patient has insomnia (Improved with medication).    Blood pressure (!) 107/90, pulse 67, temperature 98.5 F (36.9 C), temperature source Oral, resp. rate 18, height '5\' 1"'$  (1.549 m), weight 42.2 kg, SpO2 100 %. Body mass index is 17.57 kg/m.   Social History   Tobacco Use  Smoking Status Former   Years: 5.00   Types: Cigarettes   Quit date: 11/2017   Years since quitting: 4.6   Passive exposure: Never  Smokeless Tobacco Never   Tobacco Cessation:  A prescription for an FDA-approved tobacco cessation medication provided at discharge   Blood Alcohol level:  Lab Results  Component Value Date   [facility] <10 07/27/2022   ETH <5 29/93/7169    Metabolic Disorder Labs:  Lab Results  Component Value Date   HGBA1C 5.2 06/02/2021   No results found for: "PROLACTIN" Lab Results  Component Value Date   CHOL 213 (H) 07/22/2019   TRIG 128 07/22/2019   HDL 85 07/22/2019   CHOLHDL 2.5 07/22/2019   LDLCALC 106 (H) 07/22/2019    See Psychiatric Specialty Exam and Suicide Risk Assessment completed by Attending Physician prior to discharge.  Discharge destination:  Home  Is patient on multiple antipsychotic therapies at discharge:  No   Has Patient had three or more failed trials of antipsychotic monotherapy by history:  No  Recommended Plan for Multiple  Antipsychotic Therapies: NA  Discharge Instructions     Diet - low sodium heart healthy   Complete by: As directed    Increase activity slowly   Complete by: As directed       Allergies as of 08/03/2022       Reactions   Percocet [oxycodone-acetaminophen] Itching   Robaxin [methocarbamol] Rash   Denies current allergy        Medication List     TAKE these medications      Indication  alendronate 70 MG tablet Commonly known as: FOSAMAX Take 1 tablet (70 mg total) by mouth every 7 (seven) days. Take with a full glass of water on an empty stomach.  Indication: home med   ALKA-SELTZER ANTACID PO Take 1 tablet by mouth daily as needed (gas, flatulance).  Indication: home med   ascorbic acid 500 MG tablet Commonly known as: VITAMIN C Take 500 mg by mouth daily.  Indication: home med   Calcium-Vitamin D-Vitamin K 500-500-40 MG-UNT-MCG Chew Chew 1 tablet by mouth daily.  Indication: home med   hydrOXYzine 25 MG tablet Commonly known as: ATARAX Take 1 tablet (25 mg total) by mouth 3 (three) times daily as needed for anxiety.  Indication: Feeling Anxious   IRON 27 PO Take 27 mg by mouth daily.  Indication: iron deficiency   ketorolac 0.4 % Soln Commonly known as: ACULAR Place 1 drop into the left eye daily as needed (pain).  Indication: Postoperative Eye Pain   methocarbamol 500 MG tablet Commonly known as: ROBAXIN TAKE ONE TABLET BY MOUTH every 8 HOURS AS NEEDED FOR muscle SPASMS What changed: See the new instructions.  Indication: Musculoskeletal Pain   nicotine polacrilex 2 MG gum Commonly known as: RA Nicotine Gum Take 1 each (2 mg total) by mouth as needed for smoking cessation.  Indication: Nicotine Addiction   omeprazole 20 MG capsule Commonly known as: PRILOSEC TAKE ONE CAPSULE BY MOUTH EVERY DAY  Indication: Gastroesophageal Reflux Disease   Otezla 30 MG Tabs Generic drug: Apremilast Take 1 tablet (30 mg total) by mouth 2 (two) times  daily.  Indication: home med   sertraline 50 MG tablet Commonly known as: ZOLOFT Take 1 tablet (50 mg total) by mouth daily.  Indication: Major Depressive Disorder   traMADol 50 MG tablet Commonly known as: ULTRAM Take 1 tablet (50 mg total) by mouth 2 (two) times daily.  Indication: Pain   traZODone 50 MG tablet Commonly known as: DESYREL Take 1 tablet (50 mg total) by mouth at bedtime as needed for sleep.  Indication: [place]. Go to.   Specialty: Behavioral Health Why: Please go to this provider for an assessment, to obtain therapy and medication management services on Monday through Friday, arrive no later than 7:20 am as services are first come, first served. Contact information: [place] [phone]                Follow-up recommendations:  Activity:  As tolerated.  Patient to use a rolling walker with all ambulation. Diet:  Regular diet with low sodium  Comments:  Discharge Recommendations:  The patient is being discharged with her family. Patient is  to take her discharge medications as ordered.  See follow up above. We recommend that she participate in individual therapy to target uncontrollable agitation and substance abuse.  We recommend that she participate in family therapy to target the conflict with her family, to improve communication skills and conflict resolution skills.  Patient is to initiate/implement a contingency based behavioral model to address patient's behavior. We recommend that she get AIMS scale, height, weight, blood pressure, fasting lipid panel, fasting blood sugar in three months from discharge as she's on atypical antipsychotics.  Patient will benefit from monitoring of recurrent suicidal ideation since patient is on antidepressant medication. The patient should abstain from all illicit substances and alcohol.  If  the patient's symptoms worsen or do not continue to improve or if the patient becomes actively suicidal or homicidal then it is recommended that the patient return to the closest hospital emergency room or call 911 for further evaluation and treatment. National Suicide Prevention Lifeline 1800-SUICIDE or 713-591-6944. Please follow up with your primary medical doctor for all other medical needs.  The patient has been educated on the possible side effects to medications and she is to contact a medical professional and inform outpatient provider of any new side effects of medication. She is to take regular diet and activity as tolerated.  Will benefit from moderate daily exercise. Patient was educated about removing/locking any firearms, medications or dangerous products from the home.  Signed: Laretta Bolster, FNP 08/03/2022, 9:57 AM

## 2022-08-03 NOTE — Progress Notes (Signed)
   08/03/22 0100  Psych Admission Type (Psych Patients Only)  Admission Status Voluntary  Psychosocial Assessment  Patient Complaints Anxiety  Eye Contact Fair  Facial Expression Animated  Affect Appropriate to circumstance  Speech Logical/coherent  Interaction Assertive  Motor Activity Slow  Appearance/Hygiene Unremarkable  Behavior Characteristics Appropriate to situation;Cooperative  Mood Pleasant  Thought Process  Coherency WDL  Content WDL  Delusions None reported or observed  Perception WDL  Hallucination None reported or observed  Judgment Poor  Confusion None  Danger to Self  Current suicidal ideation? Denies  Agreement Not to Harm Self Yes  Description of Agreement verbal  Danger to Others  Danger to Others None reported or observed

## 2022-08-03 NOTE — Progress Notes (Signed)
  [facility] Adult Case Management Discharge Plan :  Will you be returning to the same living situation after discharge:  Yes,  pt will return to her home and is anticipating receiving a walker prior to discharge. At discharge, do you have transportation home?: Yes,  Fermin Schwab [phone] (Mother) Do you have the ability to pay for your medications: Yes,  Insured  Release of information consent forms completed and in the chart;  Patient's signature needed at discharge.  Patient to Follow up at:  [place]. Go to.   Specialty: Behavioral Health Why: Please go to this provider for an assessment, to obtain therapy and medication management services on Monday through Friday, arrive no later than 7:20 am as services are first come, first served. Contact information: [place] [phone]                Next level of care provider has access to [facility] and Suicide Prevention discussed: Yes,  Fermin Schwab [phone] (Mother)     Has patient been referred to the Quitline?: Patient refused referral  Patient has been referred for addiction treatment: N/A Patient to continue working towards treatment goals after discharge. Patient no longer meets criteria for inpatient criteria per attending physician. Continue taking medications as prescribed, nursing to provide instructions at discharge. Follow up with all scheduled appointments.   Cedrick Partain S Osvaldo Lamping, LCSW 08/03/2022, 9:33 AM

## 2022-08-03 NOTE — Progress Notes (Signed)
Patient discharged from [facility] on 08/03/22 at 1250. Patient denies SI, plan, and intention. Suicide safety plan completed, reviewed with this RN, given to the patient, and a copy in the chart. Patient denies HI/AVH upon discharge. Patient rates her depression a 3/10 and her anxiety a 2/10. Patient is alert, oriented, and cooperative. RN provided patient with discharge paperwork and reviewed information with patient. Patient expressed that she understood all of the discharge instructions. Pt was satisfied with belongings returned to her from the locker and at bedside. Discharged patient to [facility] waiting room. Pt's mother awaiting patient in the [facility] waiting room.

## 2022-08-03 NOTE — Discharge Instructions (Signed)
-  Follow-up with your outpatient psychiatric provider -instructions on appointment date, time, and address (location) are provided to you in discharge paperwork.  -Take your psychiatric medications as prescribed at discharge - instructions are provided to you in the discharge paperwork  -Follow-up with outpatient primary care doctor and other specialists -for management of preventative medicine and any chronic medical disease.  -Recommend abstinence from alcohol, tobacco, and other illicit drug use at discharge.   -If your psychiatric symptoms recur, worsen, or if you have side effects to your psychiatric medications, call your outpatient psychiatric provider, 911, [phone] or go to the nearest emergency department.  -If suicidal thoughts occur, call your outpatient psychiatric provider, 911, [phone] or go to the nearest emergency department.  Naloxone (Narcan) can help reverse an overdose when given to the victim quickly.  [place] offers free naloxone kits and instructions/training on its use.  Add naloxone to your first aid kit and you can help save a life.   Pick up your free kit at the following locations:   [place]:  [facility], [address] [place] [address] ([phone]) [facility] [address] [place] [id number] ([phone]) [facility] [address] [place] [address]  [place]: [facility] [address] [place] [address] ([phone]) [facility] [address] [place] [address] ([phone])  

## 2022-08-03 NOTE — BHH Suicide Risk Assessment (Signed)
Suicide Risk Assessment  Discharge Assessment    [facility] Discharge Suicide Risk Assessment  Principal Problem: MDD (major depressive disorder), severe ([place]) Discharge Diagnoses: Principal Problem:   MDD (major depressive disorder), severe ([place])  Total Time spent with patient: 1 hour  Musculoskeletal: Strength & Muscle Tone: within normal limits Gait & Station: unsteady Patient leans: N/A  Psychiatric Specialty Exam  Presentation  General Appearance:  Appropriate for Environment; Casual; Fairly Groomed  Eye Contact: Good  Speech: Clear and Coherent; Normal Rate  Speech Volume: Normal  Handedness: Right  Mood and Affect  Mood: Anxious; Depressed  Duration of Depression Symptoms: No data recorded Affect: Appropriate; Congruent   Thought Process  Thought Processes: Coherent  Descriptions of Associations:Intact  Orientation:Full (Time, Place and Person)  Thought Content:Logical  History of Schizophrenia/Schizoaffective disorder:No data recorded Duration of Psychotic Symptoms:No data recorded Hallucinations:Hallucinations: None  Ideas of Reference:None  Suicidal Thoughts:Suicidal Thoughts: No SI Passive Intent and/or Plan: -- (n/a)  Homicidal Thoughts:Homicidal Thoughts: No  Sensorium  Memory: Immediate Good; Recent Good  Judgment: Good  Insight: Good  Executive Functions  Concentration: Good  Attention Span: Good  Recall: Good  Fund of Knowledge: Good  Language: Good  Psychomotor Activity  Psychomotor Activity: Psychomotor Activity: Normal  Assets  Assets: Communication Skills; Desire for Improvement; Physical Health; Resilience  Sleep  Sleep: Sleep: Good Number of Hours of Sleep: 7  Physical Exam: Physical Exam Vitals and nursing note reviewed.  HENT:     Head: Normocephalic.     Right Ear: External ear normal.     Left Ear: External ear normal.     Nose: Nose normal.     Mouth/Throat:     Mouth: Mucous membranes  are moist.     Pharynx: Oropharynx is clear.  Eyes:     Conjunctiva/sclera: Conjunctivae normal.     Pupils: Pupils are equal, round, and reactive to light.  Cardiovascular:     Rate and Rhythm: Normal rate.     Pulses: Normal pulses.  Pulmonary:     Effort: Pulmonary effort is normal.  Abdominal:     Palpations: Abdomen is soft.  Genitourinary:    Comments: Deferred Musculoskeletal:        General: Normal range of motion.     Cervical back: Normal range of motion.     Comments: Patient unsteady with gait.  She is to ambulate with rolling walker at all times  Skin:    General: Skin is warm.  Neurological:     General: No focal deficit present.     Mental Status: She is alert and oriented to person, place, and time.  Psychiatric:        Mood and Affect: Mood normal.        Behavior: Behavior normal.    Review of Systems  Constitutional: Negative.   HENT: Negative.    Eyes: Negative.   Respiratory: Negative.    Cardiovascular: Negative.   Gastrointestinal: Negative.   Genitourinary: Negative.   Musculoskeletal:  Positive for joint pain and myalgias.       Unsteady gait.  Patient to ambulate with rolling walker at all times.  Skin: Negative.   Neurological: Negative.   Endo/Heme/Allergies: Negative.   Psychiatric/Behavioral:  Positive for depression (Stable with medication), substance abuse and suicidal ideas (Patient denies). The patient has insomnia (Improved with medication).    Blood pressure (!) 107/90, pulse 67, temperature 98.5 F (36.9 C), temperature source Oral, resp. rate 18, height '5\' 1"'$  (1.549 m), weight 42.2 kg,  SpO2 100 %. Body mass index is 17.57 kg/m.  Mental Status Per Nursing Assessment::   On Admission:  NA  Demographic Factors:  Low socioeconomic status, Living alone, and Unemployed  Loss Factors: Decline in physical health and Financial problems/change in socioeconomic status  Historical Factors: NA  Risk Reduction Factors:   Sense of  responsibility to family, Positive therapeutic relationship, and Positive coping skills or problem solving skills  Continued Clinical Symptoms:  Depression:   Recent sense of peace/wellbeing Alcohol/Substance Abuse/Dependencies Chronic Pain More than one psychiatric diagnosis Previous Psychiatric Diagnoses and Treatments Medical Diagnoses and Treatments/Surgeries  Cognitive Features That Contribute To Risk:  Polarized thinking    Suicide Risk:  Minimal: No identifiable suicidal ideation.  Patients presenting with no risk factors but with morbid ruminations; may be classified as minimal risk based on the severity of the depressive symptoms   Follow-up [place]. Go to.   Specialty: Behavioral Health Why: Please go to this provider for an assessment, to obtain therapy and medication management services on Monday through Friday, arrive no later than 7:20 am as services are first come, first served. Contact information: [place] [phone]                Plan Of Care/Follow-up recommendations:  Discharge Recommendations:  The patient is being discharged with her family. Patient is to take her discharge medications as ordered.  See follow up above. We recommend that she participate in individual therapy to target uncontrollable agitation and substance abuse.  We recommend that she participate in family therapy to target the conflict with her family, to improve communication skills and conflict resolution skills.  Patient is to initiate/implement a contingency based behavioral model to address patient's behavior. We recommend that she get AIMS scale, height, weight, blood pressure, fasting lipid panel, fasting blood sugar in three months from discharge as she's on atypical antipsychotics.  Patient will benefit from monitoring of recurrent suicidal ideation since patient is on antidepressant  medication. The patient should abstain from all illicit substances and alcohol.  If the patient's symptoms worsen or do not continue to improve or if the patient becomes actively suicidal or homicidal then it is recommended that the patient return to the closest hospital emergency room or call 911 for further evaluation and treatment. National Suicide Prevention Lifeline 1800-SUICIDE or (705)224-0816. Please follow up with your primary medical doctor for all other medical needs.  The patient has been educated on the possible side effects to medications and she is to contact a medical professional and inform outpatient provider of any new side effects of medication. She is to take regular diet and activity as tolerated.  Will benefit from moderate daily exercise. Patient was educated about removing/locking any firearms, medications or dangerous products from the home.  Activity:  As tolerated.  Patient to ambulate with rolling walker with all ambulation's Diet:  Regular diet low-sodium  Laretta Bolster, FNP 08/03/2022, 10:52 AM

## 2022-08-10 ENCOUNTER — Other Ambulatory Visit: Payer: Self-pay | Admitting: Family Medicine

## 2022-08-10 DIAGNOSIS — M810 Age-related osteoporosis without current pathological fracture: Secondary | ICD-10-CM

## 2022-08-15 NOTE — Progress Notes (Deleted)
    SUBJECTIVE:   CHIEF COMPLAINT / HPI:  No chief complaint on file.   Patient was admitted from 1/4 to 1/10 for worsening depression and suicidal ideation.  No medications were changed, she was continued on sertraline 50 mg.  PERTINENT  PMH / PSH: ***  Patient Care Team: Zola Button, MD as PCP - General (Family Medicine)   OBJECTIVE:   There were no vitals taken for this visit.  Physical Exam      07/08/2022    3:27 PM  Depression screen PHQ 2/9  Decreased Interest 0  Down, Depressed, Hopeless 0  PHQ - 2 Score 0  Altered sleeping 1  Tired, decreased energy 1  Change in appetite 0  Feeling bad or failure about yourself  0  Trouble concentrating 0  Moving slowly or fidgety/restless 0  Suicidal thoughts 0  PHQ-9 Score 2  Difficult doing work/chores Somewhat difficult     {Show previous vital signs (optional):23777}  {Labs  Heme  Chem  Endocrine  Serology  Results Review (optional):23779}  ASSESSMENT/PLAN:   No problem-specific Assessment & Plan notes found for this encounter.    No follow-ups on file.   Zola Button, MD [place]

## 2022-08-15 NOTE — Patient Instructions (Incomplete)
It was nice seeing you today!  Blood work today.  See me in 3 months or whenever is a good for you.  Stay well, Mandy Fitzwater, MD [facility] [phone]  --  Make sure to check out at the front desk before you leave today.  Please arrive at least 15 minutes prior to your scheduled appointments.  If you had blood work today, I will send you a MyChart message or a letter if results are normal. Otherwise, I will give you a call.  If you had a referral placed, they will call you to set up an appointment. Please give us a call if you don't hear back in the next 2 weeks.  If you need additional refills before your next appointment, please call your pharmacy first.  

## 2022-08-18 ENCOUNTER — Ambulatory Visit: Payer: Medicaid Other

## 2022-08-19 ENCOUNTER — Inpatient Hospital Stay: Payer: Medicaid Other | Admitting: Family Medicine

## 2022-08-22 NOTE — Patient Instructions (Incomplete)
It was nice seeing you today!  I would recommend getting back to Behavioral Health to get established with a therapist.  Stay well, Littie Deeds, MD [facility] [phone]  --  Make sure to check out at the front desk before you leave today.  Please arrive at least 15 minutes prior to your scheduled appointments.  If you had blood work today, I will send you a MyChart message or a letter if results are normal. Otherwise, I will give you a call.  If you had a referral placed, they will call you to set up an appointment. Please give [place] a call if you don't hear back in the next 2 weeks.  If you need additional refills before your next appointment, please call your pharmacy first.  --  Therapy and Counseling Resources Most providers on this list will take Medicaid. Patients with commercial insurance or Medicare should contact their insurance company to get a list of in network providers.  [facility] [employer] (takes children) Location 1: [address] [place], [place] [address] Location 2: [address] [place], [place] [address] [phone]   [facility] (spanish speaking therapist available)(habla espanol)(take medicare and medicaid)  [address], [place], [place] [address], [email or web address] [email or web address] [phone]  [facility]iatry and Counseling [address] [place], [place] [address] [phone]  [facility]   [address] [place], [place] [address]      [phone]  [facility] (spanish available) [address]  [place], [place] [address] [phone]  [facility] (take [facility] and medicare) [address], [address]  [place], [place] [address]       [phone]     [facility] (virtual only) [phone]  [facility]ov[facility]ussma[facility] [address] [place], [place] [phone]  [facility]:  [address]  [place] [phone]  [facility]:  [address] [place] [phone]  [facility] (under & uninsured) [address]   [place] [phone]    [email or web address]     [facility]al Health [address]  [place]    [phone]  [facility] -[address]     Phone:  [phone]     [facility]-  [address]  [phone]   [facility] [address]      [place], [place] [phone]  [facility]: [address] [place], [place], [place]  [phone]   [facility] (Spanish therapist) [email or web address]  [address] [place]  [address]   [place] [address]    [phone]    [facility]   [address],  [place], [place]  [phone]   [facility]  [address] [place]  [phone]  [facility]  [address] [place], [place]        [phone]  Open Access/Walk In Clinic under & uninsured  [facility]  [address] [place], [place] [phone] Crisis [phone]  [facility] [address],  (Spanish)   [address] [place], [place]: [phone]) 8:30 - 12; 1 - 2:30  [facility] [employer],  [address] [place], [place]    [phone]):8:30 - 12; 2 - 3PM  [facility] [employer],  [address],  [place]  [place]; [phone]):   Mon - Fri 8 AM - 5 PM  [facility] [address] [place]  MWF 12:30 to 3:00 or call to schedule an appointment  [phone]  Specific Provider options Psychology Today  [email or web address] click on find a therapist  enter your zip code left side and select or tailor a therapist for your specific need.   [facility] Provider Directory [email or web address]  (Medicaid)    Follow all drop down to find a provider  Social Support program Mental Health [place] [phone] or PhotoSolver.pl [address], [place], [place] Recovery support and educational   24- Hour Availability:   [facility]  [address] [place], [place] [phone] Crisis [phone]  [facility] the [facility] [phone]  [place] [facility]  [phone]   [facility]  [phone] (after hours)  Therapeutic Alternative/Mobile Crisis   [phone]  [place] National Suicide Hotline  [phone] Len Childs)  Call 911 or go to emergency room  [facility]  [phone]);  [place] and [employer]  [phone]); [place], [place], [place], [place], [place], [place], [place]

## 2022-08-22 NOTE — Progress Notes (Unsigned)
    SUBJECTIVE:   CHIEF COMPLAINT / HPI:  No chief complaint on file.   Patient was admitted from 1/4 to 1/10 at [facility] for worsening depression and suicidal ideation. No medications were changed, she was continued on sertraline 50 mg.   PERTINENT  PMH / PSH: ***  Patient Care Team: Zola Button, MD as PCP - General (Family Medicine)   OBJECTIVE:   There were no vitals taken for this visit.  Physical Exam      07/08/2022    3:27 PM  Depression screen PHQ 2/9  Decreased Interest 0  Down, Depressed, Hopeless 0  PHQ - 2 Score 0  Altered sleeping 1  Tired, decreased energy 1  Change in appetite 0  Feeling bad or failure about yourself  0  Trouble concentrating 0  Moving slowly or fidgety/restless 0  Suicidal thoughts 0  PHQ-9 Score 2  Difficult doing work/chores Somewhat difficult     {Show previous vital signs (optional):23777}  {Labs  Heme  Chem  Endocrine  Serology  Results Review (optional):23779}  ASSESSMENT/PLAN:   No problem-specific Assessment & Plan notes found for this encounter.    No follow-ups on file.   Zola Button, MD [place]

## 2022-08-23 ENCOUNTER — Encounter: Payer: Self-pay | Admitting: Family Medicine

## 2022-08-23 ENCOUNTER — Telehealth: Payer: Self-pay

## 2022-08-23 ENCOUNTER — Ambulatory Visit (INDEPENDENT_AMBULATORY_CARE_PROVIDER_SITE_OTHER): Payer: Medicaid Other | Admitting: Family Medicine

## 2022-08-23 VITALS — BP 98/58 | HR 57 | Wt 92.0 lb

## 2022-08-23 DIAGNOSIS — Z09 Encounter for follow-up examination after completed treatment for conditions other than malignant neoplasm: Secondary | ICD-10-CM

## 2022-08-23 DIAGNOSIS — F331 Major depressive disorder, recurrent, moderate: Secondary | ICD-10-CM

## 2022-08-23 DIAGNOSIS — Z23 Encounter for immunization: Secondary | ICD-10-CM

## 2022-08-23 DIAGNOSIS — F17200 Nicotine dependence, unspecified, uncomplicated: Secondary | ICD-10-CM

## 2022-08-23 MED ORDER — NICOTINE POLACRILEX 2 MG MT GUM: 2.0000 mg | CHEWING_GUM | OROMUCOSAL | 6 refills | Status: DC | PRN

## 2022-08-23 NOTE — Telephone Encounter (Signed)
Received call from pharmacy regarding prescription for nicotine gum. She is needing further clarification on how many times patient is using per day.   She is requesting new prescription with this information included. She is also asking if provider would like patient to be on nicotine patches for maintenance and then use the gum for urgency cravings.   Please advise.   Talbot Grumbling, RN

## 2022-08-23 NOTE — Assessment & Plan Note (Signed)
Quit smoking as of this month.  Congratulated patient.  She will continue nicotine gum.

## 2022-08-23 NOTE — Telephone Encounter (Signed)
I sent in a refill for patient at home initially did not need any more refills.  She can take the nicotine gum multiple times a day as needed for cravings.  She is doing well on nicotine gum alone and I do not think she needs nicotine patch.

## 2022-08-23 NOTE — Assessment & Plan Note (Addendum)
Doing well after hospitalization.  She will continue sertraline and establish with a therapist.  Resources provided in case she needs them.

## 2022-08-24 MED ORDER — NICOTINE POLACRILEX 2 MG MT GUM: 2.0000 mg | CHEWING_GUM | OROMUCOSAL | 6 refills | Status: AC | PRN

## 2022-08-24 NOTE — Telephone Encounter (Signed)
Returned call to pharmacy. Per insurance guidelines, prescription needs to have specific direction and max daily quantity.   Also, box comes in quantity of 110.  Please send in new prescription with this documentation.   Thanks.  Talbot Grumbling, RN

## 2022-09-16 ENCOUNTER — Other Ambulatory Visit: Payer: Self-pay

## 2022-09-16 MED ORDER — SERTRALINE HCL 50 MG PO TABS: 50.0000 mg | ORAL_TABLET | Freq: Every day | ORAL | 0 refills | Status: DC

## 2022-09-16 MED ORDER — TRAZODONE HCL 50 MG PO TABS: 50.0000 mg | ORAL_TABLET | Freq: Every evening | ORAL | 0 refills | Status: DC | PRN

## 2022-09-16 MED ORDER — HYDROXYZINE HCL 25 MG PO TABS: 25.0000 mg | ORAL_TABLET | Freq: Three times a day (TID) | ORAL | 0 refills | Status: DC | PRN

## 2022-10-05 ENCOUNTER — Other Ambulatory Visit: Payer: Self-pay | Admitting: Family Medicine

## 2022-10-12 ENCOUNTER — Ambulatory Visit: Payer: Medicaid Other | Admitting: Dermatology

## 2022-10-28 ENCOUNTER — Ambulatory Visit: Payer: Medicaid Other | Admitting: Family Medicine

## 2022-10-31 ENCOUNTER — Telehealth: Payer: Self-pay

## 2022-10-31 NOTE — Telephone Encounter (Signed)
Patient calls nurse line requesting a living accomodation note.   She reports she can not live by herself and therefore her daughter lives with her.   She reports she is having complications with her apartment complex in regards to this. She reports if PCP writes a note stating she can not live by herself due to health reasons the complex will allow her daughter to stay.   Patient has an apt with PCP on 4/26.  Will forward to PCP.

## 2022-10-31 NOTE — Telephone Encounter (Signed)
I would like to see her for her scheduled appointment to discuss further before writing the note.

## 2022-11-01 NOTE — Telephone Encounter (Signed)
Attempted to call patient to provide with update. Patient did not answer and I was unable to LVM.   Will attempt to contact patient at later time.   Veronda Prude, RN

## 2022-11-02 ENCOUNTER — Other Ambulatory Visit: Payer: Self-pay | Admitting: Family Medicine

## 2022-11-11 ENCOUNTER — Other Ambulatory Visit: Payer: Self-pay | Admitting: Surgical

## 2022-11-18 ENCOUNTER — Ambulatory Visit: Payer: Medicaid Other | Admitting: Family Medicine

## 2022-11-23 ENCOUNTER — Ambulatory Visit: Payer: Medicaid Other | Admitting: Sports Medicine

## 2022-11-29 ENCOUNTER — Ambulatory Visit: Payer: Medicaid Other | Admitting: Family Medicine

## 2022-11-29 NOTE — Progress Notes (Deleted)
    SUBJECTIVE:   CHIEF COMPLAINT / HPI:  No chief complaint on file.   ***  PERTINENT  PMH / PSH: Depression, psoriasis  Patient Care Team: Littie Deeds, MD as PCP - General (Family Medicine)   OBJECTIVE:   There were no vitals taken for this visit.  Physical Exam      08/23/2022    9:38 AM  Depression screen PHQ 2/9  Decreased Interest 1  Down, Depressed, Hopeless 0  PHQ - 2 Score 1  Altered sleeping 0  Tired, decreased energy 2  Change in appetite 0  Feeling bad or failure about yourself  0  Trouble concentrating 0  Moving slowly or fidgety/restless 0  Suicidal thoughts 0  PHQ-9 Score 3     {Show previous vital signs (optional):23777}  {Labs  Heme  Chem  Endocrine  Serology  Results Review (optional):23779}  ASSESSMENT/PLAN:   No problem-specific Assessment & Plan notes found for this encounter.    No follow-ups on file.   Littie Deeds, MD [facility]

## 2022-11-30 ENCOUNTER — Other Ambulatory Visit: Payer: Self-pay | Admitting: Family Medicine

## 2022-11-30 ENCOUNTER — Ambulatory Visit: Payer: Medicaid Other | Admitting: Sports Medicine

## 2022-12-02 ENCOUNTER — Other Ambulatory Visit: Payer: Self-pay | Admitting: Family Medicine

## 2022-12-05 ENCOUNTER — Ambulatory Visit (INDEPENDENT_AMBULATORY_CARE_PROVIDER_SITE_OTHER): Payer: Medicaid Other | Admitting: Family Medicine

## 2022-12-05 ENCOUNTER — Other Ambulatory Visit: Payer: Self-pay

## 2022-12-05 ENCOUNTER — Telehealth: Payer: Self-pay | Admitting: Orthopedic Surgery

## 2022-12-05 VITALS — BP 100/70 | Ht 61.0 in | Wt 93.0 lb

## 2022-12-05 DIAGNOSIS — M79641 Pain in right hand: Secondary | ICD-10-CM

## 2022-12-05 DIAGNOSIS — M654 Radial styloid tenosynovitis [de Quervain]: Secondary | ICD-10-CM | POA: Diagnosis not present

## 2022-12-05 DIAGNOSIS — M79642 Pain in left hand: Secondary | ICD-10-CM

## 2022-12-05 MED ORDER — PREDNISONE 10 MG PO TABS: ORAL_TABLET | ORAL | 0 refills | Status: DC

## 2022-12-05 NOTE — Telephone Encounter (Signed)
Patient needs a sling right shoulder and wants to pick up today if possible . Please call and advise

## 2022-12-05 NOTE — Progress Notes (Signed)
PCP: Littie Deeds, MD  Subjective:   HPI: Patient is a 52 y.o. female here for bilateral hand pain.  Patient reports several months of bilateral hand pain. No acute injury or trauma. Started to notice right 3rd finger locking in flexion especially when waking up similar to prior trigger finger that benefited from injection. Symptoms more diffuse now - triggering feeling of left 3rd digit. Tingling into all digits of both hands. Cannot use hands due to pain. Not tried any medications or bracing for this.  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 4 tablet 4   Apremilast (OTEZLA) 30 MG TABS Take 1 tablet (30 mg total) by mouth 2 (two) times daily. 28 tablet 12   Ferrous Sulfate 27 MG TABS Take 1 tablet by mouth daily.     hydrOXYzine (ATARAX) 25 MG tablet TAKE 1 Tablet BY MOUTH THREE TIMES DAILY AS NEEDED FOR ANXIETY 30 tablet 2   ketorolac (ACULAR) 0.4 % SOLN Place 1 drop into the left eye daily as needed (pain).     methocarbamol (ROBAXIN) 500 MG tablet Take 1 tablet (500 mg total) by mouth every 8 (eight) hours as needed for muscle spasms. 30 tablet 1   Multiple Minerals-Vitamins (CALCIUM & VIT D3 BONE HEALTH PO) Take 1 tablet by mouth daily. Calcium carbonate 600 mg + cholecalciferol 800 IU     Multiple Vitamins-Minerals (MULTIVITAMIN GUMMIES WOMENS) CHEW Chew 1 tablet by  mouth daily.     nicotine polacrilex (RA NICOTINE GUM) 2 MG gum Take 1 each (2 mg total) by mouth every hour as needed for smoking cessation. No more than 24 pieces per day. 110 each 6   omeprazole (PRILOSEC) 20 MG capsule TAKE ONE CAPSULE BY MOUTH EVERY DAY 30 capsule PRN   sertraline (ZOLOFT) 50 MG tablet TAKE 1 Tablet BY MOUTH ONCE DAILY 90 tablet 0   traZODone (DESYREL) 50 MG tablet TAKE 1 Tablet BY MOUTH ONCE DAILY at bedtime AS NEEDED FOR SLEEP 30 tablet 0   No current facility-administered medications on file prior to visit.    Past Surgical History:  Procedure Laterality Date   CESAREAN SECTION     3 previous c sections   EYE SURGERY Bilateral    MUSCLE REPAIR Right 01/10/2017   RIGHT PECTORAL MAJOR TO SCAPULA MUSCLE TRANSFER (Right   NOVASURE ABLATION N/A 03/11/2014   Procedure: NOVASURE ABLATION;  Surgeon: Allie Bossier, MD;  Location: [facility] ORS;  Service: Gynecology;  Laterality: N/A;   PECTORALIS TENDON REPAIR Right 01/10/2017   Procedure: RIGHT PECTORAL MAJOR TO SCAPULA MUSCLE TRANSFER;  Surgeon: Cammy Copa, MD;  [facility];  Service: Orthopedics;  Laterality: Right;   PECTORALIS TENDON REPAIR Right 12/28/2017   Procedure: RIGHT REVISION TENDON TRANSFER OF PECTORALIS MAJOR;  Surgeon: Cammy Copa, MD;  Location: [facility] OR;  Service: Orthopedics;  Laterality: Right;   rotator cuff surgery     SHOULDER SURGERY     TUBAL LIGATION      Allergies  Allergen Reactions   Percocet [Oxycodone-Acetaminophen] Itching   Robaxin [Methocarbamol] Rash    Denies current allergy    BP 100/70   Ht 5\' 1"  (1.549 m)   Wt 93 lb (42.2 kg)   BMI 17.57 kg/m       No data to display              No data to display              Objective:  Physical Exam:  Gen: NAD, comfortable in exam room  Bilateral hands/wrists: No deformity, swelling, bruising, atrophy. FROM with 5/5 strength finger abduction, extension, thumb opposition.  Pain with thumb motions right >  left Tenderness to palpation diffusely including carpal tunnel, 1st dorsal compartment, 1st CMCs, over A1 pulley 3rd digits. NVI distally. Negative tinels; cannot hold phalen's position due to pain in wrists.   Limited MSK u/s bilateral hands/wrists: R median nerve volume 0.09cm2, L median nerve 0.08cm2.  Moderate right 1st dorsal compartment tenosynovitis.  Normal left 1st dorsal compartment.  Mild arthropathy bilaterally of 1st CMCs without effusion.  No thickening of A1 pulleys 3rd digits or catching on dynamic ultrasound.  Assessment & Plan:  1. Bilateral wrist pain - due to multiple issues.  Symptoms indicative of carpal tunnel as a contributor but no swelling of median nerves and would expect this with length of her symptoms.  Clear dequervain's tenosynovitis on right, symptoms and nodule palpated over A1 pulleys 3rd digits bilaterally consistent with trigger fingers of 3rd digits.

## 2022-12-05 NOTE — Patient Instructions (Signed)
You have dequervain's tenosynovitis of your right wrist and trigger fingers. Try the aluminum splints and the thumb spica wrist brace - definitely wear these at bedtime. Icing 15 minutes at a time 3-4 times a day. Prednisone dose pack x 6 days as directed - don't take aleve or ibuprofen while on this. Follow up with me in 1-2 weeks for reevaluation.

## 2022-12-05 NOTE — Telephone Encounter (Signed)
IC and discussed with patient. She stated that she actually does not need this at the time but will call us back if she does. She is aware she will need ROV since Dr August Saucer has not seen her in over 1 yr.

## 2022-12-07 ENCOUNTER — Ambulatory Visit: Payer: Medicaid Other | Admitting: Sports Medicine

## 2022-12-08 ENCOUNTER — Ambulatory Visit: Payer: Medicaid Other | Admitting: Dermatology

## 2022-12-14 ENCOUNTER — Ambulatory Visit: Payer: Medicaid Other | Admitting: Family Medicine

## 2022-12-20 ENCOUNTER — Other Ambulatory Visit: Payer: Self-pay | Admitting: Family Medicine

## 2022-12-21 ENCOUNTER — Telehealth: Payer: Self-pay

## 2022-12-21 NOTE — Telephone Encounter (Signed)
I have never filled this medication for her.  It appears this was previously prescribed by her orthopedic provider.  She will need to obtain his prescription from them, if appropriate.

## 2022-12-21 NOTE — Telephone Encounter (Signed)
Patient calls nurse line requesting a refill on Tramadol.   This is not on her current medication list.   Patient reports this is an "everyday medication for her."   Will forward to PCP.

## 2022-12-22 NOTE — Telephone Encounter (Signed)
Patient returns call to nurse line regarding request.   Advised of message per Dr. Wynelle Link.   Veronda Prude, RN

## 2022-12-26 ENCOUNTER — Other Ambulatory Visit: Payer: Self-pay | Admitting: Family Medicine

## 2022-12-26 ENCOUNTER — Ambulatory Visit (INDEPENDENT_AMBULATORY_CARE_PROVIDER_SITE_OTHER): Payer: Medicaid Other | Admitting: Family Medicine

## 2022-12-26 VITALS — BP 108/84 | Ht 61.0 in | Wt 100.0 lb

## 2022-12-26 DIAGNOSIS — M79642 Pain in left hand: Secondary | ICD-10-CM

## 2022-12-26 DIAGNOSIS — M79641 Pain in right hand: Secondary | ICD-10-CM

## 2022-12-26 DIAGNOSIS — M810 Age-related osteoporosis without current pathological fracture: Secondary | ICD-10-CM

## 2022-12-26 MED ORDER — GABAPENTIN 300 MG PO CAPS
300.0000 mg | ORAL_CAPSULE | Freq: Three times a day (TID) | ORAL | 1 refills | Status: DC
Start: 2022-12-26 — End: 2023-02-22

## 2022-12-26 NOTE — Patient Instructions (Addendum)
We will go ahead with a neurology referral for nerve conduction studies of your arms.  [facility] [address], [place], [place] [address] Phone: [phone]  Start gabapentin 1 capsule at bedtime - if this doesn't make you too sleepy you can increase to 1 capsule twice a day and finally 1 capsule three times a day. Continue the thumb brace. Band-aid splinting for your trigger finger. We will call you with the results of your nerve test when we receive them.

## 2022-12-26 NOTE — Progress Notes (Signed)
PCP: Littie Deeds, MD  Subjective:   HPI: Patient is a 52 y.o. female here for follow-up of wrist/hand pain.  She was seen in the [facility] on 5/13 with concern for carpal tunnel, de Quervain's tenosynovitis, trigger finger of the third digit.  Trialed thumb spica brace on right hand, small finger splinting of third digits, and 6-day course of prednisone.  She reports compliance with all of the above.  She has still had persistent pain in her right hand and some in her left hand as well.  Pain is typically burning in nature or sharp.  Reports about a 7 out of 10 pain, difficulty with mobility.  Needs splinting of the third digit.  Of note, she previously used gabapentin which seemed to help with the pain, but she is high fall risk.  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 4 tablet 4   Apremilast (OTEZLA) 30 MG TABS Take 1 tablet (30 mg total) by mouth 2 (two) times daily. 28 tablet 12   Ferrous Sulfate 27 MG TABS Take 1 tablet by mouth daily.     hydrOXYzine (ATARAX) 25 MG tablet TAKE 1 Tablet BY MOUTH THREE TIMES DAILY AS NEEDED FOR ANXIETY 30 tablet 2   ketorolac (ACULAR) 0.4 % SOLN Place 1 drop into the left eye daily as needed (pain).     methocarbamol (ROBAXIN) 500 MG tablet Take 1 tablet (500 mg total) by mouth every 8 (eight) hours as needed for muscle spasms. 30 tablet 1    Multiple Minerals-Vitamins (CALCIUM & VIT D3 BONE HEALTH PO) Take 1 tablet by mouth daily. Calcium carbonate 600 mg + cholecalciferol 800 IU     Multiple Vitamins-Minerals (MULTIVITAMIN GUMMIES WOMENS) CHEW Chew 1 tablet by mouth daily.     nicotine polacrilex (RA NICOTINE GUM) 2 MG gum Take 1 each (2 mg total) by mouth every hour as needed for smoking cessation. No more than 24 pieces per day. 110 each 6   omeprazole (PRILOSEC) 20 MG capsule TAKE ONE CAPSULE BY MOUTH EVERY DAY 30 capsule PRN   predniSONE (DELTASONE) 10 MG tablet 6 tabs po day 1, 5 tabs po day 2, 4 tabs po day 3, 3 tabs po day 4, 2 tabs po day 5, 1 tab po day 6 21 tablet 0   sertraline (ZOLOFT) 50 MG tablet TAKE 1 Tablet BY MOUTH ONCE DAILY 90 tablet 0   traZODone (DESYREL) 50 MG tablet TAKE 1 Tablet BY MOUTH ONCE DAILY at bedtime AS NEEDED FOR SLEEP 30 tablet 0   No current facility-administered medications on file prior to visit.    Past Surgical History:  Procedure Laterality Date   CESAREAN SECTION     3 previous c sections   EYE SURGERY Bilateral    MUSCLE REPAIR Right 01/10/2017   RIGHT  PECTORAL MAJOR TO SCAPULA MUSCLE TRANSFER (Right   NOVASURE ABLATION N/A 03/11/2014   Procedure: NOVASURE ABLATION;  Surgeon: Allie Bossier, MD;  Location: [facility];  Service: Gynecology;  Laterality: N/A;   PECTORALIS TENDON REPAIR Right 01/10/2017   Procedure: RIGHT PECTORAL MAJOR TO SCAPULA MUSCLE TRANSFER;  Surgeon: Cammy Copa, MD;  Location: [facility] OR;  Service: Orthopedics;  Laterality: Right;   PECTORALIS TENDON REPAIR Right 12/28/2017   Procedure: RIGHT REVISION TENDON TRANSFER OF PECTORALIS MAJOR;  Surgeon: Cammy Copa, MD;  Location: [facility] OR;  Service: Orthopedics;  Laterality: Right;   rotator cuff surgery     SHOULDER SURGERY     TUBAL LIGATION      Allergies  Allergen Reactions   Percocet [Oxycodone-Acetaminophen] Itching   Robaxin [Methocarbamol] Rash    Denies current allergy    BP 108/84   Ht 5\' 1"   (1.549 m)   Wt 100 lb (45.4 kg)   BMI 18.89 kg/m       No data to display              No data to display              Objective:  Physical Exam:  Gen: NAD, comfortable in exam room Neck: No radicular pain with neck movements Bilateral hands: Atrophy of thenar> hypothenar eminence bilaterally worse in right hand.  No bruising or clear bony deformities.  Limited AROM due to pain, full PROM.   4/5 grip strength, worse in right compared to left hand.  Tenderness with palpation of her carpal tunnel, proximal thumb, thenar eminence, over A1 pulley third digit, 1st dorsal compartment.  Tenderness to palpation with Tinel's, but not true positive Tinel's.  Cannot hold Phalen's position.  Positive Finkelstein test.  Assessment & Plan:  1.  Bilateral wrist/hand pain.  Likely multifactorial due to some combination of carpal tunnel versus other peripheral neuropathy, de Quervain's, trigger finger, and also possible shoulder cervical nerve impingement given her history of rotator cuff surgeries, though no clear radicular pain with neck rotation, flexion, extension. - Trial gabapentin 300 nightly, can titrate up to 3 times daily.  Hesitant to increase dosing above that given her fall risk. - Nerve conduction study to confirm or rule out carpal tunnel - Continue bracing, third digit splinting

## 2022-12-28 ENCOUNTER — Other Ambulatory Visit: Payer: Self-pay | Admitting: Family Medicine

## 2023-01-11 ENCOUNTER — Other Ambulatory Visit: Payer: Self-pay

## 2023-01-11 DIAGNOSIS — M79641 Pain in right hand: Secondary | ICD-10-CM

## 2023-01-12 ENCOUNTER — Other Ambulatory Visit: Payer: Self-pay

## 2023-01-12 DIAGNOSIS — M79641 Pain in right hand: Secondary | ICD-10-CM

## 2023-01-12 MED ORDER — PREDNISONE 10 MG PO TABS: ORAL_TABLET | ORAL | 0 refills | Status: DC

## 2023-01-12 NOTE — Progress Notes (Signed)
Pt unable to be scheduled with [facility]. New referral placed.

## 2023-01-13 ENCOUNTER — Telehealth: Payer: Self-pay | Admitting: Family Medicine

## 2023-01-13 NOTE — Telephone Encounter (Signed)
-----   Message from [facility], LAT sent at 01/12/2023  9:30 AM EDT ----- Regarding: RE: please call pt She is good with trying prednisone again. I sent that in for her. Thanks!  ----- Message ----- From: Lenda Kelp, MD Sent: 01/11/2023   5:05 PM EDT To: Rutha Bouchard, LAT Subject: RE: please call pt                             We could try prednisone again but that didn't seem to help her that much.  ----- Message ----- From: Rutha Bouchard, LAT Sent: 01/11/2023   4:38 PM EDT To: Lenda Kelp, MD Subject: RE: please call pt                             She states she gets hives with NSAIDs so unable to take diclofenac. Anything else to try? ----- Message ----- From: Lenda Kelp, MD Sent: 01/11/2023   4:18 PM EDT To: Rutha Bouchard, LAT Subject: RE: please call pt                             Is she able to take aleve/ibuprofen products?  If no history of ulcer we could try diclofenac 75mg  twice a day with food.  Last kidney function was good.  ----- Message ----- From: Rutha Bouchard, LAT Sent: 01/11/2023   8:51 AM EDT To: Lenda Kelp, MD Subject: FW: please call pt                             Apparently she hasn't gotten a call from neuro yet, I resent the referral and sent to an additional location as well to try and get her seen. Anything additional to offer her at the moment? ----- Message ----- From: Lizbeth Bark Sent: 01/11/2023   8:34 AM EDT To: Rutha Bouchard, LAT Subject: please call pt                                 Pt called stating that gabapentin is not helping with pain only making her sleepy. Rt hand is getting worse. Pain is an 8 and can't use it at all. I gave her the phone number for neuro, she states they haven't called her yet.

## 2023-01-18 ENCOUNTER — Other Ambulatory Visit: Payer: Self-pay | Admitting: Family Medicine

## 2023-01-25 ENCOUNTER — Ambulatory Visit (INDEPENDENT_AMBULATORY_CARE_PROVIDER_SITE_OTHER): Payer: Medicaid Other | Admitting: Orthopedic Surgery

## 2023-01-25 ENCOUNTER — Encounter: Payer: Self-pay | Admitting: Orthopedic Surgery

## 2023-01-25 DIAGNOSIS — M25531 Pain in right wrist: Secondary | ICD-10-CM | POA: Diagnosis not present

## 2023-01-25 NOTE — Progress Notes (Signed)
Office Visit Note   Patient: Annette Chang           Date of Birth: September 03, 1970           MRN: [id number] Visit Date: 01/25/2023 Requested by: Vonna Drafts, MD [address] [place],  [place] [address] PCP: Vonna Drafts, MD  Subjective: Chief Complaint  Patient presents with   Right Hand - Pain    HPI: Annette Chang is a 52 y.o. female who presents to the office reporting right hand pain.  Has had right hand and wrist pain for months.  Reports constant pain.  Also describes weakness and inability to functionally use that hand.  Patient has been deemed a fall risk.  Describes decreased function as well as numbness in the hand on the dorsal and palmar surface.  Denies much in the way of neck symptoms.  Does use a walker..                ROS: All systems reviewed are negative as they relate to the chief complaint within the history of present illness.  Patient denies fevers or chills.  Assessment & Plan: Visit Diagnoses:  1. Pain in right wrist     Plan: Impression is right wrist pain with unclear etiology.  Radiographs are pretty unremarkable in terms of arthritis.  I think with the numbness component she would benefit from nerve conduction study.  Could also get MRI scan of the right wrist to evaluate for any type of atypical problem.  We also gave her a letter today stating that the patient is a fall risk and has multiple orthopedic problems which affects her ability to ambulate with a walker on uneven terrain.  She may need some type of functional housing assistance in an optimal location to facilitate her not falling.  Follow-Up Instructions: No follow-ups on file.   Orders:  Orders Placed This Encounter  Procedures   MR Wrist Right w/o contrast   Ambulatory referral to Physical Medicine Rehab   No orders of the defined types were placed in this encounter.     Procedures: No procedures performed   Clinical Data: No additional findings.  Objective: Vital Signs:  There were no vitals taken for this visit.  Physical Exam:  Constitutional: Patient appears well-developed HEENT:  Head: Normocephalic Eyes:EOM are normal Neck: Normal range of motion Cardiovascular: Normal rate Pulmonary/chest: Effort normal Neurologic: Patient is alert Skin: Skin is warm Psychiatric: Patient has normal mood and affect  Ortho Exam: Ortho exam demonstrates decreased grip strength on the right versus left.  Also has paresthesias on the dorsal and palmar aspect of the hand with no Tinel's in the cubital tunnel and no unstable ulnar nerve.  Passive and active wrist flexion and extension strength and range of motion is symmetric to the left-hand side.  Not too much asymmetric swelling in the hand or wrist.  Positive carpal tunnel compression testing is present.  Radial pulses intact.  Specialty Comments:  No specialty comments available.  Imaging: No results found.   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   Chronic urticaria 08/06/2015   H/O rotator cuff surgery 01/02/2012   Arthritis of shoulder region, 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  Iron deficiency anemia 01/02/2012   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   Blood transfusion without reported diagnosis    Depression    H/O rotator cuff surgery 01/02/2012   H/O tubal ligation 01/02/2012   H/O: C-section 01/02/2012   3    H/O: C-section 01/02/2012   3    Headache(784.0) 01/02/2012   Iron deficiency anemia 01/02/2012   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   Suicidal ideation 07/27/2022    Family History  Problem Relation Age of Onset    Diabetes Maternal Grandmother    Diabetes Paternal Grandmother    Diabetes Paternal Grandfather    Colon cancer Neg Hx    Esophageal cancer Neg Hx    Rectal cancer Neg Hx    Stomach cancer Neg Hx     Past Surgical History:  Procedure Laterality Date   CESAREAN SECTION     3 previous c sections   EYE SURGERY Bilateral    MUSCLE REPAIR Right 01/10/2017   RIGHT PECTORAL MAJOR TO SCAPULA MUSCLE TRANSFER (Right   NOVASURE ABLATION N/A 03/11/2014   Procedure: NOVASURE ABLATION;  Surgeon: Allie Bossier, MD;  Location: [facility];  Service: Gynecology;  Laterality: N/A;   PECTORALIS TENDON REPAIR Right 01/10/2017   Procedure: RIGHT PECTORAL MAJOR TO SCAPULA MUSCLE TRANSFER;  Surgeon: Cammy Copa, MD;  Location: [facility] OR;  Service: Orthopedics;  Laterality: Right;   PECTORALIS TENDON REPAIR Right 12/28/2017   Procedure: RIGHT REVISION TENDON TRANSFER OF PECTORALIS MAJOR;  Surgeon: Cammy Copa, MD;  Location: [facility] OR;  Service: Orthopedics;  Laterality: Right;   rotator cuff surgery     SHOULDER SURGERY     TUBAL LIGATION     Social History   Occupational History   Occupation: unemployed  Tobacco Use   Smoking status: Former    Years: 5    Types: Cigarettes    Quit date: 11/2017    Years since quitting: 5.1    Passive exposure: Past   Smokeless tobacco: Never  Vaping Use   Vaping Use: Never used  Substance and Sexual Activity   Alcohol use: Yes    Alcohol/week: 18.0 standard drinks of alcohol    Types: 18 Cans of beer per week    Comment: three times a week drink 6 beers in one sitting   Drug use: Yes    Types: Cocaine, Marijuana    Comment: marijuana used 3 times a weekly   Sexual activity: Not Currently    Birth control/protection: Surgical

## 2023-01-31 ENCOUNTER — Other Ambulatory Visit: Payer: Self-pay | Admitting: Surgical

## 2023-01-31 ENCOUNTER — Ambulatory Visit: Payer: Medicaid Other | Admitting: Family Medicine

## 2023-01-31 NOTE — Progress Notes (Deleted)
  SUBJECTIVE:   CHIEF COMPLAINT / HPI:   Here for checkup.  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  General: NAD, pleasant, able to participate in exam Cardiac: RRR, no murmurs auscultated Respiratory: CTAB, normal WOB Abdomen: soft, non-tender, non-distended, normoactive bowel sounds Extremities: warm and well perfused, no edema or cyanosis Skin: warm and dry, no rashes noted Neuro: alert, no obvious focal deficits, speech normal Psych: Normal affect and mood  ASSESSMENT/PLAN:   There are no diagnoses linked to this encounter. No orders of the defined types were placed in this encounter.  No follow-ups on file.  Vonna Drafts, MD [facility]

## 2023-02-02 ENCOUNTER — Other Ambulatory Visit: Payer: Medicaid Other

## 2023-02-07 ENCOUNTER — Telehealth: Payer: Self-pay | Admitting: Physical Medicine and Rehabilitation

## 2023-02-07 ENCOUNTER — Encounter: Payer: Medicaid Other | Admitting: Physical Medicine and Rehabilitation

## 2023-02-07 NOTE — Telephone Encounter (Signed)
scheduled

## 2023-02-07 NOTE — Telephone Encounter (Signed)
Patient called needing to R/S her appointment due to not feeling well.  The number to contact patient is [phone]

## 2023-02-08 DIAGNOSIS — M25531 Pain in right wrist: Secondary | ICD-10-CM | POA: Diagnosis not present

## 2023-02-16 ENCOUNTER — Telehealth: Payer: Self-pay | Admitting: Orthopedic Surgery

## 2023-02-16 ENCOUNTER — Telehealth: Payer: Self-pay | Admitting: Physical Medicine and Rehabilitation

## 2023-02-16 NOTE — Telephone Encounter (Signed)
Patient called wanting Dr. August Saucer to know she can not use her hand at all nor bend her wrist. Patient was advised that her MRI has been approved and she can call and schedule her appointment. The number to contact patient is [phone]

## 2023-02-16 NOTE — Telephone Encounter (Signed)
Ok for [employer] of wrist thx

## 2023-02-16 NOTE — Telephone Encounter (Signed)
Patient called and wanted to reschedule CB#[phone]

## 2023-02-16 NOTE — Telephone Encounter (Signed)
Spoke with patient and she thought she had missed her appointment. Informed patient that appointment is on 02/17/23. She is coming for appointment

## 2023-02-17 ENCOUNTER — Telehealth: Payer: Self-pay | Admitting: Orthopedic Surgery

## 2023-02-17 ENCOUNTER — Ambulatory Visit (INDEPENDENT_AMBULATORY_CARE_PROVIDER_SITE_OTHER): Payer: Medicaid Other | Admitting: Physical Medicine and Rehabilitation

## 2023-02-17 ENCOUNTER — Encounter: Payer: Self-pay | Admitting: Physical Medicine and Rehabilitation

## 2023-02-17 DIAGNOSIS — R202 Paresthesia of skin: Secondary | ICD-10-CM

## 2023-02-17 DIAGNOSIS — G5601 Carpal tunnel syndrome, right upper limb: Secondary | ICD-10-CM | POA: Diagnosis not present

## 2023-02-17 DIAGNOSIS — M25531 Pain in right wrist: Secondary | ICD-10-CM

## 2023-02-17 DIAGNOSIS — M79641 Pain in right hand: Secondary | ICD-10-CM | POA: Diagnosis not present

## 2023-02-17 DIAGNOSIS — R531 Weakness: Secondary | ICD-10-CM | POA: Diagnosis not present

## 2023-02-17 NOTE — Telephone Encounter (Signed)
Pt came

## 2023-02-17 NOTE — Procedures (Signed)
EMG & NCV Findings: Evaluation of the right median (across palm) sensory nerve showed no response (Palm) and prolonged distal peak latency (4.3 ms).  All remaining nerves (as indicated in the following tables) were within normal limits.    All examined muscles (as indicated in the following table) showed no evidence of electrical instability.    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   There is no significant electrodiagnostic evidence of any other focal nerve entrapment, brachial plexopathy or cervical radiculopathy.  As you know, this particular electrodiagnostic study cannot rule out chemical radiculitis or sensory only radiculopathy.   **This electrodiagnostic study cannot rule out small fiber polyneuropathy and dysesthesias from central pain syndromes such as stroke or central pain sensitization syndromes such as fibromyalgia.  Myotomal referral pain from trigger points is also not excluded.  Recommendations: 1.  Follow-up with referring physician. MRI is pending. 2.  Continue current management of symptoms. 3.  She asked about bracing today and clinically her symptoms cannot be all due to the mild median neuropathy.  I did suggest that she talk to Dr. August Saucer and his staff about possible bracing.    ___________________________ Naaman Plummer [facility] Board Certified, American Board of Physical Medicine and Rehabilitation    Nerve Conduction Studies Anti Sensory Summary Table   Stim Site NR Peak (ms) Norm Peak (ms) P-T Amp (V) Norm P-T Amp Site1 Site2 Delta-P (ms) Dist (cm) Vel (m/s) Norm Vel (m/s)  Right Median Acr Palm Anti Sensory (2nd Digit)  28.7C  Wrist    *4.3 <3.6 63.6 >10 Wrist Palm  0.0    Palm *NR  <2.0          Right Radial Anti Sensory (Base 1st Digit)  28.2C  Wrist    2.3 <3.1 28.8  Wrist Base 1st Digit 2.3 0.0    Right Ulnar  Anti Sensory (5th Digit)  28.7C  Wrist    3.6 <3.7 34.5 >15.0 Wrist 5th Digit 3.6 14.0 39 >38   Motor Summary Table   Stim Site NR Onset (ms) Norm Onset (ms) O-P Amp (mV) Norm O-P Amp Site1 Site2 Delta-0 (ms) Dist (cm) Vel (m/s) Norm Vel (m/s)  Right Median Motor (Abd Poll Brev)  28.2C  Wrist    4.1 <4.2 7.8 >5 Elbow Wrist 3.8 20.0 53 >50  Elbow    7.9  7.0         Right Ulnar Motor (Abd Dig Min)  28C  Wrist    3.3 <4.2 10.3 >3 B Elbow Wrist 3.2 19.0 59 >53  B Elbow    6.5  8.2  A Elbow B Elbow 1.6 10.0 63 >53  A Elbow    8.1  8.5          EMG   Side Muscle Nerve Root Ins Act Fibs Psw Amp Dur Poly Recrt Int Dennie Bible Comment  Right Abd Poll Brev Median C8-T1 Nml Nml Nml Nml Nml 0 Nml Nml   Right 1stDorInt Ulnar C8-T1 Nml Nml Nml Nml Nml 0 Nml Nml   Right PronatorTeres Median C6-7 Nml Nml Nml Nml Nml 0 Nml Nml   Right Biceps Musculocut C5-6 Nml Nml Nml Nml Nml 0 Nml Nml   Right Deltoid Axillary C5-6 Nml Nml Nml Nml Nml 0 Nml Nml     Nerve Conduction Studies Anti Sensory Left/Right Comparison   Stim Site L Lat (ms) R Lat (  ms) L-R Lat (ms) L Amp (V) R Amp (V) L-R Amp (%) Site1 Site2 L Vel (m/s) R Vel (m/s) L-R Vel (m/s)  Median Acr Palm Anti Sensory (2nd Digit)  28.7C  Wrist  *4.3   63.6  Wrist Palm     Palm             Radial Anti Sensory (Base 1st Digit)  28.2C  Wrist  2.3   28.8  Wrist Base 1st Digit     Ulnar Anti Sensory (5th Digit)  28.7C  Wrist  3.6   34.5  Wrist 5th Digit  39    Motor Left/Right Comparison   Stim Site L Lat (ms) R Lat (ms) L-R Lat (ms) L Amp (mV) R Amp (mV) L-R Amp (%) Site1 Site2 L Vel (m/s) R Vel (m/s) L-R Vel (m/s)  Median Motor (Abd Poll Brev)  28.2C  Wrist  4.1   7.8  Elbow Wrist  53   Elbow  7.9   7.0        Ulnar Motor (Abd Dig Min)  28C  Wrist  3.3   10.3  B Elbow Wrist  59   B Elbow  6.5   8.2  A Elbow B Elbow  63   A Elbow  8.1   8.5           Waveforms:

## 2023-02-17 NOTE — Progress Notes (Signed)
YARITZY EBORN - 52 y.o. female MRN [id number]  Date of birth: 03/08/1971  Office Visit Note: Visit Date: 02/17/2023 PCP: Vonna Drafts, MD Referred by: Cammy Copa, MD  Subjective: Chief Complaint  Patient presents with   Right Hand - Numbness, Weakness   Right Arm - Pain, Weakness   HPI:  LATONDA FAMBROUGH is a 52 y.o. female who comes in today at the request of Dr. Burnard Bunting for evaluation and management of chronic, worsening and severe pain, numbness and tingling in the Right upper extremities.  Patient is Right hand dominant.  She reports several months of worsening right hand pain mostly palm of the hand that can refer up the arm to the elbow and even to that shoulder.  She gets nondermatomal global type numbness and paresthesia.  She very gingerly moves her hand and does not want to move it in any position due to pain.  She holds it in a flexed elbow position.  She has not really reported swelling or color change etc.  She states that it is excruciating pain despite the use of medication etc.  Initially seen in [facility] and diagnosed with tendinitis.  Has seen Dr. August Saucer.  There is an MRI pending.  We have actually seen her in the past for electrodiagnostic studies of her left upper arm on a few occasions which were normal.  She did have a prior right-sided electrodiagnostic study by Dr. Lesia Sago in 2016 which was normal.  She denies any frank radicular symptoms.  Does feel weak at times in the hand.   I spent more than 30 minutes speaking face-to-face with the patient with 50% of the time in counseling and discussing coordination of care.    Review of Systems  Musculoskeletal:  Positive for joint pain.  Neurological:  Positive for tingling and focal weakness.  All other systems reviewed and are negative.  Otherwise per HPI.  Assessment & Plan: Visit Diagnoses:    ICD-10-CM   1. Paresthesia of skin  R20.2 NCV with EMG (electromyography)     2. Pain in right wrist  M25.531     3. Pain in right hand  M79.641     4. Weakness  R53.1       Plan: Impression: Complicated picture of severe pain and dysfunction with apprehension with moving the arm with paresthesias.  Could be some level of carpal tunnel syndrome.  Clearly MRI of the arm and wrist is warranted and pending.  Could be significant tendinitis.  Could be early complex regional pain syndrome but does not meet the qualification for that.  Electrodiagnostic study performed.  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   There is no significant electrodiagnostic evidence of any other focal nerve entrapment, brachial plexopathy or cervical radiculopathy.  As you know, this particular electrodiagnostic study cannot rule out chemical radiculitis or sensory only radiculopathy.   **This electrodiagnostic study cannot rule out small fiber polyneuropathy and dysesthesias from central pain syndromes such as stroke or central pain sensitization syndromes such as fibromyalgia.  Myotomal referral pain from trigger points is also not excluded.  Recommendations: 1.  Follow-up with referring physician. MRI is pending. 2.  Continue current management of symptoms. 3.  She asked about bracing today and clinically her symptoms cannot be all due to the mild median neuropathy.  I did suggest that she  talk to Dr. August Saucer and his staff about possible bracing.  Meds & Orders: No orders of the defined types were placed in this encounter.   Orders Placed This Encounter  Procedures   NCV with EMG (electromyography)    Follow-up: No follow-ups on file.   Procedures: No procedures performed  EMG & NCV Findings: Evaluation of the right median (across palm) sensory nerve showed no response (Palm) and prolonged distal peak latency (4.3 ms).  All remaining nerves (as indicated  in the following tables) were within normal limits.    All examined muscles (as indicated in the following table) showed no evidence of electrical instability.    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   There is no significant electrodiagnostic evidence of any other focal nerve entrapment, brachial plexopathy or cervical radiculopathy.  As you know, this particular electrodiagnostic study cannot rule out chemical radiculitis or sensory only radiculopathy.   **This electrodiagnostic study cannot rule out small fiber polyneuropathy and dysesthesias from central pain syndromes such as stroke or central pain sensitization syndromes such as fibromyalgia.  Myotomal referral pain from trigger points is also not excluded.  Recommendations: 1.  Follow-up with referring physician. MRI is pending. 2.  Continue current management of symptoms. 3.  She asked about bracing today and clinically her symptoms cannot be all due to the mild median neuropathy.  I did suggest that she talk to Dr. August Saucer and his staff about possible bracing.    ___________________________ [facility] Board Certified, American Board of Physical Medicine and Rehabilitation    Nerve Conduction Studies Anti Sensory Summary Table   Stim Site NR Peak (ms) Norm Peak (ms) P-T Amp (V) Norm P-T Amp Site1 Site2 Delta-P (ms) Dist (cm) Vel (m/s) Norm Vel (m/s)  Right Median Acr Palm Anti Sensory (2nd Digit)  28.7C  Wrist    *4.3 <3.6 63.6 >10 Wrist Palm  0.0    Palm *NR  <2.0          Right Radial Anti Sensory (Base 1st Digit)  28.2C  Wrist    2.3 <3.1 28.8  Wrist Base 1st Digit 2.3 0.0    Right Ulnar Anti Sensory (5th Digit)  28.7C  Wrist    3.6 <3.7 34.5 >15.0 Wrist 5th Digit 3.6 14.0 39 >38   Motor Summary Table   Stim Site NR Onset (ms) Norm Onset (ms) O-P Amp (mV) Norm O-P  Amp Site1 Site2 Delta-0 (ms) Dist (cm) Vel (m/s) Norm Vel (m/s)  Right Median Motor (Abd Poll Brev)  28.2C  Wrist    4.1 <4.2 7.8 >5 Elbow Wrist 3.8 20.0 53 >50  Elbow    7.9  7.0         Right Ulnar Motor (Abd Dig Min)  28C  Wrist    3.3 <4.2 10.3 >3 B Elbow Wrist 3.2 19.0 59 >53  B Elbow    6.5  8.2  A Elbow B Elbow 1.6 10.0 63 >53  A Elbow    8.1  8.5          EMG   Side Muscle Nerve Root Ins Act Fibs Psw Amp Dur Poly Recrt Int Dennie Bible Comment  Right Abd Poll Brev Median C8-T1 Nml Nml Nml Nml Nml 0 Nml Nml   Right 1stDorInt Ulnar C8-T1 Nml Nml Nml Nml Nml 0 Nml Nml   Right PronatorTeres Median C6-7 Nml  Nml Nml Nml Nml 0 Nml Nml   Right Biceps Musculocut C5-6 Nml Nml Nml Nml Nml 0 Nml Nml   Right Deltoid Axillary C5-6 Nml Nml Nml Nml Nml 0 Nml Nml     Nerve Conduction Studies Anti Sensory Left/Right Comparison   Stim Site L Lat (ms) R Lat (ms) L-R Lat (ms) L Amp (V) R Amp (V) L-R Amp (%) Site1 Site2 L Vel (m/s) R Vel (m/s) L-R Vel (m/s)  Median Acr Palm Anti Sensory (2nd Digit)  28.7C  Wrist  *4.3   63.6  Wrist Palm     Palm             Radial Anti Sensory (Base 1st Digit)  28.2C  Wrist  2.3   28.8  Wrist Base 1st Digit     Ulnar Anti Sensory (5th Digit)  28.7C  Wrist  3.6   34.5  Wrist 5th Digit  39    Motor Left/Right Comparison   Stim Site L Lat (ms) R Lat (ms) L-R Lat (ms) L Amp (mV) R Amp (mV) L-R Amp (%) Site1 Site2 L Vel (m/s) R Vel (m/s) L-R Vel (m/s)  Median Motor (Abd Poll Brev)  28.2C  Wrist  4.1   7.8  Elbow Wrist  53   Elbow  7.9   7.0        Ulnar Motor (Abd Dig Min)  28C  Wrist  3.3   10.3  B Elbow Wrist  59   B Elbow  6.5   8.2  A Elbow B Elbow  63   A Elbow  8.1   8.5           Waveforms:             Clinical History: 06/04/2021 Impression: Essentially NORMAL electrodiagnostic study of the left upper limb.  There is no significant electrodiagnostic evidence of nerve entrapment, brachial plexopathy or cervical radiculopathy.  Specifically  no evidence of axonal or demyelinating long thoracic nerve neuropathy although this particular testing is fraught with technical difficulties.  No differences when compared to the left upper extremity study of 2020 although again that study did not specifically look at the long thoracic nerve.     As you know, purely sensory or demyelinating radiculopathies and chemical radiculitis may not be detected with this particular electrodiagnostic study.   Recommendations: 1.  Follow-up with referring physician. 2.  Continue current management of symptoms.   ___________________________ [facility] FAAP[facility] ------ 11/29/2018 Impression: Essentially NORMAL electrodiagnostic study of the left upper limb.  There is no significant electrodiagnostic evidence of nerve entrapment, brachial plexopathy or cervical radiculopathy.     As you know, purely sensory or demyelinating radiculopathies and chemical radiculitis may not be detected with this particular electrodiagnostic study.   Also this would not detect a purely demyelinated specific brachial plexus issue.   Recommendations: 1.  Follow-up with referring physician. 2.  Continue current management of symptoms.   ___________________________ [facility] FA[facility] ----- Nerve conduction studies IMPRESSION:   Nerve conduction studies done on the right upper extremity were unremarkable, without evidence of a peripheral neuropathy. 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 No evidence of a cervical radiculopathy was seen.   Marlan Palau MD 11/06/2014 11:09 AM     Objective:  VS:  HT:    WT:   BMI:  BP:   HR: bpm  TEMP: ( )  RESP:  Physical Exam Vitals and nursing note reviewed.  Constitutional:      General: She is not in acute distress.    Appearance: Normal appearance. She is well-developed.  She is not ill-appearing.  HENT:     Head: Normocephalic and atraumatic.  Eyes:     Conjunctiva/sclera: Conjunctivae normal.     Pupils: Pupils are equal, round, and reactive to light.  Cardiovascular:     Rate and Rhythm: Normal rate.     Pulses: Normal pulses.  Pulmonary:     Effort: Pulmonary effort is normal.  Musculoskeletal:        General: Tenderness present. No swelling or deformity.     Right lower leg: No edema.     Left lower leg: No edema.     Comments: Inspection reveals no atrophy of the bilateral APB or FDI or hand intrinsics. There is no swelling, color changes, allodynia or dystrophic changes.  Strength exam is difficult to the patient's apprehension with any movement of the arm and hand and fingers.. There is intact sensation to light touch in all dermatomal and peripheral nerve distributions.  There is a negative Hoffmann's test bilaterally.  Skin:    General: Skin is warm and dry.     Findings: No erythema or rash.  Neurological:     General: No focal deficit present.     Mental Status: She is alert and oriented to person, place, and time.     Cranial Nerves: No cranial nerve deficit.     Sensory: No sensory deficit.     Motor: No weakness or abnormal muscle tone.     Coordination: Coordination normal.     Gait: Gait normal.  Psychiatric:        Mood and Affect: Mood normal.        Behavior: Behavior normal.      Imaging: No results found.

## 2023-02-17 NOTE — Progress Notes (Signed)
Functional Pain Scale - descriptive words and definitions  Intense (8)    Cannot complete any ADLs without much assistance/cannot concentrate/conversation is difficult/unable to sleep and unable to use distraction. Severe range order  Average Pain 10  Right handed. Right hand pain that radiates into the arm to the shoulder. Numbness in all fingers. Unable to use right arm

## 2023-02-17 NOTE — Telephone Encounter (Signed)
Pt is already scheduled for 8/9

## 2023-02-21 ENCOUNTER — Ambulatory Visit (INDEPENDENT_AMBULATORY_CARE_PROVIDER_SITE_OTHER): Payer: Medicaid Other | Admitting: Family Medicine

## 2023-02-21 ENCOUNTER — Telehealth: Payer: Self-pay

## 2023-02-21 ENCOUNTER — Encounter: Payer: Self-pay | Admitting: Family Medicine

## 2023-02-21 VITALS — BP 108/70 | HR 75 | Ht 61.0 in | Wt 87.8 lb

## 2023-02-21 DIAGNOSIS — R296 Repeated falls: Secondary | ICD-10-CM

## 2023-02-21 DIAGNOSIS — N898 Other specified noninflammatory disorders of vagina: Secondary | ICD-10-CM | POA: Diagnosis not present

## 2023-02-21 MED ORDER — FLUCONAZOLE 150 MG PO TABS
150.0000 mg | ORAL_TABLET | ORAL | 0 refills | Status: AC
Start: 2023-02-21 — End: 2023-02-25

## 2023-02-21 NOTE — Progress Notes (Signed)
SUBJECTIVE:   CHIEF COMPLAINT / HPI:   Patient presents with primary concern of needing a new rolling walker.  She has had frequent falls over the past few weeks and feels that her current walker is not sized correctly and is a bit wobbly.  She has tried getting a new walker but was unsuccessful and needs a new order.  She has not injured her head or passed out.  Some scrapes on her knees and elbows but otherwise no other injuries.  Also has concern of a yeast infection with white vaginal discharge ongoing for a couple of weeks.  It is not painful or itchy.  She has had yeast infections in the past and this feels similar.  She would prefer to have treatment for yeast infection rather than undergoing testing at this time.  However she agrees that if her symptoms worsen she will come back for testing.  Not currently sexually active.   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 Frequent falls Patient reports frequent falls recently.  Mechanical in nature likely related to her chronic pain and problems being managed by orthopedics.  Her current walker is not sufficient for her day-to-day use.  She does have significant  mobility limitations that prevent her from performing her ADLs.  She is able to use a walker and her symptoms are alleviated with use of walker.  Patient would also benefit from formal evaluation by physical therapy for any further recommendations.  Provided patient with DME order and advised to take it to her local medical supply shop. - Ambulatory referral to Physical Therapy - For home use only DME 4 wheeled rolling walker with seat ([id number])  2. Vaginal discharge Patient reports symptoms consistent with likely yeast infection especially given her past history of the same.  Advised to take Diflucan and let us know if symptoms do not resolve after treatment.  At that time can consider bringing her in for further testing with wet mount.  She is not currently sexually active so low concern for STD. - fluconazole (DIFLUCAN) 150 MG tablet; Take 1 tablet (150 mg total) by mouth every 3 (three) days for 2 doses. 72 hours after the first dose, take another dose if symptoms persist.  Dispense: 2 tablet; Refill: 0  Meds ordered this encounter  Medications   fluconazole (DIFLUCAN) 150 MG tablet    Sig: Take 1 tablet (150 mg total) by mouth every 3 (three) days for 2 doses. 72 hours after the first dose, take another dose if symptoms persist.    Dispense:  2 tablet  Refill:  0   Return if symptoms worsen or fail to improve.  Vonna Drafts, MD [facility]

## 2023-02-21 NOTE — Patient Instructions (Addendum)
Please take the order for your walker to a local medical supply shop at your earliest convenience.  I have sent in some Diflucan for yeast infection.  Please take 1 tablet, then if your symptoms have not resolved after 72 hours you can take a second tablet.  Please let our office know if your symptoms persist after that and we can consider doing some additional testing.  I placed referral for you to see physical therapy.  You should receive a call about this within the next couple weeks.

## 2023-02-21 NOTE — Telephone Encounter (Signed)
Community message sent to [employer] for Rolling walker with seat.   Will await response.   Veronda Prude, RN

## 2023-02-22 ENCOUNTER — Other Ambulatory Visit: Payer: Self-pay | Admitting: *Deleted

## 2023-02-22 MED ORDER — GABAPENTIN 300 MG PO CAPS: 300.0000 mg | ORAL_CAPSULE | Freq: Three times a day (TID) | ORAL | 1 refills | Status: AC

## 2023-03-03 ENCOUNTER — Inpatient Hospital Stay: Admission: RE | Admit: 2023-03-03 | Payer: Medicaid Other | Source: Ambulatory Visit

## 2023-03-04 ENCOUNTER — Other Ambulatory Visit: Payer: Medicaid Other

## 2023-03-07 ENCOUNTER — Ambulatory Visit
Admission: RE | Admit: 2023-03-07 | Discharge: 2023-03-07 | Disposition: A | Discharge status: Home / Self Care | Payer: Medicaid Other | Source: Ambulatory Visit | Attending: Orthopedic Surgery | Admitting: Orthopedic Surgery

## 2023-03-07 DIAGNOSIS — M654 Radial styloid tenosynovitis [de Quervain]: Secondary | ICD-10-CM | POA: Diagnosis not present

## 2023-03-07 DIAGNOSIS — M25531 Pain in right wrist: Secondary | ICD-10-CM | POA: Diagnosis not present

## 2023-03-08 ENCOUNTER — Ambulatory Visit: Payer: Medicaid Other | Admitting: Orthopedic Surgery

## 2023-03-10 NOTE — Telephone Encounter (Signed)
Patient calls nurse line regarding Rollator. Received notification from [employer] that patient received new rolling walker in January 2024. Insurance will only cover every 3 years.   Patient states that current walker is too big, bulky and does not have a seat.   Advised patient to reach out to [employer] regarding options.   She will call back to our office if she needs anything further from [place].   Veronda Prude, RN

## 2023-03-17 NOTE — Telephone Encounter (Signed)
Patient returns call to nurse line regarding issues with Rollator. She states that [employer] is adamant that she cannot receive rollator for another three years.   Patient is going to call me back to provide me with number for Medicaid case worker. I will call and try to see if there is an override or PA that can be completed for this equipment.   Veronda Prude, RN

## 2023-03-20 ENCOUNTER — Ambulatory Visit: Payer: Medicaid Other | Attending: Family Medicine

## 2023-03-20 ENCOUNTER — Other Ambulatory Visit: Payer: Self-pay

## 2023-03-20 DIAGNOSIS — S248XXD Injury of other specified nerves of thorax, subsequent encounter: Secondary | ICD-10-CM | POA: Diagnosis not present

## 2023-03-20 DIAGNOSIS — R296 Repeated falls: Secondary | ICD-10-CM | POA: Diagnosis not present

## 2023-03-20 DIAGNOSIS — R2681 Unsteadiness on feet: Secondary | ICD-10-CM | POA: Diagnosis not present

## 2023-03-20 DIAGNOSIS — M6281 Muscle weakness (generalized): Secondary | ICD-10-CM | POA: Insufficient documentation

## 2023-03-20 NOTE — Therapy (Addendum)
OUTPATIENT PHYSICAL THERAPY LOWER EXTREMITY EVALUATION/DC SUMMARY   Patient Name: Annette Chang MRN: [id number] DOB:1970/12/13, 52 y.o., female Today's Date: 04/18/2023 PHYSICAL THERAPY DISCHARGE SUMMARY  Visits from Start of Care: 1  Current functional level related to goals / functional outcomes: UTA   Remaining deficits: UTA   Education / Equipment: HEP   Patient agrees to discharge. Patient goals were not met. Patient is being discharged due to not returning since the last visit.  END OF SESSION:   03/20/23 1246  PT Visits / Re-Eval  Visit Number 1  Number of Visits 4  Date for PT Re-Evaluation 05/20/23  Authorization  Authorization Type [place] [employer]  PT Time Calculation  PT Start Time 1130  PT Stop Time 1215  PT Time Calculation (min) 45 min  PT - End of Session  Activity Tolerance Patient tolerated treatment well  Behavior During Therapy [facility] for tasks assessed/performed     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   Blood transfusion without reported diagnosis    Depression    H/O rotator cuff surgery 01/02/2012   H/O tubal ligation 01/02/2012   H/O: C-section 01/02/2012   3    H/O: C-section 01/02/2012   3    Headache(784.0) 01/02/2012   Iron deficiency anemia 01/02/2012   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   Suicidal ideation 07/27/2022   Past Surgical History:  Procedure Laterality Date   CESAREAN SECTION     3 previous c sections   EYE SURGERY Bilateral    MUSCLE REPAIR Right 01/10/2017   RIGHT PECTORAL MAJOR TO SCAPULA MUSCLE TRANSFER (Right   NOVASURE ABLATION N/A 03/11/2014   Procedure: NOVASURE ABLATION;  Surgeon: Allie Bossier, MD;  Location: [facility];  Service: Gynecology;  Laterality: N/A;   PECTORALIS TENDON REPAIR Right 01/10/2017   Procedure: RIGHT PECTORAL MAJOR TO SCAPULA MUSCLE TRANSFER;  Surgeon: Cammy Copa, MD;  Location: [facility] OR;  Service: Orthopedics;   Laterality: Right;   PECTORALIS TENDON REPAIR Right 12/28/2017   Procedure: RIGHT REVISION TENDON TRANSFER OF PECTORALIS MAJOR;  Surgeon: Cammy Copa, MD;  Location: [facility] OR;  Service: Orthopedics;  Laterality: Right;   rotator cuff surgery     SHOULDER SURGERY     TUBAL LIGATION     Patient Active Problem List   Diagnosis Date Noted   Poor social situation 04/11/2023   Falls frequently 03/30/2023   Elevated blood pressure reading 03/30/2023   De Quervain's tenosynovitis, right 12/05/2022   Reflux esophagitis 02/03/2021   Irregular finger nails 11/27/2020   Current smoker 06/26/2020   Osteoporosis 12/08/2017   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   MDD (major depressive disorder) 03/29/2016   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   Chronic urticaria 08/06/2015   H/O rotator cuff surgery 01/02/2012   Arthritis of shoulder region, 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   Iron deficiency anemia 01/02/2012    PCP: Vonna Drafts, MD   REFERRING PROVIDER: McDiarmid, Leighton Roach, MD  REFERRING DIAG: R29.6 (ICD-10-CM) - Frequent falls  THERAPY DIAG:  Unsteadiness on feet - Plan: PT plan of care cert/re-cert  Muscle weakness (generalized) - Plan: PT plan of care cert/re-cert  Traumatic long thoracic nerve palsy, subsequent encounter - Plan: PT plan of care cert/re-cert  Rationale for Evaluation and Treatment: Rehabilitation  ONSET DATE: chronic  SUBJECTIVE:   SUBJECTIVE STATEMENT: Returns to OPPT for assessment for frequent falls.  Has been denied a rollator from insurance and  current RW does not meet her mobility needs.  PERTINENT HISTORY: 1. Frequent falls Patient reports frequent falls recently.  Mechanical in nature likely related to her chronic pain and problems being managed by orthopedics.  Her current walker is not sufficient for her day-to-day use.  She does have significant mobility limitations that prevent her from performing her ADLs.  She is able to use a walker and her symptoms are alleviated  with use of walker.  Patient would also benefit from formal evaluation by physical therapy for any further recommendations.  Provided patient with DME order and advised to take it to her local medical supply shop. - Ambulatory referral to Physical Therapy - For home use only DME 4 wheeled rolling walker with seat ([id number]) PAIN:  Are you having pain? Yes: NPRS scale: 8/10 Pain location: global Pain description: ache Aggravating factors: undetermined Relieving factors: undetermined  PRECAUTIONS: Fall  RED FLAGS: None   WEIGHT BEARING RESTRICTIONS: No  FALLS:  Has patient fallen in last 6 months? Yes. Number of falls 3+  LIVING ENVIRONMENT: Lives with: lives with their family Lives in: House/apartment Stairs:  1 Has following equipment at home: Walker - 2 wheeled  OCCUPATION: disability  PLOF: Independent with basic ADLs  PATIENT GOALS: To become more steady on her feet.    NEXT MD VISIT: PRN  OBJECTIVE:   DIAGNOSTIC FINDINGS: none  PATIENT SURVEYS:  FOTO TBD  MUSCLE LENGTH: N/A  POSTURE: left pelvic obliquity and right pelvic obliquity  PALPATION: deferred  LOWER EXTREMITY ROM: WNL throughout  Active ROM Right eval Left eval  Hip flexion    Hip extension    Hip abduction    Hip adduction    Hip internal rotation    Hip external rotation    Knee flexion    Knee extension    Ankle dorsiflexion    Ankle plantarflexion    Ankle inversion    Ankle eversion     (Blank rows = not tested)  LOWER EXTREMITY MMT:  MMT Right eval Left eval  Hip flexion 4- 4-  Hip extension 4- 4-  Hip abduction 4- 4-  Hip adduction    Hip internal rotation    Hip external rotation    Knee flexion 4- 4-  Knee extension 4- 4-  Ankle dorsiflexion    Ankle plantarflexion 4- 4-  Ankle inversion    Ankle eversion     (Blank rows = not tested)  LOWER EXTREMITY SPECIAL TESTS:  deferred  FUNCTIONAL TESTS:  5 times sit to stand: 17s arms crossed MCTSIB: Condition  1: Avg of 3 trials: 30 sec, Condition 2: Avg of 3 trials: 30 sec, Condition 3: Avg of 3 trials: 30 sec, Condition 4: Avg of 3 trials: 30 sec, and Total Score: 120/120   03/20/23 0001  Dynamic Gait Index  Level Surface 2  Change in Gait Speed 3  Gait with Horizontal Head Turns 2  Gait with Vertical Head Turns 2  Gait and Pivot Turn 2  Step Over Obstacle 2  Step Around Obstacles 2  Steps 2  Total Score 17    GAIT: Distance walked: 65ft x2 Assistive device utilized: None Level of assistance: Complete Independence Comments: unremarkable   TODAY'S TREATMENT:  DATE: 03/20/23 Eval    PATIENT EDUCATION:  Education details: Discussed eval findings, rehab rationale and POC and patient is in agreement  Person educated: Patient Education method: Explanation Education comprehension: verbalized understanding and needs further education  HOME EXERCISE PROGRAM: TBD  ASSESSMENT:  CLINICAL IMPRESSION: Patient is a 52 y.o. female who was seen today for physical therapy evaluation and treatment for frequent falls and BLE weakness. Patient has a dx of neuropathy and B foot sensation has been compromised.  She presents with deficits on DGI when performing head turn tasks, 5x STS and mCTSIB scores are functional.  Weakness noted in abduction B primarily.  Balance deficits my be related to potential BPPV and further testing needed.  Patient will benefit from a short episode of OPPT for LE strengthening  OBJECTIVE IMPAIRMENTS: Abnormal gait, decreased balance, decreased endurance, decreased knowledge of condition, decreased knowledge of use of DME, decreased mobility, difficulty walking, decreased ROM, and decreased strength.   ACTIVITY LIMITATIONS: carrying, lifting, squatting, and stairs  PERSONAL FACTORS: Behavior pattern, Fitness, Past/current experiences, Time since onset  of injury/illness/exacerbation, and 1 comorbidity: neuropathy  are also affecting patient's functional outcome.   REHAB POTENTIAL: Fair based on co-morbidities and chronicity  CLINICAL DECISION MAKING: Evolving/moderate complexity  EVALUATION COMPLEXITY: Moderate   GOALS: Goals reviewed with patient? No  SHORT TERM GOALS+LONG TERM GOALS: Target date: 04/24/2023   Patient to demonstrate independence in HEP  Baseline: TBD Goal status: INITIAL  2.  Increase DGI score to 19/24 Baseline: 17/24 Goal status: INITIAL  3.  Increase BLE strength to 4/5 Baseline:  MMT Right eval Left eval  Hip flexion 4- 4-  Hip extension 4- 4-  Hip abduction 4- 4-  Hip adduction    Hip internal rotation    Hip external rotation    Knee flexion 4- 4-  Knee extension 4- 4-  Ankle dorsiflexion    Ankle plantarflexion 4- 4-   Goal status: INITIAL  4.  Assess FOTO set goal Baseline: TBD Goal status: INITIAL    PLAN:  PT FREQUENCY: 1x/week  PT DURATION: 4 weeks  PLANNED INTERVENTIONS: Therapeutic exercises, Therapeutic activity, Neuromuscular re-education, Balance training, Gait training, Patient/Family education, Self Care, Joint mobilization, Stair training, DME instructions, and Re-evaluation  PLAN FOR NEXT SESSION: HEP review and update, manual techniques as appropriate, aerobic tasks, ROM and flexibility activities, strengthening and PREs, TPDN, gait and balance training as needed     Hildred Laser, PT 04/18/2023, 1:58 PM   Check all possible CPT codes: 95621 - PT Re-evaluation, 97110- Therapeutic Exercise, [phone]- Neuro Re-education, [phone] - Gait Training, [phone] - Manual Therapy, 97530 - Therapeutic Activities, and 97535 - Self Care    Check all conditions that are expected to impact treatment: {Conditions expected to impact treatment:Neurological condition and/or seizures   If treatment provided at initial evaluation, no treatment charged due to lack of authorization.

## 2023-03-23 ENCOUNTER — Ambulatory Visit: Payer: Medicaid Other | Admitting: Orthopedic Surgery

## 2023-03-23 ENCOUNTER — Other Ambulatory Visit: Payer: Self-pay

## 2023-03-23 DIAGNOSIS — M654 Radial styloid tenosynovitis [de Quervain]: Secondary | ICD-10-CM | POA: Diagnosis not present

## 2023-03-23 DIAGNOSIS — M25531 Pain in right wrist: Secondary | ICD-10-CM | POA: Diagnosis not present

## 2023-03-25 ENCOUNTER — Encounter: Payer: Self-pay | Admitting: Orthopedic Surgery

## 2023-03-25 NOTE — Progress Notes (Signed)
Office Visit Note   Patient: Annette Chang           Date of Birth: 1971/05/14           MRN: [id number] Visit Date: 03/23/2023 Requested by: Vonna Drafts, MD [address] [place],  [place] [address] PCP: Vonna Drafts, MD  Subjective: Chief Complaint  Patient presents with   Other     Review EMG/NCV and MRI wrist    HPI: Annette Chang is a 52 y.o. female who presents to the office reporting right wrist pain.  Since she was last seen she is having MRI of the right wrist as well as nerve study.  Nerve study showed mild carpal tunnel.  MRI showed significant de Quervain's tenosynovitis and no fracture or significant degenerative changes.  The wrist but is not helping her much.  Takes Tylenol which does help with the pain..                ROS: All systems reviewed are negative as they relate to the chief complaint within the history of present illness.  Patient denies fevers or chills.  Assessment & Plan: Visit Diagnoses:  1. Pain in right wrist     Plan: Impression is de Quervain's tenosynovitis of the right knee with marked carpal tunnel.  Ultrasound-guided injection performed today.  Risk and benefits are discussed including Continue no help from the injection as well as depigmentation.  We will see her back as needed.  She did have good pain relief with the anesthetic portion of the procedure.  Follow-Up Instructions: No follow-ups on file.   Orders:  Orders Placed This Encounter  Procedures   US Guided Needle Placement - No Linked Charges   No orders of the defined types were placed in this encounter.     Procedures: Hand/UE Inj: R extensor compartment 1 for de Quervain's tenosynovitis on 03/23/2023 9:02 PM Indications: therapeutic Details: 25 G needle, ultrasound-guided radial approach Outcome: tolerated well, no immediate complications Procedure, treatment alternatives, risks and benefits explained, specific risks discussed. Consent was given by the patient.  Immediately prior to procedure a time out was called to verify the correct patient, procedure, equipment, support staff and site/side marked as required. Patient was prepped and draped in the usual sterile fashion.       Clinical Data: No additional findings.  Objective: Vital Signs: There were no vitals taken for this visit.  Physical Exam:  Constitutional: Patient appears well-developed HEENT:  Head: Normocephalic Eyes:EOM are normal Neck: Normal range of motion Cardiovascular: Normal rate Pulmonary/chest: Effort normal Neurologic: Patient is alert Skin: Skin is warm Psychiatric: Patient has normal mood and affect  Ortho Exam: Ortho exam demonstrates positive Finkelstein's on the right tenderness to palpation over the radial styloid.  No snuffbox tenderness.  EPL FPL interosseous strength is intact.  Generally the patient has muscle wasting of bilateral upper and lower extremities.  Specialty Comments:  06/04/2021 Impression: Essentially NORMAL electrodiagnostic study of the left upper limb.  There is no significant electrodiagnostic evidence of nerve entrapment, brachial plexopathy or cervical radiculopathy.  Specifically no evidence of axonal or demyelinating long thoracic nerve neuropathy although this particular testing is fraught with technical difficulties.  No differences when compared to the left upper extremity study of 2020 although again that study did not specifically look at the long thoracic nerve.     As you know, purely sensory or demyelinating radiculopathies and chemical radiculitis may not be detected with this particular electrodiagnostic study.  Recommendations: 1.  Follow-up with referring physician. 2.  Continue current management of symptoms.   ___________________________ Naaman Plummer FAAPMR ------ 11/29/2018 Impression: Essentially NORMAL electrodiagnostic study of the left upper limb.  There is no significant electrodiagnostic evidence of nerve  entrapment, brachial plexopathy or cervical radiculopathy.     As you know, purely sensory or demyelinating radiculopathies and chemical radiculitis may not be detected with this particular electrodiagnostic study.   Also this would not detect a purely demyelinated specific brachial plexus issue.   Recommendations: 1.  Follow-up with referring physician. 2.  Continue current management of symptoms.   ___________________________ Naaman Plummer FAAPMR ----- Nerve conduction studies IMPRESSION:   Nerve conduction studies done on the right upper extremity were unremarkable, without evidence of a peripheral neuropathy. 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 No evidence of a cervical radiculopathy was seen.   Marlan Palau MD 11/06/2014 11:09 AM  Imaging: No results found.   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   Chronic urticaria 08/06/2015   H/O rotator cuff surgery 01/02/2012   Arthritis of shoulder region, 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   Iron deficiency anemia 01/02/2012   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   Blood transfusion without reported diagnosis    Depression    H/O rotator cuff surgery 01/02/2012   H/O tubal ligation 01/02/2012   H/O: C-section 01/02/2012   3    H/O: C-section 01/02/2012   3    Headache(784.0) 01/02/2012   Iron deficiency anemia 01/02/2012   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   Suicidal ideation 07/27/2022    Family History   Problem Relation Age of Onset   Diabetes Maternal Grandmother    Diabetes Paternal Grandmother    Diabetes Paternal Grandfather    Colon cancer Neg Hx    Esophageal cancer Neg Hx    Rectal cancer Neg Hx    Stomach cancer Neg Hx     Past Surgical History:  Procedure Laterality Date   CESAREAN SECTION     3 previous c sections   EYE SURGERY Bilateral    MUSCLE REPAIR Right 01/10/2017   RIGHT PECTORAL MAJOR TO SCAPULA MUSCLE TRANSFER (Right   NOVASURE ABLATION N/A 03/11/2014   Procedure: NOVASURE ABLATION;  Surgeon: Allie Bossier, MD;  Location: [facility];  Service: Gynecology;  Laterality: N/A;   PECTORALIS TENDON REPAIR Right 01/10/2017   Procedure: RIGHT PECTORAL MAJOR TO SCAPULA MUSCLE TRANSFER;  Surgeon: Cammy Copa, MD;  Location: [facility] OR;  Service: Orthopedics;  Laterality: Right;   PECTORALIS TENDON REPAIR Right 12/28/2017   Procedure: RIGHT REVISION TENDON TRANSFER OF PECTORALIS MAJOR;  Surgeon: Cammy Copa, MD;  Location: [facility] OR;  Service: Orthopedics;  Laterality: Right;   rotator cuff surgery     SHOULDER SURGERY     TUBAL LIGATION     Social History   Occupational History   Occupation: unemployed  Tobacco Use   Smoking status: Former    Current packs/day: 0.00    Types: Cigarettes  Start date: 11/2012    Quit date: 11/2017    Years since quitting: 5.3    Passive exposure: Past   Smokeless tobacco: Never  Vaping Use   Vaping status: Never Used  Substance and Sexual Activity   Alcohol use: Yes    Alcohol/week: 18.0 standard drinks of alcohol    Types: 18 Cans of beer per week    Comment: three times a week drink 6 beers in one sitting   Drug use: Yes    Types: Cocaine, Marijuana    Comment: marijuana used 3 times a weekly   Sexual activity: Not Currently    Birth control/protection: Surgical

## 2023-03-28 ENCOUNTER — Telehealth: Payer: Self-pay

## 2023-03-28 ENCOUNTER — Ambulatory Visit: Payer: Medicaid Other | Attending: Family Medicine

## 2023-03-28 ENCOUNTER — Other Ambulatory Visit: Payer: Self-pay | Admitting: Family Medicine

## 2023-03-28 NOTE — Therapy (Deleted)
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   Suicidal ideation 07/27/2022   Past Surgical History:  Procedure Laterality Date   CESAREAN SECTION     3 previous c sections   EYE SURGERY Bilateral    MUSCLE REPAIR Right 01/10/2017   RIGHT PECTORAL MAJOR TO SCAPULA MUSCLE TRANSFER (Right   NOVASURE ABLATION N/A 03/11/2014   Procedure: NOVASURE ABLATION;  Surgeon: Allie Bossier, MD;  Location: [facility];  Service: Gynecology;  Laterality: N/A;   PECTORALIS TENDON REPAIR Right 01/10/2017   Procedure: RIGHT PECTORAL MAJOR TO SCAPULA MUSCLE TRANSFER;  Surgeon: Cammy Copa, MD;  Location: [facility] OR;  Service: Orthopedics;  Laterality: Right;   PECTORALIS TENDON REPAIR Right 12/28/2017   Procedure: RIGHT REVISION TENDON TRANSFER OF PECTORALIS MAJOR;  Surgeon: Cammy Copa, MD;  Location: [facility] OR;  Service: Orthopedics;  Laterality: Right;   rotator cuff surgery     SHOULDER SURGERY     TUBAL LIGATION     Patient Active Problem List   Diagnosis Date Noted   De Quervain's tenosynovitis, right 12/05/2022   Reflux esophagitis 02/03/2021   Irregular finger nails 11/27/2020   Current smoker 06/26/2020   Osteoporosis 12/08/2017   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   MDD (major depressive disorder) 03/29/2016   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  Rationale for Evaluation and Treatment: Rehabilitation  ONSET DATE: chronic  SUBJECTIVE:   SUBJECTIVE STATEMENT: Returns to OPPT for assessment for frequent falls.  Has been denied a rollator from insurance and current RW does not meet her mobility needs.  PERTINENT HISTORY: 1. Frequent falls Patient reports frequent falls recently.  Mechanical in nature likely related to her chronic pain and problems being managed by orthopedics.  Her current walker is not sufficient for her day-to-day use.  She does have significant mobility limitations that prevent her from performing her ADLs.  She is able to use a walker and her symptoms are alleviated with use of walker.  Patient would also benefit from formal evaluation by physical therapy for any further recommendations.  Provided patient with DME order and advised to take it to her local medical supply shop. - Ambulatory referral to Physical Therapy - For home use only DME 4 wheeled rolling walker with seat ([id number]) PAIN:  Are you having pain? Yes: NPRS scale: 8/10 Pain location: global Pain description: ache Aggravating factors: undetermined Relieving factors: undetermined  PRECAUTIONS: Fall  RED FLAGS: None   WEIGHT BEARING RESTRICTIONS: No  FALLS:  Has patient fallen in last 6 months? Yes. Number of falls 3+  LIVING ENVIRONMENT: Lives  with: lives with their family Lives in: House/apartment Stairs:  1 Has following equipment at home: Environmental consultant - 2 wheeled  OCCUPATION: disability  PLOF: Independent with basic ADLs  PATIENT GOALS: To become more steady on her feet.    NEXT MD VISIT: PRN  OBJECTIVE:   DIAGNOSTIC FINDINGS: none  PATIENT SURVEYS:  FOTO TBD  MUSCLE LENGTH: N/A  POSTURE: left  pelvic obliquity and right pelvic obliquity  PALPATION: deferred  LOWER EXTREMITY ROM: WNL throughout  Active ROM Right eval Left eval  Hip flexion    Hip extension    Hip abduction    Hip adduction    Hip internal rotation    Hip external rotation    Knee flexion    Knee extension    Ankle dorsiflexion    Ankle plantarflexion    Ankle inversion    Ankle eversion     (Blank rows = not tested)  LOWER EXTREMITY MMT:  MMT Right eval Left eval  Hip flexion 4- 4-  Hip extension 4- 4-  Hip abduction 4- 4-  Hip adduction    Hip internal rotation    Hip external rotation    Knee flexion 4- 4-  Knee extension 4- 4-  Ankle dorsiflexion    Ankle plantarflexion 4- 4-  Ankle inversion    Ankle eversion     (Blank rows = not tested)  LOWER EXTREMITY SPECIAL TESTS:  deferred  FUNCTIONAL TESTS:  5 times sit to stand: 17s arms crossed MCTSIB: Condition 1: Avg of 3 trials: 30 sec, Condition 2: Avg of 3 trials: 30 sec, Condition 3: Avg of 3 trials: 30 sec, Condition 4: Avg of 3 trials: 30 sec, and Total Score: 120/120   03/20/23 0001  Dynamic Gait Index  Level Surface 2  Change in Gait Speed 3  Gait with Horizontal Head Turns 2  Gait with Vertical Head Turns 2  Gait and Pivot Turn 2  Step Over Obstacle 2  Step Around Obstacles 2  Steps 2  Total Score 17    GAIT: Distance walked: 52ft x2 Assistive device utilized: None Level of assistance: Complete Independence Comments: unremarkable   TODAY'S TREATMENT:                                                                                                                              DATE: 03/20/23 Eval    PATIENT EDUCATION:  Education details: Discussed eval findings, rehab rationale and POC and patient is in agreement  Person educated: Patient Education method: Explanation Education comprehension: verbalized understanding and needs further education  HOME EXERCISE PROGRAM: TBD  ASSESSMENT:  CLINICAL  IMPRESSION: Patient is a 52 y.o. female who was seen today for physical therapy evaluation and treatment for frequent falls and BLE weakness. Patient has a dx of neuropathy and B foot sensation has been compromised.  She presents with deficits on DGI when performing head turn tasks, 5x STS and mCTSIB scores are functional.  Weakness noted in abduction B primarily.  Balance deficits my be related to potential BPPV and further testing needed.  Patient will benefit from a short episode of OPPT for LE strengthening  OBJECTIVE IMPAIRMENTS: Abnormal gait, decreased balance, decreased endurance, decreased knowledge of condition, decreased knowledge of use of DME, decreased mobility, difficulty walking, decreased ROM, and decreased strength.   ACTIVITY LIMITATIONS: carrying, lifting, squatting, and stairs  PERSONAL FACTORS: Behavior pattern, Fitness, Past/current experiences, Time since onset of injury/illness/exacerbation, and 1 comorbidity: neuropathy  are also affecting patient's functional outcome.   REHAB POTENTIAL: Fair based on co-morbidities and chronicity  CLINICAL DECISION MAKING: Evolving/moderate complexity  EVALUATION COMPLEXITY: Moderate   GOALS: Goals reviewed with patient? No  SHORT TERM GOALS+LONG TERM GOALS: Target date: 04/24/2023   Patient to demonstrate independence in HEP  Baseline: TBD Goal status: INITIAL  2.  Increase DGI score to 19/24 Baseline: 17/24 Goal status: INITIAL  3.  Increase BLE strength to 4/5 Baseline:  MMT Right eval Left eval  Hip flexion 4- 4-  Hip extension 4- 4-  Hip abduction 4- 4-  Hip adduction    Hip internal rotation    Hip external rotation    Knee flexion 4- 4-  Knee extension 4- 4-  Ankle dorsiflexion    Ankle plantarflexion 4- 4-   Goal status: INITIAL  4.  Assess FOTO set goal Baseline: TBD Goal status: INITIAL    PLAN:  PT FREQUENCY: 1x/week  PT DURATION: 4 weeks  PLANNED INTERVENTIONS: Therapeutic exercises,  Therapeutic activity, Neuromuscular re-education, Balance training, Gait training, Patient/Family education, Self Care, Joint mobilization, Stair training, DME instructions, and Re-evaluation  PLAN FOR NEXT SESSION: HEP review and update, manual techniques as appropriate, aerobic tasks, ROM and flexibility activities, strengthening and PREs, TPDN, gait and balance training as needed     Hildred Laser, PT 03/28/2023, 7:14 AM   Check all possible CPT codes: 95621 - PT Re-evaluation, 97110- Therapeutic Exercise, [phone]- Neuro Re-education, [phone] - Gait Training, [phone] - Manual Therapy, 97530 - Therapeutic Activities, and 97535 - Self Care    Check all conditions that are expected to impact treatment: {Conditions expected to impact treatment:Neurological condition and/or seizures   If treatment provided at initial evaluation, no treatment charged due to lack of authorization.

## 2023-03-28 NOTE — Telephone Encounter (Signed)
TC due to missed visit.  Received message stating "unable to connect at this time".  Unable to leave VM.

## 2023-03-29 ENCOUNTER — Ambulatory Visit (INDEPENDENT_AMBULATORY_CARE_PROVIDER_SITE_OTHER): Payer: Medicaid Other | Admitting: Family Medicine

## 2023-03-29 VITALS — BP 154/104 | HR 77 | Ht 61.0 in | Wt 90.2 lb

## 2023-03-29 DIAGNOSIS — F331 Major depressive disorder, recurrent, moderate: Secondary | ICD-10-CM | POA: Diagnosis not present

## 2023-03-29 DIAGNOSIS — R270 Ataxia, unspecified: Secondary | ICD-10-CM | POA: Diagnosis not present

## 2023-03-29 DIAGNOSIS — W19XXXD Unspecified fall, subsequent encounter: Secondary | ICD-10-CM

## 2023-03-29 DIAGNOSIS — R296 Repeated falls: Secondary | ICD-10-CM

## 2023-03-29 DIAGNOSIS — R030 Elevated blood-pressure reading, without diagnosis of hypertension: Secondary | ICD-10-CM | POA: Diagnosis not present

## 2023-03-29 NOTE — Patient Instructions (Addendum)
Stop the trazodone. I will get labs today. I will also order an MRI of your brain. Try to stay hydrated with a light yellow urine color. Go to PT. Come back in 2 weeks to see how you are doing.   Therapy and Counseling Resources Most providers on this list will take Medicaid. Patients with commercial insurance or Medicare should contact their insurance company to get a list of in network providers.  [facility] (takes children) Location 1: [address] [place], [place] [address] Location 2: [address] [place], [place] [address] [phone]   [facility] (spanish speaking therapist available)(habla espanol)(take medicare and medicaid)  [address], [place], [place] [address], [email or web address] [phone]  [facility]ing [address] [facility] [address] [place], [place] [address] [phone]  [facility]   [address], [place], [place] [address]      [phone]  [facility] (spanish available) [address]  [place], [place] [address] [phone]  [facility] (take [facility] and medicare) [address], [address]  [place], [place] [address]       [phone]     [facility] (virtual only) [phone]  Jovita Kussmaul [facility] [address] [place], [place] [phone]  [facility]:  [address] [place] [phone]  [facility]:  [address] Hessie Diener [phone]  [facility] (under & uninsured) [address]   [place] [phone]    [email or web address]    [facility] [address]  [place]    [phone]  [facility] -[address]     Phone:  [phone]     [facility]-  [address]  [phone]   [facility] [address]      [place], [place]  [phone]  [facility]: [address] [place], [place], [place]  [phone]   [facility] (Spanish therapist) [email or web address]  [address] [place]  [address]   [place] [address]    [phone]    [facility]   [address],  [place], [place]  [phone]   [facility]  [address] [place]  [phone]  [facility]  [address] [place], [place]        [phone]  Open Access/Walk In Clinic under & uninsured  [facility]  [address] [place], [place] [phone] Crisis [phone]  [facility] [place],  (Spanish)   [address], [place]: [phone]) 8:30 - 12; 1 - 2:30  [facility] [employer],  [address] [place], [place]    [phone]):8:30 - 12; 2 - 3PM  [facility] [employer],  [address]  [place]; [phone]):   Mon - Fri 8 AM - 5 PM  [facility] [address] [place]  MWF 12:30 to 3:00 or call to schedule an appointment  [phone]  Specific Provider options Psychology Today  [email or web address] click on find a therapist  enter your zip code left side and select or tailor a therapist for your specific need.   [facility] Provider Directory [email or web address]  (Medicaid)   Follow all drop down to find a provider  Social Support program Mental Health  [phone] or PhotoSolver.pl [address], [place], [place] Recovery support and educational   24- Hour Availability:   [facility]  [address] [place], [place] [phone] Crisis [phone]  [facility] [phone]  [place] [facility]  [phone]   [facility]  [phone] (after hours)  Therapeutic Alternative/Mobile  Crisis   [phone]  [place] National Suicide Hotline  ([phone] Len Childs)  Call 911 or go to emergency room  [facility]  [phone]);  [place] and [employer]  [phone]); [place], [place], [place], [place], [place], [place], [place]

## 2023-03-30 ENCOUNTER — Encounter: Payer: Self-pay | Admitting: Family Medicine

## 2023-03-30 ENCOUNTER — Other Ambulatory Visit: Payer: Self-pay

## 2023-03-30 DIAGNOSIS — R296 Repeated falls: Secondary | ICD-10-CM | POA: Insufficient documentation

## 2023-03-30 DIAGNOSIS — R030 Elevated blood-pressure reading, without diagnosis of hypertension: Secondary | ICD-10-CM | POA: Insufficient documentation

## 2023-03-30 LAB — CBC WITH DIFFERENTIAL/PLATELET
Basophils Absolute: 0.1 10*3/uL (ref 0.0–0.2)
Basos: 1 %
EOS (ABSOLUTE): 0.1 10*3/uL (ref 0.0–0.4)
Eos: 2 %
Hematocrit: 45.1 % (ref 34.0–46.6)
Hemoglobin: 15.2 g/dL (ref 11.1–15.9)
Immature Grans (Abs): 0 10*3/uL (ref 0.0–0.1)
Immature Granulocytes: 0 %
Lymphocytes Absolute: 1.6 10*3/uL (ref 0.7–3.1)
Lymphs: 42 %
MCH: 30.8 pg (ref 26.6–33.0)
MCHC: 33.7 g/dL (ref 31.5–35.7)
MCV: 92 fL (ref 79–97)
Monocytes Absolute: 0.5 10*3/uL (ref 0.1–0.9)
Monocytes: 14 %
Neutrophils Absolute: 1.5 10*3/uL (ref 1.4–7.0)
Neutrophils: 41 %
Platelets: 207 10*3/uL (ref 150–450)
RBC: 4.93 x10E6/uL (ref 3.77–5.28)
RDW: 11.9 % (ref 11.7–15.4)
WBC: 3.8 10*3/uL (ref 3.4–10.8)

## 2023-03-30 LAB — COMPREHENSIVE METABOLIC PANEL
ALT: 17 IU/L (ref 0–32)
AST: 22 IU/L (ref 0–40)
Albumin: 4.7 g/dL (ref 3.8–4.9)
Alkaline Phosphatase: 91 IU/L (ref 44–121)
BUN/Creatinine Ratio: 11 (ref 9–23)
BUN: 6 mg/dL (ref 6–24)
Bilirubin Total: 0.5 mg/dL (ref 0.0–1.2)
CO2: 23 mmol/L (ref 20–29)
Calcium: 9.9 mg/dL (ref 8.7–10.2)
Chloride: 103 mmol/L (ref 96–106)
Creatinine, Ser: 0.57 mg/dL (ref 0.57–1.00)
Globulin, Total: 3 g/dL (ref 1.5–4.5)
Glucose: 82 mg/dL (ref 70–99)
Potassium: 3.9 mmol/L (ref 3.5–5.2)
Sodium: 141 mmol/L (ref 134–144)
Total Protein: 7.7 g/dL (ref 6.0–8.5)
eGFR: 109 mL/min/{1.73_m2} (ref >59)

## 2023-03-30 MED ORDER — TRAZODONE HCL 50 MG PO TABS: 50.0000 mg | ORAL_TABLET | Freq: Every evening | ORAL | 0 refills | Status: DC | PRN

## 2023-03-30 MED ORDER — SERTRALINE HCL 50 MG PO TABS: 50.0000 mg | ORAL_TABLET | Freq: Every day | ORAL | 0 refills | Status: DC

## 2023-03-30 NOTE — Assessment & Plan Note (Signed)
Worsening.  Reassuringly PHQ-9 #9 is negative.  She is on Zoloft 50 mg daily as well as Atarax 3 times daily.  Discussed not using Atarax if able (given this will likely help with her sedation and follows as above) and continuing Zoloft 50 for now.  Will assess her symptoms at next visit and consider going up to Zoloft 100 mg at that time.

## 2023-03-30 NOTE — Assessment & Plan Note (Signed)
Worsening problem.  Likely multifactorial in etiology with deconditioning and medication side effects contributing.  Reassuringly she is already established with PT.  Orthostatic vital signs negative in the office today, but patient does have elevated blood pressure even on recheck.  Reassured overall by her gait on my exam.  Decreased strength throughout could be due to what appears to be sarcopenia.  However, due to worsening falls, dizziness, strength decreased, and reported sensation asymmetry of her face, we will proceed with MRI brain to further evaluate.  Will also try to titrate off sedating medications; discussed stopping trazodone first.  Can tackle gabapentin and Robaxin at a later time given high risk of withdrawal.  Will also check CBC and CMP to assess for anemia and electrolyte disturbance.  Follow-up in 1 to 2 weeks to assess improvement.

## 2023-03-30 NOTE — Progress Notes (Signed)
    SUBJECTIVE:   CHIEF COMPLAINT / HPI:   Continued falls Patient has recurrent falls for the last year.  She has hit her head twice, but she has not hit her head recently.  She most recently fell a couple days ago and did not hit her head at this time.  She fell on her knees and elbows.  She states she feels dizzy when she stands up when she turns her head.  She denies any recent bleeding or blood loss.  She has been taking multiple sedating medications for her nerve pain, insomnia, and anxiety.  Depression and anxiety Feels that this problem has been worsening since she has continued to fall and had worsening health.  She is on Zoloft 50 mg and takes hydroxyzine daily 3 times.  However, the hydroxyzine sometimes makes her feel worse.  She does not have a therapist.  OBJECTIVE:   BP (!) 154/104   Pulse 77   Ht 5\' 1"  (1.549 m)   Wt 90 lb 4 oz (40.9 kg)   SpO2 99%   BMI 17.05 kg/m   General: Alert and oriented, in NAD, appears sarcopenic and thin Skin: Warm, dry, and intact  HEENT: NCAT, EOM grossly normal, midline nasal septum, intermittent conjunctival hemorrhages bilaterally Cardiac: Regular rate Respiratory: Breathing and speaking comfortably on RA Extremities: Moves all extremities grossly equally Neurological: Cranial nerves II through XII overall intact though patient reports decreased sensation over the left side versus the right side of her face; strength appears globally 4/5, gait overall normal when walking with examiner Psychiatric: Somewhat depressed mood and affect, negative #9 on PHQ-9  ASSESSMENT/PLAN:   Falls frequently Worsening problem.  Likely multifactorial in etiology with deconditioning and medication side effects contributing.  Reassuringly she is already established with PT.  Orthostatic vital signs negative in the office today, but patient does have elevated blood pressure even on recheck.  Reassured overall by her gait on my exam.  Decreased strength  throughout could be due to what appears to be sarcopenia.  However, due to worsening falls, dizziness, strength decreased, and reported sensation asymmetry of her face, we will proceed with MRI brain to further evaluate.  Will also try to titrate off sedating medications; discussed stopping trazodone first.  Can tackle gabapentin and Robaxin at a later time given high risk of withdrawal.  Will also check CBC and CMP to assess for anemia and electrolyte disturbance.  Follow-up in 1 to 2 weeks to assess improvement.  Elevated blood pressure reading Elevated today in the office on recheck.  Does not appear to been elevated in the recent past.  While patient does have dizziness, this has been going on for over a year, not believe this was related to her elevated BP.  Will recheck at next office visit in 1 to 2 weeks to determine next steps.  MDD (major depressive disorder) Worsening.  Reassuringly PHQ-9 #9 is negative.  She is on Zoloft 50 mg daily as well as Atarax 3 times daily.  Discussed not using Atarax if able (given this will likely help with her sedation and follows as above) and continuing Zoloft 50 for now.  Will assess her symptoms at next visit and consider going up to Zoloft 100 mg at that time.   Health maintenance Consider shingles vaccine and Pap smear at next visit with PCP.  Janeal Holmes, MD [facility]

## 2023-03-30 NOTE — Assessment & Plan Note (Signed)
Elevated today in the office on recheck.  Does not appear to been elevated in the recent past.  While patient does have dizziness, this has been going on for over a year, not believe this was related to her elevated BP.  Will recheck at next office visit in 1 to 2 weeks to determine next steps.

## 2023-04-03 NOTE — Therapy (Deleted)
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   Suicidal ideation 07/27/2022   Past Surgical History:  Procedure Laterality Date   CESAREAN SECTION     3 previous c sections   EYE SURGERY Bilateral    MUSCLE REPAIR Right 01/10/2017   RIGHT PECTORAL MAJOR TO SCAPULA MUSCLE TRANSFER (Right   NOVASURE ABLATION N/A 03/11/2014   Procedure: NOVASURE ABLATION;  Surgeon: Allie Bossier, MD;  Location: [facility];  Service: Gynecology;  Laterality: N/A;   PECTORALIS TENDON REPAIR Right 01/10/2017   Procedure: RIGHT PECTORAL MAJOR TO SCAPULA MUSCLE TRANSFER;  Surgeon: Cammy Copa, MD;  Location[facility] OR;  Service: Orthopedics;  Laterality: Right;   PECTORALIS TENDON REPAIR Right 12/28/2017   Procedure: RIGHT REVISION TENDON TRANSFER OF PECTORALIS MAJOR;  Surgeon: Cammy Copa, MD;  Location: [facility] OR;  Service: Orthopedics;  Laterality: Right;   rotator cuff surgery     SHOULDER SURGERY     TUBAL LIGATION     Patient Active Problem List   Diagnosis Date Noted   Falls frequently 03/30/2023   Elevated blood pressure reading 03/30/2023   De Quervain's tenosynovitis, right 12/05/2022   Reflux esophagitis 02/03/2021   Irregular finger nails 11/27/2020   Current smoker 06/26/2020   Osteoporosis 12/08/2017   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   MDD (major depressive disorder) 03/29/2016   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  Rationale for Evaluation and Treatment: Rehabilitation  ONSET DATE: chronic  SUBJECTIVE:   SUBJECTIVE STATEMENT: Returns to OPPT for assessment for frequent falls.  Has been denied a rollator from insurance and current RW does not meet her mobility needs.  PERTINENT HISTORY: 1. Frequent falls Patient reports frequent falls recently.  Mechanical in nature likely related to her chronic pain and problems being managed by orthopedics.  Her current walker is not sufficient for her day-to-day use.  She does have significant mobility limitations that prevent her from performing her ADLs.  She is able to use a walker and her symptoms are alleviated with use of walker.  Patient would also benefit from formal evaluation by physical therapy for any further recommendations.  Provided patient with DME order and advised to take it to her local medical supply shop. - Ambulatory referral to Physical Therapy - For home use only DME 4 wheeled rolling walker with seat ([id number]) PAIN:  Are you having pain? Yes: NPRS scale: 8/10 Pain location: global Pain description: ache Aggravating factors: undetermined Relieving factors: undetermined  PRECAUTIONS: Fall  RED FLAGS: None   WEIGHT BEARING RESTRICTIONS: No  FALLS:  Has patient fallen in  last 6 months? Yes. Number of falls 3+  LIVING ENVIRONMENT: Lives with: lives with their family Lives in: House/apartment Stairs:  1 Has following equipment at home: Walker - 2 wheeled  OCCUPATION: disability  PLOF: Independent with basic ADLs  PATIENT GOALS: To become more steady on her feet.    NEXT MD VISIT: PRN  OBJECTIVE:   DIAGNOSTIC FINDINGS:  none  PATIENT SURVEYS:  FOTO TBD  MUSCLE LENGTH: N/A  POSTURE: left pelvic obliquity and right pelvic obliquity  PALPATION: deferred  LOWER EXTREMITY ROM: WNL throughout  Active ROM Right eval Left eval  Hip flexion    Hip extension    Hip abduction    Hip adduction    Hip internal rotation    Hip external rotation    Knee flexion    Knee extension    Ankle dorsiflexion    Ankle plantarflexion    Ankle inversion    Ankle eversion     (Blank rows = not tested)  LOWER EXTREMITY MMT:  MMT Right eval Left eval  Hip flexion 4- 4-  Hip extension 4- 4-  Hip abduction 4- 4-  Hip adduction    Hip internal rotation    Hip external rotation    Knee flexion 4- 4-  Knee extension 4- 4-  Ankle dorsiflexion    Ankle plantarflexion 4- 4-  Ankle inversion    Ankle eversion     (Blank rows = not tested)  LOWER EXTREMITY SPECIAL TESTS:  deferred  FUNCTIONAL TESTS:  5 times sit to stand: 17s arms crossed MCTSIB: Condition 1: Avg of 3 trials: 30 sec, Condition 2: Avg of 3 trials: 30 sec, Condition 3: Avg of 3 trials: 30 sec, Condition 4: Avg of 3 trials: 30 sec, and Total Score: 120/120   03/20/23 0001  Dynamic Gait Index  Level Surface 2  Change in Gait Speed 3  Gait with Horizontal Head Turns 2  Gait with Vertical Head Turns 2  Gait and Pivot Turn 2  Step Over Obstacle 2  Step Around Obstacles 2  Steps 2  Total Score 17    GAIT: Distance walked: 18ft x2 Assistive device utilized: None Level of assistance: Complete Independence Comments: unremarkable   TODAY'S TREATMENT:                                                                                                                              DATE: 03/20/23 Eval    PATIENT EDUCATION:  Education details: Discussed eval findings, rehab rationale and POC and patient is in agreement  Person educated: Patient Education method: Explanation Education comprehension: verbalized understanding and needs further  education  HOME EXERCISE PROGRAM: TBD  ASSESSMENT:  CLINICAL IMPRESSION: Patient is a 52 y.o. female who was seen today for physical therapy evaluation and treatment for frequent falls and BLE weakness. Patient has a dx of neuropathy and B foot sensation has been compromised.  She presents with deficits on DGI when  performing head turn tasks, 5x STS and mCTSIB scores are functional.  Weakness noted in abduction B primarily.  Balance deficits my be related to potential BPPV and further testing needed.  Patient will benefit from a short episode of OPPT for LE strengthening  OBJECTIVE IMPAIRMENTS: Abnormal gait, decreased balance, decreased endurance, decreased knowledge of condition, decreased knowledge of use of DME, decreased mobility, difficulty walking, decreased ROM, and decreased strength.   ACTIVITY LIMITATIONS: carrying, lifting, squatting, and stairs  PERSONAL FACTORS: Behavior pattern, Fitness, Past/current experiences, Time since onset of injury/illness/exacerbation, and 1 comorbidity: neuropathy  are also affecting patient's functional outcome.   REHAB POTENTIAL: Fair based on co-morbidities and chronicity  CLINICAL DECISION MAKING: Evolving/moderate complexity  EVALUATION COMPLEXITY: Moderate   GOALS: Goals reviewed with patient? No  SHORT TERM GOALS+LONG TERM GOALS: Target date: 04/24/2023   Patient to demonstrate independence in HEP  Baseline: TBD Goal status: INITIAL  2.  Increase DGI score to 19/24 Baseline: 17/24 Goal status: INITIAL  3.  Increase BLE strength to 4/5 Baseline:  MMT Right eval Left eval  Hip flexion 4- 4-  Hip extension 4- 4-  Hip abduction 4- 4-  Hip adduction    Hip internal rotation    Hip external rotation    Knee flexion 4- 4-  Knee extension 4- 4-  Ankle dorsiflexion    Ankle plantarflexion 4- 4-   Goal status: INITIAL  4.  Assess FOTO set goal Baseline: TBD Goal status: INITIAL    PLAN:  PT FREQUENCY: 1x/week  PT  DURATION: 4 weeks  PLANNED INTERVENTIONS: Therapeutic exercises, Therapeutic activity, Neuromuscular re-education, Balance training, Gait training, Patient/Family education, Self Care, Joint mobilization, Stair training, DME instructions, and Re-evaluation  PLAN FOR NEXT SESSION: HEP review and update, manual techniques as appropriate, aerobic tasks, ROM and flexibility activities, strengthening and PREs, TPDN, gait and balance training as needed     Hildred Laser, PT 04/03/2023, 1:21 PM   Check all possible CPT codes: 16109 - PT Re-evaluation, 97110- Therapeutic Exercise, [phone]- Neuro Re-education, [phone] - Gait Training, [phone] - Manual Therapy, 97530 - Therapeutic Activities, and 97535 - Self Care    Check all conditions that are expected to impact treatment: {Conditions expected to impact treatment:Neurological condition and/or seizures   If treatment provided at initial evaluation, no treatment charged due to lack of authorization.

## 2023-04-04 ENCOUNTER — Ambulatory Visit: Payer: Medicaid Other

## 2023-04-07 NOTE — Telephone Encounter (Signed)
-----   Message from [facility] Jasmine December S sent at 04/05/2023  8:44 AM EDT ----- Ok to schedule MRI at [facility] Please schedule by 06/02/2023.  Clemencia Course

## 2023-04-07 NOTE — Telephone Encounter (Signed)
Patient is scheduled fr MRI 09/16 @5 

## 2023-04-10 ENCOUNTER — Ambulatory Visit (HOSPITAL_COMMUNITY): Payer: Medicaid Other

## 2023-04-10 NOTE — Therapy (Deleted)
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   Suicidal ideation 07/27/2022   Past Surgical History:  Procedure Laterality Date   CESAREAN SECTION     3 previous c sections   EYE SURGERY Bilateral    MUSCLE REPAIR Right 01/10/2017   RIGHT PECTORAL MAJOR TO SCAPULA MUSCLE TRANSFER (Right   NOVASURE ABLATION N/A 03/11/2014   Procedure: NOVASURE ABLATION;  Surgeon: Allie Bossier, MD;  Location: [facility];  Service: Gynecology;  Laterality: N/A;   PECTORALIS TENDON REPAIR Right 01/10/2017   Procedure: RIGHT PECTORAL MAJOR TO SCAPULA MUSCLE TRANSFER;  Surgeon: Cammy Copa, MD;  Location: [facility] OR;  Service: Orthopedics;  Laterality: Right;   PECTORALIS TENDON REPAIR Right 12/28/2017   Procedure: RIGHT REVISION TENDON TRANSFER OF PECTORALIS MAJOR;  Surgeon: Cammy Copa, MD;  Location: [facility] OR;  Service: Orthopedics;  Laterality: Right;   rotator cuff surgery     SHOULDER SURGERY     TUBAL LIGATION     Patient Active Problem List   Diagnosis Date Noted   Falls frequently 03/30/2023   Elevated blood pressure reading 03/30/2023   De Quervain's tenosynovitis, right 12/05/2022   Reflux esophagitis 02/03/2021   Irregular finger nails 11/27/2020   Current smoker 06/26/2020   Osteoporosis 12/08/2017   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   MDD (major depressive disorder) 03/29/2016   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  Rationale for Evaluation and Treatment: Rehabilitation  ONSET DATE: chronic  SUBJECTIVE:   SUBJECTIVE STATEMENT: Returns to OPPT for assessment for frequent falls.  Has been denied a rollator from insurance and current RW does not meet her mobility needs.  PERTINENT HISTORY: 1. Frequent falls Patient reports frequent falls recently.  Mechanical in nature likely related to her chronic pain and problems being managed by orthopedics.  Her current walker is not sufficient for her day-to-day use.  She does have significant mobility limitations that prevent her from performing her ADLs.  She is able to use a walker and her symptoms are alleviated with use of walker.  Patient would also benefit from formal evaluation by physical therapy for any further recommendations.  Provided patient with DME order and advised to take it to her local medical supply shop. - Ambulatory referral to Physical Therapy - For home use only DME 4 wheeled rolling walker with seat ([id number]) PAIN:  Are you having pain? Yes: NPRS scale: 8/10 Pain location: global Pain description: ache Aggravating factors: undetermined Relieving factors: undetermined  PRECAUTIONS: Fall  RED FLAGS: None   WEIGHT BEARING RESTRICTIONS: No  FALLS:  Has patient fallen in  last 6 months? Yes. Number of falls 3+  LIVING ENVIRONMENT: Lives with: lives with their family Lives in: House/apartment Stairs:  1 Has following equipment at home: Walker - 2 wheeled  OCCUPATION: disability  PLOF: Independent with basic ADLs  PATIENT GOALS: To become more steady on her feet.    NEXT MD VISIT: PRN  OBJECTIVE:   DIAGNOSTIC FINDINGS:  none  PATIENT SURVEYS:  FOTO TBD  MUSCLE LENGTH: N/A  POSTURE: left pelvic obliquity and right pelvic obliquity  PALPATION: deferred  LOWER EXTREMITY ROM: WNL throughout  Active ROM Right eval Left eval  Hip flexion    Hip extension    Hip abduction    Hip adduction    Hip internal rotation    Hip external rotation    Knee flexion    Knee extension    Ankle dorsiflexion    Ankle plantarflexion    Ankle inversion    Ankle eversion     (Blank rows = not tested)  LOWER EXTREMITY MMT:  MMT Right eval Left eval  Hip flexion 4- 4-  Hip extension 4- 4-  Hip abduction 4- 4-  Hip adduction    Hip internal rotation    Hip external rotation    Knee flexion 4- 4-  Knee extension 4- 4-  Ankle dorsiflexion    Ankle plantarflexion 4- 4-  Ankle inversion    Ankle eversion     (Blank rows = not tested)  LOWER EXTREMITY SPECIAL TESTS:  deferred  FUNCTIONAL TESTS:  5 times sit to stand: 17s arms crossed MCTSIB: Condition 1: Avg of 3 trials: 30 sec, Condition 2: Avg of 3 trials: 30 sec, Condition 3: Avg of 3 trials: 30 sec, Condition 4: Avg of 3 trials: 30 sec, and Total Score: 120/120   03/20/23 0001  Dynamic Gait Index  Level Surface 2  Change in Gait Speed 3  Gait with Horizontal Head Turns 2  Gait with Vertical Head Turns 2  Gait and Pivot Turn 2  Step Over Obstacle 2  Step Around Obstacles 2  Steps 2  Total Score 17    GAIT: Distance walked: 41ft x2 Assistive device utilized: None Level of assistance: Complete Independence Comments: unremarkable   TODAY'S TREATMENT:                                                                                                                              DATE: 03/20/23 Eval    PATIENT EDUCATION:  Education details: Discussed eval findings, rehab rationale and POC and patient is in agreement  Person educated: Patient Education method: Explanation Education comprehension: verbalized understanding and needs further  education  HOME EXERCISE PROGRAM: TBD  ASSESSMENT:  CLINICAL IMPRESSION: Patient is a 52 y.o. female who was seen today for physical therapy evaluation and treatment for frequent falls and BLE weakness. Patient has a dx of neuropathy and B foot sensation has been compromised.  She presents with deficits on DGI when  performing head turn tasks, 5x STS and mCTSIB scores are functional.  Weakness noted in abduction B primarily.  Balance deficits my be related to potential BPPV and further testing needed.  Patient will benefit from a short episode of OPPT for LE strengthening  OBJECTIVE IMPAIRMENTS: Abnormal gait, decreased balance, decreased endurance, decreased knowledge of condition, decreased knowledge of use of DME, decreased mobility, difficulty walking, decreased ROM, and decreased strength.   ACTIVITY LIMITATIONS: carrying, lifting, squatting, and stairs  PERSONAL FACTORS: Behavior pattern, Fitness, Past/current experiences, Time since onset of injury/illness/exacerbation, and 1 comorbidity: neuropathy  are also affecting patient's functional outcome.   REHAB POTENTIAL: Fair based on co-morbidities and chronicity  CLINICAL DECISION MAKING: Evolving/moderate complexity  EVALUATION COMPLEXITY: Moderate   GOALS: Goals reviewed with patient? No  SHORT TERM GOALS+LONG TERM GOALS: Target date: 04/24/2023   Patient to demonstrate independence in HEP  Baseline: TBD Goal status: INITIAL  2.  Increase DGI score to 19/24 Baseline: 17/24 Goal status: INITIAL  3.  Increase BLE strength to 4/5 Baseline:  MMT Right eval Left eval  Hip flexion 4- 4-  Hip extension 4- 4-  Hip abduction 4- 4-  Hip adduction    Hip internal rotation    Hip external rotation    Knee flexion 4- 4-  Knee extension 4- 4-  Ankle dorsiflexion    Ankle plantarflexion 4- 4-   Goal status: INITIAL  4.  Assess FOTO set goal Baseline: TBD Goal status: INITIAL    PLAN:  PT FREQUENCY: 1x/week  PT  DURATION: 4 weeks  PLANNED INTERVENTIONS: Therapeutic exercises, Therapeutic activity, Neuromuscular re-education, Balance training, Gait training, Patient/Family education, Self Care, Joint mobilization, Stair training, DME instructions, and Re-evaluation  PLAN FOR NEXT SESSION: HEP review and update, manual techniques as appropriate, aerobic tasks, ROM and flexibility activities, strengthening and PREs, TPDN, gait and balance training as needed     Hildred Laser, PT 04/10/2023, 1:40 PM   Check all possible CPT codes: 25427 - PT Re-evaluation, 97110- Therapeutic Exercise, [phone]- Neuro Re-education, [phone] - Gait Training, [phone] - Manual Therapy, 97530 - Therapeutic Activities, and 97535 - Self Care    Check all conditions that are expected to impact treatment: {Conditions expected to impact treatment:Neurological condition and/or seizures   If treatment provided at initial evaluation, no treatment charged due to lack of authorization.

## 2023-04-11 ENCOUNTER — Ambulatory Visit: Payer: Medicaid Other

## 2023-04-11 ENCOUNTER — Ambulatory Visit (INDEPENDENT_AMBULATORY_CARE_PROVIDER_SITE_OTHER): Payer: Medicaid Other | Admitting: Family Medicine

## 2023-04-11 ENCOUNTER — Telehealth: Payer: Self-pay

## 2023-04-11 ENCOUNTER — Encounter: Payer: Self-pay | Admitting: Family Medicine

## 2023-04-11 VITALS — BP 105/87 | HR 74 | Ht 61.0 in | Wt 92.4 lb

## 2023-04-11 DIAGNOSIS — F331 Major depressive disorder, recurrent, moderate: Secondary | ICD-10-CM | POA: Diagnosis not present

## 2023-04-11 DIAGNOSIS — Z609 Problem related to social environment, unspecified: Secondary | ICD-10-CM

## 2023-04-11 DIAGNOSIS — R030 Elevated blood-pressure reading, without diagnosis of hypertension: Secondary | ICD-10-CM | POA: Diagnosis not present

## 2023-04-11 DIAGNOSIS — R296 Repeated falls: Secondary | ICD-10-CM

## 2023-04-11 DIAGNOSIS — F17200 Nicotine dependence, unspecified, uncomplicated: Secondary | ICD-10-CM | POA: Diagnosis not present

## 2023-04-11 HISTORY — DX: Repeated falls: R29.6

## 2023-04-11 MED ORDER — BUPROPION HCL ER (XL) 150 MG PO TB24: 150.0000 mg | ORAL_TABLET | Freq: Every day | ORAL | 1 refills | Status: DC

## 2023-04-11 NOTE — Assessment & Plan Note (Signed)
Continuing to cut down.  Continue nicotine supplementation.  Will assess improvement with addition of Wellbutrin.

## 2023-04-11 NOTE — Assessment & Plan Note (Signed)
Much improved with less sedating medications.  Continue to avoid Atarax and trazodone as possible.  Continue physical therapy.  Given persistent abnormal neurological exam, will message team to help patient reschedule MRI.

## 2023-04-11 NOTE — Assessment & Plan Note (Signed)
Back to baseline today.  No further treatment needed.

## 2023-04-11 NOTE — Assessment & Plan Note (Addendum)
Stop Zoloft 50 mg daily given side effects.  Given her concurrent tobacco use and desire to stop, will start Wellbutrin XL 150 mg daily. Negative history of seizures listed in chart, but I did call her home phone and leave a message with her neighbor to call us back to confirm her negative history. Discussed returning in 1 month to assess medication use and continue to work on getting a therapist.
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
## 2023-04-11 NOTE — Telephone Encounter (Signed)
-----   Message from [facility] sent at 04/11/2023  1:41 PM EDT ----- Hi team,  Can we get this patient rescheduled for her MRI? She did not know it was scheduled for yesterday.  While on the phone with her, can we schedule her for a 1 month follow up for mood?  Thanks so much!

## 2023-04-11 NOTE — Assessment & Plan Note (Signed)
Provided legal aid resources for patient to contact to try and help with housing concerns which can certainly impact her overall wellbeing.  Advised to follow-up with me should these initial resources not be successful.
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
## 2023-04-11 NOTE — Telephone Encounter (Signed)
TC due to missed visit.  Message "call cannot be complete at this time.  Please try again later".

## 2023-04-12 NOTE — Telephone Encounter (Signed)
Set MRI for patient September 26th at [facility] at 1:30pm. Office Visit in one month is 10/18 at 1:50pm LVM for patient to return call to inform her about the upcoming appointments.  Drusilla Kanner, CMA

## 2023-04-14 NOTE — Therapy (Deleted)
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   Suicidal ideation 07/27/2022   Past Surgical History:  Procedure Laterality Date   CESAREAN SECTION     3 previous c sections   EYE SURGERY Bilateral    MUSCLE REPAIR Right 01/10/2017   RIGHT PECTORAL MAJOR TO SCAPULA MUSCLE TRANSFER (Right   NOVASURE ABLATION N/A 03/11/2014   Procedure: NOVASURE ABLATION;  Surgeon: Allie Bossier, MD;  Location: [facility];  Service: Gynecology;  Laterality: N/A;   PECTORALIS TENDON REPAIR Right 01/10/2017   Procedure: RIGHT PECTORAL MAJOR TO SCAPULA MUSCLE TRANSFER;  Surgeon: Cammy Copa, MD;  Location: [facility] OR;  Service: Orthopedics;  Laterality: Right;   PECTORALIS TENDON REPAIR Right 12/28/2017   Procedure: RIGHT REVISION TENDON TRANSFER OF PECTORALIS MAJOR;  Surgeon: Cammy Copa, MD;  Location: [facility] OR;  Service: Orthopedics;  Laterality: Right;   rotator cuff surgery     SHOULDER SURGERY     TUBAL LIGATION     Patient Active Problem List   Diagnosis Date Noted   Poor social situation 04/11/2023   Falls frequently 03/30/2023   Elevated blood pressure reading 03/30/2023   De Quervain's tenosynovitis, right 12/05/2022   Reflux esophagitis 02/03/2021   Irregular finger nails 11/27/2020   Current smoker 06/26/2020   Osteoporosis 12/08/2017   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   MDD (major depressive disorder) 03/29/2016   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  Rationale for Evaluation and Treatment: Rehabilitation  ONSET DATE: chronic  SUBJECTIVE:   SUBJECTIVE STATEMENT: Returns to OPPT for assessment for frequent falls.  Has been denied a rollator from insurance and current RW does not meet her mobility needs.  PERTINENT HISTORY: 1. Frequent falls Patient reports frequent falls recently.  Mechanical in nature likely related to her chronic pain and problems being managed by orthopedics.  Her current walker is not sufficient for her day-to-day use.  She does have significant mobility limitations that prevent her from performing her ADLs.  She is able to use a walker and her symptoms are alleviated with use of walker.  Patient would also benefit from formal evaluation by physical therapy for any further recommendations.  Provided patient with DME order and advised to take it to her local medical supply shop. - Ambulatory referral to Physical Therapy - For home use only DME 4 wheeled rolling walker with seat ([id number]) PAIN:  Are you having pain? Yes: NPRS scale: 8/10 Pain location: global Pain description: ache Aggravating factors: undetermined Relieving factors: undetermined  PRECAUTIONS: Fall  RED FLAGS: None   WEIGHT BEARING RESTRICTIONS: No  FALLS:  Has patient fallen in last 6 months? Yes. Number of falls 3+  LIVING ENVIRONMENT: Lives with: lives with their family Lives in: House/apartment Stairs:  1 Has following equipment at home: Walker - 2 wheeled  OCCUPATION: disability  PLOF: Independent with basic ADLs  PATIENT GOALS: To become more steady on her feet.    NEXT MD VISIT:  PRN  OBJECTIVE:   DIAGNOSTIC FINDINGS: none  PATIENT SURVEYS:  FOTO TBD  MUSCLE LENGTH: N/A  POSTURE: left pelvic obliquity and right pelvic obliquity  PALPATION: deferred  LOWER EXTREMITY ROM: WNL throughout  Active ROM Right eval Left eval  Hip flexion    Hip extension    Hip abduction    Hip adduction    Hip internal rotation    Hip external rotation    Knee flexion    Knee extension    Ankle dorsiflexion    Ankle plantarflexion    Ankle inversion    Ankle eversion     (Blank rows = not tested)  LOWER EXTREMITY MMT:  MMT Right eval Left eval  Hip flexion 4- 4-  Hip extension 4- 4-  Hip abduction 4- 4-  Hip adduction    Hip internal rotation    Hip external rotation    Knee flexion 4- 4-  Knee extension 4- 4-  Ankle dorsiflexion    Ankle plantarflexion 4- 4-  Ankle inversion    Ankle eversion     (Blank rows = not tested)  LOWER EXTREMITY SPECIAL TESTS:  deferred  FUNCTIONAL TESTS:  5 times sit to stand: 17s arms crossed MCTSIB: Condition 1: Avg of 3 trials: 30 sec, Condition 2: Avg of 3 trials: 30 sec, Condition 3: Avg of 3 trials: 30 sec, Condition 4: Avg of 3 trials: 30 sec, and Total Score: 120/120   03/20/23 0001  Dynamic Gait Index  Level Surface 2  Change in Gait Speed 3  Gait with Horizontal Head Turns 2  Gait with Vertical Head Turns 2  Gait and Pivot Turn 2  Step Over Obstacle 2  Step Around Obstacles 2  Steps 2  Total Score 17    GAIT: Distance walked: 105ft x2 Assistive device utilized: None Level of assistance: Complete Independence Comments: unremarkable   TODAY'S TREATMENT:                                                                                                                              DATE: 03/20/23 Eval    PATIENT EDUCATION:  Education details: Discussed eval findings, rehab rationale and POC and patient is in agreement  Person educated: Patient Education method: Explanation Education comprehension:  verbalized understanding and needs further education  HOME EXERCISE PROGRAM: TBD  ASSESSMENT:  CLINICAL IMPRESSION: Patient is a 52 y.o. female who was seen today for physical therapy evaluation and treatment for frequent falls and BLE weakness. Patient has a dx of neuropathy and B foot sensation has been compromised.  She  presents with deficits on DGI when performing head turn tasks, 5x STS and mCTSIB scores are functional.  Weakness noted in abduction B primarily.  Balance deficits my be related to potential BPPV and further testing needed.  Patient will benefit from a short episode of OPPT for LE strengthening  OBJECTIVE IMPAIRMENTS: Abnormal gait, decreased balance, decreased endurance, decreased knowledge of condition, decreased knowledge of use of DME, decreased mobility, difficulty walking, decreased ROM, and decreased strength.   ACTIVITY LIMITATIONS: carrying, lifting, squatting, and stairs  PERSONAL FACTORS: Behavior pattern, Fitness, Past/current experiences, Time since onset of injury/illness/exacerbation, and 1 comorbidity: neuropathy  are also affecting patient's functional outcome.   REHAB POTENTIAL: Fair based on co-morbidities and chronicity  CLINICAL DECISION MAKING: Evolving/moderate complexity  EVALUATION COMPLEXITY: Moderate   GOALS: Goals reviewed with patient? No  SHORT TERM GOALS+LONG TERM GOALS: Target date: 04/24/2023   Patient to demonstrate independence in HEP  Baseline: TBD Goal status: INITIAL  2.  Increase DGI score to 19/24 Baseline: 17/24 Goal status: INITIAL  3.  Increase BLE strength to 4/5 Baseline:  MMT Right eval Left eval  Hip flexion 4- 4-  Hip extension 4- 4-  Hip abduction 4- 4-  Hip adduction    Hip internal rotation    Hip external rotation    Knee flexion 4- 4-  Knee extension 4- 4-  Ankle dorsiflexion    Ankle plantarflexion 4- 4-   Goal status: INITIAL  4.  Assess FOTO set goal Baseline: TBD Goal status:  INITIAL    PLAN:  PT FREQUENCY: 1x/week  PT DURATION: 4 weeks  PLANNED INTERVENTIONS: Therapeutic exercises, Therapeutic activity, Neuromuscular re-education, Balance training, Gait training, Patient/Family education, Self Care, Joint mobilization, Stair training, DME instructions, and Re-evaluation  PLAN FOR NEXT SESSION: HEP review and update, manual techniques as appropriate, aerobic tasks, ROM and flexibility activities, strengthening and PREs, TPDN, gait and balance training as needed     Hildred Laser, PT 04/14/2023, 12:28 PM   Check all possible CPT codes: 62376 - PT Re-evaluation, 97110- Therapeutic Exercise, [phone]- Neuro Re-education, [phone] - Gait Training, [phone] - Manual Therapy, 97530 - Therapeutic Activities, and 97535 - Self Care    Check all conditions that are expected to impact treatment: {Conditions expected to impact treatment:Neurological condition and/or seizures   If treatment provided at initial evaluation, no treatment charged due to lack of authorization.

## 2023-04-17 NOTE — Progress Notes (Signed)
Patient already has appointment scheduled for October.   Thanks [employer]

## 2023-04-18 ENCOUNTER — Ambulatory Visit: Payer: Medicaid Other

## 2023-04-20 ENCOUNTER — Ambulatory Visit (HOSPITAL_COMMUNITY): Admission: RE | Admit: 2023-04-20 | Payer: Medicaid Other | Source: Ambulatory Visit

## 2023-04-21 ENCOUNTER — Other Ambulatory Visit: Payer: Self-pay | Admitting: Family Medicine

## 2023-04-28 ENCOUNTER — Ambulatory Visit (HOSPITAL_COMMUNITY)
Admission: RE | Admit: 2023-04-28 | Discharge: 2023-04-28 | Disposition: A | Discharge status: Home / Self Care | Payer: Medicaid Other | Source: Ambulatory Visit | Attending: Family Medicine | Admitting: Family Medicine

## 2023-04-28 DIAGNOSIS — S0990XA Unspecified injury of head, initial encounter: Secondary | ICD-10-CM | POA: Diagnosis not present

## 2023-04-28 DIAGNOSIS — R270 Ataxia, unspecified: Secondary | ICD-10-CM | POA: Diagnosis not present

## 2023-04-28 DIAGNOSIS — W19XXXD Unspecified fall, subsequent encounter: Secondary | ICD-10-CM | POA: Diagnosis not present

## 2023-04-28 DIAGNOSIS — R531 Weakness: Secondary | ICD-10-CM | POA: Diagnosis not present

## 2023-04-28 MED ORDER — GADOBUTROL 1 MMOL/ML IV SOLN
4.0000 mL | Freq: Once | INTRAVENOUS | Status: AC | PRN
  Administered 2023-04-28: 4 mL via INTRAVENOUS

## 2023-04-30 ENCOUNTER — Encounter: Payer: Self-pay | Admitting: Family Medicine

## 2023-05-08 ENCOUNTER — Other Ambulatory Visit: Payer: Self-pay | Admitting: Family Medicine

## 2023-05-10 ENCOUNTER — Telehealth: Payer: Self-pay | Admitting: Orthopedic Surgery

## 2023-05-10 NOTE — Telephone Encounter (Signed)
I would say she needs to be seen again so we can talk about risks and benefits of surgery and expected recovery

## 2023-05-10 NOTE — Telephone Encounter (Signed)
Pt called in stating the injection she got a few months ago has worn off and she is back to having pain and she cannot hold items, she has an appt for 11/4 which was the next avail but per the pt August Saucer told her the next step would be surgery, and she is ready to move on, please advise

## 2023-05-11 NOTE — Telephone Encounter (Signed)
Lvm advising pt.

## 2023-05-12 ENCOUNTER — Ambulatory Visit: Payer: Medicaid Other | Admitting: Family Medicine

## 2023-05-18 ENCOUNTER — Other Ambulatory Visit: Payer: Self-pay | Admitting: Orthopedic Surgery

## 2023-05-18 ENCOUNTER — Other Ambulatory Visit: Payer: Self-pay | Admitting: Family Medicine

## 2023-05-23 ENCOUNTER — Other Ambulatory Visit: Payer: Self-pay | Admitting: Family Medicine

## 2023-05-29 ENCOUNTER — Ambulatory Visit: Payer: Medicaid Other | Admitting: Orthopedic Surgery

## 2023-05-29 DIAGNOSIS — M654 Radial styloid tenosynovitis [de Quervain]: Secondary | ICD-10-CM | POA: Diagnosis not present

## 2023-05-31 ENCOUNTER — Encounter: Payer: Self-pay | Admitting: Orthopedic Surgery

## 2023-05-31 NOTE — Progress Notes (Signed)
Office Visit Note   Patient: Annette Chang           Date of Birth: Sep 18, 1970           MRN: [id number] Visit Date: 05/29/2023 Requested by: Annette Drafts, MD [address] [place],  [place] [address] PCP: Annette Drafts, MD  Subjective: Chief Complaint  Patient presents with   Right Wrist - Pain    HPI: Annette Chang is a 52 y.o. female who presents to the office reporting right wrist pain.  She reports decreased strength and decreased grip.  Had an injection in that first dorsal compartment on 2 occasions.  First injection helped 3 months the second injection helped only 3 weeks.  Wrist splint does help.  She is dropping things.  MRI scan does show significant first dorsal compartment tenosynovitis..                ROS: All systems reviewed are negative as they relate to the chief complaint within the history of present illness.  Patient denies fevers or chills.  Assessment & Plan: Visit Diagnoses:  1. De Quervain's tenosynovitis, right     Plan: Impression is refractory de Quervain's tenosynovitis right wrist.  Had a long talk with Annette Chang about operative and nonoperative treatment options.  The risk and benefits of surgery in her case would be primarily with wound healing.  I do think that this procedure has a reasonable chance of improving her quality of life with use of the right hand.  She has had good results but temporarily with injections.  Patient understands risk and benefits including not limited to infection tendon instability and incomplete pain relief as result of the procedure.  Patient understands and wishes to proceed.  All questions answered  Follow-Up Instructions: No follow-ups on file.   Orders:  No orders of the defined types were placed in this encounter.  No orders of the defined types were placed in this encounter.     Procedures: No procedures performed   Clinical Data: No additional findings.  Objective: Vital Signs: There were no  vitals taken for this visit.  Physical Exam:  Constitutional: Patient appears well-developed HEENT:  Head: Normocephalic Eyes:EOM are normal Neck: Normal range of motion Cardiovascular: Normal rate Pulmonary/chest: Effort normal Neurologic: Patient is alert Skin: Skin is warm Psychiatric: Patient has normal mood and affect  Ortho Exam: Ortho exam demonstrates intact EPL FPL interosseous function.  Positive Finkelstein's test on the right.  Does have tenderness over the radial styloid.  Radial pulse intact.  Wrist range of motion intact.  Negative Tinel's cubital tunnel in the elbow.  Specialty Comments:  06/04/2021 Impression: Essentially NORMAL electrodiagnostic study of the left upper limb.  There is no significant electrodiagnostic evidence of nerve entrapment, brachial plexopathy or cervical radiculopathy.  Specifically no evidence of axonal or demyelinating long thoracic nerve neuropathy although this particular testing is fraught with technical difficulties.  No differences when compared to the left upper extremity study of 2020 although again that study did not specifically look at the long thoracic nerve.     As you know, purely sensory or demyelinating radiculopathies and chemical radiculitis may not be detected with this particular electrodiagnostic study.   Recommendations: 1.  Follow-up with referring physician. 2.  Continue current management of symptoms.   ___________________________ Naaman Plummer FAAPMR ------ 11/29/2018 Impression: Essentially NORMAL electrodiagnostic study of the left upper limb.  There is no significant electrodiagnostic evidence of nerve entrapment, brachial plexopathy or cervical  radiculopathy.     As you know, purely sensory or demyelinating radiculopathies and chemical radiculitis may not be detected with this particular electrodiagnostic study.   Also this would not detect a purely demyelinated specific brachial plexus issue.    Recommendations: 1.  Follow-up with referring physician. 2.  Continue current management of symptoms.   ___________________________ Naaman Plummer FAAPMR ----- Nerve conduction studies IMPRESSION:   Nerve conduction studies done on the right upper extremity were unremarkable, without evidence of a peripheral neuropathy. 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 No evidence of a cervical radiculopathy was seen.   Marlan Palau MD 11/06/2014 11:09 AM  Imaging: No results found.   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   Chronic urticaria 08/06/2015   H/O rotator cuff surgery 01/02/2012   Arthritis of shoulder region, 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   Iron deficiency anemia 01/02/2012   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   Blood transfusion without reported diagnosis    Depression    H/O rotator cuff surgery 01/02/2012   H/O tubal ligation 01/02/2012   H/O: C-section 01/02/2012   3    H/O: C-section 01/02/2012   3    Headache(784.0) 01/02/2012   Iron deficiency anemia 01/02/2012   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   Suicidal ideation 07/27/2022    Family History  Problem Relation Age of Onset   Diabetes Maternal Grandmother    Diabetes Paternal Grandmother    Diabetes Paternal Grandfather    Colon cancer Neg Hx    Esophageal cancer Neg Hx     Rectal cancer Neg Hx    Stomach cancer Neg Hx     Past Surgical History:  Procedure Laterality Date   CESAREAN SECTION     3 previous c sections   EYE SURGERY Bilateral    MUSCLE REPAIR Right 01/10/2017   RIGHT PECTORAL MAJOR TO SCAPULA MUSCLE TRANSFER (Right   NOVASURE ABLATION N/A 03/11/2014   Procedure: NOVASURE ABLATION;  Surgeon: Allie Bossier, MD;  Location: [facility];  Service: Gynecology;  Laterality: N/A;   PECTORALIS TENDON REPAIR Right 01/10/2017   Procedure: RIGHT PECTORAL MAJOR TO SCAPULA MUSCLE TRANSFER;  Surgeon: Cammy Copa, MD;  Location: [facility] OR;  Service: Orthopedics;  Laterality: Right;   PECTORALIS TENDON REPAIR Right 12/28/2017   Procedure: RIGHT REVISION TENDON TRANSFER OF PECTORALIS MAJOR;  Surgeon: Cammy Copa, MD;  Location: [facility] OR;  Service: Orthopedics;  Laterality: Right;   rotator cuff surgery     SHOULDER SURGERY     TUBAL LIGATION     Social History   Occupational History   Occupation: unemployed  Tobacco Use   Smoking status: Former    Current packs/day: 0.00    Types: Cigarettes    Start date: 11/2012    Quit date: 11/2017    Years since quitting: 5.5    Passive exposure: Past   Smokeless  tobacco: Never  Vaping Use   Vaping status: Never Used  Substance and Sexual Activity   Alcohol use: Yes    Alcohol/week: 18.0 standard drinks of alcohol    Types: 18 Cans of beer per week    Comment: three times a week drink 6 beers in one sitting   Drug use: Yes    Types: Cocaine, Marijuana    Comment: marijuana used 3 times a weekly   Sexual activity: Not Currently    Birth control/protection: Surgical

## 2023-06-01 NOTE — Progress Notes (Deleted)
    SUBJECTIVE:   CHIEF COMPLAINT / HPI:   Here for follow-up of falls, MDD, anxiety  Seen 9/4 and 9/17, advised to stop Zoloft and started Wellbutrin MRI 10/4 showed no acute intracranial abnormality though it did show some nonspecific signal abnormalities that could represent chronic small vessel disease, chronic migraines, sequela of inflammatory/infectious process, or sequelae of demyelinating disease Advised to avoid Atarax and trazodone as possible  Recently saw Ortho, underwent procedure for de Quervain's tenosynovitis*** ***  PERTINENT  PMH / PSH: ***  OBJECTIVE:   There were no vitals taken for this visit.  ***  ASSESSMENT/PLAN:   Assessment & Plan    Vonna Drafts, MD [facility]

## 2023-06-02 ENCOUNTER — Ambulatory Visit: Payer: Medicaid Other | Admitting: Family Medicine

## 2023-06-05 ENCOUNTER — Telehealth: Payer: Self-pay

## 2023-06-05 NOTE — Telephone Encounter (Signed)
Patient called triage phone. She states that she is scheduled for surgery this Thursday but has not been given a time or any details. She states that she has tried calling multiple times but not receiving a phone call back. Call back #[phone]

## 2023-06-07 ENCOUNTER — Encounter (HOSPITAL_COMMUNITY): Payer: Self-pay | Admitting: Orthopedic Surgery

## 2023-06-07 NOTE — Progress Notes (Signed)
Multiple attempts to try to reach patient for SDW.  Her neighbor, Harriette Ohara states her Annette Chang will try to track her down.  She states patient told her the surgery was 06/14/2023. At this time am unable to make contact with patient for arrival time, location, medications, or hygiene. Spoke to Chrystine Oiler, RN at Dr. Diamantina [facility] off and she has also been trying to make contact with patient.

## 2023-06-07 NOTE — Progress Notes (Signed)
Unable to reach patient using various phone numbers listed under contacts.  I was able to reach patient's Mother Annette Chang who stated that patient says her surgery is next week.  Asked patient's mom to give patient a message to call Dr Diamantina [facility] office to reschedule her surgery.  Notified Debbie at MD's office of the above information.  Debbie to cancel surgery in EPIC.   PCP - Dr Vonna Drafts Cardiologist - none  Chest x-ray - 07/27/22 EKG - 07/27/22 Stress Test - n/a ECHO - n/a Cardiac Cath - n/a  ICD Pacemaker/Loop - n/a  Sleep Study -  n/a  Diabetes - n/a  STOP now taking any Aspirin (unless otherwise instructed by your surgeon), Aleve, Naproxen, Ibuprofen, Motrin, Advil, Goody's, BC's, all herbal medications, fish oil, and all vitamins.

## 2023-06-08 ENCOUNTER — Ambulatory Visit (HOSPITAL_COMMUNITY): Admission: RE | Admit: 2023-06-08 | Payer: Medicaid Other | Source: Home / Self Care | Admitting: Orthopedic Surgery

## 2023-06-08 ENCOUNTER — Encounter (HOSPITAL_COMMUNITY): Admission: RE | Payer: Self-pay | Source: Home / Self Care

## 2023-06-08 SURGERY — RELEASE, FIRST DORSAL COMPARTMENT, HAND
Anesthesia: Regional | Laterality: Right

## 2023-06-15 ENCOUNTER — Other Ambulatory Visit: Payer: Self-pay

## 2023-06-15 ENCOUNTER — Encounter (HOSPITAL_COMMUNITY): Payer: Self-pay | Admitting: Orthopedic Surgery

## 2023-06-15 NOTE — Progress Notes (Addendum)
PCP - Dr. Phineas Real ([facility]) Cardiologist - denies  PPM/ICD - denies Device Orders - n/a Rep Notified - n/a  Chest x-ray - 07-27-22 EKG - 07-27-22 Stress Test -  ECHO -  Cardiac Cath -   CPAP - denies  DM -denies  Blood Thinner Instructions: denies Aspirin Instructions: n/a  ERAS Protcol - clear liquids until 12:50  pm  COVID TEST- no  Anesthesia review: no Patient verbally denies any shortness of breath, fever, cough and chest pain during phone call   -------------  SDW INSTRUCTIONS given:  Your procedure is scheduled on June 20, 2023.  Report to [facility] Main Entrance "A" at 1:20 PM., and check in at the Admitting office.  Call this number if you have problems the morning of surgery:  [phone]   Remember:  Do not eat after midnight the night before your surgery  You may drink clear liquids until 1250 PM the morning of your surgery.   Clear liquids allowed are: Water, Non-Citrus Juices (without pulp), Carbonated Beverages, Clear Tea, Black Coffee Only, and Gatorade    Take these medicines the morning of surgery with A SIP OF WATER  buPROPion (WELLBUTRIN XL)  gabapentin (NEURONTIN)  ketorolac (ACULAR)  methocarbamol (ROBAXIN)  omeprazole (PRILOSEC)    As of today, STOP taking any Aspirin (unless otherwise instructed by your surgeon) Aleve, Naproxen, Ibuprofen, Motrin, Advil, Goody's, BC's, all herbal medications, fish oil, and all vitamins.                      Do not wear jewelry, make up, or nail polish            Do not wear lotions, powders, perfumes/colognes, or deodorant.            Do not shave 48 hours prior to surgery.  Men may shave face and neck.            Do not bring valuables to the hospital.            [facility] is not responsible for any belongings or valuables.  Do NOT Smoke (Tobacco/Vaping) 24 hours prior to your procedure If you use a CPAP at night, you may bring all equipment for your overnight stay.   Contacts,  glasses, dentures or bridgework may not be worn into surgery.      For patients admitted to the hospital, discharge time will be determined by your treatment team.   Patients discharged the day of surgery will not be allowed to drive home, and someone needs to stay with them for 24 hours.    Special instructions:   [facility]- Preparing For Surgery  Before surgery, you can play an important role. Because skin is not sterile, your skin needs to be as free of germs as possible. You can reduce the number of germs on your skin by washing with CHG (chlorahexidine gluconate) Soap before surgery.  CHG is an antiseptic cleaner which kills germs and bonds with the skin to continue killing germs even after washing.    Oral Hygiene is also important to reduce your risk of infection.  Remember - BRUSH YOUR TEETH THE MORNING OF SURGERY WITH YOUR REGULAR TOOTHPASTE  Please do not use if you have an allergy to CHG or antibacterial soaps. If your skin becomes reddened/irritated stop using the CHG.  Do not shave (including legs and underarms) for at least 48 hours prior to first CHG shower. It is OK to shave  your face.  Please follow these instructions carefully.   Shower the NIGHT BEFORE SURGERY and the MORNING OF SURGERY with DIAL Soap.   Pat yourself dry with a CLEAN TOWEL.  Wear CLEAN PAJAMAS to bed the night before surgery  Place CLEAN SHEETS on your bed the night of your first shower and DO NOT SLEEP WITH PETS.   Day of Surgery: Please shower morning of surgery  Wear Clean/Comfortable clothing the morning of surgery Do not apply any deodorants/lotions.   Remember to brush your teeth WITH YOUR REGULAR TOOTHPASTE.   Questions were answered. Patient verbalized understanding of instructions.

## 2023-06-19 ENCOUNTER — Other Ambulatory Visit: Payer: Self-pay | Admitting: Family Medicine

## 2023-06-19 ENCOUNTER — Encounter: Payer: Medicaid Other | Admitting: Surgical

## 2023-06-20 ENCOUNTER — Ambulatory Visit (HOSPITAL_COMMUNITY): Payer: Medicaid Other | Admitting: Anesthesiology

## 2023-06-20 ENCOUNTER — Other Ambulatory Visit: Payer: Self-pay

## 2023-06-20 ENCOUNTER — Other Ambulatory Visit (HOSPITAL_COMMUNITY): Payer: Self-pay

## 2023-06-20 ENCOUNTER — Encounter (HOSPITAL_COMMUNITY): Payer: Self-pay | Admitting: Orthopedic Surgery

## 2023-06-20 ENCOUNTER — Ambulatory Visit (HOSPITAL_COMMUNITY)
Admission: RE | Admit: 2023-06-20 | Discharge: 2023-06-20 | Disposition: A | Discharge status: Home / Self Care | Payer: Medicaid Other | Attending: Orthopedic Surgery | Admitting: Orthopedic Surgery

## 2023-06-20 ENCOUNTER — Encounter (HOSPITAL_COMMUNITY)
Admission: RE | Disposition: A | Discharge status: Home / Self Care | Payer: Self-pay | Source: Home / Self Care | Attending: Orthopedic Surgery

## 2023-06-20 ENCOUNTER — Ambulatory Visit (HOSPITAL_BASED_OUTPATIENT_CLINIC_OR_DEPARTMENT_OTHER): Payer: Medicaid Other | Admitting: Anesthesiology

## 2023-06-20 DIAGNOSIS — M654 Radial styloid tenosynovitis [de Quervain]: Secondary | ICD-10-CM

## 2023-06-20 DIAGNOSIS — F17210 Nicotine dependence, cigarettes, uncomplicated: Secondary | ICD-10-CM | POA: Diagnosis not present

## 2023-06-20 DIAGNOSIS — Z01818 Encounter for other preprocedural examination: Secondary | ICD-10-CM

## 2023-06-20 HISTORY — PX: DORSAL COMPARTMENT RELEASE: SHX5039

## 2023-06-20 LAB — CBC
HCT: 39.5 % (ref 36.0–46.0)
Hemoglobin: 13.3 g/dL (ref 12.0–15.0)
MCH: 31 pg (ref 26.0–34.0)
MCHC: 33.7 g/dL (ref 30.0–36.0)
MCV: 92.1 fL (ref 80.0–100.0)
Platelets: 199 10*3/uL (ref 150–400)
RBC: 4.29 MIL/uL (ref 3.87–5.11)
RDW: 12.9 % (ref 11.5–15.5)
WBC: 4.4 10*3/uL (ref 4.0–10.5)
nRBC: 0 % (ref 0.0–0.2)

## 2023-06-20 LAB — COMPREHENSIVE METABOLIC PANEL
ALT: 15 U/L (ref 0–44)
AST: 26 U/L (ref 15–41)
Albumin: 3.9 g/dL (ref 3.5–5.0)
Alkaline Phosphatase: 53 U/L (ref 38–126)
Anion gap: 11 (ref 5–15)
BUN: 8 mg/dL (ref 6–20)
CO2: 22 mmol/L (ref 22–32)
Calcium: 9.2 mg/dL (ref 8.9–10.3)
Chloride: 108 mmol/L (ref 98–111)
Creatinine, Ser: 0.5 mg/dL (ref 0.44–1.00)
GFR, Estimated: >60 mL/min (ref >60)
Glucose, Bld: 80 mg/dL (ref 70–99)
Potassium: 4 mmol/L (ref 3.5–5.1)
Sodium: 141 mmol/L (ref 135–145)
Total Bilirubin: 0.9 mg/dL (ref <1.2)
Total Protein: 7 g/dL (ref 6.5–8.1)

## 2023-06-20 SURGERY — RELEASE, FIRST DORSAL COMPARTMENT, HAND
Anesthesia: General | Laterality: Right

## 2023-06-20 MED ORDER — PROPOFOL 10 MG/ML IV BOLUS
INTRAVENOUS | Status: AC
  Filled 2023-06-20: qty 20

## 2023-06-20 MED ORDER — DEXAMETHASONE SODIUM PHOSPHATE 10 MG/ML IJ SOLN
INTRAMUSCULAR | Status: DC | PRN
  Administered 2023-06-20: 5 mg via INTRAVENOUS

## 2023-06-20 MED ORDER — MIDAZOLAM HCL 2 MG/2ML IJ SOLN
INTRAMUSCULAR | Status: DC | PRN
  Administered 2023-06-20: 2 mg via INTRAVENOUS

## 2023-06-20 MED ORDER — CEFAZOLIN SODIUM-DEXTROSE 2-4 GM/100ML-% IV SOLN
2.0000 g | INTRAVENOUS | Status: AC
  Administered 2023-06-20: 2 g via INTRAVENOUS
  Filled 2023-06-20: qty 100

## 2023-06-20 MED ORDER — ORAL CARE MOUTH RINSE: 15.0000 mL | Freq: Once | OROMUCOSAL | Status: AC

## 2023-06-20 MED ORDER — HYDROCODONE-ACETAMINOPHEN 5-325 MG PO TABS
1.0000 | ORAL_TABLET | ORAL | 0 refills | Status: DC | PRN
Start: 2023-06-20 — End: 2023-08-08
  Filled 2023-06-20: qty 20, 4d supply, fill #0

## 2023-06-20 MED ORDER — PROPOFOL 10 MG/ML IV BOLUS
INTRAVENOUS | Status: DC | PRN
  Administered 2023-06-20: 130 mg via INTRAVENOUS

## 2023-06-20 MED ORDER — POVIDONE-IODINE 10 % EX SWAB
2.0000 | Freq: Once | CUTANEOUS | Status: AC
  Administered 2023-06-20: 2 via TOPICAL

## 2023-06-20 MED ORDER — FENTANYL CITRATE (PF) 250 MCG/5ML IJ SOLN
INTRAMUSCULAR | Status: DC | PRN
  Administered 2023-06-20 (×2): 50 ug via INTRAVENOUS

## 2023-06-20 MED ORDER — LACTATED RINGERS IV SOLN: INTRAVENOUS | Status: DC

## 2023-06-20 MED ORDER — PHENYLEPHRINE HCL-NACL 20-0.9 MG/250ML-% IV SOLN
INTRAVENOUS | Status: DC | PRN
  Administered 2023-06-20: 40 ug/min via INTRAVENOUS

## 2023-06-20 MED ORDER — CLONIDINE HCL (ANALGESIA) 100 MCG/ML EP SOLN
EPIDURAL | Status: AC
  Filled 2023-06-20: qty 10

## 2023-06-20 MED ORDER — 0.9 % SODIUM CHLORIDE (POUR BTL) OPTIME
TOPICAL | Status: DC | PRN
  Administered 2023-06-20: 1000 mL

## 2023-06-20 MED ORDER — ACETAMINOPHEN 500 MG PO TABS
1000.0000 mg | ORAL_TABLET | Freq: Once | ORAL | Status: AC
  Administered 2023-06-20: 1000 mg via ORAL
  Filled 2023-06-20: qty 2

## 2023-06-20 MED ORDER — MORPHINE SULFATE (PF) 4 MG/ML IV SOLN
INTRAVENOUS | Status: DC | PRN
  Administered 2023-06-20: 7 mL via INTRAMUSCULAR

## 2023-06-20 MED ORDER — PHENYLEPHRINE 80 MCG/ML (10ML) SYRINGE FOR IV PUSH (FOR BLOOD PRESSURE SUPPORT)
PREFILLED_SYRINGE | INTRAVENOUS | Status: DC | PRN
  Administered 2023-06-20 (×2): 80 ug via INTRAVENOUS

## 2023-06-20 MED ORDER — LIDOCAINE 2% (20 MG/ML) 5 ML SYRINGE
INTRAMUSCULAR | Status: DC | PRN
  Administered 2023-06-20: 40 mg via INTRAVENOUS

## 2023-06-20 MED ORDER — MIDAZOLAM HCL 2 MG/2ML IJ SOLN
INTRAMUSCULAR | Status: AC
  Filled 2023-06-20: qty 2

## 2023-06-20 MED ORDER — BUPIVACAINE HCL (PF) 0.5 % IJ SOLN
INTRAMUSCULAR | Status: AC
  Filled 2023-06-20: qty 30

## 2023-06-20 MED ORDER — FENTANYL CITRATE (PF) 250 MCG/5ML IJ SOLN
INTRAMUSCULAR | Status: AC
  Filled 2023-06-20: qty 5

## 2023-06-20 MED ORDER — POVIDONE-IODINE 7.5 % EX SOLN
Freq: Once | CUTANEOUS | Status: DC
  Filled 2023-06-20: qty 118

## 2023-06-20 MED ORDER — ONDANSETRON HCL 4 MG/2ML IJ SOLN
INTRAMUSCULAR | Status: DC | PRN
  Administered 2023-06-20: 4 mg via INTRAVENOUS

## 2023-06-20 MED ORDER — CHLORHEXIDINE GLUCONATE 0.12 % MT SOLN: 15.0000 mL | Freq: Once | OROMUCOSAL | Status: AC

## 2023-06-20 MED ORDER — CHLORHEXIDINE GLUCONATE 0.12 % MT SOLN
OROMUCOSAL | Status: AC
  Administered 2023-06-20: 15 mL via OROMUCOSAL
  Filled 2023-06-20: qty 15

## 2023-06-20 MED ORDER — EPHEDRINE SULFATE-NACL 50-0.9 MG/10ML-% IV SOSY
PREFILLED_SYRINGE | INTRAVENOUS | Status: DC | PRN
Start: 2023-06-20 — End: 2023-06-20
  Administered 2023-06-20 (×2): 5 mg via INTRAVENOUS

## 2023-06-20 MED ORDER — MORPHINE SULFATE (PF) 4 MG/ML IV SOLN
INTRAVENOUS | Status: AC
  Filled 2023-06-20: qty 1

## 2023-06-20 SURGICAL SUPPLY — 45 items
BAG COUNTER SPONGE SURGICOUNT (BAG) ×2 IMPLANT
BNDG ELASTIC 3INX 5YD STR LF (GAUZE/BANDAGES/DRESSINGS) ×4 IMPLANT
BNDG ELASTIC 4INX 5YD STR LF (GAUZE/BANDAGES/DRESSINGS) IMPLANT
BNDG ELASTIC 4X5.8 VLCR STR LF (GAUZE/BANDAGES/DRESSINGS) ×2 IMPLANT
BNDG ESMARK 4X9 LF (GAUZE/BANDAGES/DRESSINGS) IMPLANT
BNDG GAUZE DERMACEA FLUFF 4 (GAUZE/BANDAGES/DRESSINGS) ×6 IMPLANT
CORD BIPOLAR FORCEPS 12FT (ELECTRODE) ×2 IMPLANT
COVER SURGICAL LIGHT HANDLE (MISCELLANEOUS) ×2 IMPLANT
CUFF TOURN SGL QUICK 18X4 (TOURNIQUET CUFF) ×2 IMPLANT
CUFF TRNQT CYL 24X4X16.5-23 (TOURNIQUET CUFF) IMPLANT
DRAPE SURG 17X23 STRL (DRAPES) ×2 IMPLANT
DRSG TEGADERM 4X4.75 (GAUZE/BANDAGES/DRESSINGS) IMPLANT
DRSG XEROFORM 1X8 (GAUZE/BANDAGES/DRESSINGS) IMPLANT
GAUZE PAD ABD 8X10 STRL (GAUZE/BANDAGES/DRESSINGS) IMPLANT
GAUZE SPONGE 4X4 12PLY STRL (GAUZE/BANDAGES/DRESSINGS) ×2 IMPLANT
GAUZE XEROFORM 1X8 LF (GAUZE/BANDAGES/DRESSINGS) ×2 IMPLANT
GLOVE BIOGEL M STRL SZ7.5 (GLOVE) ×2 IMPLANT
GLOVE SS BIOGEL STRL SZ 8 (GLOVE) ×2 IMPLANT
GOWN STRL REUS W/ TWL LRG LVL3 (GOWN DISPOSABLE) ×2 IMPLANT
GOWN STRL REUS W/ TWL XL LVL3 (GOWN DISPOSABLE) ×4 IMPLANT
KIT BASIN OR (CUSTOM PROCEDURE TRAY) ×2 IMPLANT
KIT TURNOVER KIT B (KITS) ×2 IMPLANT
LOOP VASCLR MAXI BLUE 18IN ST (MISCELLANEOUS) IMPLANT
NDL HYPO 25GX1X1/2 BEV (NEEDLE) IMPLANT
NEEDLE HYPO 25GX1X1/2 BEV (NEEDLE) ×1
NS IRRIG 1000ML POUR BTL (IV SOLUTION) ×2 IMPLANT
PACK ORTHO EXTREMITY (CUSTOM PROCEDURE TRAY) ×2 IMPLANT
PAD ARMBOARD 7.5X6 YLW CONV (MISCELLANEOUS) ×4 IMPLANT
PAD CAST 4YDX4 CTTN HI CHSV (CAST SUPPLIES) ×4 IMPLANT
SOL PREP POV-IOD 4OZ 10% (MISCELLANEOUS) ×6 IMPLANT
SPLINT PLASTER CAST XFAST 4X15 (CAST SUPPLIES) IMPLANT
STRIP CLOSURE SKIN 1/2X4 (GAUZE/BANDAGES/DRESSINGS) IMPLANT
SUT MNCRL AB 3-0 PS2 27 (SUTURE) IMPLANT
SUT PROLENE 4 0 PS 2 18 (SUTURE) ×2 IMPLANT
SUT VIC AB 2-0 CT1 TAPERPNT 27 (SUTURE) IMPLANT
SUT VIC AB 3-0 FS2 27 (SUTURE) IMPLANT
SUT VIC AB 3-0 SH 27X BRD (SUTURE) IMPLANT
SYR CONTROL 10ML LL (SYRINGE) IMPLANT
SYSTEM CHEST DRAIN TLS 7FR (DRAIN) IMPLANT
TOWEL GREEN STERILE (TOWEL DISPOSABLE) ×2 IMPLANT
TOWEL GREEN STERILE FF (TOWEL DISPOSABLE) ×2 IMPLANT
TUBE CONNECTING 12X1/4 (SUCTIONS) IMPLANT
TUBE EVACUATION TLS (MISCELLANEOUS) ×2 IMPLANT
UNDERPAD 30X36 HEAVY ABSORB (UNDERPADS AND DIAPERS) ×2 IMPLANT
VASCULAR TIE MAXI BLUE 18IN ST (MISCELLANEOUS)

## 2023-06-20 NOTE — Anesthesia Preprocedure Evaluation (Signed)
Anesthesia Evaluation  Patient identified by MRN, date of birth, ID band Patient awake    Reviewed: Allergy & Precautions, NPO status , Patient's Chart, lab work & pertinent test results  Airway Mallampati: II  TM Distance: >3 FB Neck ROM: Full    Dental  (+) Dental Advisory Given   Pulmonary Current Smoker and Patient abstained from smoking.   breath sounds clear to auscultation       Cardiovascular negative cardio ROS  Rhythm:Regular Rate:Normal     Neuro/Psych  Neuromuscular disease    GI/Hepatic negative GI ROS, Neg liver ROS,,,  Endo/Other  negative endocrine ROS    Renal/GU negative Renal ROS     Musculoskeletal  (+) Arthritis ,    Abdominal   Peds  Hematology negative hematology ROS (+)   Anesthesia Other Findings   Reproductive/Obstetrics                             Anesthesia Physical Anesthesia Plan  ASA: 2  Anesthesia Plan: General   Post-op Pain Management: Tylenol PO (pre-op)*   Induction:   PONV Risk Score and Plan: 2 and Dexamethasone, Ondansetron and Treatment may vary due to age or medical condition  Airway Management Planned: LMA  Additional Equipment: None  Intra-op Plan:   Post-operative Plan: Extubation in OR  Informed Consent: I have reviewed the patients History and Physical, chart, labs and discussed the procedure including the risks, benefits and alternatives for the proposed anesthesia with the patient or authorized representative who has indicated his/her understanding and acceptance.     Dental advisory given  Plan Discussed with: CRNA  Anesthesia Plan Comments:        Anesthesia Quick Evaluation

## 2023-06-20 NOTE — Transfer of Care (Signed)
Immediate Anesthesia Transfer of Care Note  Patient: Annette Chang  Procedure(s) Performed: RIGHT FIRST DORSAL COMPARTMENT (DEQUERVAINS) (Right)  Patient Location: PACU  Anesthesia Type:General  Level of Consciousness: sedated  Airway & Oxygen Therapy: Patient Spontanous Breathing and Patient connected to face mask oxygen  Post-op Assessment: Report given to RN and Post -op Vital signs reviewed and stable  Post vital signs: Reviewed and stable  Last Vitals:  Vitals Value Taken Time  BP 134/82 06/20/23 1646  Temp    Pulse 55 06/20/23 1646  Resp 11 06/20/23 1648  SpO2 100 % 06/20/23 1646  Vitals shown include unfiled device data.  Last Pain:  Vitals:   06/20/23 1319  TempSrc:   PainSc: 0-No pain         Complications: No notable events documented.

## 2023-06-20 NOTE — H&P (Signed)
Annette Chang is an 52 y.o. female.   Chief Complaint: Right wrist pain HPI:  Annette Chang is a 52 y.o. female who presents to the office reporting right wrist pain.  She reports decreased strength and decreased grip.  Had an injection in that first dorsal compartment on 2 occasions.  First injection helped 3 months the second injection helped only 3 weeks.  Wrist splint does help.  She is dropping things.  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   Psoriasis 03/17/2022   Suicidal ideation 07/27/2022    Past Surgical History:  Procedure Laterality Date   CESAREAN SECTION     3 previous c sections   COLONOSCOPY  05/15/2020   EYE SURGERY Bilateral    MRI     x several   MUSCLE REPAIR Right 01/10/2017   RIGHT PECTORAL MAJOR TO SCAPULA MUSCLE TRANSFER (Right   NOVASURE ABLATION N/A 03/11/2014   Procedure: NOVASURE ABLATION;  Surgeon: Allie Bossier, MD;  Location: [facility];  Service: Gynecology;  Laterality: N/A;   PECTORALIS TENDON REPAIR Right 01/10/2017   Procedure: RIGHT PECTORAL MAJOR TO SCAPULA MUSCLE TRANSFER;  Surgeon: Cammy Copa, MD;  Location: [facility] OR;  Service: Orthopedics;  Laterality: Right;   PECTORALIS TENDON REPAIR Right 12/28/2017   Procedure: RIGHT REVISION TENDON TRANSFER OF PECTORALIS MAJOR;  Surgeon: Cammy Copa, MD;  Location: [facility] OR;  Service: Orthopedics;  Laterality: Right;   rotator cuff surgery     SHOULDER  SURGERY     TUBAL LIGATION      Family History  Problem Relation Age of Onset   Diabetes Maternal Grandmother    Diabetes Paternal Grandmother    Diabetes Paternal Grandfather    Colon cancer Neg Hx    Esophageal cancer Neg Hx    Rectal cancer Neg Hx    Stomach cancer Neg Hx    Social History:  reports that she has been smoking cigarettes. She started smoking about 10 years ago. She has been exposed to tobacco smoke. She has never used smokeless tobacco. She reports current alcohol use of about 18.0 standard drinks of alcohol per week. She reports current drug use. Drugs: Cocaine and Marijuana.  Allergies:  Allergies  Allergen Reactions   Aleve [Naproxen Sodium] Hives    Pt states aleve and ibuprofen caused a reaction   Percocet [Oxycodone-Acetaminophen] Itching   Robaxin [Methocarbamol] Rash    Denies current allergy    Medications Prior to Admission  Medication Sig Dispense Refill   alendronate (FOSAMAX) 70 MG tablet TAKE ONE TABLET BY MOUTH EVERY 7 DAYS with a full GLASS of water ON an empty stomach. (Patient taking differently: Take 70 mg by mouth every Thursday.) 4 tablet 4   buPROPion (WELLBUTRIN XL) 150 MG 24 hr tablet Take 1 Tablet by mouth once daily 30 tablet 1   gabapentin (NEURONTIN) 300 MG capsule Take 1 capsule (300 mg total)  by mouth 3 (three) times daily. (Patient taking differently: Take 300 mg by mouth 3 (three) times daily as needed (pain.).) 90 capsule 1   ketorolac (ACULAR) 0.4 % SOLN Place 1 drop into the left eye daily as needed (pain).     methocarbamol (ROBAXIN) 500 MG tablet TAKE ONE TABLET BY MOUTH every 8 HOURS AS NEEDED FOR muscle SPASMS 30 tablet 1   Multiple Vitamins-Minerals (MULTIVITAMIN GUMMIES WOMENS) CHEW Chew 1 tablet by mouth in the morning.     omeprazole (PRILOSEC) 20 MG capsule TAKE ONE CAPSULE BY MOUTH EVERY DAY 30 capsule PRN   traMADol (ULTRAM) 50 MG tablet Take 50 mg by mouth daily as needed (pain.).     traZODone (DESYREL) 50 MG tablet  TAKE 1 Tablet BY MOUTH ONCE DAILY at bedtime AS NEEDED FOR SLEEP 30 tablet 0   Apremilast (OTEZLA) 30 MG TABS Take 1 tablet (30 mg total) by mouth 2 (two) times daily. (Patient not taking: Reported on 04/11/2023) 28 tablet 12   nicotine polacrilex (RA NICOTINE GUM) 2 MG gum Take 1 each (2 mg total) by mouth every hour as needed for smoking cessation. No more than 24 pieces per day. 110 each 6    No results found for this or any previous visit (from the past 48 hour(s)). No results found.  Review of Systems  Musculoskeletal:  Positive for arthralgias.  All other systems reviewed and are negative.   Blood pressure 116/81, pulse 69, temperature 98.1 F (36.7 C), temperature source Oral, resp. rate 18, height 5\' 1"  (1.549 m), weight 45.4 kg, SpO2 96%. Physical Exam Vitals reviewed.  HENT:     Head: Normocephalic.     Nose: Nose normal.     Mouth/Throat:     Mouth: Mucous membranes are moist.  Eyes:     Conjunctiva/sclera: Conjunctivae normal.  Cardiovascular:     Rate and Rhythm: Normal rate.     Pulses: Normal pulses.  Pulmonary:     Effort: Pulmonary effort is normal.  Abdominal:     General: Abdomen is flat.  Musculoskeletal:     Cervical back: Normal range of motion.  Skin:    General: Skin is warm.     Capillary Refill: Capillary refill takes less than 2 seconds.  Neurological:     General: No focal deficit present.     Mental Status: She is alert.  Psychiatric:        Mood and Affect: Mood normal.     Ortho exam demonstrates intact EPL FPL interosseous function.  Positive Finkelstein's test on the right.  Does have tenderness over the radial styloid.  Radial pulse intact.  Wrist range of motion intact.  Negative Tinel's cubital tunnel in the elbow.   Assessment/Plan  Impression is refractory de Quervain's tenosynovitis right wrist.  Had a long talk with [name] about operative and nonoperative treatment options.  The risk and benefits of surgery in her case would be  primarily with wound healing.  I do think that this procedure has a reasonable chance of improving her quality of life with use of the right hand.  She has had good results but temporarily with injections.  Patient understands risk and benefits including not limited to infection tendon instability and incomplete pain relief as result of the procedure.  Patient understands and wishes to proceed.  All questions answered   Burnard Bunting, MD 06/20/2023, 1:22 PM

## 2023-06-20 NOTE — Brief Op Note (Signed)
   06/20/2023  4:44 PM  PATIENT:  Annette Chang  52 y.o. female  PRE-OPERATIVE DIAGNOSIS:  right dequervains tenosynovitis  POST-OPERATIVE DIAGNOSIS:  right dequervains tenosynovitis  PROCEDURE:  Procedure(s): RIGHT FIRST DORSAL COMPARTMENT (DEQUERVAINS)  SURGEON:  Surgeon(s): August Saucer, Corrie Mckusick, MD  ASSISTANT: magnant pa  ANESTHESIA:   general  EBL: 3 ml    Total I/O In: 600 [I.V.:500; IV Piggyback:100] Out: -   BLOOD ADMINISTERED: none  DRAINS: none   LOCAL MEDICATIONS USED:  marcaine morphine sulfate clonidine  SPECIMEN:  No Specimen  COUNTS:  YES  TOURNIQUET:   Total Tourniquet Time Documented: Forearm (Right) - 14 minutes Total: Forearm (Right) - 14 minutes   DICTATION: .Other Dictation: Dictation Number [id number]  PLAN OF CARE: Discharge to home after PACU  PATIENT DISPOSITION:  PACU - hemodynamically stable

## 2023-06-20 NOTE — Anesthesia Procedure Notes (Signed)
Procedure Name: LMA Insertion Date/Time: 06/20/2023 3:53 PM  Performed by: Loleta Demian Maisel, CRNAPre-anesthesia Checklist: Patient identified, Patient being monitored, Timeout performed, Emergency Drugs available and Suction available Patient Re-evaluated:Patient Re-evaluated prior to induction Oxygen Delivery Method: Circle system utilized Preoxygenation: Pre-oxygenation with 100% oxygen Induction Type: IV induction Ventilation: Mask ventilation without difficulty LMA: LMA inserted LMA Size: 3.0 Tube type: Oral Number of attempts: 1 Placement Confirmation: positive ETCO2 and breath sounds checked- equal and bilateral Tube secured with: Tape Dental Injury: Teeth and Oropharynx as per pre-operative assessment

## 2023-06-20 NOTE — Op Note (Unsigned)
NAMEAVICE, KITTLESON MEDICAL RECORD NO: [id number] ACCOUNT NO: [id number] DATE OF BIRTH: [birth year] FACILITY: [facility] LOCATION: [facility] PHYSICIAN: Graylin Shiver. August Saucer, MD  Operative Report   DATE OF PROCEDURE: 06/20/2023  PREOPERATIVE DIAGNOSIS:  Right wrist de Quervain's tenosynovitis.  POSTOPERATIVE DIAGNOSIS:  Right wrist de Quervain's tenosynovitis.  PROCEDURE:  Right wrist de Quervain's tenosynovitis release.  SURGEON:  Graylin Shiver. August Saucer, MD  ASSISTANT:  Karenann Cai, PA  INDICATIONS:  The patient is a 52 year old patient with refractory right wrist de Quervain's tenosynovitis who presents for operative management after explanation of the risks and benefits of the procedure in detail.  DESCRIPTION OF PROCEDURE:  The patient was brought to the operating room where general anesthetic was induced. Preoperative antibiotics administered. Timeout was called. Right wrist was prescrubbed with alcohol and Betadine allowed to air dry, prepped  with DuraPrep solution, draped in a sterile manner. The arm was elevated and examined using Esmarch wrap. Tourniquet was inflated. After calling timeout, a 2.5 cm transverse incision made about 1 cm proximal to the tip of the radial styloid. Skin and  subcutaneous tissues were sharply divided. Veins and superficial branches of the radial nerve were mobilized ulnarly and radially. The proximal aspect of the first dorsal compartment was visualized. A right angle retractor was then placed at the dorsal  aspect of the first dorsal compartment retinaculum, which was then released. All tendons pulled from the first dorsal compartment to make sure all compartments were released. At this time, thorough irrigation was performed. The wrist was taken through  flexion and extension and found to have no subluxation of the tendons. Thorough irrigation was performed. Skin edges numbed up with Marcaine, morphine, and clonidine. Tourniquet released. Bleeding points were  encountered using bipolar electrocautery.  Skin closed then using 3-0 Vicryl suture and 3-0 Monocryl suture with Steri-Strips and impervious dressing and volar splint applied. The patient tolerated the procedure well without immediate complication. Luke's assistance was required for retraction  and closing, his assistance with medical necessity.   SHY D: 06/20/2023 4:48:25 pm T: 06/20/2023 7:23:00 pm  JOB: [id number]/ [id number]

## 2023-06-21 ENCOUNTER — Encounter (HOSPITAL_COMMUNITY): Payer: Self-pay | Admitting: Orthopedic Surgery

## 2023-06-22 NOTE — Anesthesia Postprocedure Evaluation (Signed)
Anesthesia Post Note  Patient: Annette Chang  Procedure(s) Performed: RIGHT FIRST DORSAL COMPARTMENT (DEQUERVAINS) (Right)     Patient location during evaluation: PACU Anesthesia Type: General Level of consciousness: awake and alert Pain management: pain level controlled Vital Signs Assessment: post-procedure vital signs reviewed and stable Respiratory status: spontaneous breathing, nonlabored ventilation, respiratory function stable and patient connected to nasal cannula oxygen Cardiovascular status: blood pressure returned to baseline and stable Postop Assessment: no apparent nausea or vomiting Anesthetic complications: no   No notable events documented.  Last Vitals:  Vitals:   06/20/23 1700 06/20/23 1715  BP: 117/87 130/78  Pulse: (!) 58 (!) 59  Resp: 14 19  Temp:  36.6 C  SpO2: 100% 95%    Last Pain:  Vitals:   06/20/23 1715  TempSrc:   PainSc: 0-No pain                 Kennieth Rad

## 2023-06-26 DIAGNOSIS — M654 Radial styloid tenosynovitis [de Quervain]: Secondary | ICD-10-CM

## 2023-06-29 ENCOUNTER — Other Ambulatory Visit: Payer: Self-pay | Admitting: Family Medicine

## 2023-06-29 ENCOUNTER — Ambulatory Visit (INDEPENDENT_AMBULATORY_CARE_PROVIDER_SITE_OTHER): Payer: Medicaid Other | Admitting: Orthopedic Surgery

## 2023-06-29 DIAGNOSIS — M654 Radial styloid tenosynovitis [de Quervain]: Secondary | ICD-10-CM

## 2023-06-30 ENCOUNTER — Encounter: Payer: Self-pay | Admitting: Orthopedic Surgery

## 2023-06-30 NOTE — Progress Notes (Signed)
Post-Op Visit Note   Patient: Annette Chang           Date of Birth: 02/09/1971           MRN: [id number] Visit Date: 06/29/2023 PCP: Vonna Drafts, MD   Assessment & Plan:  Chief Complaint:  Chief Complaint  Patient presents with   Post-op Follow-up    RIGHT 1ST DORSAL COMPARTMENT RELEASE (surgery date 06-20-23)   Visit Diagnoses:  1. De Quervain's tenosynovitis, right     Plan: Patient presents status post right first dorsal compartment release.  On examination the incision is intact.  No paresthesias in the first webspace dorsally.  Would like for her to use a removable wrist splint for the next couple of weeks but okay to start using the hand.  4-week return for final clinical recheck.  Follow-Up Instructions: No follow-ups on file.   Orders:  No orders of the defined types were placed in this encounter.  No orders of the defined types were placed in this encounter.   Imaging: No results found.  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   Iron deficiency anemia 01/02/2012   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   Blood transfusion without reported diagnosis    Depression    Frequent falls 04/11/2023   medication related, was told to decrease sedating medications   H/O rotator cuff surgery 01/02/2012   H/O tubal ligation  01/02/2012   H/O: C-section 01/02/2012   3    H/O: C-section 01/02/2012   3    Headache(784.0) 01/02/2012   Iron deficiency anemia 01/02/2012   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   Psoriasis 03/17/2022   Suicidal ideation 07/27/2022    Family History  Problem Relation Age of Onset   Diabetes Maternal Grandmother    Diabetes Paternal Grandmother    Diabetes Paternal Grandfather    Colon cancer Neg Hx    Esophageal cancer Neg Hx    Rectal cancer Neg Hx    Stomach cancer Neg Hx     Past Surgical History:  Procedure Laterality Date   CESAREAN SECTION     3 previous c sections   COLONOSCOPY  05/15/2020   DORSAL COMPARTMENT RELEASE Right 06/20/2023   Procedure: RIGHT FIRST DORSAL COMPARTMENT (DEQUERVAINS);  Surgeon: Cammy Copa, MD;  Location: [facility] OR;  Service: Orthopedics;  Laterality: Right;   EYE SURGERY Bilateral    MRI     x several   MUSCLE REPAIR Right 01/10/2017   RIGHT PECTORAL MAJOR TO SCAPULA MUSCLE TRANSFER (Right   NOVASURE ABLATION N/A 03/11/2014   Procedure: NOVASURE ABLATION;  Surgeon: Allie Bossier, MD;  Location: [facility] OR[facility];  Service: Gynecology;  Laterality: N/A;   PECTORALIS TENDON REPAIR Right 01/10/2017   Procedure: RIGHT PECTORAL MAJOR TO SCAPULA MUSCLE TRANSFER;  Surgeon: Cammy Copa, MD;  Location: [facility] OR;  Service: Orthopedics;  Laterality: Right;   PECTORALIS TENDON REPAIR Right 12/28/2017   Procedure: RIGHT REVISION TENDON TRANSFER OF PECTORALIS MAJOR;  Surgeon: Cammy Copa, MD;  Location: [facility] OR;  Service: Orthopedics;  Laterality: Right;   rotator cuff surgery     SHOULDER SURGERY     TUBAL LIGATION     Social History   Occupational History   Occupation: unemployed  Tobacco Use   Smoking status: Some Days    Current packs/day: 0.00    Types: Cigarettes    Start date: 11/2012    Last attempt to quit: 11/2017    Years since quitting: 5.6    Passive exposure: Past   Smokeless tobacco: Never  Vaping Use   Vaping  status: Never Used  Substance and Sexual Activity   Alcohol use: Yes    Alcohol/week: 18.0 standard drinks of alcohol    Types: 18 Cans of beer per week    Comment: pt reports she currently drinks on weekends only- was drinking more in past   Drug use: Yes    Types: Cocaine, Marijuana    Comment: denies cocaine use in last 6 months as of (06/20/23)   Sexual activity: Not Currently    Birth control/protection: Surgical    Comment: tubal ligation

## 2023-07-17 ENCOUNTER — Other Ambulatory Visit: Payer: Self-pay

## 2023-07-17 ENCOUNTER — Other Ambulatory Visit: Payer: Self-pay | Admitting: Family Medicine

## 2023-07-17 ENCOUNTER — Other Ambulatory Visit: Payer: Self-pay | Admitting: Surgical

## 2023-07-17 DIAGNOSIS — M810 Age-related osteoporosis without current pathological fracture: Secondary | ICD-10-CM

## 2023-07-17 MED ORDER — ALENDRONATE SODIUM 70 MG PO TABS: ORAL_TABLET | ORAL | 4 refills | Status: DC

## 2023-07-31 ENCOUNTER — Encounter: Payer: Medicaid Other | Admitting: Orthopedic Surgery

## 2023-08-08 ENCOUNTER — Telehealth: Payer: Self-pay | Admitting: Orthopedic Surgery

## 2023-08-08 ENCOUNTER — Other Ambulatory Visit: Payer: Self-pay | Admitting: Orthopedic Surgery

## 2023-08-08 MED ORDER — HYDROCODONE-ACETAMINOPHEN 5-325 MG PO TABS: 1.0000 | ORAL_TABLET | Freq: Four times a day (QID) | ORAL | 0 refills | Status: DC | PRN

## 2023-08-08 NOTE — Telephone Encounter (Signed)
 Patient called and needed a refill on Hydrocodone. CB#[phone]

## 2023-08-08 NOTE — Telephone Encounter (Signed)
 Norco 16 sent thx

## 2023-08-15 ENCOUNTER — Other Ambulatory Visit: Payer: Self-pay | Admitting: Family Medicine

## 2023-08-16 ENCOUNTER — Ambulatory Visit: Payer: Medicaid Other | Admitting: Family Medicine

## 2023-08-16 NOTE — Progress Notes (Deleted)
    SUBJECTIVE:   CHIEF COMPLAINT / HPI:   ***  PERTINENT  PMH / PSH: ***  OBJECTIVE:   There were no vitals taken for this visit.  ***  ASSESSMENT/PLAN:   Assessment & Plan        Vonna Drafts, MD [facility]

## 2023-08-21 ENCOUNTER — Encounter: Payer: Self-pay | Admitting: Family Medicine

## 2023-08-21 ENCOUNTER — Telehealth: Payer: Self-pay | Admitting: Orthopedic Surgery

## 2023-08-21 ENCOUNTER — Ambulatory Visit: Payer: Medicaid Other | Admitting: Family Medicine

## 2023-08-21 VITALS — BP 109/81 | HR 65 | Ht 61.0 in | Wt 95.2 lb

## 2023-08-21 DIAGNOSIS — R0609 Other forms of dyspnea: Secondary | ICD-10-CM

## 2023-08-21 DIAGNOSIS — R296 Repeated falls: Secondary | ICD-10-CM

## 2023-08-21 DIAGNOSIS — R002 Palpitations: Secondary | ICD-10-CM

## 2023-08-21 NOTE — Progress Notes (Signed)
Ambulatory walk 99% oxygen 72BPM

## 2023-08-21 NOTE — Patient Instructions (Addendum)
I placed a referral for you to see a cardiologist about your symptoms.  You should receive a call about this within the next couple of weeks  I will let you know if any of your blood work performed today is abnormal  Please seek medical attention if you have any worsening shortness of breath, chest pain, passing out  Please stop taking Robaxin

## 2023-08-21 NOTE — Progress Notes (Signed)
    SUBJECTIVE:   CHIEF COMPLAINT / HPI:   Concern for feeling short of breath and lightheadedness with ambulation x a few months. No syncope/LOC or falls within the last month Walking down the hall feels short of breath Denies chest pain, N/V, fevers, illness Symptoms improve after rest She was discharged from physical therapy Sleeping well. Denies orthopnea Endorses occasional palpitations with these episodes   She is staying well nourished and hydrated   History of frequent falls Not taking robaxin or tramadaol often Occasionally uses trazodone Has not fallen in over 1 month Never was able to get rolling walker but still requesting this. Currently using cane   PERTINENT  PMH / PSH: History of frequent falls, recent surgery for de Quervain's tenosynovitis  OBJECTIVE:   BP 109/81   Pulse 65   Ht 5\' 1"  (1.549 m)   Wt 95 lb 4 oz (43.2 kg)   SpO2 100%   BMI 18.00 kg/m   General: NAD, pleasant, able to participate in exam Cardiac: RRR, no murmurs auscultated Respiratory: CTAB, normal WOB Abdomen: soft, non-tender, non-distended Extremities: warm and well perfused, no edema or cyanosis Skin: warm and dry, no rashes noted Neuro: alert, no obvious focal deficits, speech normal.  Strength 5/5 bilaterally Psych: Normal affect and mood  ASSESSMENT/PLAN:   Assessment & Plan Dyspnea on exertion Associated w/ palpitations and lightheadedness. Currently asymptomatic. Neuro exam unremarkable. Recent MRI unremarkable aside from nonspecific chronic white matter changes. Ambulatory pulse oximetry normal - maintained O2 sat 99-100%. Orthostatic vitals WNL. Recent EKG reviewed in sinus rhythm.   Placed referral to cardiology for further evaluation as she has not had cardiac workup in the past.  Will check CBC, BMP, TSH to evaluate for any hematologic, metabolic, endocrine etiology.  Follow-up 1 month or sooner as needed.  Discussed return precautions. Frequent falls History of  frequent falls and deconditioning, patient would benefit from rolling walker with seat.  Placed DME order.  Advised to DC Robaxin  At next visit advise repeat DEXA scan given history of osteoporosis, due for mammogram as well   Vonna Drafts, MD [facility]

## 2023-08-21 NOTE — Telephone Encounter (Signed)
Pt called requesting another right wrist band and need a refill of pain medication. Please send to my pharmacy. Pt phone number is [phone].

## 2023-08-22 LAB — CBC WITH DIFFERENTIAL/PLATELET
Basophils Absolute: 0 10*3/uL (ref 0.0–0.2)
Basos: 1 %
EOS (ABSOLUTE): 0.1 10*3/uL (ref 0.0–0.4)
Eos: 2 %
Hematocrit: 38.6 % (ref 34.0–46.6)
Hemoglobin: 13.2 g/dL (ref 11.1–15.9)
Immature Grans (Abs): 0 10*3/uL (ref 0.0–0.1)
Immature Granulocytes: 0 %
Lymphocytes Absolute: 1.9 10*3/uL (ref 0.7–3.1)
Lymphs: 37 %
MCH: 30.4 pg (ref 26.6–33.0)
MCHC: 34.2 g/dL (ref 31.5–35.7)
MCV: 89 fL (ref 79–97)
Monocytes Absolute: 0.4 10*3/uL (ref 0.1–0.9)
Monocytes: 8 %
Neutrophils Absolute: 2.6 10*3/uL (ref 1.4–7.0)
Neutrophils: 52 %
Platelets: 233 10*3/uL (ref 150–450)
RBC: 4.34 x10E6/uL (ref 3.77–5.28)
RDW: 12.3 % (ref 11.7–15.4)
WBC: 5.1 10*3/uL (ref 3.4–10.8)

## 2023-08-22 LAB — BASIC METABOLIC PANEL
BUN/Creatinine Ratio: 38 — ABNORMAL HIGH (ref 9–23)
BUN: 19 mg/dL (ref 6–24)
CO2: 21 mmol/L (ref 20–29)
Calcium: 9.7 mg/dL (ref 8.7–10.2)
Chloride: 105 mmol/L (ref 96–106)
Creatinine, Ser: 0.5 mg/dL — ABNORMAL LOW (ref 0.57–1.00)
Glucose: 80 mg/dL (ref 70–99)
Potassium: 3.7 mmol/L (ref 3.5–5.2)
Sodium: 139 mmol/L (ref 134–144)
eGFR: 111 mL/min/{1.73_m2} (ref >59)

## 2023-08-22 LAB — TSH RFX ON ABNORMAL TO FREE T4: TSH: 0.611 u[IU]/mL (ref 0.450–4.500)

## 2023-08-23 ENCOUNTER — Other Ambulatory Visit: Payer: Self-pay | Admitting: Surgical

## 2023-08-23 ENCOUNTER — Encounter: Payer: Self-pay | Admitting: Family Medicine

## 2023-08-23 ENCOUNTER — Telehealth: Payer: Self-pay | Admitting: Surgical

## 2023-08-23 ENCOUNTER — Other Ambulatory Visit: Payer: Self-pay | Admitting: Family Medicine

## 2023-08-23 MED ORDER — HYDROCODONE-ACETAMINOPHEN 5-325 MG PO TABS: 1.0000 | ORAL_TABLET | Freq: Four times a day (QID) | ORAL | 0 refills | Status: DC | PRN

## 2023-08-23 NOTE — Telephone Encounter (Signed)
Sent in

## 2023-08-23 NOTE — Telephone Encounter (Signed)
I called patient and advised.

## 2023-08-23 NOTE — Telephone Encounter (Signed)
Patient called advised she just took the last of her medication today. Patient asked for a call when Rx is sent to the pharmacy.  The number to contact patient is [phone]

## 2023-09-14 ENCOUNTER — Other Ambulatory Visit: Payer: Self-pay | Admitting: Family Medicine

## 2023-09-18 ENCOUNTER — Encounter: Payer: Self-pay | Admitting: Family Medicine

## 2023-09-18 ENCOUNTER — Other Ambulatory Visit: Payer: Self-pay | Admitting: Family Medicine

## 2023-09-18 ENCOUNTER — Other Ambulatory Visit: Payer: Self-pay | Admitting: Orthopedic Surgery

## 2023-09-18 DIAGNOSIS — Z1231 Encounter for screening mammogram for malignant neoplasm of breast: Secondary | ICD-10-CM

## 2023-09-27 ENCOUNTER — Ambulatory Visit: Payer: Medicaid Other | Admitting: Family Medicine

## 2023-09-29 DIAGNOSIS — M179 Osteoarthritis of knee, unspecified: Secondary | ICD-10-CM | POA: Diagnosis not present

## 2023-10-03 ENCOUNTER — Ambulatory Visit: Admitting: Family Medicine

## 2023-10-04 ENCOUNTER — Ambulatory Visit: Payer: Medicaid Other | Admitting: Orthopedic Surgery

## 2023-10-06 ENCOUNTER — Telehealth: Payer: Self-pay | Admitting: Orthopedic Surgery

## 2023-10-06 ENCOUNTER — Other Ambulatory Visit: Payer: Self-pay | Admitting: Family Medicine

## 2023-10-06 NOTE — Telephone Encounter (Signed)
 Pt called requesting pain medication. Please send to pharmacy on file. Pt phone number is [phone].

## 2023-10-10 ENCOUNTER — Ambulatory Visit
Admission: RE | Admit: 2023-10-10 | Discharge: 2023-10-10 | Disposition: A | Discharge status: Home / Self Care | Payer: Medicaid Other | Source: Ambulatory Visit | Attending: Family Medicine | Admitting: Family Medicine

## 2023-10-10 ENCOUNTER — Ambulatory Visit
Admission: RE | Admit: 2023-10-10 | Discharge: 2023-10-10 | Disposition: A | Discharge status: Home / Self Care | Source: Ambulatory Visit | Attending: Family Medicine | Admitting: Family Medicine

## 2023-10-10 DIAGNOSIS — Z1231 Encounter for screening mammogram for malignant neoplasm of breast: Secondary | ICD-10-CM | POA: Diagnosis not present

## 2023-10-11 ENCOUNTER — Telehealth: Payer: Self-pay | Admitting: Orthopedic Surgery

## 2023-10-11 NOTE — Telephone Encounter (Signed)
 Pt called requesting pain medication. Please send to pharmacy on file. Pt phone number is [phone].

## 2023-10-12 ENCOUNTER — Other Ambulatory Visit: Payer: Self-pay | Admitting: Orthopedic Surgery

## 2023-10-12 MED ORDER — HYDROCODONE-ACETAMINOPHEN 5-325 MG PO TABS: 1.0000 | ORAL_TABLET | Freq: Four times a day (QID) | ORAL | 0 refills | Status: DC | PRN

## 2023-10-12 NOTE — Telephone Encounter (Signed)
 Sent thx

## 2023-10-30 ENCOUNTER — Telehealth: Payer: Self-pay | Admitting: Orthopedic Surgery

## 2023-10-30 NOTE — Telephone Encounter (Signed)
 Pt called and made an appt. She is asking for pain medication be sent to pharmacy on file. Informed pt has quit a few no shows and could possibly dismissed as a pt if she dont call to cancel. Pt states phone volume is always high and unable to contact or cancel appt. Informed pt she can go online and cancel appt and call when ready to reschdeule.Pt phone number is [phone].

## 2023-10-30 NOTE — Telephone Encounter (Signed)
 Norco sent on 320.  If she needs more pain medicine I think we could do 1 more prescription and then send to pain management but only after her clinic visit.  Thanks

## 2023-10-31 NOTE — Telephone Encounter (Signed)
 Patient has appt for 04/16

## 2023-10-31 NOTE — Telephone Encounter (Signed)
 Will send with next clinic visit.

## 2023-11-06 ENCOUNTER — Other Ambulatory Visit: Payer: Self-pay | Admitting: Orthopedic Surgery

## 2023-11-06 ENCOUNTER — Other Ambulatory Visit: Payer: Self-pay | Admitting: Family Medicine

## 2023-11-06 DIAGNOSIS — M810 Age-related osteoporosis without current pathological fracture: Secondary | ICD-10-CM

## 2023-11-08 ENCOUNTER — Telehealth: Payer: Self-pay | Admitting: Orthopedic Surgery

## 2023-11-08 ENCOUNTER — Ambulatory Visit: Admitting: Orthopedic Surgery

## 2023-11-08 NOTE — Telephone Encounter (Signed)
 Patient called and said that she needed to cancel her appointment because she didn't have the copay. She called very upset because she has been coming for years and now she doesn't have the copay is unreal. She ask if you could give her a referral to another place. She needed to seen to get pain medication from her surgery. CB#[phone]

## 2023-11-13 ENCOUNTER — Telehealth: Payer: Self-pay | Admitting: Orthopedic Surgery

## 2023-11-13 NOTE — Telephone Encounter (Signed)
 Message was sent to Dr Rozelle Corning. Waiting for him to advise.

## 2023-11-13 NOTE — Telephone Encounter (Signed)
 Another message was placed last week about this. [employer] for [employer]

## 2023-11-13 NOTE — Telephone Encounter (Signed)
 Patient called and ask about her sling that she need for her arm. And also needs some pain medication. She said that she is the one that came in and got turned away for not being able to pay her $4 dollar copay. CB#[phone]

## 2023-11-21 ENCOUNTER — Other Ambulatory Visit: Payer: Self-pay | Admitting: Orthopedic Surgery

## 2023-11-21 ENCOUNTER — Telehealth: Payer: Self-pay | Admitting: Orthopedic Surgery

## 2023-11-21 MED ORDER — ACETAMINOPHEN-CODEINE 300-30 MG PO TABS: 1.0000 | ORAL_TABLET | Freq: Three times a day (TID) | ORAL | 0 refills | Status: DC | PRN

## 2023-11-21 NOTE — Telephone Encounter (Signed)
T 3 sent

## 2023-11-21 NOTE — Telephone Encounter (Signed)
 Patient called and said that why she has to wait just to get a hand brace and medication refill. CB#[phone]

## 2023-11-22 NOTE — Telephone Encounter (Signed)
 I called and advised pt and told her we need to see her for a follow-up since she is still having problems. Appt made for 5/14

## 2023-11-22 NOTE — Telephone Encounter (Signed)
 See other message

## 2023-12-04 ENCOUNTER — Other Ambulatory Visit: Payer: Self-pay | Admitting: Family Medicine

## 2023-12-06 ENCOUNTER — Other Ambulatory Visit: Payer: Self-pay

## 2023-12-06 ENCOUNTER — Ambulatory Visit (INDEPENDENT_AMBULATORY_CARE_PROVIDER_SITE_OTHER): Admitting: Orthopedic Surgery

## 2023-12-06 DIAGNOSIS — M25511 Pain in right shoulder: Secondary | ICD-10-CM | POA: Diagnosis not present

## 2023-12-06 DIAGNOSIS — M65911 Unspecified synovitis and tenosynovitis, right shoulder: Secondary | ICD-10-CM | POA: Diagnosis not present

## 2023-12-06 MED ORDER — HYDROCODONE-ACETAMINOPHEN 5-325 MG PO TABS: 1.0000 | ORAL_TABLET | Freq: Two times a day (BID) | ORAL | 0 refills | Status: AC | PRN

## 2023-12-06 NOTE — Progress Notes (Signed)
 Office Visit Note   Patient: Annette Chang           Date of Birth: 01-28-1971           MRN: [id number] Visit Date: 12/06/2023 Requested by: Edison Gore, MD [address] [place],  [place] [address] PCP: Edison Gore, MD  Subjective: Chief Complaint  Patient presents with   Right Shoulder - Pain   Right Wrist - Follow-up    Follow up 06/20/23 right 1st dorsal compartment release    HPI: Annette Chang is a 53 y.o. female who presents to the office reporting for follow-up of right wrist first dorsal compartment release done 6 months ago.  On that front she is doing well with good motion.  She also describes right shoulder pain.  She felt a pop a few weeks ago while moving.  Denies any neck pain.  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                ROS: All systems reviewed are negative as they relate to the chief complaint within the history of present illness.  Patient denies fevers or chills.  Assessment & Plan: Visit Diagnoses:  1. Right shoulder pain, unspecified chronicity     Plan: Impression is doing well with first dorsal compartment release on the right-hand side.  Good range of motion.  The right shoulder is a very complex problem.  Radiographs unremarkable.  Ultrasound examination of the rotator cuff shows nothing definitively torn.  I think in general the biomechanics of her right shoulder are such that she will have definite stress on the rotator cuff.  However she does have pretty reasonable external rotation and internal rotation strength today.  We will try an ultrasound-guided injection into the glenohumeral joint and refill medication x 1.  Follow-up as needed.  Follow-Up Instructions: No follow-ups on file.   Orders:  Orders Placed This Encounter  Procedures   XR Shoulder Right   No orders of the defined types were placed in this encounter.     Procedures: Large Joint Inj: R glenohumeral on  12/06/2023 3:55 PM Indications: diagnostic evaluation and pain Details: 22 G 3.5 in needle, ultrasound-guided posterior approach  Arthrogram: No  Medications: 9 mL bupivacaine  0.5 %; 5 mL lidocaine  1 % Outcome: tolerated well, no immediate complications Procedure, treatment alternatives, risks and benefits explained, specific risks discussed. Consent was given by the patient. Immediately prior to procedure a time out was called to verify the correct patient, procedure, equipment, support staff and site/side marked as required. Patient was prepped and draped in the usual sterile fashion.     Kenalog injected  Clinical Data: No additional findings.  Objective: Vital Signs: There were no vitals taken for this visit.  Physical Exam:  Constitutional: Patient appears well-developed HEENT:  Head: Normocephalic Eyes:EOM are normal Neck: Normal range of motion Cardiovascular: Normal rate Pulmonary/chest: Effort normal Neurologic: Patient is alert Skin: Skin is warm Psychiatric: Patient has normal mood and affect  Ortho Exam: Ortho exam demonstrates good range of motion of the right wrist with well-healed surgical incision of the first dorsal compartment.  No subluxation of the wrist first compartment tendons.  On the right shoulder she has chronic scapular winging.  Not too much coarse grinding or crepitus with active or passive range of motion of the shoulder.  Motor or sensory function to the hand is intact.  Cervical spine range of motion is  full.  No Popeye deformity.  Internal and external rotation strength is symmetric with the left arm.  Specialty Comments:  06/04/2021 Impression: Essentially NORMAL electrodiagnostic study of the left upper limb.  There is no significant electrodiagnostic evidence of nerve entrapment, brachial plexopathy or cervical radiculopathy.  Specifically no evidence of axonal or demyelinating long thoracic nerve neuropathy although this particular testing is  fraught with technical difficulties.  No differences when compared to the left upper extremity study of 2020 although again that study did not specifically look at the long thoracic nerve.     As you know, purely sensory or demyelinating radiculopathies and chemical radiculitis may not be detected with this particular electrodiagnostic study.   Recommendations: 1.  Follow-up with referring physician. 2.  Continue current management of symptoms.   ___________________________ Collin Deal FAAPMR ------ 11/29/2018 Impression: Essentially NORMAL electrodiagnostic study of the left upper limb.  There is no significant electrodiagnostic evidence of nerve entrapment, brachial plexopathy or cervical radiculopathy.     As you know, purely sensory or demyelinating radiculopathies and chemical radiculitis may not be detected with this particular electrodiagnostic study.   Also this would not detect a purely demyelinated specific brachial plexus issue.   Recommendations: 1.  Follow-up with referring physician. 2.  Continue current management of symptoms.   ___________________________ Collin Deal FAAPMR ----- Nerve conduction studies IMPRESSION:   Nerve conduction studies done on the right upper extremity were unremarkable, without evidence of a peripheral neuropathy. 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 No evidence of a cervical radiculopathy was seen.   Carver Clark MD 11/06/2014 11:09 AM  Imaging: No results found.   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   Irregular finger nails  11/27/2020   Current smoker 06/26/2020   Osteoporosis 12/08/2017   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   MDD (major depressive disorder) 03/29/2016   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   Chronic urticaria 08/06/2015   H/O rotator cuff surgery 01/02/2012   Arthritis of shoulder region, 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   Iron  deficiency anemia 01/02/2012   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   Blood transfusion without reported diagnosis    Depression    Frequent falls 04/11/2023   medication related, was told to decrease sedating medications   H/O rotator cuff surgery 01/02/2012   H/O tubal ligation 01/02/2012   H/O: C-section 01/02/2012   3    H/O: C-section 01/02/2012   3    Headache(784.0) 01/02/2012   Iron  deficiency anemia 01/02/2012   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   Psoriasis 03/17/2022   Suicidal ideation 07/27/2022    Family History  Problem Relation Age of Onset   Diabetes Maternal Grandmother    Diabetes Paternal Grandmother    Diabetes Paternal Grandfather    Colon cancer Neg Hx    Esophageal cancer Neg Hx    Rectal cancer Neg Hx    Stomach cancer Neg Hx    BRCA 1/2 Neg Hx    Breast cancer Neg Hx     Past Surgical History:  Procedure Laterality Date  CESAREAN SECTION     3 previous c sections   COLONOSCOPY  05/15/2020   DORSAL COMPARTMENT RELEASE Right 06/20/2023   Procedure: RIGHT FIRST DORSAL COMPARTMENT (DEQUERVAINS);  Surgeon: Jasmine Mesi, MD;  Location: [facility] OR;  Service: Orthopedics;  Laterality: Right;   EYE SURGERY Bilateral    MRI     x several   MUSCLE REPAIR Right 01/10/2017   RIGHT PECTORAL MAJOR TO SCAPULA MUSCLE TRANSFER (Right   NOVASURE ABLATION N/A 03/11/2014   Procedure: NOVASURE ABLATION;  Surgeon: Ana Balling, MD;  Location: [facility] ORS;  Service: Gynecology;  Laterality: N/A;   PECTORALIS TENDON REPAIR Right 01/10/2017   Procedure: RIGHT PECTORAL MAJOR TO SCAPULA MUSCLE  TRANSFER;  Surgeon: Jasmine Mesi, MD;  Location: [facility] OR;  Service: Orthopedics;  Laterality: Right;   PECTORALIS TENDON REPAIR Right 12/28/2017   Procedure: RIGHT REVISION TENDON TRANSFER OF PECTORALIS MAJOR;  Surgeon: Jasmine Mesi, MD;  Location: [facility] OR;  Service: Orthopedics;  Laterality: Right;   rotator cuff surgery     SHOULDER SURGERY     TUBAL LIGATION     Social History   Occupational History   Occupation: unemployed  Tobacco Use   Smoking status: Some Days    Current packs/day: 0.00    Types: Cigarettes    Start date: 11/2012    Last attempt to quit: 11/2017    Years since quitting: 6.0    Passive exposure: Past   Smokeless tobacco: Never  Vaping Use   Vaping status: Never Used  Substance and Sexual Activity   Alcohol use: Yes    Alcohol/week: 18.0 standard drinks of alcohol    Types: 18 Cans of beer per week    Comment: pt reports she currently drinks on weekends only- was drinking more in past   Drug use: Yes    Types: Cocaine, Marijuana    Comment: denies cocaine use in last 6 months as of (06/20/23)   Sexual activity: Not Currently    Birth control/protection: Surgical    Comment: tubal ligation

## 2023-12-09 ENCOUNTER — Encounter: Payer: Self-pay | Admitting: Orthopedic Surgery

## 2023-12-10 MED ORDER — BUPIVACAINE HCL 0.5 % IJ SOLN
9.0000 mL | INTRAMUSCULAR | Status: AC | PRN
  Administered 2023-12-06: 9 mL via INTRA_ARTICULAR

## 2023-12-10 MED ORDER — LIDOCAINE HCL 1 % IJ SOLN
5.0000 mL | INTRAMUSCULAR | Status: AC | PRN
  Administered 2023-12-06: 5 mL

## 2023-12-11 ENCOUNTER — Telehealth: Payer: Self-pay | Admitting: Orthopedic Surgery

## 2023-12-11 NOTE — Telephone Encounter (Signed)
 Pt called asking for a work note for her last visit which was 12/06/23. Pt states she is coming in with her uncle that has an appt today at 3:30 pm and asking for the letter when she comes with him. Pt phone number is [phone].

## 2023-12-11 NOTE — Telephone Encounter (Signed)
 Called patient. Note provided that she was here on the 14th. She wants to know if a velcro wrist brace would help?

## 2023-12-12 NOTE — Telephone Encounter (Signed)
 Worth a try - should not hurt

## 2023-12-12 NOTE — Telephone Encounter (Signed)
 Tried to call to schedule a nurse visit for fitting of right wrist velcro brace. No answer. Voicemail is full.

## 2023-12-26 ENCOUNTER — Ambulatory Visit: Admitting: Orthopaedic Surgery

## 2023-12-29 ENCOUNTER — Telehealth: Payer: Self-pay | Admitting: Orthopedic Surgery

## 2023-12-29 NOTE — Telephone Encounter (Signed)
 Pt called requesting pain medication. Pt rescheduled appt with Christiane Cowing due to tooth ache. Please send to pharmacy on file. Pt phone number is [phone]. Please call pt about this matter.

## 2024-01-04 ENCOUNTER — Other Ambulatory Visit: Payer: Self-pay | Admitting: Family Medicine

## 2024-01-08 ENCOUNTER — Other Ambulatory Visit: Payer: Self-pay | Admitting: Orthopedic Surgery

## 2024-01-08 MED ORDER — ACETAMINOPHEN-CODEINE 300-30 MG PO TABS: 1.0000 | ORAL_TABLET | Freq: Three times a day (TID) | ORAL | 0 refills | Status: AC | PRN

## 2024-01-08 NOTE — Telephone Encounter (Signed)
 Tylenol  3 sent thanks

## 2024-01-09 ENCOUNTER — Ambulatory Visit: Admitting: Orthopaedic Surgery

## 2024-01-16 ENCOUNTER — Other Ambulatory Visit: Payer: Self-pay | Admitting: Family Medicine

## 2024-01-18 ENCOUNTER — Ambulatory Visit: Attending: Cardiology | Admitting: Cardiology

## 2024-01-18 DIAGNOSIS — R0609 Other forms of dyspnea: Secondary | ICD-10-CM | POA: Insufficient documentation

## 2024-01-18 NOTE — Progress Notes (Deleted)
  Cardiology Office Note:  .   Date:  01/18/2024  ID:  Julian LITTIE Childes, DOB [birth year], MRN [id number] PCP: Romelle Booty, MD  [facility] Providers Cardiologist:  Newman Lawrence, MD PCP: Romelle Booty, MD  No chief complaint on file.    Annette Chang is a 53 y.o. female with *** Discussed the use of AI scribe software for clinical note transcription with the patient, who gave verbal consent to proceed.  History of Present Illness       There were no vitals filed for this visit.    ROS      Studies Reviewed: .        *** Labs 07/2023: Chol ***, TG ***, HDL ***, LDL *** HbA1C ***% Hb 13.2 Cr 0.5 TSH 0.6  Risk Assessment/Calculations:   {Does this patient have ATRIAL FIBRILLATION?:[id number]}    Physical Exam   VISIT DIAGNOSES: No diagnosis found.   Tywanda L Eastland is a 53 y.o. female with *** Assessment and Plan Assessment & Plan       {Are you ordering a CV Procedure (e.g. stress test, cath, DCCV, TEE, etc)?   Press F2        :[id number]}    No orders of the defined types were placed in this encounter.    F/u in ***  Signed, Newman JINNY Lawrence, MD

## 2024-01-19 ENCOUNTER — Ambulatory Visit: Admitting: Cardiology

## 2024-01-19 ENCOUNTER — Encounter: Payer: Self-pay | Admitting: Cardiology

## 2024-02-28 ENCOUNTER — Telehealth: Payer: Self-pay | Admitting: Orthopaedic Surgery

## 2024-02-28 NOTE — Telephone Encounter (Signed)
[name] patient 

## 2024-02-28 NOTE — Telephone Encounter (Signed)
 Patient called and ask if she could get some pain medication for her shoulder. CB#[phone]

## 2024-03-01 NOTE — Telephone Encounter (Signed)
 IC LM for patient to return call to discuss.

## 2024-03-01 NOTE — Telephone Encounter (Signed)
 Hold off for now - how about pain mngmt for patient

## 2024-03-06 ENCOUNTER — Other Ambulatory Visit: Payer: Self-pay | Admitting: Orthopedic Surgery

## 2024-03-07 ENCOUNTER — Encounter (HOSPITAL_COMMUNITY): Payer: Self-pay | Admitting: Emergency Medicine

## 2024-03-07 ENCOUNTER — Other Ambulatory Visit: Payer: Self-pay

## 2024-03-07 ENCOUNTER — Emergency Department (HOSPITAL_COMMUNITY)

## 2024-03-07 ENCOUNTER — Emergency Department (HOSPITAL_COMMUNITY)
Admission: EM | Admit: 2024-03-07 | Discharge: 2024-03-07 | Discharge status: Against Medical Advice | Attending: Emergency Medicine | Admitting: Emergency Medicine

## 2024-03-07 DIAGNOSIS — R0781 Pleurodynia: Secondary | ICD-10-CM | POA: Diagnosis present

## 2024-03-07 DIAGNOSIS — R002 Palpitations: Secondary | ICD-10-CM | POA: Diagnosis not present

## 2024-03-07 DIAGNOSIS — Z5321 Procedure and treatment not carried out due to patient leaving prior to being seen by health care provider: Secondary | ICD-10-CM | POA: Diagnosis not present

## 2024-03-07 DIAGNOSIS — W19XXXA Unspecified fall, initial encounter: Secondary | ICD-10-CM | POA: Insufficient documentation

## 2024-03-07 NOTE — ED Notes (Signed)
 Pt stated they had been here since 0800 and they wanted to leave so they left.

## 2024-03-07 NOTE — ED Triage Notes (Signed)
 PT came in POV for a mechanical fall on Sunday morning with while using restroom.  Pt complains of left side rib pain on inspiration and palpation. No other complaints.

## 2024-03-07 NOTE — ED Notes (Signed)
 Pt reassessed.  NAD noted.

## 2024-03-15 ENCOUNTER — Ambulatory Visit: Admitting: Orthopaedic Surgery

## 2024-03-20 ENCOUNTER — Ambulatory Visit (INDEPENDENT_AMBULATORY_CARE_PROVIDER_SITE_OTHER): Admitting: Podiatry

## 2024-03-20 DIAGNOSIS — Z91199 Patient's noncompliance with other medical treatment and regimen due to unspecified reason: Secondary | ICD-10-CM

## 2024-03-20 NOTE — Progress Notes (Signed)
 No show

## 2024-04-04 ENCOUNTER — Other Ambulatory Visit: Payer: Self-pay | Admitting: Family Medicine

## 2024-04-04 DIAGNOSIS — M810 Age-related osteoporosis without current pathological fracture: Secondary | ICD-10-CM

## 2024-04-10 NOTE — Progress Notes (Incomplete)
  Cardiology Office Note:   Date:  04/10/2024  ID:  Annette Chang, DOB 02-Dec-1970, MRN [id number] PCP: Romelle Booty, MD  [facility] Providers Cardiologist:  None { Chief Complaint: No chief complaint on file.     History of Present Illness:   Annette Chang is a 53 y.o. female with a PMH of *** who presents as a new patient referral by Dr. Romelle for evaluation of DOE.  She presented to her PCP on 08/21/2023 with reports of DOE.  CBC, BMP, and TSH were normal at that time.  Referred to cardiology for further evaluation.  She had a recent ED visit in August after suffering a mechanical fall.  CXR revealed rib fractures.  She left the ED before full evaluation.   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  {Refresh Note OR Click here to enter BP  :1}***        Physical Exam:     VS:  There were no vitals taken for this visit. ***    Wt Readings from Last 3 Encounters:  08/21/23 95 lb 4 oz (43.2 kg)  06/20/23 100 lb (45.4 kg)  04/11/23 92 lb 6.4 oz (41.9 kg)     GEN: Well nourished, well developed, in no acute distress NECK: No JVD; No carotid bruits CARDIAC: ***RRR, no murmurs, rubs, gallops RESPIRATORY:  Clear to auscultation without rales, wheezing or rhonchi  ABDOMEN: Soft,  non-tender, non-distended, normal bowel sounds EXTREMITIES:  Warm and well perfused, no edema; No deformity, 2+ radial pulses PSYCH: Normal mood and affect   Assessment & Plan       {Are you ordering a CV Procedure (e.g. stress test, cath, DCCV, TEE, etc)?   Press F2        :[id number]}   This note was written with the assistance of a dictation microphone or AI dictation software. Please excuse any typos or grammatical errors.   Signed, Georganna Archer, MD 04/10/2024 11:14 AM    [facility]

## 2024-04-11 ENCOUNTER — Ambulatory Visit
Attending: Student in an Organized Health Care Education/Training Program | Admitting: Student in an Organized Health Care Education/Training Program

## 2024-04-11 ENCOUNTER — Ambulatory Visit: Admitting: Family Medicine

## 2024-04-16 ENCOUNTER — Ambulatory Visit (INDEPENDENT_AMBULATORY_CARE_PROVIDER_SITE_OTHER): Admitting: Orthopaedic Surgery

## 2024-04-16 ENCOUNTER — Telehealth: Payer: Self-pay | Admitting: Orthopedic Surgery

## 2024-04-16 DIAGNOSIS — G894 Chronic pain syndrome: Secondary | ICD-10-CM | POA: Diagnosis not present

## 2024-04-16 DIAGNOSIS — M25511 Pain in right shoulder: Secondary | ICD-10-CM

## 2024-04-16 NOTE — Telephone Encounter (Signed)
 Patient called and said she needs pain medication for both shoulders. CB#[phone]

## 2024-04-16 NOTE — Addendum Note (Signed)
 Addended by: Ysmael Hires on: 04/16/2024 02:30 PM   Modules accepted: Orders

## 2024-04-16 NOTE — Progress Notes (Signed)
 Office Visit Note   Patient: Annette Chang           Date of Birth: March 23, 1971           MRN: [id number] Visit Date: 04/16/2024              Requested by: Romelle Booty, MD [address] [place],  [place] [address] PCP: Romelle Booty, MD   Assessment & Plan: Visit Diagnoses:  1. Right shoulder pain, unspecified chronicity     Plan: History of Present Illness Annette Chang is a 53 year old female with chronic shoulder pain who presents with persistent bilateral shoulder pain. She was referred by Dr. Addie for evaluation of her shoulder pain.  She experiences ongoing pain in her right shoulder, which has now started affecting her left shoulder, interfering with daily activities. She has undergone four surgeries and various diagnostic tests since 2010, yet significant pain persists. A blue sling was ineffective and caused discomfort. A shoulder injection in May did not provide relief, and she is not currently on pain medication. Physical therapy was unhelpful for her condition.  Right shoulder exam unchanged.  Assessment and Plan Chronic bilateral shoulder pain syndrome Chronic pain in both shoulders, more severe on the right, likely metabolic or physiologic. Previous surgeries and treatments ineffective. Focus on pain management. - Refer to pain management clinic for evaluation and management. - Defer to Doctor Addie for potential pain medication prescription.  Follow-Up Instructions: No follow-ups on file.   Orders:  No orders of the defined types were placed in this encounter.  No orders of the defined types were placed in this encounter.     Procedures: No procedures performed   Clinical Data: No additional findings.   Subjective: Chief Complaint  Patient presents with   Right Shoulder - Pain    HPI  Review of Systems  Constitutional: Negative.   HENT: Negative.    Eyes: Negative.   Respiratory: Negative.    Cardiovascular: Negative.   Endocrine:  Negative.   Musculoskeletal: Negative.   Neurological: Negative.   Hematological: Negative.   Psychiatric/Behavioral: Negative.    All other systems reviewed and are negative.    Objective: Vital Signs: There were no vitals taken for this visit.  Physical Exam Vitals and nursing note reviewed.  Constitutional:      Appearance: She is well-developed.  HENT:     Head: Atraumatic.     Nose: Nose normal.  Eyes:     Extraocular Movements: Extraocular movements intact.  Cardiovascular:     Pulses: Normal pulses.  Pulmonary:     Effort: Pulmonary effort is normal.  Abdominal:     Palpations: Abdomen is soft.  Musculoskeletal:     Cervical back: Neck supple.  Skin:    General: Skin is warm.     Capillary Refill: Capillary refill takes less than 2 seconds.  Neurological:     Mental Status: She is alert. Mental status is at baseline.  Psychiatric:        Behavior: Behavior normal.        Thought Content: Thought content normal.        Judgment: Judgment normal.     Ortho Exam  Specialty Comments:  06/04/2021 Impression: Essentially NORMAL electrodiagnostic study of the left upper limb.  There is no significant electrodiagnostic evidence of nerve entrapment, brachial plexopathy or cervical radiculopathy.  Specifically no evidence of axonal or demyelinating long thoracic nerve neuropathy although this particular testing is fraught with technical  difficulties.  No differences when compared to the left upper extremity study of 2020 although again that study did not specifically look at the long thoracic nerve.     As you know, purely sensory or demyelinating radiculopathies and chemical radiculitis may not be detected with this particular electrodiagnostic study.   Recommendations: 1.  Follow-up with referring physician. 2.  Continue current management of symptoms.   ___________________________ Prentice Masters FAAPMR ------ 11/29/2018 Impression: Essentially NORMAL  electrodiagnostic study of the left upper limb.  There is no significant electrodiagnostic evidence of nerve entrapment, brachial plexopathy or cervical radiculopathy.     As you know, purely sensory or demyelinating radiculopathies and chemical radiculitis may not be detected with this particular electrodiagnostic study.   Also this would not detect a purely demyelinated specific brachial plexus issue.   Recommendations: 1.  Follow-up with referring physician. 2.  Continue current management of symptoms.   ___________________________ Prentice Masters FAAPMR ----- Nerve conduction studies IMPRESSION:   Nerve conduction studies done on the right upper extremity were unremarkable, without evidence of a peripheral neuropathy. 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 No evidence of a cervical radiculopathy was seen.   KYM Francis Bull MD 11/06/2014 11:09 AM  Imaging: No results found.   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   Chronic urticaria 08/06/2015   H/O rotator cuff surgery 01/02/2012   Arthritis of shoulder region, 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   Iron  deficiency anemia 01/02/2012   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   Blood transfusion without reported diagnosis    Depression    Frequent falls  04/11/2023   medication related, was told to decrease sedating medications   H/O rotator cuff surgery 01/02/2012   H/O tubal ligation 01/02/2012   H/O: C-section 01/02/2012   3    H/O: C-section 01/02/2012   3    Headache(784.0) 01/02/2012   Iron  deficiency anemia 01/02/2012   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   Psoriasis 03/17/2022   Suicidal ideation 07/27/2022    Family History  Problem Relation Age of Onset   Diabetes Maternal Grandmother    Diabetes Paternal Grandmother    Diabetes Paternal Grandfather    Colon cancer Neg Hx    Esophageal cancer Neg Hx    Rectal cancer Neg Hx    Stomach cancer Neg Hx    BRCA 1/2 Neg Hx    Breast cancer Neg Hx     Past Surgical History:  Procedure Laterality Date   CESAREAN SECTION     3 previous c sections   COLONOSCOPY  05/15/2020   DORSAL COMPARTMENT RELEASE Right 06/20/2023   Procedure: RIGHT FIRST DORSAL COMPARTMENT (DEQUERVAINS);  Surgeon: Addie Cordella Hamilton, MD;  Location: [facility] OR;  Service: Orthopedics;  Laterality: Right;   EYE SURGERY Bilateral    MRI     x  several   MUSCLE REPAIR Right 01/10/2017   RIGHT PECTORAL MAJOR TO SCAPULA MUSCLE TRANSFER (Right   NOVASURE ABLATION N/A 03/11/2014   Procedure: NOVASURE ABLATION;  Surgeon: Harland JAYSON Birkenhead, MD;  Location: [facility] ORS;  Service: Gynecology;  Laterality: N/A;   PECTORALIS TENDON REPAIR Right 01/10/2017   Procedure: RIGHT PECTORAL MAJOR TO SCAPULA MUSCLE TRANSFER;  Surgeon: Addie Glendia Hacker, MD;  Location: [facility] OR;  Service: Orthopedics;  Laterality: Right;   PECTORALIS TENDON REPAIR Right 12/28/2017   Procedure: RIGHT REVISION TENDON TRANSFER OF PECTORALIS MAJOR;  Surgeon: Addie Hacker Glendia, MD;  Location: [facility] OR;  Service: Orthopedics;  Laterality: Right;   rotator cuff surgery     SHOULDER SURGERY     TUBAL LIGATION     Social History   Occupational History   Occupation: unemployed  Tobacco Use   Smoking status: Some Days    Current packs/day: 0.00     Types: Cigarettes    Start date: 11/2012    Last attempt to quit: 11/2017    Years since quitting: 6.4    Passive exposure: Past   Smokeless tobacco: Never  Vaping Use   Vaping status: Never Used  Substance and Sexual Activity   Alcohol use: Yes    Alcohol/week: 18.0 standard drinks of alcohol    Types: 18 Cans of beer per week    Comment: pt reports she currently drinks on weekends only- was drinking more in past   Drug use: Yes    Types: Cocaine, Marijuana    Comment: denies cocaine use in last 6 months as of (06/20/23)   Sexual activity: Not Currently    Birth control/protection: Surgical    Comment: tubal ligation

## 2024-04-17 ENCOUNTER — Telehealth: Payer: Self-pay

## 2024-04-17 ENCOUNTER — Telehealth: Payer: Self-pay | Admitting: Orthopedic Surgery

## 2024-04-17 ENCOUNTER — Other Ambulatory Visit: Payer: Self-pay | Admitting: Orthopedic Surgery

## 2024-04-17 MED ORDER — ACETAMINOPHEN-CODEINE 300-30 MG PO TABS: 1.0000 | ORAL_TABLET | Freq: Three times a day (TID) | ORAL | 0 refills | Status: AC | PRN

## 2024-04-17 NOTE — Telephone Encounter (Signed)
 Spoke with patient. Very thankful for sending in medication and feels better that you agree with pain management.

## 2024-04-17 NOTE — Telephone Encounter (Signed)
 Note reviewed - t 3 prescribed - agree with pain mngmt - last rx 6/24

## 2024-04-17 NOTE — Telephone Encounter (Signed)
 Patient is very upset about not hearing anything about her pain medication and wants to know what is going on. She was here yesterday and saw Dr. Jerri and he didn't know why she was here to see him and she would like a return call to [phone]. Please give her asap.

## 2024-04-17 NOTE — Telephone Encounter (Signed)
 Duplicate message

## 2024-04-17 NOTE — Telephone Encounter (Signed)
 Patient called and is very upset about Addie told her to see XU and XU didn't know what she was there for. She is saying that she needed medication for her shoulder and she said Addie is the one who has been doing her surgeries. CB#[phone]

## 2024-05-07 ENCOUNTER — Other Ambulatory Visit: Payer: Self-pay | Admitting: Orthopedic Surgery

## 2024-05-29 ENCOUNTER — Telehealth: Payer: Self-pay

## 2024-05-29 ENCOUNTER — Ambulatory Visit: Admitting: Family Medicine

## 2024-05-29 NOTE — Telephone Encounter (Signed)
 Patient calls nurse line requesting (1) tablet of her anxiety medication.   She reports she had an apt today with PCP, however she had to cancel due to transportation.   She reports an upsetting day yesterday that still has her unnerved. She reports a negative experience with [facility]. She reports confusion over a scheduled time.  Patient reports she has rescheduled her apt with PCP for 11/10.  Patient reports she does not know the name of the anxiety medication she is requesting. She reports she will call her pharmacy and call back.  Will await her return call.

## 2024-06-03 ENCOUNTER — Ambulatory Visit (INDEPENDENT_AMBULATORY_CARE_PROVIDER_SITE_OTHER): Admitting: Family Medicine

## 2024-06-03 ENCOUNTER — Encounter: Payer: Self-pay | Admitting: Family Medicine

## 2024-06-03 VITALS — BP 107/85 | HR 70 | Ht 61.0 in | Wt 90.6 lb

## 2024-06-03 DIAGNOSIS — M810 Age-related osteoporosis without current pathological fracture: Secondary | ICD-10-CM | POA: Diagnosis not present

## 2024-06-03 DIAGNOSIS — Z23 Encounter for immunization: Secondary | ICD-10-CM | POA: Diagnosis not present

## 2024-06-03 DIAGNOSIS — F419 Anxiety disorder, unspecified: Secondary | ICD-10-CM

## 2024-06-03 DIAGNOSIS — F32A Depression, unspecified: Secondary | ICD-10-CM

## 2024-06-03 DIAGNOSIS — L409 Psoriasis, unspecified: Secondary | ICD-10-CM | POA: Diagnosis not present

## 2024-06-03 MED ORDER — DULOXETINE HCL 60 MG PO CPEP: 60.0000 mg | ORAL_CAPSULE | Freq: Every day | ORAL | 1 refills | Status: AC

## 2024-06-03 NOTE — Patient Instructions (Addendum)
 Stop taking wellbutrin  (buproprion)  Start taking duloxetine (Cymbalta) as prescribed.  Continue therapy.  Lets follow-up in about 4 to 6 weeks to check-in  I placed a new referral for dermatology

## 2024-06-03 NOTE — Progress Notes (Signed)
    SUBJECTIVE:   CHIEF COMPLAINT / HPI:   Discussed the use of AI scribe software for clinical note transcription with the patient, who gave verbal consent to proceed.  History of Present Illness Annette Chang is a 53 year old female who presents with inadequate response to current medication for depression and anxiety.  Mood and anxiety symptoms - Inadequate response to current medication regimen for depression and anxiety. - Wellbutrin  is ineffective, particularly for anxiety, which is more problematic than depression. - Zoloft  was previously more effective for relaxation but sometimes caused excessive sedation. - Interest in trying a different medication for mood and anxiety symptoms. - Attends therapy twice weekly and participates in a group at the [employer] [facility] church, which she finds helpful. - Occasionally having menopausal symptoms like hot flashes though these have recently calmed down - using trazodone  as needed for sleep, this doesn't always help  Osteoporosis management - Continues Fosamax  therapy, taking one pill every Tuesday.     PERTINENT  PMH / PSH: Osteoporosis, MDD, anxiety  OBJECTIVE:   BP 107/85   Pulse 70   Ht 5' 1 (1.549 m)   Wt 90 lb 9.6 oz (41.1 kg)   SpO2 100%   BMI 17.12 kg/m    General: NAD, pleasant, able to participate in exam Respiratory: No respiratory distress Psych: Normal affect and mood     06/03/2024    2:34 PM 08/21/2023    1:21 PM 04/11/2023   10:07 AM  PHQ9 SCORE ONLY  PHQ-9 Total Score 4 4  2       Data saved with a previous flowsheet row definition       ASSESSMENT/PLAN:    Assessment & Plan Anxiety and depression Pt interested in trying something new as buproprion is not effective and has worsened some anxiety symptoms. Previously tried zoloft  as well but not interested in that. Also having some concurrent vasomotor symptoms with menopause. TSH normal earlier this year. No SI/HI, her PHQ9 is 4 and GAD7 3,  so overall still well controlled. - Discontinued bupropion . - Initiated duloxetine (Cymbalta) 60 mg daily. This may also help with menopausal symptoms - Continue therapy sessions twice a week. - Follow up in 4-6 weeks to assess response. Psoriasis Pt requesting new referral be placed to dermatologist - she used to see someone in [place] but prefers to see someone [place] to [employer]. Used to be on a biologic for psoriasis Osteoporosis without current pathological fracture, unspecified osteoporosis type Managed with Fosamax , monitoring required due to long-term use. - Continue Fosamax  as prescribed. - Revisit in about 1 yr, has been on this since 2021 so nearing 5 years of therapy. BMP earlier this year with normal creatinine    Payton Coward, MD [facility]

## 2024-06-03 NOTE — Assessment & Plan Note (Signed)
 Managed with Fosamax , monitoring required due to long-term use. - Continue Fosamax  as prescribed. - Revisit in about 1 yr, has been on this since 2021 so nearing 5 years of therapy. BMP earlier this year with normal creatinine

## 2024-06-06 ENCOUNTER — Telehealth: Payer: Self-pay

## 2024-06-06 NOTE — Telephone Encounter (Signed)
 Received message from pharmacist regarding patient's new prescription for Cymbalta.   Pharmacist wanted to verify if patient was also still supposed to be taking Wellbutrin .   Called and LVM on pharmacy line that Dr. Romelle had discontinued Wellbutrin  upon ordering Cymbalta.   Advised to call back with any questions.   Chiquita JAYSON English, RN

## 2024-06-27 ENCOUNTER — Telehealth: Payer: Self-pay

## 2024-06-27 NOTE — Telephone Encounter (Signed)
 Patient calls nurse line regarding concerns with side effects to Cymbalta .   Patient reports that every time after taking medication, she will experience a headache.   She denies any blurry vision or visual changes.   Scheduled patient follow up visit with PCP next Wednesday to further discuss medication management.   Advised patient that I would forward message to Dr. Romelle to see if he had any additional recommendations in the meantime.   Annette JAYSON English, RN

## 2024-07-02 NOTE — Progress Notes (Deleted)
    SUBJECTIVE:   CHIEF COMPLAINT / HPI:   F/u for depression and anxiety Seen by me 11/10, at that time discontinued buproprion (due to poor response) and initiated Cymbalta  60mg  daily . Advised to continue therapy twice weekly  Discussed the use of AI scribe software for clinical note transcription with the patient, who gave verbal consent to proceed.  History of Present Illness      PERTINENT  PMH / PSH: ***  OBJECTIVE:   There were no vitals taken for this visit.   Physical Exam   ***  ASSESSMENT/PLAN:   Assessment and Plan Assessment & Plan     ***  Assessment & Plan    Annette Coward, MD [facility]

## 2024-07-03 ENCOUNTER — Ambulatory Visit: Admitting: Family Medicine

## 2024-07-03 DIAGNOSIS — F419 Anxiety disorder, unspecified: Secondary | ICD-10-CM

## 2024-07-10 ENCOUNTER — Encounter: Payer: Self-pay | Admitting: Family Medicine

## 2024-07-11 ENCOUNTER — Telehealth: Payer: Self-pay

## 2024-07-11 NOTE — Telephone Encounter (Signed)
 Patient LVM on nurse line requesting returned call from PCP regarding headaches. Returned call to patient, however, she did not answer. VM box full.   If patient returns call, please assist in scheduling appt for further evaluation.   Chiquita JAYSON English, RN

## 2024-08-05 ENCOUNTER — Encounter: Payer: Self-pay | Admitting: Family Medicine

## 2024-08-05 ENCOUNTER — Ambulatory Visit: Payer: Self-pay | Admitting: Family Medicine

## 2024-08-05 VITALS — BP 121/87 | HR 66 | Ht 61.0 in | Wt 94.2 lb

## 2024-08-05 DIAGNOSIS — R638 Other symptoms and signs concerning food and fluid intake: Secondary | ICD-10-CM | POA: Diagnosis present

## 2024-08-05 DIAGNOSIS — Z13228 Encounter for screening for other metabolic disorders: Secondary | ICD-10-CM | POA: Diagnosis not present

## 2024-08-05 LAB — POCT GLYCOSYLATED HEMOGLOBIN (HGB A1C): Hemoglobin A1C: 5.4 % (ref 4.0–5.6)

## 2024-08-05 NOTE — Progress Notes (Signed)
" ° ° °  SUBJECTIVE:   CHIEF COMPLAINT / HPI: sugar check?  Discussed the use of AI scribe software for clinical note transcription with the patient, who gave verbal consent to proceed.  History of Present Illness Annette Chang is a 54 year old female who presents with concerns about her blood sugar levels after experiencing unusual symptoms while consuming chocolate.  Glycemic symptoms - Experienced unusual symptoms described as feeling strange after consuming a large amount of chocolate - Family history of diabetes - No personal history of diabetes - No polyuria, nausea, or vomiting - No concerns about food - Does not make sick after eating  Lower abdominal pain - Sharp pain in the lower abdomen she thinks is related gas cramps or her prior surgery - Postmenopausal status - No prior history of gastrointestinal symptoms    PERTINENT  PMH / PSH: IDA, MDD, Smoker  OBJECTIVE:   BP 121/87   Pulse 66   Ht 5' 1 (1.549 m)   Wt 94 lb 4 oz (42.8 kg)   SpO2 97%   BMI 17.81 kg/m   Physical Exam General: NAD, well appearing Neuro: A&O Respiratory: normal WOB on RA Extremities: Moving all 4 extremities equally Abdomen: soft, non-tender to palpation, rebound or guarding   ASSESSMENT/PLAN:   Assessment & Plan Concern about food or nutrition Screening for metabolic disorder Given patient concern can screen for type 2 diabetes today given family history.  Patient otherwise seems low risk.  No red flag symptoms.  Screen negative for eating disorder.  Notably BMI 17.8. - Counseled regarding healthy diet and nutrition - Recommended to follow-up if she experiences this weird sensation again - A1c normal at 5.4  Precepted with Dr. Orie  Return if symptoms worsen or fail to improve.  Ozell Provencal, MD, PGY-3 [facility] 11:14 AM 08/05/2024  [facility]   "

## 2024-08-05 NOTE — Patient Instructions (Addendum)
 It was great to see you! Thank you for allowing me to participate in your care!  Our plans for today:   VISIT SUMMARY: Today, we discussed your concerns about unusual symptoms after consuming chocolate and sharp lower abdominal pain. We have ordered an A1c test to screen for diabetes due to your family history and symptoms.  YOUR PLAN: GLYCEMIC SYMPTOMS: You experienced unusual symptoms after consuming a large amount of chocolate, and you have a family history of diabetes. -We have ordered an A1c test to screen for diabetes.  LOWER ABDOMINAL PAIN: You have been experiencing sharp pain in your lower abdomen that resembles menstrual cramps, despite being postmenopausal. -Monitor your symptoms and report any changes or worsening of the pain. -If the pain persists or worsens, further evaluation may be necessary.    Please arrive 15 minutes PRIOR to your next scheduled appointment time! If you do not, this affects OTHER patients' care.  Take care and seek immediate care sooner if you develop any concerns.   Ozell Provencal, MD, PGY-3 [facility] 11:14 AM 08/05/2024  [facility]
